# Patient Record
Sex: Male | Born: 1950
Health system: Southern US, Community
[De-identification: ages and names within clinical notes are randomized; demographics above are authoritative.]

## PROBLEM LIST (undated history)

## (undated) DIAGNOSIS — I219 Acute myocardial infarction, unspecified: Secondary | ICD-10-CM

## (undated) DIAGNOSIS — J982 Interstitial emphysema: Secondary | ICD-10-CM

## (undated) DIAGNOSIS — I5022 Chronic systolic (congestive) heart failure: Secondary | ICD-10-CM

## (undated) DIAGNOSIS — E782 Mixed hyperlipidemia: Secondary | ICD-10-CM

## (undated) DIAGNOSIS — J9610 Chronic respiratory failure, unspecified whether with hypoxia or hypercapnia: Secondary | ICD-10-CM

## (undated) DIAGNOSIS — I340 Nonrheumatic mitral (valve) insufficiency: Secondary | ICD-10-CM

## (undated) DIAGNOSIS — I252 Old myocardial infarction: Secondary | ICD-10-CM

## (undated) DIAGNOSIS — E46 Unspecified protein-calorie malnutrition: Secondary | ICD-10-CM

## (undated) DIAGNOSIS — H409 Unspecified glaucoma: Secondary | ICD-10-CM

## (undated) DIAGNOSIS — IMO0001 Reserved for inherently not codable concepts without codable children: Secondary | ICD-10-CM

## (undated) DIAGNOSIS — E871 Hypo-osmolality and hyponatremia: Secondary | ICD-10-CM

## (undated) DIAGNOSIS — C6290 Malignant neoplasm of unspecified testis, unspecified whether descended or undescended: Secondary | ICD-10-CM

## (undated) DIAGNOSIS — I878 Other specified disorders of veins: Secondary | ICD-10-CM

## (undated) DIAGNOSIS — I82409 Acute embolism and thrombosis of unspecified deep veins of unspecified lower extremity: Secondary | ICD-10-CM

## (undated) DIAGNOSIS — J8489 Other specified interstitial pulmonary diseases: Secondary | ICD-10-CM

## (undated) DIAGNOSIS — I2510 Atherosclerotic heart disease of native coronary artery without angina pectoris: Secondary | ICD-10-CM

## (undated) DIAGNOSIS — I700 Atherosclerosis of aorta: Secondary | ICD-10-CM

## (undated) DIAGNOSIS — I517 Cardiomegaly: Secondary | ICD-10-CM

## (undated) DIAGNOSIS — Z8719 Personal history of other diseases of the digestive system: Secondary | ICD-10-CM

## (undated) DIAGNOSIS — J45909 Unspecified asthma, uncomplicated: Secondary | ICD-10-CM

## (undated) DIAGNOSIS — E785 Hyperlipidemia, unspecified: Secondary | ICD-10-CM

## (undated) HISTORY — DX: Atherosclerotic heart disease of native coronary artery without angina pectoris: I25.10

## (undated) HISTORY — DX: Nonrheumatic mitral (valve) insufficiency: I34.0

## (undated) HISTORY — DX: Old myocardial infarction: I25.2

## (undated) HISTORY — PX: CORONARY ARTERY BYPASS GRAFT: SHX141

## (undated) HISTORY — DX: Reserved for inherently not codable concepts without codable children: IMO0001

## (undated) HISTORY — DX: Unspecified glaucoma: H40.9

## (undated) HISTORY — DX: Mixed hyperlipidemia: E78.2

## (undated) HISTORY — DX: Acute myocardial infarction, unspecified: I21.9

## (undated) HISTORY — DX: Atherosclerosis of aorta: I70.0

## (undated) HISTORY — DX: Personal history of other diseases of the digestive system: Z87.19

## (undated) HISTORY — DX: Other specified disorders of veins: I87.8

## (undated) HISTORY — DX: Hyperlipidemia, unspecified: E78.5

## (undated) HISTORY — PX: CHOLECYSTECTOMY: SHX55

## (undated) HISTORY — DX: Cardiomegaly: I51.7

---

## 1986-02-19 HISTORY — PX: VASECTOMY: SHX75

## 1986-09-09 HISTORY — PX: CARDIAC CATHETERIZATION: SHX172

## 2007-12-12 HISTORY — PX: US ECHOCARDIOGRAPHY: HXRAD669

## 2007-12-15 ENCOUNTER — Encounter: Admission: RE | Admit: 2007-12-15 | Discharge: 2007-12-15 | Payer: Self-pay | Admitting: Cardiology

## 2007-12-16 HISTORY — PX: CARDIOVASCULAR STRESS TEST: SHX262

## 2008-12-27 ENCOUNTER — Emergency Department (HOSPITAL_COMMUNITY): Admission: EM | Admit: 2008-12-27 | Discharge: 2008-12-27 | Payer: Self-pay | Admitting: Emergency Medicine

## 2009-07-01 ENCOUNTER — Ambulatory Visit (HOSPITAL_COMMUNITY): Admission: RE | Admit: 2009-07-01 | Discharge: 2009-07-01 | Payer: Self-pay | Admitting: General Surgery

## 2010-05-09 LAB — COMPREHENSIVE METABOLIC PANEL
ALT: 22 U/L (ref 0–53)
Albumin: 3.7 g/dL (ref 3.5–5.2)
CO2: 29 mEq/L (ref 19–32)
Calcium: 8.9 mg/dL (ref 8.4–10.5)
GFR calc Af Amer: >60 mL/min (ref >60)
Total Protein: 7.2 g/dL (ref 6.0–8.3)

## 2010-05-09 LAB — DIFFERENTIAL
Basophils Relative: 1 % (ref 0–1)
Eosinophils Absolute: 0.1 10*3/uL (ref 0.0–0.7)
Lymphs Abs: 1.8 10*3/uL (ref 0.7–4.0)

## 2010-05-09 LAB — CBC
MCV: 86.8 fL (ref 78.0–100.0)
RDW: 14.7 % (ref 11.5–15.5)
WBC: 5 10*3/uL (ref 4.0–10.5)

## 2010-05-24 LAB — COMPREHENSIVE METABOLIC PANEL
ALT: 24 U/L (ref 0–53)
CO2: 26 mEq/L (ref 19–32)
Calcium: 9.3 mg/dL (ref 8.4–10.5)
Chloride: 100 mEq/L (ref 96–112)
GFR calc Af Amer: >60 mL/min (ref >60)
GFR calc non Af Amer: >60 mL/min (ref >60)
Glucose, Bld: 135 mg/dL — ABNORMAL HIGH (ref 70–99)
Potassium: 3.7 mEq/L (ref 3.5–5.1)
Total Bilirubin: 0.7 mg/dL (ref 0.3–1.2)
Total Protein: 7.4 g/dL (ref 6.0–8.3)

## 2010-05-24 LAB — DIFFERENTIAL
Basophils Absolute: 0 10*3/uL (ref 0.0–0.1)
Basophils Relative: 0 % (ref 0–1)
Eosinophils Absolute: 0.1 10*3/uL (ref 0.0–0.7)
Eosinophils Relative: 1 % (ref 0–5)
Lymphocytes Relative: 18 % (ref 12–46)
Lymphs Abs: 1.7 10*3/uL (ref 0.7–4.0)
Monocytes Absolute: 0.6 10*3/uL (ref 0.1–1.0)
Monocytes Relative: 7 % (ref 3–12)
Neutro Abs: 6.8 10*3/uL (ref 1.7–7.7)
Neutrophils Relative %: 74 % (ref 43–77)

## 2010-05-24 LAB — POCT CARDIAC MARKERS
CKMB, poc: 7 ng/mL (ref 1.0–8.0)
Myoglobin, poc: 131 ng/mL (ref 12–200)
Troponin i, poc: <0.05 ng/mL (ref 0.00–0.09)

## 2010-05-24 LAB — CBC
HCT: 37.3 % — ABNORMAL LOW (ref 39.0–52.0)
MCV: 87.5 fL (ref 78.0–100.0)
Platelets: 215 10*3/uL (ref 150–400)
RBC: 4.26 MIL/uL (ref 4.22–5.81)
RDW: 14.6 % (ref 11.5–15.5)
WBC: 9.2 10*3/uL (ref 4.0–10.5)

## 2010-05-24 LAB — LIPASE, BLOOD: Lipase: 23 U/L (ref 11–59)

## 2011-05-10 ENCOUNTER — Encounter: Payer: Self-pay | Admitting: *Deleted

## 2012-07-07 ENCOUNTER — Encounter: Payer: Self-pay | Admitting: Cardiology

## 2012-07-08 ENCOUNTER — Encounter: Payer: Self-pay | Admitting: Cardiology

## 2013-05-15 ENCOUNTER — Institutional Professional Consult (permissible substitution): Payer: Self-pay | Admitting: Cardiovascular Disease

## 2013-05-27 DIAGNOSIS — I517 Cardiomegaly: Secondary | ICD-10-CM | POA: Insufficient documentation

## 2013-05-27 DIAGNOSIS — K8020 Calculus of gallbladder without cholecystitis without obstruction: Secondary | ICD-10-CM

## 2013-05-27 DIAGNOSIS — J45909 Unspecified asthma, uncomplicated: Secondary | ICD-10-CM | POA: Insufficient documentation

## 2013-05-27 DIAGNOSIS — I878 Other specified disorders of veins: Secondary | ICD-10-CM | POA: Insufficient documentation

## 2013-05-27 DIAGNOSIS — E785 Hyperlipidemia, unspecified: Secondary | ICD-10-CM | POA: Insufficient documentation

## 2013-05-27 DIAGNOSIS — E782 Mixed hyperlipidemia: Secondary | ICD-10-CM

## 2013-05-27 DIAGNOSIS — H409 Unspecified glaucoma: Secondary | ICD-10-CM

## 2013-05-27 DIAGNOSIS — I25810 Atherosclerosis of coronary artery bypass graft(s) without angina pectoris: Secondary | ICD-10-CM | POA: Insufficient documentation

## 2013-05-27 DIAGNOSIS — I2109 ST elevation (STEMI) myocardial infarction involving other coronary artery of anterior wall: Secondary | ICD-10-CM | POA: Insufficient documentation

## 2013-06-03 ENCOUNTER — Ambulatory Visit (INDEPENDENT_AMBULATORY_CARE_PROVIDER_SITE_OTHER): Payer: 59 | Admitting: Cardiovascular Disease

## 2013-06-03 ENCOUNTER — Encounter: Payer: Self-pay | Admitting: Cardiovascular Disease

## 2013-06-03 VITALS — BP 110/90 | HR 58 | Ht 69.0 in | Wt 170.1 lb

## 2013-06-03 DIAGNOSIS — I25810 Atherosclerosis of coronary artery bypass graft(s) without angina pectoris: Secondary | ICD-10-CM

## 2013-06-03 DIAGNOSIS — E782 Mixed hyperlipidemia: Secondary | ICD-10-CM

## 2013-06-03 NOTE — Patient Instructions (Signed)
Your physician recommends that you continue on your current medications as directed. Please refer to the Current Medication list given to you today.  Your physician wants you to follow-up in: 1 year with Dr. Nahser.  You will receive a reminder letter in the mail two months in advance. If you don't receive a letter, please call our office to schedule the follow-up appointment.  

## 2013-06-03 NOTE — Assessment & Plan Note (Addendum)
Roberto Ponce is doing OK. No angina.    He had an inferior wall myocardial infarction at age 63. He had coronary artery bypass grafting-bilateral mammary artery grafting at that time. He's done well.  He lost his insurance for a while and therefore did not get medical care for several years. He is now status with Dr. Hulan Fess. He's back on Lipitor and is back eating a good diet. Dr. Rex Kras will continue to monitor his lipids.  I'll see him again in one year.

## 2013-06-03 NOTE — Progress Notes (Signed)
     Roberto Ponce Date of Birth  06-19-1950       [facility] [address] [place], [place], [place]  [address]   [place], [place]  [address] [place]   Fax  [phone]     Fax [phone]  Problem List: 1. Coronary artery disease, CABG ( LIMA to LAD, RIMA to RCA)  - 75 ( age 63) , old Inf. MI prior to CABG.  2. Hyperlipidemia  History of Present Illness:  Roberto Ponce is a 63 yo with hx of CAD, CABG.  He was previously a patient of Dr. Susa Ponce.  He has not had any angina.  Stays on a good diet.   He is now retired .     Current Outpatient Prescriptions on File Prior to Visit  Medication Sig Dispense Refill  . dorzolamide (TRUSOPT) 2 % ophthalmic solution 1 drop 3 (three) times daily.      Marland Kitchen atorvastatin (LIPITOR) 80 MG tablet Take 80 mg by mouth daily.       No current facility-administered medications on file prior to visit.    No Known Allergies  Past Medical History  Diagnosis Date  . Hyperlipidemia   . History of gallstones   . MI, old   . LVH (left ventricular hypertrophy)   . Mitral regurgitation   . Mild aortic sclerosis   . Mixed dyslipidemia   . Coronary artery disease   . MI (myocardial infarction)   . Glaucoma   . Venous stasis     Past Surgical History  Procedure Laterality Date  . Cardiac catheterization  09/09/86    EF 56%  . Coronary artery bypass graft      LIMA TO THE LAD AND RIMA TO RIGHT CORONARY ARTERY  . US echocardiography  12/12/2007    EF 55-60%  . Cardiovascular stress test  12/16/2007    EF 54% NO ISCHEMIA    History  Smoking status  . Never Smoker   Smokeless tobacco  . Not on file    History  Alcohol Use No    Family History  Problem Relation Age of Onset  . Heart attack Father   . Stroke Father     Reviw of Systems:  Reviewed in the HPI.  All other systems are negative.  Physical Exam: Blood pressure 110/90, pulse 58,  height 5\' 9"  (1.753 m), weight 170 lb 1.9 oz (77.166 kg). Wt Readings from Last 3 Encounters:  06/03/13 170 lb 1.9 oz (77.166 kg)     General: Well developed, well nourished, in no acute distress.  Head: Normocephalic, atraumatic, sclera non-icteric, mucus membranes are moist,   Neck: Supple. Carotids are 2 + without bruits. No JVD   Lungs: Clear   Heart: Rr, normal S1, s2  Abdomen: Soft, non-tender, non-distended with normal bowel sounds.  Msk:  Strength and tone are normal   Extremities: No clubbing or cyanosis. No edema.  Distal pedal pulses are 2+ and equal    Neuro: CN II - XII intact.  Alert and oriented X 3.   Psych:  Normal   ECG: June 03, 2013:  Sinus brady at 58,  No ST or T wave changes.   Assessment / Plan:

## 2014-06-21 ENCOUNTER — Ambulatory Visit (INDEPENDENT_AMBULATORY_CARE_PROVIDER_SITE_OTHER): Payer: BLUE CROSS/BLUE SHIELD | Admitting: Cardiovascular Disease

## 2014-06-21 ENCOUNTER — Encounter: Payer: Self-pay | Admitting: Cardiovascular Disease

## 2014-06-21 VITALS — BP 110/78 | HR 70 | Ht 69.0 in | Wt 175.4 lb

## 2014-06-21 DIAGNOSIS — I25810 Atherosclerosis of coronary artery bypass graft(s) without angina pectoris: Secondary | ICD-10-CM

## 2014-06-21 DIAGNOSIS — E782 Mixed hyperlipidemia: Secondary | ICD-10-CM | POA: Diagnosis not present

## 2014-06-21 DIAGNOSIS — I878 Other specified disorders of veins: Secondary | ICD-10-CM | POA: Diagnosis not present

## 2014-06-21 MED ORDER — ASPIRIN EC 81 MG PO TBEC: 81.0000 mg | DELAYED_RELEASE_TABLET | Freq: Every day | ORAL | Status: DC

## 2014-06-21 NOTE — Progress Notes (Signed)
Cardiology Office Note   Date:  06/21/2014   ID:  Roberto Ponce, DOB [birth year], MRN [id number]  PCP:  Gennette Pac, MD  Cardiologist:   Thayer Headings, MD   Chief Complaint  Patient presents with  . Coronary Artery Disease   1. Coronary artery disease, CABG ( LIMA to LAD, RIMA to RCA) - 72 ( age 65) , old Inf. MI prior to CABG.  2. Hyperlipidemia  History of Present Illness:  Roberto Ponce is a 64 yo with hx of CAD, CABG. He was previously a patient of Dr. Susa Simmonds. He has not had any angina. Stays on a good diet. He is now retired .     Jun 21, 2014:    Roberto MCAFFEE is a 64 y.o. male who presents for  His CAD. Stays very active.  Exercises regularly .     Past Medical History  Diagnosis Date  . Hyperlipidemia   . History of gallstones   . MI, old   . LVH (left ventricular hypertrophy)   . Mitral regurgitation   . Mild aortic sclerosis   . Mixed dyslipidemia   . Coronary artery disease   . MI (myocardial infarction)   . Glaucoma   . Venous stasis     Past Surgical History  Procedure Laterality Date  . Cardiac catheterization  09/09/86    EF 56%  . Coronary artery bypass graft      LIMA TO THE LAD AND RIMA TO RIGHT CORONARY ARTERY  . US echocardiography  12/12/2007    EF 55-60%  . Cardiovascular stress test  12/16/2007    EF 54% NO ISCHEMIA     Current Outpatient Prescriptions  Medication Sig Dispense Refill  . aspirin 325 MG tablet Take 325 mg by mouth 3 (three) times a week.     Marland Kitchen atorvastatin (LIPITOR) 80 MG tablet Take 80 mg by mouth daily.    . dorzolamide (TRUSOPT) 2 % ophthalmic solution Place 1 drop into both eyes 2 (two) times daily.     Marland Kitchen latanoprost (XALATAN) 0.005 % ophthalmic solution Place 1 drop into both eyes at bedtime.      No current facility-administered medications for this visit.    Allergies:   Review of patient's allergies indicates no known allergies.    Social History:  The patient  reports that he  has never smoked. He does not have any smokeless tobacco history on file. He reports that he does not drink alcohol or use illicit drugs.   Family History:  The patient's family history includes Heart attack in his father; Stroke in his father.    ROS:  Please see the history of present illness.    Review of Systems: Constitutional:  denies fever, chills, diaphoresis, appetite change and fatigue.  HEENT: denies photophobia, eye pain, redness, hearing loss, ear pain, congestion, sore throat, rhinorrhea, sneezing, neck pain, neck stiffness and tinnitus.  Respiratory: denies SOB, DOE, cough, chest tightness, and wheezing.  Cardiovascular: denies chest pain, palpitations and leg swelling.  Gastrointestinal: denies nausea, vomiting, abdominal pain, diarrhea, constipation, blood in stool.  Genitourinary: denies dysuria, urgency, frequency, hematuria, flank pain and difficulty urinating.  Musculoskeletal: denies  myalgias, back pain, joint swelling, arthralgias and gait problem.   Skin: denies pallor, rash and wound.  Neurological: denies dizziness, seizures, syncope, weakness, light-headedness, numbness and headaches.   Hematological: denies adenopathy, easy bruising, personal or family bleeding history.  Psychiatric/ Behavioral: denies suicidal ideation, mood changes, confusion, nervousness, sleep disturbance and agitation.  All other systems are reviewed and negative.    PHYSICAL EXAM: VS:  BP 110/78 mmHg  Pulse 70  Ht 5\' 9"  (1.753 m)  Wt 175 lb 6.4 oz (79.561 kg)  BMI 25.89 kg/m2 , BMI Body mass index is 25.89 kg/(m^2). GEN: Well nourished, well developed, in no acute distress HEENT: normal Neck: no JVD, carotid bruits, or masses Cardiac: RRR; no murmurs, rubs, or gallops,no edema  Respiratory:  clear to auscultation bilaterally, normal work of breathing GI: soft, nontender, nondistended, + BS MS: no deformity or atrophy Skin: warm and dry, no rash Neuro:  Strength and  sensation are intact Psych: normal   EKG:  EKG is ordered today. The ekg ordered today demonstrates NSR at 70.  Old Inf. MI.  No ST abn   Recent Labs: No results found for requested labs within last 365 days.    Lipid Panel No results found for: CHOL, TRIG, HDL, CHOLHDL, VLDL, LDLCALC, LDLDIRECT    Wt Readings from Last 3 Encounters:  06/21/14 175 lb 6.4 oz (79.561 kg)  06/03/13 170 lb 1.9 oz (77.166 kg)      Other studies Reviewed: Additional studies/ records that were reviewed today include: . Review of the above records demonstrates:    ASSESSMENT AND PLAN:  1. Coronary artery disease, CABG ( LIMA to LAD, RIMA to RCA) - 69 ( age 14) , old Inf. MI prior to CABG. -    He is doing well.  No angina  2. Hyperlipidemia - followed by dr. Rex Kras    Current medicines are reviewed at length with the patient today.  The patient does not have concerns regarding medicines.  The following changes have been made:  no change  Labs/ tests ordered today include:  No orders of the defined types were placed in this encounter.     Disposition:   FU with me in 1 year .      Ponce, Roberto Cheng, MD  06/21/2014 11:02 AM    [facility] [place], [place], [place]  [address] Phone: [phone]; Fax: [phone]   [facility]  [address] [place], [place]  [address] [phone]    Fax [phone]

## 2014-06-21 NOTE — Patient Instructions (Signed)
Medication Instructions:  DECREASE Aspirin to 81 mg once daily  Labwork: None  Testing/Procedures: None  Follow-Up: Your physician wants you to follow-up in: 1 year with Dr. Acie Fredrickson.  You will receive a reminder letter in the mail two months in advance. If you don't receive a letter, please call our office to schedule the follow-up appointment.

## 2014-08-04 ENCOUNTER — Encounter: Payer: Self-pay | Admitting: Cardiovascular Disease

## 2015-03-10 ENCOUNTER — Ambulatory Visit
Admission: RE | Admit: 2015-03-10 | Discharge: 2015-03-10 | Disposition: A | Discharge status: Home / Self Care | Payer: BLUE CROSS/BLUE SHIELD | Source: Ambulatory Visit | Attending: Family Medicine | Admitting: Family Medicine

## 2015-03-10 ENCOUNTER — Ambulatory Visit
Admission: RE | Admit: 2015-03-10 | Discharge: 2015-03-10 | Disposition: A | Discharge status: Home / Self Care | Payer: Self-pay | Source: Ambulatory Visit | Attending: Family Medicine | Admitting: Family Medicine

## 2015-03-10 ENCOUNTER — Other Ambulatory Visit: Payer: Self-pay | Admitting: Family Medicine

## 2015-03-10 DIAGNOSIS — N50811 Right testicular pain: Secondary | ICD-10-CM

## 2015-07-05 ENCOUNTER — Ambulatory Visit (INDEPENDENT_AMBULATORY_CARE_PROVIDER_SITE_OTHER): Payer: BLUE CROSS/BLUE SHIELD | Admitting: Cardiovascular Disease

## 2015-07-05 ENCOUNTER — Encounter: Payer: Self-pay | Admitting: Cardiovascular Disease

## 2015-07-05 VITALS — BP 128/74 | HR 67 | Ht 69.0 in | Wt 177.8 lb

## 2015-07-05 DIAGNOSIS — I25810 Atherosclerosis of coronary artery bypass graft(s) without angina pectoris: Secondary | ICD-10-CM | POA: Diagnosis not present

## 2015-07-05 DIAGNOSIS — E782 Mixed hyperlipidemia: Secondary | ICD-10-CM | POA: Diagnosis not present

## 2015-07-05 NOTE — Progress Notes (Signed)
Cardiology Office Note   Date:  07/05/2015   ID:  [name], DOB 1950/11/26, MRN [id number]  PCP:  Gennette Pac, MD  Cardiologist:   Mertie Moores, MD   Chief Complaint  Patient presents with  . Follow-up  . Coronary Artery Disease   1. Coronary artery disease, CABG ( LIMA to LAD, RIMA to RCA) - 39 ( age 65) , old Inf. MI prior to CABG.  2. Hyperlipidemia  History of Present Illness:  Roberto Ponce is a 65 yo with hx of CAD, CABG. He was previously a patient of Dr. Susa Simmonds. He has not had any angina. Stays on a good diet. He is now retired .     Jun 21, 2014:    Roberto Ponce is a 65 y.o. male who presents for  His CAD. Stays very active.  Exercises regularly .  Jul 05, 2015:  Roberto Ponce is seen today for follow up of his CAD, had CABG in 1988 Garry Heater, MD)  Very active.  Works out on the treadmill regularly . Diet is generally good   No CP or dyspnea.    Past Medical History  Diagnosis Date  . Hyperlipidemia   . History of gallstones   . MI, old   . LVH (left ventricular hypertrophy)   . Mitral regurgitation   . Mild aortic sclerosis ([place])   . Mixed dyslipidemia   . Coronary artery disease   . MI (myocardial infarction) ([place])   . Glaucoma   . Venous stasis     Past Surgical History  Procedure Laterality Date  . Cardiac catheterization  09/09/86    EF 56%  . Coronary artery bypass graft      LIMA TO THE LAD AND RIMA TO RIGHT CORONARY ARTERY  . US echocardiography  12/12/2007    EF 55-60%  . Cardiovascular stress test  12/16/2007    EF 54% NO ISCHEMIA     Current Outpatient Prescriptions  Medication Sig Dispense Refill  . aspirin EC 81 MG tablet Take 1 tablet (81 mg total) by mouth daily.    Marland Kitchen atorvastatin (LIPITOR) 80 MG tablet Take 80 mg by mouth daily.    . dorzolamide (TRUSOPT) 2 % ophthalmic solution Place 1 drop into both eyes 2 (two) times daily.     Marland Kitchen latanoprost (XALATAN) 0.005 % ophthalmic solution Place 1  drop into both eyes at bedtime.     Marland Kitchen oseltamivir (TAMIFLU) 30 MG capsule Take 75 mg by mouth daily.     No current facility-administered medications for this visit.    Allergies:   Review of patient's allergies indicates no known allergies.    Social History:  The patient  reports that he has never smoked. He does not have any smokeless tobacco history on file. He reports that he does not drink alcohol or use illicit drugs.   Family History:  The patient's family history includes Heart attack in his father; Stroke in his father.    ROS:  Please see the history of present illness.    Review of Systems: Constitutional:  denies fever, chills, diaphoresis, appetite change and fatigue.  HEENT: denies photophobia, eye pain, redness, hearing loss, ear pain, congestion, sore throat, rhinorrhea, sneezing, neck pain, neck stiffness and tinnitus.  Respiratory: denies SOB, DOE, cough, chest tightness, and wheezing.  Cardiovascular: denies chest pain, palpitations and leg swelling.  Gastrointestinal: denies nausea, vomiting, abdominal pain, diarrhea, constipation, blood in stool.  Genitourinary: denies dysuria, urgency, frequency, hematuria, flank pain  and difficulty urinating.  Musculoskeletal: denies  myalgias, back pain, joint swelling, arthralgias and gait problem.   Skin: denies pallor, rash and wound.  Neurological: denies dizziness, seizures, syncope, weakness, light-headedness, numbness and headaches.   Hematological: denies adenopathy, easy bruising, personal or family bleeding history.  Psychiatric/ Behavioral: denies suicidal ideation, mood changes, confusion, nervousness, sleep disturbance and agitation.       All other systems are reviewed and negative.    PHYSICAL EXAM: VS:  BP 128/74 mmHg  Pulse 67  Ht 5\' 9"  (1.753 m)  Wt 177 lb 12.8 oz (80.65 kg)  BMI 26.24 kg/m2  SpO2 98% , BMI Body mass index is 26.24 kg/(m^2). GEN: Well nourished, well developed, in no acute  distress HEENT: normal Neck: no JVD, carotid bruits, or masses Cardiac: RRR; no murmurs, rubs, or gallops,no edema  Respiratory:  clear to auscultation bilaterally, normal work of breathing GI: soft, nontender, nondistended, + BS MS: no deformity or atrophy Skin: warm and dry, no rash Neuro:  Strength and sensation are intact Psych: normal   EKG:  EKG is ordered today. The ekg ordered today demonstrates NSR at 70.  Old Inf. MI.  No ST abn   Recent Labs: No results found for requested labs within last 365 days.    Lipid Panel No results found for: CHOL, TRIG, HDL, CHOLHDL, VLDL, LDLCALC, LDLDIRECT    Wt Readings from Last 3 Encounters:  07/05/15 177 lb 12.8 oz (80.65 kg)  06/21/14 175 lb 6.4 oz (79.561 kg)  06/03/13 170 lb 1.9 oz (77.166 kg)      Other studies Reviewed: Additional studies/ records that were reviewed today include: . Review of the above records demonstrates:    ASSESSMENT AND PLAN:  1. Coronary artery disease, CABG ( LIMA to LAD, RIMA to RCA) - 31 ( age 8) , old Inf. MI prior to CABG. -    He is doing well.  No angina  2. Hyperlipidemia - followed by dr. Rex Kras    Current medicines are reviewed at length with the patient today.  The patient does not have concerns regarding medicines.  The following changes have been made:  no change  Labs/ tests ordered today include:  No orders of the defined types were placed in this encounter.     Disposition:   FU with me in 1 year .      Mertie Moores, MD  07/05/2015 11:31 AM    [place], [place], [place]  [address] Phone: [phone]; Fax: [phone]   [facility]  [address] [place], [place]  [address] [phone]    Fax [phone]

## 2015-07-05 NOTE — Patient Instructions (Signed)
Medication Instructions:  Your physician recommends that you continue on your current medications as directed. Please refer to the Current Medication list given to you today.   Labwork: None Ordered   Testing/Procedures: None Ordered   Follow-Up: Your physician wants you to follow-up in: 1 year with Dr. Nahser.  You will receive a reminder letter in the mail two months in advance. If you don't receive a letter, please call our office to schedule the follow-up appointment.   If you need a refill on your cardiac medications before your next appointment, please call your pharmacy.   Thank you for choosing [facility]! Glendy Barsanti, RN [phone]    

## 2015-07-15 NOTE — Addendum Note (Signed)
Addended by: Freada Bergeron on: 07/15/2015 10:25 AM   Modules accepted: Orders

## 2016-04-02 DIAGNOSIS — L308 Other specified dermatitis: Secondary | ICD-10-CM | POA: Diagnosis not present

## 2016-05-21 DIAGNOSIS — H401133 Primary open-angle glaucoma, bilateral, severe stage: Secondary | ICD-10-CM | POA: Diagnosis not present

## 2016-05-21 DIAGNOSIS — H25813 Combined forms of age-related cataract, bilateral: Secondary | ICD-10-CM | POA: Diagnosis not present

## 2016-05-21 DIAGNOSIS — H4423 Degenerative myopia, bilateral: Secondary | ICD-10-CM | POA: Diagnosis not present

## 2016-05-28 DIAGNOSIS — K5730 Diverticulosis of large intestine without perforation or abscess without bleeding: Secondary | ICD-10-CM | POA: Diagnosis not present

## 2016-05-28 DIAGNOSIS — Z1211 Encounter for screening for malignant neoplasm of colon: Secondary | ICD-10-CM | POA: Diagnosis not present

## 2016-06-20 DIAGNOSIS — H2513 Age-related nuclear cataract, bilateral: Secondary | ICD-10-CM | POA: Diagnosis not present

## 2016-06-20 DIAGNOSIS — H401133 Primary open-angle glaucoma, bilateral, severe stage: Secondary | ICD-10-CM | POA: Diagnosis not present

## 2016-07-02 DIAGNOSIS — H401113 Primary open-angle glaucoma, right eye, severe stage: Secondary | ICD-10-CM | POA: Diagnosis not present

## 2016-07-09 ENCOUNTER — Ambulatory Visit (INDEPENDENT_AMBULATORY_CARE_PROVIDER_SITE_OTHER): Payer: PPO | Admitting: Cardiovascular Disease

## 2016-07-09 ENCOUNTER — Encounter: Payer: Self-pay | Admitting: Cardiovascular Disease

## 2016-07-09 ENCOUNTER — Encounter (INDEPENDENT_AMBULATORY_CARE_PROVIDER_SITE_OTHER): Payer: Self-pay

## 2016-07-09 VITALS — BP 108/80 | HR 67 | Ht 69.0 in | Wt 170.4 lb

## 2016-07-09 DIAGNOSIS — E782 Mixed hyperlipidemia: Secondary | ICD-10-CM

## 2016-07-09 DIAGNOSIS — I2510 Atherosclerotic heart disease of native coronary artery without angina pectoris: Secondary | ICD-10-CM | POA: Diagnosis not present

## 2016-07-09 MED ORDER — ROSUVASTATIN CALCIUM 20 MG PO TABS: 20.0000 mg | ORAL_TABLET | Freq: Every day | ORAL | 3 refills | Status: DC

## 2016-07-09 NOTE — Patient Instructions (Addendum)
Medication Instructions:  STOP Atorvastatin (Lipitor) START Rosuvastatin (Crestor) 20 mg once daily   Labwork: None Ordered Please have Dr. Rex Kras fax lab results to Dr. Acie Fredrickson at [phone]   Testing/Procedures: None Ordered   Follow-Up: Your physician wants you to follow-up in: 1 year with Dr. Acie Fredrickson.  You will receive a reminder letter in the mail two months in advance. If you don't receive a letter, please call our office to schedule the follow-up appointment.   If you need a refill on your cardiac medications before your next appointment, please call your pharmacy.   Thank you for choosing [facility]! Christen Bame, RN [phone]

## 2016-07-09 NOTE — Progress Notes (Signed)
Cardiology Office Note   Date:  07/09/2016   ID:  Roberto Ponce, DOB 1950/02/24, MRN [id number]  PCP:  Hulan Fess, MD  Cardiologist:   Mertie Moores, MD   Chief Complaint  Patient presents with  . Coronary Artery Disease   1. Coronary artery disease, CABG ( LIMA to LAD, RIMA to RCA) - 14 ( age 66) , old Inf. MI prior to CABG.  2. Hyperlipidemia  History of Present Illness:  Roberto Ponce is a 66 yo with hx of CAD, CABG. He was previously a patient of Dr. Susa Simmonds. He has not had any angina. Stays on a good diet. He is now retired .     Jun 21, 2014:    Roberto Ponce is a 66 y.o. male who presents for  His CAD. Stays very active.  Exercises regularly .  Jul 05, 2015:  Roberto Ponce is seen today for follow up of his CAD, had CABG in 1988 Garry Heater, MD)  Very active.  Works out on the treadmill regularly . Diet is generally good   No CP or dyspnea.   Jul 09, 2016:  Roberto Ponce is seen today for follow Up of his coronary artery disease. Coronary artery bypass grafting at age 46. Is now retired.  Keeps busy with his 6 grandchildren.   Still exercising regularly , works on the treadmill and "[employer]" Labs have been checked by Hulan Fess, MD   Past Medical History:  Diagnosis Date  . Coronary artery disease   . Glaucoma   . History of gallstones   . Hyperlipidemia   . LVH (left ventricular hypertrophy)   . MI (myocardial infarction) ([place])   . MI, old   . Mild aortic sclerosis ([place])   . Mitral regurgitation   . Mixed dyslipidemia   . Venous stasis     Past Surgical History:  Procedure Laterality Date  . CARDIAC CATHETERIZATION  09/09/86   EF 56%  . CARDIOVASCULAR STRESS TEST  12/16/2007   EF 54% NO ISCHEMIA  . CORONARY ARTERY BYPASS GRAFT     LIMA TO THE LAD AND RIMA TO RIGHT CORONARY ARTERY  . US ECHOCARDIOGRAPHY  12/12/2007   EF 55-60%     Current Outpatient Prescriptions  Medication Sig Dispense Refill  . aspirin EC 81 MG tablet Take  1 tablet (81 mg total) by mouth daily.    Marland Kitchen atorvastatin (LIPITOR) 80 MG tablet Take 80 mg by mouth daily.    . dorzolamide (TRUSOPT) 2 % ophthalmic solution Place 1 drop into both eyes 2 (two) times daily.     Marland Kitchen latanoprost (XALATAN) 0.005 % ophthalmic solution Place 1 drop into both eyes at bedtime.      No current facility-administered medications for this visit.     Allergies:   Patient has no known allergies.    Social History:  The patient  reports that he has never smoked. He has never used smokeless tobacco. He reports that he does not drink alcohol or use drugs.   Family History:  The patient's family history includes Heart attack in his father; Stroke in his father.    ROS:  Please see the history of present illness.    Review of Systems: Constitutional:  denies fever, chills, diaphoresis, appetite change and fatigue.  HEENT: denies photophobia, eye pain, redness, hearing loss, ear pain, congestion, sore throat, rhinorrhea, sneezing, neck pain, neck stiffness and tinnitus.  Respiratory: denies SOB, DOE, cough, chest tightness, and wheezing.  Cardiovascular: denies chest  pain, palpitations and leg swelling.  Gastrointestinal: denies nausea, vomiting, abdominal pain, diarrhea, constipation, blood in stool.  Genitourinary: denies dysuria, urgency, frequency, hematuria, flank pain and difficulty urinating.  Musculoskeletal: denies  myalgias, back pain, joint swelling, arthralgias and gait problem.   Skin: denies pallor, rash and wound.  Neurological: denies dizziness, seizures, syncope, weakness, light-headedness, numbness and headaches.   Hematological: denies adenopathy, easy bruising, personal or family bleeding history.  Psychiatric/ Behavioral: denies suicidal ideation, mood changes, confusion, nervousness, sleep disturbance and agitation.       All other systems are reviewed and negative.    PHYSICAL EXAM: VS:  BP 108/80 (BP Location: Left Arm, Patient Position:  Sitting, Cuff Size: Normal)   Pulse 67   Ht 5\' 9"  (1.753 m)   Wt 170 lb 6.4 oz (77.3 kg)   SpO2 97%   BMI 25.16 kg/m  , BMI Body mass index is 25.16 kg/m. GEN: Well nourished, well developed, in no acute distress  HEENT: normal  Neck: no JVD, carotid bruits, or masses Cardiac: RR; no murmurs, rubs, or gallops,no edema  Respiratory:  Clear  GI:  Soft, + BS  MS: no deformity or atrophy  Skin: no rash Neuro: nonfocal  Psych: normal   EKG:  EKG is ordered today. The ekg ordered today demonstrates NSR at 67.  Old Inf. MI.  No ST abn   Recent Labs: No results found for requested labs within last 8760 hours.    Lipid Panel No results found for: CHOL, TRIG, HDL, CHOLHDL, VLDL, LDLCALC, LDLDIRECT    Wt Readings from Last 3 Encounters:  07/09/16 170 lb 6.4 oz (77.3 kg)  07/05/15 177 lb 12.8 oz (80.6 kg)  06/21/14 175 lb 6.4 oz (79.6 kg)      Other studies Reviewed: Additional studies/ records that were reviewed today include: . Review of the above records demonstrates:    ASSESSMENT AND PLAN:  1. Coronary artery disease, CABG ( LIMA to LAD, RIMA to RCA) - 35 ( age 48) , old Inf. MI prior to CABG. -    He is doing well.  No angina  2. Hyperlipidemia - followed by dr. Rex Kras. His LDL was 105 at his last check in June. Will DC the Atorvastatin and start Rosuvastatin 20 mg a day - this should get him close to his LDL goal of 70. He'll be having labs checked by Dr. Rex Kras in 2 months.    Current medicines are reviewed at length with the patient today.  The patient does not have concerns regarding medicines.  The following changes have been made:  no change  Labs/ tests ordered today include:  No orders of the defined types were placed in this encounter.    Disposition:   FU with me in 1 year .      Mertie Moores, MD  07/09/2016 3:49 PM    [place], [place], [place]  [address] Phone: [phone]; Fax: [phone]

## 2016-08-20 DIAGNOSIS — Z79899 Other long term (current) drug therapy: Secondary | ICD-10-CM | POA: Diagnosis not present

## 2016-08-20 DIAGNOSIS — Z125 Encounter for screening for malignant neoplasm of prostate: Secondary | ICD-10-CM | POA: Diagnosis not present

## 2016-08-20 DIAGNOSIS — E782 Mixed hyperlipidemia: Secondary | ICD-10-CM | POA: Diagnosis not present

## 2016-08-24 DIAGNOSIS — Z23 Encounter for immunization: Secondary | ICD-10-CM | POA: Diagnosis not present

## 2016-08-24 DIAGNOSIS — Z6825 Body mass index (BMI) 25.0-25.9, adult: Secondary | ICD-10-CM | POA: Diagnosis not present

## 2016-08-24 DIAGNOSIS — Z125 Encounter for screening for malignant neoplasm of prostate: Secondary | ICD-10-CM | POA: Diagnosis not present

## 2016-08-24 DIAGNOSIS — N400 Enlarged prostate without lower urinary tract symptoms: Secondary | ICD-10-CM | POA: Diagnosis not present

## 2016-08-24 DIAGNOSIS — E663 Overweight: Secondary | ICD-10-CM | POA: Diagnosis not present

## 2016-08-24 DIAGNOSIS — E782 Mixed hyperlipidemia: Secondary | ICD-10-CM | POA: Diagnosis not present

## 2016-08-24 DIAGNOSIS — H4089 Other specified glaucoma: Secondary | ICD-10-CM | POA: Diagnosis not present

## 2016-08-24 DIAGNOSIS — Z79899 Other long term (current) drug therapy: Secondary | ICD-10-CM | POA: Diagnosis not present

## 2016-08-24 DIAGNOSIS — I2510 Atherosclerotic heart disease of native coronary artery without angina pectoris: Secondary | ICD-10-CM | POA: Diagnosis not present

## 2016-08-24 DIAGNOSIS — Z0000 Encounter for general adult medical examination without abnormal findings: Secondary | ICD-10-CM | POA: Diagnosis not present

## 2016-08-31 ENCOUNTER — Other Ambulatory Visit: Payer: Self-pay

## 2016-08-31 NOTE — Patient Outreach (Signed)
    Patient denies any case management needs at this time. Patient was agreeable to receiving [facility] printed  Through the mail  material describing it's programs.

## 2016-09-25 DIAGNOSIS — I2510 Atherosclerotic heart disease of native coronary artery without angina pectoris: Secondary | ICD-10-CM | POA: Diagnosis not present

## 2016-09-25 DIAGNOSIS — H401113 Primary open-angle glaucoma, right eye, severe stage: Secondary | ICD-10-CM | POA: Diagnosis not present

## 2016-09-25 DIAGNOSIS — I252 Old myocardial infarction: Secondary | ICD-10-CM | POA: Diagnosis not present

## 2016-09-25 DIAGNOSIS — H2511 Age-related nuclear cataract, right eye: Secondary | ICD-10-CM | POA: Diagnosis not present

## 2016-09-25 DIAGNOSIS — H2513 Age-related nuclear cataract, bilateral: Secondary | ICD-10-CM | POA: Diagnosis not present

## 2016-09-25 DIAGNOSIS — Z951 Presence of aortocoronary bypass graft: Secondary | ICD-10-CM | POA: Diagnosis not present

## 2016-09-26 DIAGNOSIS — H401113 Primary open-angle glaucoma, right eye, severe stage: Secondary | ICD-10-CM | POA: Diagnosis not present

## 2016-10-05 DIAGNOSIS — Z79899 Other long term (current) drug therapy: Secondary | ICD-10-CM | POA: Diagnosis not present

## 2016-10-05 DIAGNOSIS — H409 Unspecified glaucoma: Secondary | ICD-10-CM | POA: Diagnosis not present

## 2016-10-05 DIAGNOSIS — H2512 Age-related nuclear cataract, left eye: Secondary | ICD-10-CM | POA: Diagnosis not present

## 2016-10-05 DIAGNOSIS — Z7982 Long term (current) use of aspirin: Secondary | ICD-10-CM | POA: Diagnosis not present

## 2016-10-05 DIAGNOSIS — I252 Old myocardial infarction: Secondary | ICD-10-CM | POA: Diagnosis not present

## 2016-10-05 DIAGNOSIS — H401123 Primary open-angle glaucoma, left eye, severe stage: Secondary | ICD-10-CM | POA: Diagnosis not present

## 2016-10-05 DIAGNOSIS — Z951 Presence of aortocoronary bypass graft: Secondary | ICD-10-CM | POA: Diagnosis not present

## 2016-12-17 DIAGNOSIS — E782 Mixed hyperlipidemia: Secondary | ICD-10-CM | POA: Diagnosis not present

## 2016-12-17 DIAGNOSIS — Z79899 Other long term (current) drug therapy: Secondary | ICD-10-CM | POA: Diagnosis not present

## 2016-12-17 DIAGNOSIS — H4089 Other specified glaucoma: Secondary | ICD-10-CM | POA: Diagnosis not present

## 2016-12-17 DIAGNOSIS — Z125 Encounter for screening for malignant neoplasm of prostate: Secondary | ICD-10-CM | POA: Diagnosis not present

## 2016-12-17 DIAGNOSIS — N400 Enlarged prostate without lower urinary tract symptoms: Secondary | ICD-10-CM | POA: Diagnosis not present

## 2016-12-17 DIAGNOSIS — Z0000 Encounter for general adult medical examination without abnormal findings: Secondary | ICD-10-CM | POA: Diagnosis not present

## 2016-12-17 DIAGNOSIS — I2510 Atherosclerotic heart disease of native coronary artery without angina pectoris: Secondary | ICD-10-CM | POA: Diagnosis not present

## 2017-01-18 DIAGNOSIS — H401113 Primary open-angle glaucoma, right eye, severe stage: Secondary | ICD-10-CM | POA: Diagnosis not present

## 2017-04-01 DIAGNOSIS — H401113 Primary open-angle glaucoma, right eye, severe stage: Secondary | ICD-10-CM | POA: Diagnosis not present

## 2017-04-01 DIAGNOSIS — H401132 Primary open-angle glaucoma, bilateral, moderate stage: Secondary | ICD-10-CM | POA: Diagnosis not present

## 2017-05-22 DIAGNOSIS — Z961 Presence of intraocular lens: Secondary | ICD-10-CM | POA: Diagnosis not present

## 2017-05-22 DIAGNOSIS — H524 Presbyopia: Secondary | ICD-10-CM | POA: Diagnosis not present

## 2017-05-22 DIAGNOSIS — H52223 Regular astigmatism, bilateral: Secondary | ICD-10-CM | POA: Diagnosis not present

## 2017-05-22 DIAGNOSIS — H5201 Hypermetropia, right eye: Secondary | ICD-10-CM | POA: Diagnosis not present

## 2017-05-27 DIAGNOSIS — H401123 Primary open-angle glaucoma, left eye, severe stage: Secondary | ICD-10-CM | POA: Diagnosis not present

## 2017-05-27 DIAGNOSIS — H401113 Primary open-angle glaucoma, right eye, severe stage: Secondary | ICD-10-CM | POA: Diagnosis not present

## 2017-07-09 ENCOUNTER — Encounter: Payer: Self-pay | Admitting: Cardiovascular Disease

## 2017-07-09 ENCOUNTER — Ambulatory Visit: Payer: PPO | Admitting: Cardiovascular Disease

## 2017-07-09 ENCOUNTER — Encounter (INDEPENDENT_AMBULATORY_CARE_PROVIDER_SITE_OTHER): Payer: Self-pay

## 2017-07-09 VITALS — BP 122/85 | HR 67 | Ht 69.0 in | Wt 170.0 lb

## 2017-07-09 DIAGNOSIS — I2510 Atherosclerotic heart disease of native coronary artery without angina pectoris: Secondary | ICD-10-CM | POA: Diagnosis not present

## 2017-07-09 DIAGNOSIS — E782 Mixed hyperlipidemia: Secondary | ICD-10-CM | POA: Diagnosis not present

## 2017-07-09 NOTE — Progress Notes (Signed)
Cardiology Office Note   Date:  07/09/2017   ID:  Roberto Ponce, DOB 09/30/1950, MRN [id number]  PCP:  Hulan Fess, MD  Cardiologist:   Mertie Moores, MD   Chief Complaint  Patient presents with  . Coronary Artery Disease   1. Coronary artery disease, CABG ( LIMA to LAD, RIMA to RCA) - 74 ( age 67) , old Inf. MI prior to CABG.  2. Hyperlipidemia  History of Present Illness:  Roberto Ponce is a 67 yo with hx of CAD, CABG. He was previously a patient of Dr. Susa Simmonds. He has not had any angina. Stays on a good diet. He is now retired .     Jun 21, 2014:    Roberto Ponce is a 67 y.o. male who presents for  His CAD. Stays very active.  Exercises regularly .  Jul 05, 2015:  Roberto Ponce is seen today for follow up of his CAD, had CABG in 1988 Roberto Heater, MD)  Very active.  Works out on the treadmill regularly . Diet is generally good   No CP or dyspnea.   Jul 09, 2016:  Roberto Ponce is seen today for follow Up of his coronary artery disease. Coronary artery bypass grafting at age 67. Is now retired.  Keeps busy with his 6 grandchildren.   Still exercising regularly , works on the treadmill and "total [employer]" Labs have been checked by Hulan Fess, MD   Jul 09, 2017:  Roberto Ponce is seen today for follow up of his CAD . Enjoying retirement . BP is a bit elevated today .   Home readings have also been elevated. Has been eating more salt than he should  Going to the gym - treadmill 4 days a week.    Past Medical History:  Diagnosis Date  . Coronary artery disease   . Glaucoma   . History of gallstones   . Hyperlipidemia   . LVH (left ventricular hypertrophy)   . MI (myocardial infarction) ([place])   . MI, old   . Mild aortic sclerosis ([place])   . Mitral regurgitation   . Mixed dyslipidemia   . Venous stasis     Past Surgical History:  Procedure Laterality Date  . CARDIAC CATHETERIZATION  09/09/86   EF 56%  . CARDIOVASCULAR STRESS TEST  12/16/2007   EF 54% NO  ISCHEMIA  . CORONARY ARTERY BYPASS GRAFT     LIMA TO THE LAD AND RIMA TO RIGHT CORONARY ARTERY  . US ECHOCARDIOGRAPHY  12/12/2007   EF 55-60%     Current Outpatient Medications  Medication Sig Dispense Refill  . aspirin EC 81 MG tablet Take 1 tablet (81 mg total) by mouth daily.    . dorzolamide (TRUSOPT) 2 % ophthalmic solution Place 1 drop into the left eye 2 (two) times daily.     Marland Kitchen latanoprost (XALATAN) 0.005 % ophthalmic solution Place 1 drop into both eyes at bedtime.     . rosuvastatin (CRESTOR) 20 MG tablet Take 1 tablet (20 mg total) by mouth daily. 90 tablet 3   No current facility-administered medications for this visit.     Allergies:   Patient has no known allergies.    Social History:  The patient  reports that he has never smoked. He has never used smokeless tobacco. He reports that he does not drink alcohol or use drugs.   Family History:  The patient's family history includes Heart attack in his father; Stroke in his father.    ROS:  Noted in current history, otherwise review of systems is negative.  Physical Exam: Blood pressure 122/85, pulse 67, height 5\' 9"  (1.753 m), weight 170 lb (77.1 kg), SpO2 97 %.  GEN:  Well nourished, well developed in no acute distress HEENT: Normal NECK: No JVD; No carotid bruits LYMPHATICS: No lymphadenopathy CARDIAC: RRR , no murmurs, rubs, gallops RESPIRATORY:  Clear to auscultation without rales, wheezing or rhonchi  ABDOMEN: Soft, non-tender, non-distended MUSCULOSKELETAL:  No edema; No deformity  SKIN: Warm and dry NEUROLOGIC:  Alert and oriented x 3    EKG:    Jul 09, 2017: Normal sinus rhythm at 67.  Previous inferior wall myocardial infarction.  No changes from previous tracing.  Recent Labs: No results found for requested labs within last 8760 hours.    Lipid Panel No results found for: CHOL, TRIG, HDL, CHOLHDL, VLDL, LDLCALC, LDLDIRECT    Wt Readings from Last 3 Encounters:  07/09/17 170 lb (77.1 kg)    07/09/16 170 lb 6.4 oz (77.3 kg)  07/05/15 177 lb 12.8 oz (80.6 kg)      Other studies Reviewed: Additional studies/ records that were reviewed today include: . Review of the above records demonstrates:    ASSESSMENT AND PLAN:  1. Coronary artery disease, CABG ( LIMA to LAD, RIMA to RCA) - 63 ( age 47) , old Inf. MI prior to CABG. -    No angina .   Works out regularly .     2. Hyperlipidemia -  Has been managed by Dr. Rex Kras  I suggest adding Zetia 10 mg a day or perhaps starting a PCSK9 inhibitor .  His goal LDL is 70.     Current medicines are reviewed at length with the patient today.  The patient does not have concerns regarding medicines.  The following changes have been made:  no change  Labs/ tests ordered today include:   Orders Placed This Encounter  Procedures  . EKG 12-Lead     Disposition:   FU with me in 1 year .      Mertie Moores, MD  07/09/2017 8:32 AM    [address], [place], [place]  [address] Phone: [phone]; Fax: [phone]

## 2017-07-09 NOTE — Patient Instructions (Addendum)
Ask Dr. Rex Kras about adding Zetia 10 mg a day to your current Crestor 20 mg  You will need to get fasting lipid levels, liver enzymes and BMP 3 months after adding Zetia. Your goal LDL is 70 .   Medication Instructions:  Your physician did not prescribe or change any of your medications today. Please refer to the Current Medication list given to you today.   Labwork: None Ordered   Testing/Procedures: None Ordered   Follow-Up: Your physician wants you to follow-up in: 1 year with Dr. Acie Fredrickson. You will receive a reminder letter in the mail two months in advance. If you don't receive a letter, please call our office to schedule the follow-up appointment.   If you need a refill on your cardiac medications before your next appointment, please call your pharmacy.   Thank you for choosing [facility]! Christen Bame, RN [phone]

## 2017-07-10 ENCOUNTER — Telehealth: Payer: Self-pay | Admitting: Nurse Practitioner

## 2017-07-10 DIAGNOSIS — E782 Mixed hyperlipidemia: Secondary | ICD-10-CM

## 2017-07-10 MED ORDER — EZETIMIBE 10 MG PO TABS: 10.0000 mg | ORAL_TABLET | Freq: Every day | ORAL | 3 refills | Status: DC

## 2017-07-10 NOTE — Telephone Encounter (Signed)
Message received from patient requesting #90 Zetia to local pharmacy. Patient is aware to come in August 28 for fasting labs.

## 2017-07-30 ENCOUNTER — Encounter: Payer: Self-pay | Admitting: Cardiovascular Disease

## 2017-08-26 ENCOUNTER — Encounter: Payer: Self-pay | Admitting: Cardiovascular Disease

## 2017-08-26 DIAGNOSIS — Z79899 Other long term (current) drug therapy: Secondary | ICD-10-CM | POA: Diagnosis not present

## 2017-08-26 DIAGNOSIS — E782 Mixed hyperlipidemia: Secondary | ICD-10-CM | POA: Diagnosis not present

## 2017-08-26 DIAGNOSIS — Z125 Encounter for screening for malignant neoplasm of prostate: Secondary | ICD-10-CM | POA: Diagnosis not present

## 2017-08-29 DIAGNOSIS — R011 Cardiac murmur, unspecified: Secondary | ICD-10-CM | POA: Diagnosis not present

## 2017-08-29 DIAGNOSIS — N400 Enlarged prostate without lower urinary tract symptoms: Secondary | ICD-10-CM | POA: Diagnosis not present

## 2017-08-29 DIAGNOSIS — E782 Mixed hyperlipidemia: Secondary | ICD-10-CM | POA: Diagnosis not present

## 2017-08-29 DIAGNOSIS — Z1389 Encounter for screening for other disorder: Secondary | ICD-10-CM | POA: Diagnosis not present

## 2017-08-29 DIAGNOSIS — Z0000 Encounter for general adult medical examination without abnormal findings: Secondary | ICD-10-CM | POA: Diagnosis not present

## 2017-08-29 DIAGNOSIS — Z79899 Other long term (current) drug therapy: Secondary | ICD-10-CM | POA: Diagnosis not present

## 2017-08-29 DIAGNOSIS — Z125 Encounter for screening for malignant neoplasm of prostate: Secondary | ICD-10-CM | POA: Diagnosis not present

## 2017-08-29 DIAGNOSIS — R8290 Unspecified abnormal findings in urine: Secondary | ICD-10-CM | POA: Diagnosis not present

## 2017-08-29 DIAGNOSIS — H4089 Other specified glaucoma: Secondary | ICD-10-CM | POA: Diagnosis not present

## 2017-08-29 DIAGNOSIS — L309 Dermatitis, unspecified: Secondary | ICD-10-CM | POA: Diagnosis not present

## 2017-08-29 DIAGNOSIS — I2510 Atherosclerotic heart disease of native coronary artery without angina pectoris: Secondary | ICD-10-CM | POA: Diagnosis not present

## 2017-10-10 DIAGNOSIS — H401133 Primary open-angle glaucoma, bilateral, severe stage: Secondary | ICD-10-CM | POA: Diagnosis not present

## 2017-10-16 ENCOUNTER — Other Ambulatory Visit: Payer: PPO | Admitting: *Deleted

## 2017-10-16 DIAGNOSIS — E782 Mixed hyperlipidemia: Secondary | ICD-10-CM

## 2017-10-16 LAB — BASIC METABOLIC PANEL
BUN/Creatinine Ratio: 11 (ref 10–24)
BUN: 9 mg/dL (ref 8–27)
CO2: 25 mmol/L (ref 20–29)
Calcium: 9.1 mg/dL (ref 8.6–10.2)
Chloride: 96 mmol/L (ref 96–106)
Creatinine, Ser: 0.83 mg/dL (ref 0.76–1.27)
GFR calc Af Amer: 106 mL/min/{1.73_m2} (ref >59)
GFR, EST NON AFRICAN AMERICAN: 92 mL/min/{1.73_m2} (ref >59)
Glucose: 96 mg/dL (ref 65–99)
POTASSIUM: 4.2 mmol/L (ref 3.5–5.2)
SODIUM: 135 mmol/L (ref 134–144)

## 2017-10-16 LAB — HEPATIC FUNCTION PANEL
ALT: 19 IU/L (ref 0–44)
AST: 24 IU/L (ref 0–40)
Albumin: 4.3 g/dL (ref 3.6–4.8)
Alkaline Phosphatase: 62 IU/L (ref 39–117)
BILIRUBIN TOTAL: 0.4 mg/dL (ref 0.0–1.2)
Bilirubin, Direct: 0.13 mg/dL (ref 0.00–0.40)
Total Protein: 7.2 g/dL (ref 6.0–8.5)

## 2017-10-16 LAB — LIPID PANEL
CHOLESTEROL TOTAL: 129 mg/dL (ref 100–199)
Chol/HDL Ratio: 3.8 ratio (ref 0.0–5.0)
HDL: 34 mg/dL — ABNORMAL LOW (ref >39)
LDL CALC: 80 mg/dL (ref 0–99)
TRIGLYCERIDES: 75 mg/dL (ref 0–149)
VLDL CHOLESTEROL CAL: 15 mg/dL (ref 5–40)

## 2017-10-28 DIAGNOSIS — H401133 Primary open-angle glaucoma, bilateral, severe stage: Secondary | ICD-10-CM | POA: Diagnosis not present

## 2017-11-14 DIAGNOSIS — L249 Irritant contact dermatitis, unspecified cause: Secondary | ICD-10-CM | POA: Diagnosis not present

## 2017-11-14 DIAGNOSIS — B359 Dermatophytosis, unspecified: Secondary | ICD-10-CM | POA: Diagnosis not present

## 2017-11-18 DIAGNOSIS — H401133 Primary open-angle glaucoma, bilateral, severe stage: Secondary | ICD-10-CM | POA: Diagnosis not present

## 2018-02-17 DIAGNOSIS — H401133 Primary open-angle glaucoma, bilateral, severe stage: Secondary | ICD-10-CM | POA: Diagnosis not present

## 2018-02-24 DIAGNOSIS — J0100 Acute maxillary sinusitis, unspecified: Secondary | ICD-10-CM | POA: Diagnosis not present

## 2018-05-19 DIAGNOSIS — H401133 Primary open-angle glaucoma, bilateral, severe stage: Secondary | ICD-10-CM | POA: Diagnosis not present

## 2018-06-14 ENCOUNTER — Other Ambulatory Visit: Payer: Self-pay | Admitting: Cardiovascular Disease

## 2018-06-27 ENCOUNTER — Telehealth: Payer: Self-pay

## 2018-06-27 NOTE — Telephone Encounter (Signed)
Pt has given verbal consent for phone visit for his appt. Pt has been advised to have his BP, HR, and weight ready for his appt.    YOUR CARDIOLOGY TEAM HAS ARRANGED FOR AN E-VISIT FOR YOUR APPOINTMENT - PLEASE REVIEW IMPORTANT INFORMATION BELOW SEVERAL DAYS PRIOR TO YOUR APPOINTMENT  Due to the recent COVID-19 pandemic, we are transitioning in-person office visits to tele-medicine visits in an effort to decrease unnecessary exposure to our patients and staff. Medicare and most insurances are covering these visits without a copay needed. You will need a working email and a smartphone or computer with a camera and microphone. For patients that do not have these items, we can still complete the visit using a telephone but do prefer video when possible. If possible, we also ask that you have a blood pressure cuff and scale at home to measure your blood pressure, heart rate and weight prior to your scheduled appointment. Patients with clinical needs that need an in-person evaluation and testing will still be able to come to the office if absolutely necessary. If you have any questions, feel free to call our office.     DOWNLOADING THE SOFTWARE  Download the [employer] app to enable video and telephone visits with your [facility] Provider.   Instructions for downloading [employer] WebEx: - Go to https://www.webex.com/downloads.html and follow the instructions, or download the app on your smartphone [facility] [employer] Meetings). - If you have technical difficulties with downloading WebEx, please call WebEx at [phone]. - Once the app is downloaded (can be done on either mobile or desktop computer), go to Settings in the upper left hand corner.  Be sure that camera and audio are enabled.  - You will receive an email message with a link to the meeting with a time to join for your tele-health visit.  - Please download the app and have settings configured prior to the appointment time.      2-3 DAYS  BEFORE YOUR APPOINTMENT  One of our staff will call you to confirm that you have been able to set up your WebEx account. We will remind you check your blood pressure, heart rate and weight prior to your scheduled appointment. If you have an Apple Watch or Kardia, please upload any pertinent ECG strips the day before or morning of your appointment to [place]. Our staff will also make sure you have reviewed the consent and agree to move forward with your scheduled tele-health visit.    THE DAY OF YOUR APPOINTMENT  Approximately 15-20 minutes prior to your scheduled appointment, you will receive an e-mail directly from one of our staff member's [email or web address] e-mail accounts inviting you to join a WebEx meeting.  Please do not reply to that email - simply join the [employer].  Upon joining, a member of the office staff will speak with you initially through the WebEx platform to confirm medications, vital signs for the day and any symptoms you may be experiencing.  Please have this information available prior to the time of visit start.      CONSENT FOR TELE-HEALTH VISIT - PLEASE RVIEW  I hereby voluntarily request, consent and authorize [facility] and its employed or contracted physicians, physician assistants, nurse practitioners or other licensed health care professionals (the Practitioner), to provide me with telemedicine health care services (the "Services") as deemed necessary by the treating Practitioner. I acknowledge and consent to receive the Services by the Practitioner via telemedicine. I understand that the telemedicine visit will involve  communicating with the Practitioner through live audiovisual communication technology and the disclosure of certain medical information by electronic transmission. I acknowledge that I have been given the opportunity to request an in-person assessment or other available alternative prior to the telemedicine visit and am voluntarily participating in  the telemedicine visit.  I understand that I have the right to withhold or withdraw my consent to the use of telemedicine in the course of my care at any time, without affecting my right to future care or treatment, and that the Practitioner or I may terminate the telemedicine visit at any time. I understand that I have the right to inspect all information obtained and/or recorded in the course of the telemedicine visit and may receive copies of available information for a reasonable fee.  I understand that some of the potential risks of receiving the Services via telemedicine include:  Marland Kitchen Delay or interruption in medical evaluation due to technological equipment failure or disruption; . Information transmitted may not be sufficient (e.g. poor resolution of images) to allow for appropriate medical decision making by the Practitioner; and/or  . In rare instances, security protocols could fail, causing a breach of personal health information.  Furthermore, I acknowledge that it is my responsibility to provide information about my medical history, conditions and care that is complete and accurate to the best of my ability. I acknowledge that Practitioner's advice, recommendations, and/or decision may be based on factors not within their control, such as incomplete or inaccurate data provided by me or distortions of diagnostic images or specimens that may result from electronic transmissions. I understand that the practice of medicine is not an exact science and that Practitioner makes no warranties or guarantees regarding treatment outcomes. I acknowledge that I will receive a copy of this consent concurrently upon execution via email to the email address I last provided but may also request a printed copy by calling the office of [facility].    I understand that my insurance will be billed for this visit.   I have read or had this consent read to me. . I understand the contents of this consent, which  adequately explains the benefits and risks of the Services being provided via telemedicine.  . I have been provided ample opportunity to ask questions regarding this consent and the Services and have had my questions answered to my satisfaction. . I give my informed consent for the services to be provided through the use of telemedicine in my medical care  By participating in this telemedicine visit I agree to the above.

## 2018-07-09 ENCOUNTER — Other Ambulatory Visit: Payer: Self-pay

## 2018-07-09 ENCOUNTER — Encounter: Payer: Self-pay | Admitting: Cardiovascular Disease

## 2018-07-09 ENCOUNTER — Telehealth (INDEPENDENT_AMBULATORY_CARE_PROVIDER_SITE_OTHER): Payer: PPO | Admitting: Cardiovascular Disease

## 2018-07-09 VITALS — BP 124/74 | HR 65 | Ht 69.5 in | Wt 170.0 lb

## 2018-07-09 DIAGNOSIS — I25810 Atherosclerosis of coronary artery bypass graft(s) without angina pectoris: Secondary | ICD-10-CM

## 2018-07-09 DIAGNOSIS — E782 Mixed hyperlipidemia: Secondary | ICD-10-CM | POA: Diagnosis not present

## 2018-07-09 DIAGNOSIS — Z7189 Other specified counseling: Secondary | ICD-10-CM | POA: Diagnosis not present

## 2018-07-09 NOTE — Patient Instructions (Signed)
Medication Instructions:  Your physician recommends that you continue on your current medications as directed. Please refer to the Current Medication list given to you today.  If you need a refill on your cardiac medications before your next appointment, please call your pharmacy.   Lab work: Your physician recommends that you return for lab work in: May 2021.  Week or so prior to appointment with Dr. Acie Fredrickson.  This will be fasting lab work--BMP, Lipid and liver profiles  If you have labs (blood work) drawn today and your tests are completely normal, you will receive your results only by: Marland Kitchen MyChart Message (if you have MyChart) OR . A paper copy in the mail If you have any lab test that is abnormal or we need to change your treatment, we will call you to review the results.  Testing/Procedures: none  Follow-Up: At [facility], you and your health needs are our priority.  As part of our continuing mission to provide you with exceptional heart care, we have created designated Provider Care Teams.  These Care Teams include your primary Cardiologist (physician) and Advanced Practice Providers (APPs -  Physician Assistants and Nurse Practitioners) who all work together to provide you with the care you need, when you need it. You will need a follow up appointment in:  12 months.  Please call our office 2 months in advance to schedule this appointment.  You may see Mertie Moores, MD or one of the following Advanced Practice Providers on your designated Care Team: Richardson Dopp, PA-C [place], [place] . Daune Perch, NP  Any Other Special Instructions Will Be Listed Below (If Applicable).

## 2018-07-09 NOTE — Progress Notes (Signed)
Virtual Visit via Telephone Note   This visit type was conducted due to national recommendations for restrictions regarding the COVID-19 Pandemic (e.g. social distancing) in an effort to limit this patient's exposure and mitigate transmission in our community.  Due to his co-morbid illnesses, this patient is at least at moderate risk for complications without adequate follow up.  This format is felt to be most appropriate for this patient at this time.  The patient did not have access to video technology/had technical difficulties with video requiring transitioning to audio format only (telephone).  All issues noted in this document were discussed and addressed.  No physical exam could be performed with this format.  Please refer to the patient's chart for his  consent to telehealth for [facility].   Date:  07/09/2018   ID:  Roberto Ponce, DOB 10/02/1950, MRN [id number]  Patient Location: Home Provider Location: Home  PCP:  Hulan Fess, MD  Cardiologist:  Mertie Moores, MD  Electrophysiologist:  None   Evaluation Performed:  Follow-Up Visit  Problem List   1. Coronary artery disease, CABG ( LIMA to LAD, RIMA to RCA) - 31 ( age 88) , old Inf. MI prior to CABG.  2. Hyperlipidemia   Roberto Ponce is a 68 yo with hx of CAD, CABG. He was previously a patient of Dr. Susa Simmonds. He has not had any angina. Stays on a good diet. He is now retired .     Jun 21, 2014:    Roberto Ponce is a 68 y.o. male who presents for  His CAD. Stays very active.  Exercises regularly .  Jul 05, 2015:  Roberto Ponce is seen today for follow up of his CAD, had CABG in 1988 Roberto Heater, MD)  Very active.  Works out on the treadmill regularly . Diet is generally good   No CP or dyspnea.   Jul 09, 2016:  Roberto Ponce is seen today for follow Up of his coronary artery disease. Coronary artery bypass grafting at age 36. Is now retired.  Keeps busy with his 6 grandchildren.   Still  exercising regularly , works on the treadmill and "total Gym" Labs have been checked by Hulan Fess, MD   Jul 09, 2017:  Roberto Ponce is seen today for follow up of his CAD . Enjoying retirement . BP is a bit elevated today .   Home readings have also been elevated. Has been eating more salt than he should  Going to the gym - treadmill 4 days a week.     May 20 , 2020    Chief Complaint:  Follow up CAD    Roberto Ponce is a 68 y.o. male with  CAD.  Doing well.  No angina  Bought a new treadmill last week.   Still working out regularly .  No CP or dyspnea, no syncope No  N,V,D. No signs of symptoms of covid   Had URI in Nov. 2019.   Is better now  Last lab work rom Aug, 2019 shows  LDL of 80,   Total chol = 129 HDL = 34 Trigs = 75    Wears a mask when he goes out.  Social distancing  The patient does not have symptoms concerning for COVID-19 infection (fever, chills, cough, or new shortness of breath).    Past Medical History:  Diagnosis Date  . Coronary artery disease   . Glaucoma   . History of gallstones   . Hyperlipidemia   . LVH (  left ventricular hypertrophy)   . MI (myocardial infarction) ([place])   . MI, old   . Mild aortic sclerosis ([place])   . Mitral regurgitation   . Mixed dyslipidemia   . Venous stasis    Past Surgical History:  Procedure Laterality Date  . CARDIAC CATHETERIZATION  09/09/86   EF 56%  . CARDIOVASCULAR STRESS TEST  12/16/2007   EF 54% NO ISCHEMIA  . CORONARY ARTERY BYPASS GRAFT     LIMA TO THE LAD AND RIMA TO RIGHT CORONARY ARTERY  . US ECHOCARDIOGRAPHY  12/12/2007   EF 55-60%     Current Meds  Medication Sig  . aspirin EC 81 MG tablet Take 1 tablet (81 mg total) by mouth daily.  . dorzolamide (TRUSOPT) 2 % ophthalmic solution Place 1 drop into both eyes 2 (two) times daily.   Marland Kitchen ezetimibe (ZETIA) 10 MG tablet TAKE 1 TABLET(10 MG) BY MOUTH DAILY  . latanoprost (XALATAN) 0.005 % ophthalmic solution Place 1 drop into both eyes at  bedtime.   . rosuvastatin (CRESTOR) 20 MG tablet Take 1 tablet (20 mg total) by mouth daily.  Marland Kitchen triamcinolone cream (KENALOG) 0.1 % Apply 1 application topically as needed.     Allergies:   Patient has no known allergies.   Social History   Tobacco Use  . Smoking status: Never Smoker  . Smokeless tobacco: Never Used  Substance Use Topics  . Alcohol use: No  . Drug use: No     Family Hx: The patient's family history includes Heart attack in his father; Stroke in his father.  ROS:   Please see the history of present illness.     All other systems reviewed and are negative.   Prior CV studies:   The following studies were reviewed today:    Labs/Other Tests and Data Reviewed:    EKG:  No ECG reviewed.  Recent Labs: 10/16/2017: ALT 19; BUN 9; Creatinine, Ser 0.83; Potassium 4.2; Sodium 135   Recent Lipid Panel Lab Results  Component Value Date/Time   CHOL 129 10/16/2017 07:42 AM   TRIG 75 10/16/2017 07:42 AM   HDL 34 (L) 10/16/2017 07:42 AM   CHOLHDL 3.8 10/16/2017 07:42 AM   LDLCALC 80 10/16/2017 07:42 AM    Wt Readings from Last 3 Encounters:  07/09/18 170 lb (77.1 kg)  07/09/17 170 lb (77.1 kg)  07/09/16 170 lb 6.4 oz (77.3 kg)     Objective:    Vital Signs:  BP 124/74 (BP Location: Left Arm, Patient Position: Sitting, Cuff Size: Normal)   Pulse 65   Ht 5' 9.5" (1.765 m)   Wt 170 lb (77.1 kg)   BMI 24.74 kg/m    No exam except for VS   ASSESSMENT & PLAN:    1.  CAD:  Doing well,  Exercising regularly .   Tries to eat well.   No angina, no dyspnea or syncope  2.   Hyperlipidemia:    Cont crestor and fenofibrate.  Last labs look great.   COVID-19 Education: The signs and symptoms of COVID-19 were discussed with the patient and how to seek care for testing (follow up with PCP or arrange E-visit).  The importance of social distancing was discussed today.  Time:   Today, I have spent  20  minutes with the patient with telehealth technology  discussing the above problems.     Medication Adjustments/Labs and Tests Ordered: Current medicines are reviewed at length with the patient today.  Concerns regarding medicines  are outlined above.   Tests Ordered: No orders of the defined types were placed in this encounter.   Medication Changes: No orders of the defined types were placed in this encounter.   Disposition:  Follow up in 1 year(s)  Signed, Mertie Moores, MD  07/09/2018 9:47 AM    [facility]

## 2018-08-14 DIAGNOSIS — H401133 Primary open-angle glaucoma, bilateral, severe stage: Secondary | ICD-10-CM | POA: Diagnosis not present

## 2018-08-25 DIAGNOSIS — Z79899 Other long term (current) drug therapy: Secondary | ICD-10-CM | POA: Diagnosis not present

## 2018-08-25 DIAGNOSIS — Z125 Encounter for screening for malignant neoplasm of prostate: Secondary | ICD-10-CM | POA: Diagnosis not present

## 2018-08-25 DIAGNOSIS — E782 Mixed hyperlipidemia: Secondary | ICD-10-CM | POA: Diagnosis not present

## 2018-09-05 DIAGNOSIS — Z0000 Encounter for general adult medical examination without abnormal findings: Secondary | ICD-10-CM | POA: Diagnosis not present

## 2018-09-10 DIAGNOSIS — E782 Mixed hyperlipidemia: Secondary | ICD-10-CM | POA: Diagnosis not present

## 2018-09-10 DIAGNOSIS — I2510 Atherosclerotic heart disease of native coronary artery without angina pectoris: Secondary | ICD-10-CM | POA: Diagnosis not present

## 2018-09-10 DIAGNOSIS — E871 Hypo-osmolality and hyponatremia: Secondary | ICD-10-CM | POA: Diagnosis not present

## 2018-09-10 DIAGNOSIS — N400 Enlarged prostate without lower urinary tract symptoms: Secondary | ICD-10-CM | POA: Diagnosis not present

## 2018-09-10 DIAGNOSIS — H4089 Other specified glaucoma: Secondary | ICD-10-CM | POA: Diagnosis not present

## 2018-09-10 DIAGNOSIS — L309 Dermatitis, unspecified: Secondary | ICD-10-CM | POA: Diagnosis not present

## 2018-10-08 DIAGNOSIS — E782 Mixed hyperlipidemia: Secondary | ICD-10-CM | POA: Diagnosis not present

## 2018-10-08 DIAGNOSIS — H4089 Other specified glaucoma: Secondary | ICD-10-CM | POA: Diagnosis not present

## 2018-10-08 DIAGNOSIS — E871 Hypo-osmolality and hyponatremia: Secondary | ICD-10-CM | POA: Diagnosis not present

## 2018-10-08 DIAGNOSIS — L309 Dermatitis, unspecified: Secondary | ICD-10-CM | POA: Diagnosis not present

## 2018-10-08 DIAGNOSIS — I2510 Atherosclerotic heart disease of native coronary artery without angina pectoris: Secondary | ICD-10-CM | POA: Diagnosis not present

## 2018-10-08 DIAGNOSIS — N400 Enlarged prostate without lower urinary tract symptoms: Secondary | ICD-10-CM | POA: Diagnosis not present

## 2018-12-12 ENCOUNTER — Other Ambulatory Visit: Payer: Self-pay | Admitting: Cardiovascular Disease

## 2018-12-17 DIAGNOSIS — H401133 Primary open-angle glaucoma, bilateral, severe stage: Secondary | ICD-10-CM | POA: Diagnosis not present

## 2019-04-16 ENCOUNTER — Other Ambulatory Visit: Payer: Self-pay | Admitting: Pharmacist

## 2019-04-16 DIAGNOSIS — E782 Mixed hyperlipidemia: Secondary | ICD-10-CM | POA: Diagnosis not present

## 2019-04-16 DIAGNOSIS — N400 Enlarged prostate without lower urinary tract symptoms: Secondary | ICD-10-CM | POA: Diagnosis not present

## 2019-04-16 DIAGNOSIS — H4089 Other specified glaucoma: Secondary | ICD-10-CM | POA: Diagnosis not present

## 2019-04-16 DIAGNOSIS — I2510 Atherosclerotic heart disease of native coronary artery without angina pectoris: Secondary | ICD-10-CM | POA: Diagnosis not present

## 2019-04-16 MED ORDER — EZETIMIBE 10 MG PO TABS: ORAL_TABLET | ORAL | 1 refills | Status: DC

## 2019-04-24 DIAGNOSIS — H401133 Primary open-angle glaucoma, bilateral, severe stage: Secondary | ICD-10-CM | POA: Diagnosis not present

## 2019-06-10 DIAGNOSIS — B029 Zoster without complications: Secondary | ICD-10-CM | POA: Diagnosis not present

## 2019-06-11 DIAGNOSIS — H401133 Primary open-angle glaucoma, bilateral, severe stage: Secondary | ICD-10-CM | POA: Diagnosis not present

## 2019-06-17 DIAGNOSIS — N400 Enlarged prostate without lower urinary tract symptoms: Secondary | ICD-10-CM | POA: Diagnosis not present

## 2019-06-17 DIAGNOSIS — E782 Mixed hyperlipidemia: Secondary | ICD-10-CM | POA: Diagnosis not present

## 2019-06-17 DIAGNOSIS — H4089 Other specified glaucoma: Secondary | ICD-10-CM | POA: Diagnosis not present

## 2019-06-17 DIAGNOSIS — I2510 Atherosclerotic heart disease of native coronary artery without angina pectoris: Secondary | ICD-10-CM | POA: Diagnosis not present

## 2019-07-09 ENCOUNTER — Other Ambulatory Visit: Payer: Self-pay

## 2019-07-09 ENCOUNTER — Other Ambulatory Visit: Payer: PPO | Admitting: *Deleted

## 2019-07-09 DIAGNOSIS — I25810 Atherosclerosis of coronary artery bypass graft(s) without angina pectoris: Secondary | ICD-10-CM

## 2019-07-09 DIAGNOSIS — E782 Mixed hyperlipidemia: Secondary | ICD-10-CM | POA: Diagnosis not present

## 2019-07-09 LAB — HEPATIC FUNCTION PANEL
ALT: 27 IU/L (ref 0–44)
AST: 34 IU/L (ref 0–40)
Albumin: 4.5 g/dL (ref 3.8–4.8)
Alkaline Phosphatase: 68 IU/L (ref 48–121)
Bilirubin Total: 0.3 mg/dL (ref 0.0–1.2)
Bilirubin, Direct: 0.11 mg/dL (ref 0.00–0.40)
Total Protein: 7.6 g/dL (ref 6.0–8.5)

## 2019-07-09 LAB — BASIC METABOLIC PANEL
BUN/Creatinine Ratio: 12 (ref 10–24)
BUN: 10 mg/dL (ref 8–27)
CO2: 23 mmol/L (ref 20–29)
Calcium: 9.4 mg/dL (ref 8.6–10.2)
Chloride: 97 mmol/L (ref 96–106)
Creatinine, Ser: 0.85 mg/dL (ref 0.76–1.27)
GFR calc Af Amer: 103 mL/min/{1.73_m2} (ref >59)
GFR calc non Af Amer: 90 mL/min/{1.73_m2} (ref >59)
Glucose: 97 mg/dL (ref 65–99)
Potassium: 4.3 mmol/L (ref 3.5–5.2)
Sodium: 136 mmol/L (ref 134–144)

## 2019-07-09 LAB — LIPID PANEL
Chol/HDL Ratio: 3.7 ratio (ref 0.0–5.0)
Cholesterol, Total: 133 mg/dL (ref 100–199)
HDL: 36 mg/dL — ABNORMAL LOW (ref >39)
LDL Chol Calc (NIH): 83 mg/dL (ref 0–99)
Triglycerides: 67 mg/dL (ref 0–149)
VLDL Cholesterol Cal: 14 mg/dL (ref 5–40)

## 2019-07-13 ENCOUNTER — Encounter: Payer: Self-pay | Admitting: Cardiovascular Disease

## 2019-07-13 ENCOUNTER — Ambulatory Visit: Payer: PPO | Admitting: Cardiovascular Disease

## 2019-07-13 ENCOUNTER — Other Ambulatory Visit: Payer: Self-pay

## 2019-07-13 VITALS — BP 134/82 | HR 62 | Ht 69.0 in | Wt 171.4 lb

## 2019-07-13 DIAGNOSIS — I25810 Atherosclerosis of coronary artery bypass graft(s) without angina pectoris: Secondary | ICD-10-CM

## 2019-07-13 DIAGNOSIS — I2510 Atherosclerotic heart disease of native coronary artery without angina pectoris: Secondary | ICD-10-CM | POA: Diagnosis not present

## 2019-07-13 DIAGNOSIS — E782 Mixed hyperlipidemia: Secondary | ICD-10-CM | POA: Diagnosis not present

## 2019-07-13 NOTE — Progress Notes (Signed)
Cardiology Office Note   Date:  07/13/2019   ID:  Roberto Ponce, DOB Dec 30, 1950, MRN [id number]  PCP:  Hulan Fess, MD  Cardiologist:   Mertie Moores, MD   Chief Complaint  Patient presents with  . Coronary Artery Disease  . Hyperlipidemia   1. Coronary artery disease, CABG ( LIMA to LAD, RIMA to RCA) - 68 ( age 69) , old Inf. MI prior to CABG.  2. Hyperlipidemia  History of Present Illness:  Roberto Ponce is a 69 yo with hx of CAD, CABG. He was previously a patient of Dr. Susa Simmonds. He has not had any angina. Stays on a good diet. He is now retired .     Jun 21, 2014:    Roberto Ponce is a 69 y.o. male who presents for  His CAD. Stays very active.  Exercises regularly .  Jul 05, 2015:  Roberto Ponce is seen today for follow up of his CAD, had CABG in 1988 Roberto Heater, MD)  Very active.  Works out on the treadmill regularly . Diet is generally good   No CP or dyspnea.   Jul 09, 2016:  Roberto Ponce is seen today for follow Up of his coronary artery disease. Coronary artery bypass grafting at age 36. Is now retired.  Keeps busy with his 6 grandchildren.   Still exercising regularly , works on the treadmill and "total [employer]" Labs have been checked by Hulan Fess, MD   Jul 09, 2017:  Roberto Ponce is seen today for follow up of his CAD . Enjoying retirement . BP is a bit elevated today .   Home readings have also been elevated. Has been eating more salt than he should  Going to the gym - treadmill 4 days a week.    Jul 13, 2019: Roberto Ponce is seen today for follow-up of his coronary artery disease. Had shingles last month.  Feeling better now.  Is back exercising .  No CP or dyspnea Labs were reviewed..  Chol levels look ok . LDL is 83.   On rosuvastatin 40 and zetia 10 mg a day   Past Medical History:  Diagnosis Date  . Coronary artery disease   . Glaucoma   . History of gallstones   . Hyperlipidemia   . LVH (left ventricular hypertrophy)   . MI (myocardial  infarction) ([place])   . MI, old   . Mild aortic sclerosis   . Mitral regurgitation   . Mixed dyslipidemia   . Venous stasis     Past Surgical History:  Procedure Laterality Date  . CARDIAC CATHETERIZATION  09/09/86   EF 56%  . CARDIOVASCULAR STRESS TEST  12/16/2007   EF 54% NO ISCHEMIA  . CORONARY ARTERY BYPASS GRAFT     LIMA TO THE LAD AND RIMA TO RIGHT CORONARY ARTERY  . US ECHOCARDIOGRAPHY  12/12/2007   EF 55-60%     Current Outpatient Medications  Medication Sig Dispense Refill  . aspirin EC 81 MG tablet Take 1 tablet (81 mg total) by mouth daily.    . dorzolamide (TRUSOPT) 2 % ophthalmic solution Place 1 drop into both eyes 2 (two) times daily.     Marland Kitchen ezetimibe (ZETIA) 10 MG tablet TAKE 1 TABLET(10 MG) BY MOUTH DAILY 90 tablet 1  . latanoprost (XALATAN) 0.005 % ophthalmic solution Place 1 drop into both eyes at bedtime.     . rosuvastatin (CRESTOR) 40 MG tablet Take 40 mg by mouth at bedtime.    . triamcinolone cream (  KENALOG) 0.1 % Apply 1 application topically as needed.     No current facility-administered medications for this visit.    Allergies:   Patient has no known allergies.    Social History:  The patient  reports that he has never smoked. He has never used smokeless tobacco. He reports that he does not drink alcohol or use drugs.   Family History:  The patient's family history includes Heart attack in his father; Stroke in his father.    ROS:   Noted in current history, otherwise review of systems is negative.  Physical Exam: Blood pressure 134/82, pulse 62, height 5\' 9"  (1.753 m), weight 171 lb 6.4 oz (77.7 kg), SpO2 97 %.  GEN:  Well nourished, well developed in no acute distress HEENT: Normal NECK: No JVD; No carotid bruits LYMPHATICS: No lymphadenopathy CARDIAC: RR,  Brief, mid systolic murmur  RESPIRATORY:  Clear to auscultation without rales, wheezing or rhonchi  ABDOMEN: Soft, non-tender, non-distended MUSCULOSKELETAL:  No edema; No deformity   SKIN: Warm and dry NEUROLOGIC:  Alert and oriented x 3    EKG:    Normal sinus rhythm at 62.  Old inferior wall myocardial infarction.  No acute ST or T wave changes.  Recent Labs: 07/09/2019: ALT 27; BUN 10; Creatinine, Ser 0.85; Potassium 4.3; Sodium 136    Lipid Panel    Component Value Date/Time   CHOL 133 07/09/2019 0730   TRIG 67 07/09/2019 0730   HDL 36 (L) 07/09/2019 0730   CHOLHDL 3.7 07/09/2019 0730   LDLCALC 83 07/09/2019 0730      Wt Readings from Last 3 Encounters:  07/13/19 171 lb 6.4 oz (77.7 kg)  07/09/18 170 lb (77.1 kg)  07/09/17 170 lb (77.1 kg)      Other studies Reviewed: Additional studies/ records that were reviewed today include: . Review of the above records demonstrates:    ASSESSMENT AND PLAN:  1. Coronary artery disease, CABG ( LIMA to LAD, RIMA to RCA) - 68 ( age 69) ,  . No angina   on rosuvastatin and zetia . Exercises without any angina    2. Hyperlipidemia -  On rosuvastatin 40 mg a day , Zetia 10 mg a day . LDL is 83.   He will continue to exercise and watch his diet    Current medicines are reviewed at length with the patient today.  The patient does not have concerns regarding medicines.  The following changes have been made:  no change  Labs/ tests ordered today include:   Orders Placed This Encounter  Procedures  . Lipid Profile  . Hepatic function panel  . Basic metabolic panel  . EKG 12-Lead     Disposition:   FU with me in 1 year .      Mertie Moores, MD  07/13/2019 8:27 AM    [facility] [place], [place], [place]  [address] Phone: [phone]; Fax: [phone]

## 2019-07-13 NOTE — Patient Instructions (Signed)
Medication Instructions:    Your physician recommends that you continue on your current medications as directed. Please refer to the Current Medication list given to you today.  *If you need a refill on your cardiac medications before your next appointment, please call your pharmacy*   Lab Work:  IN ONE YEAR, SAME DAY AS YOUR ONE YEAR FOLLOW-UP APPOINTMENT WITH DR. Jetty Duhamel WILL CHECK LIPIDS, LFTs, AND A BMET--PLEASE COME FASTING TO THIS LAB APPOINTMENT.  If you have labs (blood work) drawn today and your tests are completely normal, you will receive your results only by: Marland Kitchen MyChart Message (if you have MyChart) OR . A paper copy in the mail If you have any lab test that is abnormal or we need to change your treatment, we will call you to review the results.   Follow-Up: At [facility], you and your health needs are our priority.  As part of our continuing mission to provide you with exceptional heart care, we have created designated Provider Care Teams.  These Care Teams include your primary Cardiologist (physician) and Advanced Practice Providers (APPs -  Physician Assistants and Nurse Practitioners) who all work together to provide you with the care you need, when you need it.  We recommend signing up for the patient portal called "MyChart".  Sign up information is provided on this After Visit Summary.  MyChart is used to connect with patients for Virtual Visits (Telemedicine).  Patients are able to view lab/test results, encounter notes, upcoming appointments, etc.  Non-urgent messages can be sent to your provider as well.   To learn more about what you can do with MyChart, go to [email or web address].    Your next appointment:   12 month(s)--WITH LABS SAME DAY  The format for your next appointment:   In Person  Provider:   Mertie Moores, MD

## 2019-07-16 DIAGNOSIS — E782 Mixed hyperlipidemia: Secondary | ICD-10-CM | POA: Diagnosis not present

## 2019-07-16 DIAGNOSIS — H4089 Other specified glaucoma: Secondary | ICD-10-CM | POA: Diagnosis not present

## 2019-07-16 DIAGNOSIS — N400 Enlarged prostate without lower urinary tract symptoms: Secondary | ICD-10-CM | POA: Diagnosis not present

## 2019-07-16 DIAGNOSIS — I2510 Atherosclerotic heart disease of native coronary artery without angina pectoris: Secondary | ICD-10-CM | POA: Diagnosis not present

## 2019-08-07 DIAGNOSIS — I2510 Atherosclerotic heart disease of native coronary artery without angina pectoris: Secondary | ICD-10-CM | POA: Diagnosis not present

## 2019-08-07 DIAGNOSIS — N400 Enlarged prostate without lower urinary tract symptoms: Secondary | ICD-10-CM | POA: Diagnosis not present

## 2019-08-07 DIAGNOSIS — H4089 Other specified glaucoma: Secondary | ICD-10-CM | POA: Diagnosis not present

## 2019-08-07 DIAGNOSIS — E782 Mixed hyperlipidemia: Secondary | ICD-10-CM | POA: Diagnosis not present

## 2019-09-08 DIAGNOSIS — E782 Mixed hyperlipidemia: Secondary | ICD-10-CM | POA: Diagnosis not present

## 2019-09-08 DIAGNOSIS — H4089 Other specified glaucoma: Secondary | ICD-10-CM | POA: Diagnosis not present

## 2019-09-08 DIAGNOSIS — N400 Enlarged prostate without lower urinary tract symptoms: Secondary | ICD-10-CM | POA: Diagnosis not present

## 2019-09-08 DIAGNOSIS — I2510 Atherosclerotic heart disease of native coronary artery without angina pectoris: Secondary | ICD-10-CM | POA: Diagnosis not present

## 2019-09-11 DIAGNOSIS — Z1159 Encounter for screening for other viral diseases: Secondary | ICD-10-CM | POA: Diagnosis not present

## 2019-09-11 DIAGNOSIS — E782 Mixed hyperlipidemia: Secondary | ICD-10-CM | POA: Diagnosis not present

## 2019-09-11 DIAGNOSIS — Z79899 Other long term (current) drug therapy: Secondary | ICD-10-CM | POA: Diagnosis not present

## 2019-09-11 DIAGNOSIS — Z125 Encounter for screening for malignant neoplasm of prostate: Secondary | ICD-10-CM | POA: Diagnosis not present

## 2019-09-16 DIAGNOSIS — H4089 Other specified glaucoma: Secondary | ICD-10-CM | POA: Diagnosis not present

## 2019-09-16 DIAGNOSIS — L309 Dermatitis, unspecified: Secondary | ICD-10-CM | POA: Diagnosis not present

## 2019-09-16 DIAGNOSIS — E782 Mixed hyperlipidemia: Secondary | ICD-10-CM | POA: Diagnosis not present

## 2019-09-16 DIAGNOSIS — Z1159 Encounter for screening for other viral diseases: Secondary | ICD-10-CM | POA: Diagnosis not present

## 2019-09-16 DIAGNOSIS — N400 Enlarged prostate without lower urinary tract symptoms: Secondary | ICD-10-CM | POA: Diagnosis not present

## 2019-09-16 DIAGNOSIS — I2510 Atherosclerotic heart disease of native coronary artery without angina pectoris: Secondary | ICD-10-CM | POA: Diagnosis not present

## 2019-09-16 DIAGNOSIS — R8290 Unspecified abnormal findings in urine: Secondary | ICD-10-CM | POA: Diagnosis not present

## 2019-09-16 DIAGNOSIS — D649 Anemia, unspecified: Secondary | ICD-10-CM | POA: Diagnosis not present

## 2019-09-16 DIAGNOSIS — E871 Hypo-osmolality and hyponatremia: Secondary | ICD-10-CM | POA: Diagnosis not present

## 2019-09-16 DIAGNOSIS — Z125 Encounter for screening for malignant neoplasm of prostate: Secondary | ICD-10-CM | POA: Diagnosis not present

## 2019-09-16 DIAGNOSIS — Z79899 Other long term (current) drug therapy: Secondary | ICD-10-CM | POA: Diagnosis not present

## 2019-09-16 DIAGNOSIS — Z0000 Encounter for general adult medical examination without abnormal findings: Secondary | ICD-10-CM | POA: Diagnosis not present

## 2019-09-28 DIAGNOSIS — D649 Anemia, unspecified: Secondary | ICD-10-CM | POA: Diagnosis not present

## 2019-10-06 DIAGNOSIS — E782 Mixed hyperlipidemia: Secondary | ICD-10-CM | POA: Diagnosis not present

## 2019-10-06 DIAGNOSIS — N400 Enlarged prostate without lower urinary tract symptoms: Secondary | ICD-10-CM | POA: Diagnosis not present

## 2019-10-06 DIAGNOSIS — I2510 Atherosclerotic heart disease of native coronary artery without angina pectoris: Secondary | ICD-10-CM | POA: Diagnosis not present

## 2019-10-06 DIAGNOSIS — H4089 Other specified glaucoma: Secondary | ICD-10-CM | POA: Diagnosis not present

## 2019-10-19 DIAGNOSIS — Z79899 Other long term (current) drug therapy: Secondary | ICD-10-CM | POA: Diagnosis not present

## 2019-10-19 DIAGNOSIS — L309 Dermatitis, unspecified: Secondary | ICD-10-CM | POA: Diagnosis not present

## 2019-10-19 DIAGNOSIS — D649 Anemia, unspecified: Secondary | ICD-10-CM | POA: Diagnosis not present

## 2019-10-19 DIAGNOSIS — E871 Hypo-osmolality and hyponatremia: Secondary | ICD-10-CM | POA: Diagnosis not present

## 2019-10-19 DIAGNOSIS — E782 Mixed hyperlipidemia: Secondary | ICD-10-CM | POA: Diagnosis not present

## 2019-10-19 DIAGNOSIS — R8290 Unspecified abnormal findings in urine: Secondary | ICD-10-CM | POA: Diagnosis not present

## 2019-10-19 DIAGNOSIS — Z125 Encounter for screening for malignant neoplasm of prostate: Secondary | ICD-10-CM | POA: Diagnosis not present

## 2019-10-19 DIAGNOSIS — Z1159 Encounter for screening for other viral diseases: Secondary | ICD-10-CM | POA: Diagnosis not present

## 2019-10-19 DIAGNOSIS — I2510 Atherosclerotic heart disease of native coronary artery without angina pectoris: Secondary | ICD-10-CM | POA: Diagnosis not present

## 2019-10-19 DIAGNOSIS — N400 Enlarged prostate without lower urinary tract symptoms: Secondary | ICD-10-CM | POA: Diagnosis not present

## 2019-10-19 DIAGNOSIS — H4089 Other specified glaucoma: Secondary | ICD-10-CM | POA: Diagnosis not present

## 2019-11-02 DIAGNOSIS — H401132 Primary open-angle glaucoma, bilateral, moderate stage: Secondary | ICD-10-CM | POA: Diagnosis not present

## 2019-11-03 DIAGNOSIS — E782 Mixed hyperlipidemia: Secondary | ICD-10-CM | POA: Diagnosis not present

## 2019-11-03 DIAGNOSIS — N400 Enlarged prostate without lower urinary tract symptoms: Secondary | ICD-10-CM | POA: Diagnosis not present

## 2019-11-03 DIAGNOSIS — H4089 Other specified glaucoma: Secondary | ICD-10-CM | POA: Diagnosis not present

## 2019-11-03 DIAGNOSIS — I2510 Atherosclerotic heart disease of native coronary artery without angina pectoris: Secondary | ICD-10-CM | POA: Diagnosis not present

## 2019-12-14 DIAGNOSIS — N400 Enlarged prostate without lower urinary tract symptoms: Secondary | ICD-10-CM | POA: Diagnosis not present

## 2019-12-14 DIAGNOSIS — I2510 Atherosclerotic heart disease of native coronary artery without angina pectoris: Secondary | ICD-10-CM | POA: Diagnosis not present

## 2019-12-14 DIAGNOSIS — H4089 Other specified glaucoma: Secondary | ICD-10-CM | POA: Diagnosis not present

## 2019-12-14 DIAGNOSIS — E782 Mixed hyperlipidemia: Secondary | ICD-10-CM | POA: Diagnosis not present

## 2019-12-18 DIAGNOSIS — D649 Anemia, unspecified: Secondary | ICD-10-CM | POA: Diagnosis not present

## 2019-12-22 DIAGNOSIS — I2510 Atherosclerotic heart disease of native coronary artery without angina pectoris: Secondary | ICD-10-CM | POA: Diagnosis not present

## 2019-12-22 DIAGNOSIS — E782 Mixed hyperlipidemia: Secondary | ICD-10-CM | POA: Diagnosis not present

## 2019-12-22 DIAGNOSIS — N400 Enlarged prostate without lower urinary tract symptoms: Secondary | ICD-10-CM | POA: Diagnosis not present

## 2019-12-22 DIAGNOSIS — H4089 Other specified glaucoma: Secondary | ICD-10-CM | POA: Diagnosis not present

## 2020-02-11 DIAGNOSIS — H401132 Primary open-angle glaucoma, bilateral, moderate stage: Secondary | ICD-10-CM | POA: Diagnosis not present

## 2020-02-17 DIAGNOSIS — D649 Anemia, unspecified: Secondary | ICD-10-CM | POA: Diagnosis not present

## 2020-03-07 DIAGNOSIS — E782 Mixed hyperlipidemia: Secondary | ICD-10-CM | POA: Diagnosis not present

## 2020-03-07 DIAGNOSIS — I2510 Atherosclerotic heart disease of native coronary artery without angina pectoris: Secondary | ICD-10-CM | POA: Diagnosis not present

## 2020-03-07 DIAGNOSIS — N400 Enlarged prostate without lower urinary tract symptoms: Secondary | ICD-10-CM | POA: Diagnosis not present

## 2020-03-07 DIAGNOSIS — H4089 Other specified glaucoma: Secondary | ICD-10-CM | POA: Diagnosis not present

## 2020-03-08 DIAGNOSIS — R059 Cough, unspecified: Secondary | ICD-10-CM | POA: Diagnosis not present

## 2020-03-08 DIAGNOSIS — B349 Viral infection, unspecified: Secondary | ICD-10-CM | POA: Diagnosis not present

## 2020-03-08 DIAGNOSIS — R0981 Nasal congestion: Secondary | ICD-10-CM | POA: Diagnosis not present

## 2020-03-08 DIAGNOSIS — Z20822 Contact with and (suspected) exposure to covid-19: Secondary | ICD-10-CM | POA: Diagnosis not present

## 2020-04-21 DIAGNOSIS — D649 Anemia, unspecified: Secondary | ICD-10-CM | POA: Diagnosis not present

## 2020-06-16 ENCOUNTER — Other Ambulatory Visit: Payer: Self-pay

## 2020-06-16 ENCOUNTER — Other Ambulatory Visit: Payer: PPO

## 2020-06-16 DIAGNOSIS — E782 Mixed hyperlipidemia: Secondary | ICD-10-CM

## 2020-06-16 DIAGNOSIS — I25810 Atherosclerosis of coronary artery bypass graft(s) without angina pectoris: Secondary | ICD-10-CM | POA: Diagnosis not present

## 2020-06-16 LAB — BASIC METABOLIC PANEL
BUN/Creatinine Ratio: 10 (ref 10–24)
BUN: 9 mg/dL (ref 8–27)
CO2: 25 mmol/L (ref 20–29)
Calcium: 9.1 mg/dL (ref 8.6–10.2)
Chloride: 95 mmol/L — ABNORMAL LOW (ref 96–106)
Creatinine, Ser: 0.87 mg/dL (ref 0.76–1.27)
Glucose: 95 mg/dL (ref 65–99)
Potassium: 4.7 mmol/L (ref 3.5–5.2)
Sodium: 133 mmol/L — ABNORMAL LOW (ref 134–144)
eGFR: 93 mL/min/{1.73_m2} (ref >59)

## 2020-06-16 LAB — LIPID PANEL
Chol/HDL Ratio: 4 ratio (ref 0.0–5.0)
Cholesterol, Total: 136 mg/dL (ref 100–199)
HDL: 34 mg/dL — ABNORMAL LOW (ref >39)
LDL Chol Calc (NIH): 86 mg/dL (ref 0–99)
Triglycerides: 82 mg/dL (ref 0–149)
VLDL Cholesterol Cal: 16 mg/dL (ref 5–40)

## 2020-06-16 LAB — HEPATIC FUNCTION PANEL
ALT: 20 IU/L (ref 0–44)
AST: 27 IU/L (ref 0–40)
Albumin: 4.4 g/dL (ref 3.8–4.8)
Alkaline Phosphatase: 72 IU/L (ref 44–121)
Bilirubin Total: 0.5 mg/dL (ref 0.0–1.2)
Bilirubin, Direct: 0.15 mg/dL (ref 0.00–0.40)
Total Protein: 7.6 g/dL (ref 6.0–8.5)

## 2020-06-20 DIAGNOSIS — N400 Enlarged prostate without lower urinary tract symptoms: Secondary | ICD-10-CM | POA: Diagnosis not present

## 2020-06-20 DIAGNOSIS — H4089 Other specified glaucoma: Secondary | ICD-10-CM | POA: Diagnosis not present

## 2020-06-20 DIAGNOSIS — I2510 Atherosclerotic heart disease of native coronary artery without angina pectoris: Secondary | ICD-10-CM | POA: Diagnosis not present

## 2020-06-20 DIAGNOSIS — E782 Mixed hyperlipidemia: Secondary | ICD-10-CM | POA: Diagnosis not present

## 2020-07-14 DIAGNOSIS — H401132 Primary open-angle glaucoma, bilateral, moderate stage: Secondary | ICD-10-CM | POA: Diagnosis not present

## 2020-07-21 ENCOUNTER — Encounter: Payer: Self-pay | Admitting: Cardiovascular Disease

## 2020-07-21 NOTE — Progress Notes (Signed)
Cardiology Office Note   Date:  07/22/2020   ID:  Roberto Ponce, DOB Jan 28, 1951, MRN [id number]  PCP:  Kristen Loader, FNP  Cardiologist:   Mertie Moores, MD   Chief Complaint  Patient presents with  . Coronary Artery Disease  . Hyperlipidemia   1. Coronary artery disease, CABG ( LIMA to LAD, RIMA to RCA) - 70 ( age 70) , old Inf. MI prior to CABG.  2. Hyperlipidemia  Previous notes:   Roberto Ponce is a 70 yo with hx of CAD, CABG. He was previously a patient of Dr. Susa Simmonds. He has not had any angina. Stays on a good diet. He is now retired .     Jun 21, 2014:    Roberto Ponce is a 70 y.o. male who presents for  His CAD. Stays very active.  Exercises regularly .  Jul 05, 2015:  Roberto Ponce is seen today for follow up of his CAD, had CABG in 1988 Roberto Heater, MD)  Very active.  Works out on the treadmill regularly . Diet is generally good   No CP or dyspnea.   Jul 09, 2016:  Roberto Ponce is seen today for follow Up of his coronary artery disease. Coronary artery bypass grafting at age 42. Is now retired.  Keeps busy with his 6 grandchildren.   Still exercising regularly , works on the treadmill and "total [employer]" Labs have been checked by Hulan Fess, MD   Jul 09, 2017:  Roberto Ponce is seen today for follow up of his CAD . Enjoying retirement . BP is a bit elevated today .   Home readings have also been elevated. Has been eating more salt than he should  Going to the gym - treadmill 4 days a week.    Jul 13, 2019: Roberto Ponce is seen today for follow-up of his coronary artery disease. Had shingles last month.  Feeling better now.  Is back exercising .  No CP or dyspnea Labs were reviewed..  Chol levels look ok . LDL is 83.   On rosuvastatin 40 and zetia 10 mg a day   July 22, 2020: Roberto Ponce is seen today for follow up of his CAD. S/p CABG  Had covid in January. Is gradually getting stronger .  No CP or angina    Past Medical History:  Diagnosis Date  .  Coronary artery disease   . Glaucoma   . History of gallstones   . Hyperlipidemia   . LVH (left ventricular hypertrophy)   . MI (myocardial infarction) ([place])   . MI, old   . Mild aortic sclerosis   . Mitral regurgitation   . Mixed dyslipidemia   . Venous stasis     Past Surgical History:  Procedure Laterality Date  . CARDIAC CATHETERIZATION  09/09/86   EF 56%  . CARDIOVASCULAR STRESS TEST  12/16/2007   EF 54% NO ISCHEMIA  . CORONARY ARTERY BYPASS GRAFT     LIMA TO THE LAD AND RIMA TO RIGHT CORONARY ARTERY  . US ECHOCARDIOGRAPHY  12/12/2007   EF 55-60%     Current Outpatient Medications  Medication Sig Dispense Refill  . aspirin EC 81 MG tablet Take 1 tablet (81 mg total) by mouth daily.    . dorzolamide (TRUSOPT) 2 % ophthalmic solution Place 1 drop into both eyes 2 (two) times daily.     Marland Kitchen ezetimibe (ZETIA) 10 MG tablet TAKE 1 TABLET(10 MG) BY MOUTH DAILY 90 tablet 1  . latanoprost (XALATAN) 0.005 %  ophthalmic solution Place 1 drop into both eyes at bedtime.     . rosuvastatin (CRESTOR) 40 MG tablet Take 40 mg by mouth at bedtime.    . triamcinolone cream (KENALOG) 0.1 % Apply 1 application topically as needed.     No current facility-administered medications for this visit.    Allergies:   Patient has no known allergies.    Social History:  The patient  reports that he has never smoked. He has never used smokeless tobacco. He reports that he does not drink alcohol and does not use drugs.   Family History:  The patient's family history includes Heart attack in his father; Stroke in his father.    ROS:   Noted in current history, otherwise review of systems is negative.   Physical Exam: Blood pressure 128/74, pulse 67, height 5\' 11"  (1.803 m), weight 171 lb (77.6 kg), SpO2 98 %.  GEN:  Well nourished, well developed in no acute distress HEENT: Normal NECK: No JVD; No carotid bruits LYMPHATICS: No lymphadenopathy CARDIAC: RRR 2/6 late systolic murmur   RESPIRATORY:  Clear to auscultation without rales, wheezing or rhonchi  ABDOMEN: Soft, non-tender, non-distended MUSCULOSKELETAL:  No edema; No deformity  SKIN: Warm and dry NEUROLOGIC:  Alert and oriented x 3     EKG:   July 22, 2020: Normal sinus rhythm at 67.  No ST or T wave changes.   Recent Labs: 06/16/2020: ALT 20; BUN 9; Creatinine, Ser 0.87; Potassium 4.7; Sodium 133    Lipid Panel    Component Value Date/Time   CHOL 136 06/16/2020 0954   TRIG 82 06/16/2020 0954   HDL 34 (L) 06/16/2020 0954   CHOLHDL 4.0 06/16/2020 0954   LDLCALC 86 06/16/2020 0954      Wt Readings from Last 3 Encounters:  07/22/20 171 lb (77.6 kg)  07/13/19 171 lb 6.4 oz (77.7 kg)  07/09/18 170 lb (77.1 kg)      Other studies Reviewed: Additional studies/ records that were reviewed today include: . Review of the above records demonstrates:    ASSESSMENT AND PLAN:  1. Coronary artery disease, CABG ( LIMA to LAD, RIMA to RCA) - 70 ( age 70) ,  .     2. Hyperlipidemia -    3.  MVP :  Has a late peaking systolic murmur Previous echo in 2009.  Will repeat echo. Has has had more dyspnea since Jan.   Thinks is covid but may be worsening  MR  Will have him follow up with Dr. Gasper Sells in a year.     Current medicines are reviewed at length with the patient today.  The patient does not have concerns regarding medicines.  The following changes have been made:  no change  Labs/ tests ordered today include:   No orders of the defined types were placed in this encounter.    Disposition:        Mertie Moores, MD  07/22/2020 8:30 AM    [facility] [address], [place], [place]  [address] Phone: [phone]; Fax: [phone]

## 2020-07-22 ENCOUNTER — Other Ambulatory Visit: Payer: Self-pay

## 2020-07-22 ENCOUNTER — Encounter: Payer: Self-pay | Admitting: Cardiovascular Disease

## 2020-07-22 ENCOUNTER — Ambulatory Visit: Payer: PPO | Admitting: Cardiovascular Disease

## 2020-07-22 VITALS — BP 128/74 | HR 67 | Ht 71.0 in | Wt 171.0 lb

## 2020-07-22 DIAGNOSIS — I341 Nonrheumatic mitral (valve) prolapse: Secondary | ICD-10-CM | POA: Diagnosis not present

## 2020-07-22 DIAGNOSIS — I25810 Atherosclerosis of coronary artery bypass graft(s) without angina pectoris: Secondary | ICD-10-CM

## 2020-07-22 DIAGNOSIS — I340 Nonrheumatic mitral (valve) insufficiency: Secondary | ICD-10-CM | POA: Diagnosis not present

## 2020-07-22 NOTE — Patient Instructions (Signed)
Medication Instructions:  Your physician recommends that you continue on your current medications as directed. Please refer to the Current Medication list given to you today.  *If you need a refill on your cardiac medications before your next appointment, please call your pharmacy*   Lab Work: None If you have labs (blood work) drawn today and your tests are completely normal, you will receive your results only by: Marland Kitchen MyChart Message (if you have MyChart) OR . A paper copy in the mail If you have any lab test that is abnormal or we need to change your treatment, we will call you to review the results.   Testing/Procedures: Your physician has requested that you have an echocardiogram. Echocardiography is a painless test that uses sound waves to create images of your heart. It provides your doctor with information about the size and shape of your heart and how well your heart's chambers and valves are working. This procedure takes approximately one hour. There are no restrictions for this procedure.    Follow-Up: At [facility], you and your health needs are our priority.  As part of our continuing mission to provide you with exceptional heart care, we have created designated Provider Care Teams.  These Care Teams include your primary Cardiologist (physician) and Advanced Practice Providers (APPs -  Physician Assistants and Nurse Practitioners) who all work together to provide you with the care you need, when you need it.  We recommend signing up for the patient portal called "MyChart".  Sign up information is provided on this After Visit Summary.  MyChart is used to connect with patients for Virtual Visits (Telemedicine).  Patients are able to view lab/test results, encounter notes, upcoming appointments, etc.  Non-urgent messages can be sent to your provider as well.   To learn more about what you can do with MyChart, go to [email or web address].    Your next appointment:   1  year(s)  The format for your next appointment:   In Person  Provider:   You may see Rudean Haskell, MD or one of the following Advanced Practice Providers on your designated Care Team:    Melina Copa, PA-C  Ermalinda Barrios, PA-C    Other Instructions

## 2020-08-17 ENCOUNTER — Other Ambulatory Visit: Payer: Self-pay

## 2020-08-17 ENCOUNTER — Ambulatory Visit (HOSPITAL_COMMUNITY): Payer: PPO | Attending: Cardiovascular Disease

## 2020-08-17 DIAGNOSIS — I341 Nonrheumatic mitral (valve) prolapse: Secondary | ICD-10-CM | POA: Diagnosis not present

## 2020-08-17 LAB — ECHOCARDIOGRAM COMPLETE
Area-P 1/2: 5.02 cm2
MV M vel: 5.11 m/s
MV Peak grad: 104.4 mmHg
Radius: 0.6 cm
S' Lateral: 3.1 cm

## 2020-08-19 ENCOUNTER — Telehealth: Payer: Self-pay

## 2020-08-19 NOTE — Telephone Encounter (Signed)
-----   Message from Thayer Headings, MD sent at 08/18/2020  1:28 PM EDT ----- Pt has a hx of MVP.  The MR appears to be severe on this echo.   Normal LV function. He is scheduled to see Dr. Gasper Sells in a year but Dr. Debara Pickett recommends a TEE for further evaluation of his worsening MR. Lavone Barrientes, would you see if we can get him scheduled for a TEE on one of Dr. Oralia Rud Reader B days .   Depending on the TEE results, he may been earlier follow up.  Thanks

## 2020-08-21 NOTE — Addendum Note (Signed)
Addended by: Rudean Haskell A on: 08/21/2020 03:01 PM   Modules accepted: Orders, SmartSet

## 2020-09-02 ENCOUNTER — Encounter (HOSPITAL_COMMUNITY)
Admission: RE | Disposition: A | Discharge status: Home / Self Care | Payer: Self-pay | Source: Home / Self Care | Attending: Internal Medicine

## 2020-09-02 ENCOUNTER — Ambulatory Visit (HOSPITAL_COMMUNITY): Payer: PPO | Admitting: Anesthesiology

## 2020-09-02 ENCOUNTER — Ambulatory Visit (HOSPITAL_COMMUNITY)
Admission: RE | Admit: 2020-09-02 | Discharge: 2020-09-02 | Disposition: A | Discharge status: Home / Self Care | Payer: PPO | Attending: Internal Medicine | Admitting: Internal Medicine

## 2020-09-02 ENCOUNTER — Ambulatory Visit (HOSPITAL_BASED_OUTPATIENT_CLINIC_OR_DEPARTMENT_OTHER)
Admission: RE | Admit: 2020-09-02 | Discharge: 2020-09-02 | Disposition: A | Discharge status: Home / Self Care | Payer: PPO | Source: Home / Self Care | Attending: Internal Medicine | Admitting: Internal Medicine

## 2020-09-02 ENCOUNTER — Other Ambulatory Visit: Payer: Self-pay

## 2020-09-02 ENCOUNTER — Encounter (HOSPITAL_COMMUNITY): Payer: Self-pay | Admitting: Internal Medicine

## 2020-09-02 DIAGNOSIS — I340 Nonrheumatic mitral (valve) insufficiency: Secondary | ICD-10-CM

## 2020-09-02 DIAGNOSIS — I252 Old myocardial infarction: Secondary | ICD-10-CM | POA: Diagnosis not present

## 2020-09-02 DIAGNOSIS — Z951 Presence of aortocoronary bypass graft: Secondary | ICD-10-CM | POA: Diagnosis not present

## 2020-09-02 DIAGNOSIS — E782 Mixed hyperlipidemia: Secondary | ICD-10-CM | POA: Diagnosis not present

## 2020-09-02 DIAGNOSIS — I2510 Atherosclerotic heart disease of native coronary artery without angina pectoris: Secondary | ICD-10-CM | POA: Diagnosis not present

## 2020-09-02 DIAGNOSIS — Z79899 Other long term (current) drug therapy: Secondary | ICD-10-CM | POA: Diagnosis not present

## 2020-09-02 DIAGNOSIS — Z7982 Long term (current) use of aspirin: Secondary | ICD-10-CM | POA: Diagnosis not present

## 2020-09-02 DIAGNOSIS — I081 Rheumatic disorders of both mitral and tricuspid valves: Secondary | ICD-10-CM | POA: Diagnosis not present

## 2020-09-02 HISTORY — PX: TEE WITHOUT CARDIOVERSION: SHX5443

## 2020-09-02 LAB — ECHO TEE
Area-P 1/2: 1.76 cm2
MV VTI: 0.91 cm2

## 2020-09-02 SURGERY — ECHOCARDIOGRAM, TRANSESOPHAGEAL
Anesthesia: Monitor Anesthesia Care

## 2020-09-02 MED ORDER — PROPOFOL 10 MG/ML IV BOLUS
INTRAVENOUS | Status: DC | PRN
  Administered 2020-09-02: 30 mg via INTRAVENOUS

## 2020-09-02 MED ORDER — LIDOCAINE 2% (20 MG/ML) 5 ML SYRINGE
INTRAMUSCULAR | Status: DC | PRN
  Administered 2020-09-02: 40 mg via INTRAVENOUS

## 2020-09-02 MED ORDER — EPHEDRINE SULFATE-NACL 50-0.9 MG/10ML-% IV SOSY
PREFILLED_SYRINGE | INTRAVENOUS | Status: DC | PRN
  Administered 2020-09-02 (×2): 10 mg via INTRAVENOUS

## 2020-09-02 MED ORDER — PROPOFOL 500 MG/50ML IV EMUL
INTRAVENOUS | Status: DC | PRN
  Administered 2020-09-02: 125 ug/kg/min via INTRAVENOUS

## 2020-09-02 MED ORDER — SODIUM CHLORIDE 0.9 % IV SOLN: INTRAVENOUS | Status: DC

## 2020-09-02 NOTE — Transfer of Care (Signed)
Immediate Anesthesia Transfer of Care Note  Patient: Roberto Ponce  Procedure(s) Performed: TRANSESOPHAGEAL ECHOCARDIOGRAM (TEE)  Patient Location: Endoscopy Unit  Anesthesia Type:MAC  Level of Consciousness: drowsy  Airway & Oxygen Therapy: Patient Spontanous Breathing  Post-op Assessment: Report given to RN and Post -op Vital signs reviewed and stable  Post vital signs: Reviewed and stable  Last Vitals:  Vitals Value Taken Time  BP 100/55 09/02/20 0922  Temp    Pulse 66 09/02/20 0923  Resp 15 09/02/20 0923  SpO2 95 % 09/02/20 0923  Vitals shown include unvalidated device data.  Last Pain:  Vitals:   09/02/20 0922  TempSrc:   PainSc: Asleep         Complications: No notable events documented.

## 2020-09-02 NOTE — H&P (Signed)
Cardiology H&P Note:    Date:  09/02/2020   ID:  Roberto Ponce, DOB 05-03-1950, MRN [id number]  PCP:  Kristen Loader, FNP   [facility] Cardiologist:  Mertie Moores, MD    CC: MR  History of Present Illness:    Roberto Ponce is a 70 y.o. male with a hx of CAD s/p CABG who has MVP and presents with worsened MR.  Patient notes that he is doing well.  Since last visit notes no changes.  No chest pain or pressure .  No SOB/DOE and no PND/Orthopnea.  No weight gain or leg swelling.  No palpitations or syncope .   Past Medical History:  Diagnosis Date   Coronary artery disease    Glaucoma    History of gallstones    Hyperlipidemia    LVH (left ventricular hypertrophy)    MI (myocardial infarction) ([facility])    MI, old    Mild aortic sclerosis    Mitral regurgitation    Mixed dyslipidemia    Venous stasis     Past Surgical History:  Procedure Laterality Date   CARDIAC CATHETERIZATION  09/09/86   EF 56%   CARDIOVASCULAR STRESS TEST  12/16/2007   EF 54% NO ISCHEMIA   CORONARY ARTERY BYPASS GRAFT     LIMA TO THE LAD AND RIMA TO RIGHT CORONARY ARTERY   US ECHOCARDIOGRAPHY  12/12/2007   EF 55-60%    Current Medications: Current Meds  Medication Sig   aspirin EC 81 MG tablet Take 1 tablet (81 mg total) by mouth daily. (Patient taking differently: Take 81 mg by mouth at bedtime.)   dorzolamide-timolol (COSOPT) 22.3-6.8 MG/ML ophthalmic solution Place 1 drop into both eyes 2 (two) times daily.   ezetimibe (ZETIA) 10 MG tablet TAKE 1 TABLET(10 MG) BY MOUTH DAILY (Patient taking differently: Take 10 mg by mouth at bedtime.)   ferrous sulfate 325 (65 FE) MG tablet Take 325 mg by mouth once a week.   latanoprost (XALATAN) 0.005 % ophthalmic solution Place 1 drop into both eyes at bedtime.    rosuvastatin (CRESTOR) 40 MG tablet Take 40 mg by mouth at bedtime.   triamcinolone cream (KENALOG) 0.1 % Apply 1 application topically 2 (two) times daily as needed  (eczema).     Allergies:   Patient has no known allergies.   Social History   Socioeconomic History   Marital status: Married    Spouse name: Not on file   Number of children: Not on file   Years of education: Not on file   Highest education level: Not on file  Occupational History   Not on file  Tobacco Use   Smoking status: Never   Smokeless tobacco: Never  Vaping Use   Vaping Use: Never used  Substance and Sexual Activity   Alcohol use: No   Drug use: No   Sexual activity: Not on file  Other Topics Concern   Not on file  Social History Narrative   Not on file   Social Determinants of Health   Financial Resource Strain: Not on file  Food Insecurity: Not on file  Transportation Needs: Not on file  Physical Activity: Not on file  Stress: Not on file  Social Connections: Not on file     Family History: The patient's family history includes Heart attack in his father; Stroke in his father.  ROS:   Please see the history of present illness.     All other systems reviewed and  are negative.  EKGs/Labs/Other Studies Reviewed:    The following studies were reviewed today:   Recent Labs: 06/16/2020: ALT 20; BUN 9; Creatinine, Ser 0.87; Potassium 4.7; Sodium 133  Recent Lipid Panel    Component Value Date/Time   CHOL 136 06/16/2020 0954   TRIG 82 06/16/2020 0954   HDL 34 (L) 06/16/2020 0954   CHOLHDL 4.0 06/16/2020 0954   LDLCALC 86 06/16/2020 0954   Physical Exam:    VS:  BP 128/71   Pulse 73   Temp 98.3 F (36.8 C) (Oral)   Resp (!) 24   Ht 5\' 11"  (1.803 m)   Wt 75.8 kg   SpO2 96%   BMI 23.29 kg/m     Wt Readings from Last 3 Encounters:  09/02/20 75.8 kg  07/22/20 77.6 kg  07/13/19 77.7 kg     GEN:  Well nourished, well developed in no acute distress HEENT: Normal NECK: No JVD; No carotid bruits LYMPHATICS: No lymphadenopathy CARDIAC: RRR, no rubs, gallops; systolic murmur RESPIRATORY:  Clear to auscultation without rales, wheezing or  rhonchi  ABDOMEN: Soft, non-tender, non-distended MUSCULOSKELETAL:  No edema; No deformity  SKIN: Warm and dry NEUROLOGIC:  Alert and oriented x 3 PSYCHIATRIC:  Normal affect   ASSESSMENT:    No diagnosis found. PLAN:    CAD s/p CABG MR - TEE for today   Shared Decision Making/Informed Consent The risks [esophageal damage, perforation (1:10,000 risk), bleeding, pharyngeal hematoma as well as other potential complications associated with conscious sedation including aspiration, arrhythmia, respiratory failure and death], benefits (treatment guidance and diagnostic support) and alternatives of a transesophageal echocardiogram were discussed in detail with Mr. [name] and he is willing to proceed.     Medication Adjustments/Labs and Tests Ordered: Current medicines are reviewed at length with the patient today.  Concerns regarding medicines are outlined above.  Orders Placed This Encounter  Procedures   Diet NPO time specified   Informed Consent Details: Physician/Practitioner Attestation; Transcribe to consent form and obtain patient signature   Cardiac monitoring   Vital signs   Measure blood pressure   Verify informed consent   Remove and safely store all jewelry.  Tape rings in place that cannot be removed.   Patient to wear a single hospital gown - Ask patient to remove dentures, if any   Positioning instruction   Echo machine on patient's right. Verify adequate EKG signal.   When patient fully awake discontinue Oxygen, change IV to saline lock and activity Ad Lib - Notify physician when completed   Notify performing physician when patient is ready for discharge   Pre-admission testing diagnosis   Provider attestation of informed consent for procedure/surgical case   Pulse oximetry, continuous   Oxygen therapy Mode or (Route): Nasal cannula; Liters Per Minute: 2   ECHO TEE   Insert peripheral IV   Meds ordered this encounter  Medications   0.9 %  sodium chloride  infusion    There are no outpatient Patient Instructions on file for this admission.   Signed, Werner Lean, MD  09/02/2020 7:48 AM    [facility]

## 2020-09-02 NOTE — Progress Notes (Signed)
  Echocardiogram Echocardiogram Transesophageal has been performed.  Roberto Ponce 09/02/2020, 9:36 AM

## 2020-09-02 NOTE — Anesthesia Preprocedure Evaluation (Addendum)
Anesthesia Evaluation  Patient identified by MRN, date of birth, ID band Patient awake    Reviewed: Allergy & Precautions, NPO status , Patient's Chart, lab work & pertinent test results  Airway Mallampati: I  TM Distance: >3 FB Neck ROM: Full    Dental  (+) Missing, Dental Advisory Given,    Pulmonary asthma ,    Pulmonary exam normal breath sounds clear to auscultation       Cardiovascular + CAD, + Past MI and + CABG (1988, LIMA to LAD, RIMA to RCA)  Normal cardiovascular exam+ Valvular Problems/Murmurs MR  Rhythm:Regular Rate:Normal  TTE 2022 1. Left ventricular ejection fraction, by estimation, is 60 to 65%. The  left ventricle has normal function. The left ventricle demonstrates  regional wall motion abnormalities (see scoring diagram/findings for  description). Left ventricular diastolic  parameters are consistent with Grade I diastolic dysfunction (impaired  relaxation). There is severe akinesis of the left ventricular, basal  inferior wall.  2. Right ventricular systolic function is normal. The right ventricular  size is normal.  3. There appears to be ischemic tethering of the posterior mitral  leaflet, with eccentric posteriorly directed regurgitation that hugs the  atrial wall. The mitral valve is abnormal. Severe mitral valve  regurgitation. Moderate mitral annular  calcification.  4. The aortic valve is tricuspid. Aortic valve regurgitation is not  visualized.    Neuro/Psych negative neurological ROS  negative psych ROS   GI/Hepatic negative GI ROS, Neg liver ROS,   Endo/Other  negative endocrine ROS  Renal/GU negative Renal ROS  negative genitourinary   Musculoskeletal negative musculoskeletal ROS (+)   Abdominal   Peds  Hematology negative hematology ROS (+)   Anesthesia Other Findings TEE for MR  Reproductive/Obstetrics                            Anesthesia  Physical Anesthesia Plan  ASA: 3  Anesthesia Plan: MAC   Post-op Pain Management:    Induction: Intravenous  PONV Risk Score and Plan: Propofol infusion and Treatment may vary due to age or medical condition  Airway Management Planned: Natural Airway  Additional Equipment:   Intra-op Plan:   Post-operative Plan:   Informed Consent: I have reviewed the patients History and Physical, chart, labs and discussed the procedure including the risks, benefits and alternatives for the proposed anesthesia with the patient or authorized representative who has indicated his/her understanding and acceptance.     Dental advisory given  Plan Discussed with: CRNA  Anesthesia Plan Comments:         Anesthesia Quick Evaluation

## 2020-09-02 NOTE — CV Procedure (Signed)
    TRANSESOPHAGEAL ECHOCARDIOGRAM   NAME:  Roberto Ponce    MRN: [id number] DOB:  07/28/1950    ADMIT DATE: 09/02/2020  INDICATIONS: Mitral Regurgitation  PROCEDURE:   Informed consent was obtained prior to the procedure. The risks, benefits and alternatives for the procedure were discussed and the patient comprehended these risks.  Risks include, but are not limited to, cough, sore throat, vomiting, nausea, somnolence, esophageal and stomach trauma or perforation, bleeding, low blood pressure, aspiration, pneumonia, infection, trauma to the teeth and death.    Procedural time out performed. The oropharynx was anesthetized with topical 1% benzocaine.    Anesthesia was administered by Dr. Lanetta Inch and team.  The patient was administered a total of Propofol 316 mg, Lidocaine achieve and maintain moderate to deep conscious sedation.  The patient's heart rate, blood pressure, and oxygen saturation are monitored continuously during the procedure. The period of conscious sedation is 18 minutes, of which I was present face-to-face 100% of this time.   The transesophageal probe was inserted in the esophagus and stomach without difficulty and multiple views were obtained.   COMPLICATIONS:    There were no immediate complications.  KEY FINDINGS:  Preserved LVEF with severe, eccentric mitral regurgitation.  Full report to follow. Further management per primary team.   Rudean Haskell, MD [place]  9:25 AM

## 2020-09-02 NOTE — Anesthesia Procedure Notes (Signed)
Procedure Name: MAC Date/Time: 09/02/2020 8:38 AM Performed by: Reece Agar, CRNA Pre-anesthesia Checklist: Patient identified, Emergency Drugs available, Suction available, Patient being monitored and Timeout performed Patient Re-evaluated:Patient Re-evaluated prior to induction Oxygen Delivery Method: Nasal cannula

## 2020-09-03 ENCOUNTER — Encounter (HOSPITAL_COMMUNITY): Payer: Self-pay | Admitting: Internal Medicine

## 2020-09-03 NOTE — Anesthesia Postprocedure Evaluation (Signed)
Anesthesia Post Note  Patient: Roberto Ponce  Procedure(s) Performed: TRANSESOPHAGEAL ECHOCARDIOGRAM (TEE)     Patient location during evaluation: Endoscopy Anesthesia Type: MAC Level of consciousness: awake and alert Pain management: pain level controlled Vital Signs Assessment: post-procedure vital signs reviewed and stable Respiratory status: spontaneous breathing, nonlabored ventilation, respiratory function stable and patient connected to nasal cannula oxygen Cardiovascular status: blood pressure returned to baseline and stable Postop Assessment: no apparent nausea or vomiting Anesthetic complications: no   No notable events documented.  Last Vitals:  Vitals:   09/02/20 0932 09/02/20 0939  BP: 105/62 110/63  Pulse: 65 74  Resp: 13 (!) 22  Temp:    SpO2: 95% 98%    Last Pain:  Vitals:   09/02/20 0939  TempSrc:   PainSc: 0-No pain                 Damichael Hofman L Katherleen Folkes

## 2020-09-07 ENCOUNTER — Telehealth: Payer: Self-pay

## 2020-09-07 DIAGNOSIS — H401132 Primary open-angle glaucoma, bilateral, moderate stage: Secondary | ICD-10-CM | POA: Diagnosis not present

## 2020-09-07 DIAGNOSIS — N400 Enlarged prostate without lower urinary tract symptoms: Secondary | ICD-10-CM | POA: Diagnosis not present

## 2020-09-07 DIAGNOSIS — I340 Nonrheumatic mitral (valve) insufficiency: Secondary | ICD-10-CM

## 2020-09-07 DIAGNOSIS — I341 Nonrheumatic mitral (valve) prolapse: Secondary | ICD-10-CM

## 2020-09-07 DIAGNOSIS — H4089 Other specified glaucoma: Secondary | ICD-10-CM | POA: Diagnosis not present

## 2020-09-07 DIAGNOSIS — E782 Mixed hyperlipidemia: Secondary | ICD-10-CM | POA: Diagnosis not present

## 2020-09-07 DIAGNOSIS — I2510 Atherosclerotic heart disease of native coronary artery without angina pectoris: Secondary | ICD-10-CM | POA: Diagnosis not present

## 2020-09-07 NOTE — Telephone Encounter (Signed)
Werner Lean, MD  Precious Gilding, RN Could we had this patient get an echo in November and then follow up with me.  I suspect we will follow him at six month intervals and then when things getworse decide on intervention.  I sent his case to [facility] to take a look Called pt reviewed MD recommendations. He is agreeable to the plan. Order placed for Echo. Scheduled for 01/09/21 at 0735.  Follow up appointment with Dr. Gasper Sells is 01/23/21 at [facility].  Patient verbalizes understanding.

## 2020-09-16 DIAGNOSIS — E782 Mixed hyperlipidemia: Secondary | ICD-10-CM | POA: Diagnosis not present

## 2020-09-16 DIAGNOSIS — Z0000 Encounter for general adult medical examination without abnormal findings: Secondary | ICD-10-CM | POA: Diagnosis not present

## 2020-09-16 DIAGNOSIS — Z125 Encounter for screening for malignant neoplasm of prostate: Secondary | ICD-10-CM | POA: Diagnosis not present

## 2020-09-16 DIAGNOSIS — I2510 Atherosclerotic heart disease of native coronary artery without angina pectoris: Secondary | ICD-10-CM | POA: Diagnosis not present

## 2020-09-16 DIAGNOSIS — E871 Hypo-osmolality and hyponatremia: Secondary | ICD-10-CM | POA: Diagnosis not present

## 2020-09-16 DIAGNOSIS — N400 Enlarged prostate without lower urinary tract symptoms: Secondary | ICD-10-CM | POA: Diagnosis not present

## 2020-09-26 DIAGNOSIS — E782 Mixed hyperlipidemia: Secondary | ICD-10-CM | POA: Diagnosis not present

## 2020-09-26 DIAGNOSIS — N400 Enlarged prostate without lower urinary tract symptoms: Secondary | ICD-10-CM | POA: Diagnosis not present

## 2020-09-26 DIAGNOSIS — I2510 Atherosclerotic heart disease of native coronary artery without angina pectoris: Secondary | ICD-10-CM | POA: Diagnosis not present

## 2020-09-26 DIAGNOSIS — E871 Hypo-osmolality and hyponatremia: Secondary | ICD-10-CM | POA: Diagnosis not present

## 2020-09-26 DIAGNOSIS — Z0000 Encounter for general adult medical examination without abnormal findings: Secondary | ICD-10-CM | POA: Diagnosis not present

## 2020-09-26 DIAGNOSIS — I341 Nonrheumatic mitral (valve) prolapse: Secondary | ICD-10-CM | POA: Diagnosis not present

## 2020-10-12 DIAGNOSIS — H401133 Primary open-angle glaucoma, bilateral, severe stage: Secondary | ICD-10-CM | POA: Diagnosis not present

## 2020-11-21 DIAGNOSIS — H401133 Primary open-angle glaucoma, bilateral, severe stage: Secondary | ICD-10-CM | POA: Diagnosis not present

## 2020-12-05 DIAGNOSIS — H401133 Primary open-angle glaucoma, bilateral, severe stage: Secondary | ICD-10-CM | POA: Diagnosis not present

## 2020-12-07 DIAGNOSIS — H4089 Other specified glaucoma: Secondary | ICD-10-CM | POA: Diagnosis not present

## 2020-12-07 DIAGNOSIS — I2510 Atherosclerotic heart disease of native coronary artery without angina pectoris: Secondary | ICD-10-CM | POA: Diagnosis not present

## 2020-12-07 DIAGNOSIS — E782 Mixed hyperlipidemia: Secondary | ICD-10-CM | POA: Diagnosis not present

## 2020-12-07 DIAGNOSIS — N400 Enlarged prostate without lower urinary tract symptoms: Secondary | ICD-10-CM | POA: Diagnosis not present

## 2020-12-30 DIAGNOSIS — N400 Enlarged prostate without lower urinary tract symptoms: Secondary | ICD-10-CM | POA: Diagnosis not present

## 2020-12-30 DIAGNOSIS — E782 Mixed hyperlipidemia: Secondary | ICD-10-CM | POA: Diagnosis not present

## 2020-12-30 DIAGNOSIS — I2510 Atherosclerotic heart disease of native coronary artery without angina pectoris: Secondary | ICD-10-CM | POA: Diagnosis not present

## 2020-12-30 DIAGNOSIS — H4089 Other specified glaucoma: Secondary | ICD-10-CM | POA: Diagnosis not present

## 2021-01-09 ENCOUNTER — Ambulatory Visit (HOSPITAL_COMMUNITY): Payer: PPO | Attending: Internal Medicine

## 2021-01-09 ENCOUNTER — Other Ambulatory Visit: Payer: Self-pay

## 2021-01-09 DIAGNOSIS — I340 Nonrheumatic mitral (valve) insufficiency: Secondary | ICD-10-CM | POA: Insufficient documentation

## 2021-01-09 DIAGNOSIS — I341 Nonrheumatic mitral (valve) prolapse: Secondary | ICD-10-CM | POA: Diagnosis not present

## 2021-01-09 DIAGNOSIS — N5089 Other specified disorders of the male genital organs: Secondary | ICD-10-CM | POA: Diagnosis not present

## 2021-01-09 LAB — ECHOCARDIOGRAM COMPLETE
Area-P 1/2: 3.27 cm2
MV M vel: 5.41 m/s
MV Peak grad: 116.9 mmHg
Radius: 0.5 cm
S' Lateral: 3.6 cm

## 2021-01-18 ENCOUNTER — Telehealth: Payer: Self-pay

## 2021-01-18 NOTE — Telephone Encounter (Addendum)
Imaging uploaded from 09/02/2020 TEE.   "This looks like a clippable valve. The fossa looks approachable for transseptal puncture in both the SAXB and Bicaval views. LA dimensions are large enough for device steering and straddle. The anterior leaflet is overriding the restricted posterior leaflet causing a posteriorly directed MR jet. Based on the TSAX, the jet is central to medial. The posterior leaflet measures about 8/9 mm in the 131 LVOT grasping view. Gradient measured 3 mmHg at 62 BPM. MVA measured 6.3 cm2. Based on this information, I'd start with an NTW and assess for gradient."

## 2021-01-19 ENCOUNTER — Other Ambulatory Visit: Payer: Self-pay | Admitting: Family Medicine

## 2021-01-19 DIAGNOSIS — N5089 Other specified disorders of the male genital organs: Secondary | ICD-10-CM

## 2021-01-23 ENCOUNTER — Ambulatory Visit: Payer: PPO | Admitting: Internal Medicine

## 2021-01-26 ENCOUNTER — Other Ambulatory Visit: Payer: Self-pay

## 2021-01-26 ENCOUNTER — Encounter: Payer: Self-pay | Admitting: Cardiovascular Disease

## 2021-01-26 ENCOUNTER — Ambulatory Visit: Payer: PPO | Admitting: Cardiovascular Disease

## 2021-01-26 VITALS — BP 122/72 | HR 79 | Ht 71.0 in | Wt 169.4 lb

## 2021-01-26 DIAGNOSIS — I340 Nonrheumatic mitral (valve) insufficiency: Secondary | ICD-10-CM | POA: Diagnosis not present

## 2021-01-26 LAB — BASIC METABOLIC PANEL
BUN/Creatinine Ratio: 13 (ref 10–24)
BUN: 10 mg/dL (ref 8–27)
CO2: 29 mmol/L (ref 20–29)
Calcium: 9.3 mg/dL (ref 8.6–10.2)
Chloride: 97 mmol/L (ref 96–106)
Creatinine, Ser: 0.76 mg/dL (ref 0.76–1.27)
Glucose: 93 mg/dL (ref 70–99)
Potassium: 4.8 mmol/L (ref 3.5–5.2)
Sodium: 134 mmol/L (ref 134–144)
eGFR: 97 mL/min/{1.73_m2} (ref >59)

## 2021-01-26 LAB — CBC
Hematocrit: 41.2 % (ref 37.5–51.0)
Hemoglobin: 14 g/dL (ref 13.0–17.7)
MCH: 30.1 pg (ref 26.6–33.0)
MCHC: 34 g/dL (ref 31.5–35.7)
MCV: 89 fL (ref 79–97)
Platelets: 186 10*3/uL (ref 150–450)
RBC: 4.65 x10E6/uL (ref 4.14–5.80)
RDW: 14.1 % (ref 11.6–15.4)
WBC: 7.6 10*3/uL (ref 3.4–10.8)

## 2021-01-26 NOTE — H&P (View-Only) (Signed)
Cardiology Office Note:    Date:  01/26/2021   ID:  SARGENT MANKEY, DOB 1950-10-07, MRN [id number]  PCP:  Kristen Loader, FNP   [facility] Providers Cardiologist:  Mertie Moores, MD     Referring MD: Kristen Loader, FNP   Chief Complaint  Patient presents with   Mitral Regurgitation     History of Present Illness:    Roberto Ponce is a 70 y.o. male presenting for evaluation of mitral regurgitation, referred by Dr Gasper Sells.  Patient has a history of ischemic heart disease with old inferior MI, ultimately treated with CABG at age 68 he was grafted with a LIMA to LAD and RIMA to RCA.  The patient has a history of mild mitral regurgitation in the past.  He was noted to have increased dyspnea when he was last seen by Dr. Acie Fredrickson in June of this year.  It was unclear if this was related to previous COVID infection or whether he might have worsening mitral regurgitation.  His echocardiogram demonstrated severe eccentric mitral regurgitation with what appeared to be tethering of the posterior leaflet of the mitral valve.  He underwent transesophageal echo in July confirming restriction of the posterior leaflet and anterior leaflet override with severe mitral regurgitation.  The patient is referred today for further discussion of treatment options.  He is here with his wife today.  He remains relatively active with mild symptoms of fatigue and exertional dyspnea, only occurring with moderate level exercise.  He does not have any symptoms with his activities of daily living.  He denies chest pain or pressure.  No orthopnea or PND.  No leg swelling or abdominal swelling.  His primary complaint is fatigue and lack of energy.  His wife has noticed a big difference in his energy level over the past year.  Past Medical History:  Diagnosis Date   Coronary artery disease    Glaucoma    History of gallstones    Hyperlipidemia    LVH (left ventricular hypertrophy)    MI (myocardial  infarction) ([place])    MI, old    Mild aortic sclerosis    Mitral regurgitation    Mixed dyslipidemia    Venous stasis     Past Surgical History:  Procedure Laterality Date   CARDIAC CATHETERIZATION  09/09/86   EF 56%   CARDIOVASCULAR STRESS TEST  12/16/2007   EF 54% NO ISCHEMIA   CORONARY ARTERY BYPASS GRAFT     LIMA TO THE LAD AND RIMA TO RIGHT CORONARY ARTERY   TEE WITHOUT CARDIOVERSION N/A 09/02/2020   Procedure: TRANSESOPHAGEAL ECHOCARDIOGRAM (TEE);  Surgeon: Werner Lean, MD;  Location: [facility] ENDOSCOPY;  Service: Cardiovascular;  Laterality: N/A;   US ECHOCARDIOGRAPHY  12/12/2007   EF 55-60%    Current Medications: Current Meds  Medication Sig   aspirin EC 81 MG tablet Take 1 tablet (81 mg total) by mouth daily.   dorzolamide-timolol (COSOPT) 22.3-6.8 MG/ML ophthalmic solution Place 1 drop into both eyes 2 (two) times daily.   ezetimibe (ZETIA) 10 MG tablet TAKE 1 TABLET(10 MG) BY MOUTH DAILY   ferrous sulfate 325 (65 FE) MG tablet Take 325 mg by mouth once a week.   latanoprost (XALATAN) 0.005 % ophthalmic solution Place 1 drop into both eyes at bedtime.    rosuvastatin (CRESTOR) 40 MG tablet Take 40 mg by mouth at bedtime.   triamcinolone cream (KENALOG) 0.1 % Apply 1 application topically 2 (two) times daily as needed (eczema).  Allergies:   Patient has no known allergies.   Social History   Socioeconomic History   Marital status: Married    Spouse name: Not on file   Number of children: Not on file   Years of education: Not on file   Highest education level: Not on file  Occupational History   Not on file  Tobacco Use   Smoking status: Never   Smokeless tobacco: Never  Vaping Use   Vaping Use: Never used  Substance and Sexual Activity   Alcohol use: No   Drug use: No   Sexual activity: Not on file  Other Topics Concern   Not on file  Social History Narrative   Not on file   Social Determinants of Health   Financial Resource Strain: Not on  file  Food Insecurity: Not on file  Transportation Needs: Not on file  Physical Activity: Not on file  Stress: Not on file  Social Connections: Not on file     Family History: The patient's family history includes Heart attack in his father; Stroke in his father.  ROS:   Please see the history of present illness.    All other systems reviewed and are negative.  EKGs/Labs/Other Studies Reviewed:    The following studies were reviewed today: TEE: IMPRESSIONS     1. The mitral valve is abnormal- there is both anterior mitral valve  prolapse and some posterior tetthering that lead to a eccentric mitral  regurgitation; the Coanda effect is exhibited. Severe mitral valve  regurgitation. No evidence of mitral stenosis.   The mean mitral valve gradient is 3.0 mmHg.   2. Left ventricular ejection fraction, by estimation, is 60 to 65%. The  left ventricle has normal function and size.   3. Right ventricular systolic function is normal. The right ventricular  size is normal.   4. No left atrial/left atrial appendage thrombus was detected.   5. The aortic valve is tricuspid. Aortic valve regurgitation is not  visualized. No aortic stenosis is present.   Echo 01/09/2021: 1. Inferior basal hypokinesis . Left ventricular ejection fraction, by  estimation, is 55%. The left ventricle has normal function. The left  ventricle demonstrates regional wall motion abnormalities (see scoring  diagram/findings for description). Left  ventricular diastolic parameters were normal.   2. Right ventricular systolic function is normal. The right ventricular  size is normal.   3. Left atrial size was moderately dilated.   4. Splay artifact severe appearing eccentric posteriorly directed MR ?  ischemic etiology with RWMA in basal inferior wall . The mitral valve is  abnormal. Severe mitral valve regurgitation. No evidence of mitral  stenosis.   5. The aortic valve is tricuspid. There is mild  calcification of the  aortic valve. Aortic valve regurgitation is not visualized. Aortic valve  sclerosis is present, with no evidence of aortic valve stenosis.   6. The inferior vena cava is normal in size with greater than 50%  respiratory variability, suggesting right atrial pressure of 3 mmHg.   EKG:  EKG is not ordered today.    Recent Labs: 06/16/2020: ALT 20; BUN 9; Creatinine, Ser 0.87; Potassium 4.7; Sodium 133  Recent Lipid Panel    Component Value Date/Time   CHOL 136 06/16/2020 0954   TRIG 82 06/16/2020 0954   HDL 34 (L) 06/16/2020 0954   CHOLHDL 4.0 06/16/2020 0954   LDLCALC 86 06/16/2020 0954     Risk Assessment/Calculations:  Physical Exam:    VS:  BP 122/72    Pulse 79    Ht 5\' 11"  (1.803 m)    Wt 169 lb 6.4 oz (76.8 kg)    SpO2 98%    BMI 23.63 kg/m     Wt Readings from Last 3 Encounters:  01/26/21 169 lb 6.4 oz (76.8 kg)  09/02/20 167 lb (75.8 kg)  07/22/20 171 lb (77.6 kg)     GEN:  Well nourished, well developed in no acute distress HEENT: Normal NECK: No JVD; No carotid bruits LYMPHATICS: No lymphadenopathy CARDIAC: RRR, 2/6 holosystolic murmur at the apex RESPIRATORY:  Clear to auscultation without rales, wheezing or rhonchi  ABDOMEN: Soft, non-tender, non-distended MUSCULOSKELETAL:  No edema; No deformity  SKIN: Warm and dry NEUROLOGIC:  Alert and oriented x 3 PSYCHIATRIC:  Normal affect   ASSESSMENT:    1. Nonrheumatic mitral valve regurgitation    PLAN:    In order of problems listed above:  The patient has severe nonrheumatic mitral regurgitation.  I have reviewed his transthoracic and transesophageal echo studies.  He appears to have a combination of posterior leaflet restriction and anterior leaflet override, resulting in severe eccentric mitral regurgitation.  He exhibits New York Heart Association functional class II symptoms of fatigue and exertional dyspnea.  The patient has a longstanding history of ischemic heart  disease and I suspect his posterior leaflet restriction is related to his remote inferior infarct.  Fortunately, he has preserved overall LV function.  We discussed the natural history of mitral regurgitation in the context of the patient's comorbid medical conditions today.  Treatment options include ongoing medical therapy, surgical mitral valve repair, or transcatheter edge-to-edge repair of the mitral valve.  Considering the patient's prior sternotomy and remote CABG, transcatheter edge-to-edge repair of the mitral valve might be a reasonable treatment option.  He appears to have suitable anatomy with adequate posterior leaflet length, no evidence of mitral stenosis, and no significant leaflet calcification.  The patient should have further evaluation with a right and left heart catheterization to make sure that he has continued graft patency and no high-grade obstructive CAD.  This will also give assessment of his cardiac hemodynamics.  Once his cardiac catheterization is completed, he will be referred for cardiac surgical consultation to discuss treatment options as part of a multidisciplinary team approach to his care. I have reviewed the risks, indications, and alternatives to cardiac catheterization, possible angioplasty, and stenting with the patient. Risks include but are not limited to bleeding, infection, vascular injury, stroke, myocardial infection, arrhythmia, kidney injury, radiation-related injury in the case of prolonged fluoroscopy use, emergency cardiac surgery, and death. The patient understands the risks of serious complication is 1-2 in 1610 with diagnostic cardiac cath and 1-2% or less with angioplasty/stenting.  Since the patient's bypass surgery involved bilateral IMA grafts, he will be done via a femoral approach.      Shared Decision Making/Informed Consent The risks [stroke (1 in 1000), death (1 in 1000), kidney failure [usually temporary] (1 in 500), bleeding (1 in 200), allergic  reaction [possibly serious] (1 in 200)], benefits (diagnostic support and management of coronary artery disease) and alternatives of a cardiac catheterization were discussed in detail with Roberto Ponce and he is willing to proceed.    Medication Adjustments/Labs and Tests Ordered: Current medicines are reviewed at length with the patient today.  Concerns regarding medicines are outlined above.  Orders Placed This Encounter  Procedures   CBC   Basic metabolic panel  No orders of the defined types were placed in this encounter.   Patient Instructions  Medication Instructions:  Your physician recommends that you continue on your current medications as directed. Please refer to the Current Medication list given to you today.  *If you need a refill on your cardiac medications before your next appointment, please call your pharmacy*   Lab Work: TODAY:  BMET & CBC  If you have labs (blood work) drawn today and your tests are completely normal, you will receive your results only by: [place] (if you have MyChart) OR A paper copy in the mail If you have any lab test that is abnormal or we need to change your treatment, we will call you to review the results.   Testing/Procedures: Your physician has requested that you have a cardiac catheterization. Cardiac catheterization is used to diagnose and/or treat various heart conditions. Doctors may recommend this procedure for a number of different reasons. The most common reason is to evaluate chest pain. Chest pain can be a symptom of coronary artery disease (CAD), and cardiac catheterization can show whether plaque is narrowing or blocking your hearts arteries. This procedure is also used to evaluate the valves, as well as measure the blood flow and oxygen levels in different parts of your heart. For further information please visit [email or web address]. Please follow instruction sheet, BELOW:   [facility] OFFICE [place], [place] [address] Dept: [phone] Loc: [place]  01/26/2021  You are scheduled for a Cardiac Catheterization on Tuesday, December 27 with Dr. Sherren Mocha.  1. Please arrive at the [facility] (Main Entrance A) at [facility]: [address] [place],  [address] at 12:00 PM (This time is two hours before your procedure to ensure your preparation). Free valet parking service is available.   Special note: Every effort is made to have your procedure done on time. Please understand that emergencies sometimes delay scheduled procedures.  2. Diet: Do not eat solid foods after midnight.  The patient may have clear liquids until 5am upon the day of the procedure.  3. Labs:  WILL BE DONE TODAY  4. Medication instructions in preparation for your procedure:   Contrast Allergy: No   On the morning of your procedure, take your Aspirin and any morning medicines NOT listed above.  You may use sips of water.  5. Plan for one night stay--bring personal belongings. 6. Bring a current list of your medications and current insurance cards. 7. You MUST have a responsible person to drive you home. 8. Someone MUST be with you the first 24 hours after you arrive home or your discharge will be delayed. 9. Please wear clothes that are easy to get on and off and wear slip-on shoes.  Thank you for allowing [place] to care for you!   -- [facility]     Follow-Up: At [facility], you and your health needs are our priority.  As part of our continuing mission to provide you with exceptional heart care, we have created designated Provider Care Teams.  These Care Teams include your primary Cardiologist (physician) and Advanced Practice Providers (APPs -  Physician Assistants and Nurse Practitioners) who all work together to provide you with the care  you need, when you need it.  We recommend signing up for the patient portal called "MyChart".  Sign up information is provided on  this After Visit Summary.  MyChart is used to connect with patients for Virtual Visits (Telemedicine).  Patients are able to view lab/test results, encounter notes, upcoming appointments, etc.  Non-urgent messages can be sent to your provider as well.   To learn more about what you can do with MyChart, go to [email or web address].    Your next appointment:   They will call you  The format for your next appointment:     Provider:       Other Instructions    Signed, Sherren Mocha, MD  01/26/2021 10:20 AM    [place]

## 2021-01-26 NOTE — Progress Notes (Signed)
Cardiology Office Note:    Date:  01/26/2021   ID:  Roberto Ponce, DOB Oct 11, 1950, MRN [id number]  PCP:  Kristen Loader, FNP   [facility] Providers Cardiologist:  Mertie Moores, MD     Referring MD: Kristen Loader, FNP   Chief Complaint  Patient presents with   Mitral Regurgitation     History of Present Illness:    Roberto Ponce is a 70 y.o. male presenting for evaluation of mitral regurgitation, referred by Dr Roberto Ponce.  Patient has a history of ischemic heart disease with old inferior MI, ultimately treated with CABG at age 22 he was grafted with a LIMA to LAD and RIMA to RCA.  The patient has a history of mild mitral regurgitation in the past.  He was noted to have increased dyspnea when he was last seen by Dr. Acie Fredrickson in June of this year.  It was unclear if this was related to previous COVID infection or whether he might have worsening mitral regurgitation.  His echocardiogram demonstrated severe eccentric mitral regurgitation with what appeared to be tethering of the posterior leaflet of the mitral valve.  He underwent transesophageal echo in July confirming restriction of the posterior leaflet and anterior leaflet override with severe mitral regurgitation.  The patient is referred today for further discussion of treatment options.  He is here with his wife today.  He remains relatively active with mild symptoms of fatigue and exertional dyspnea, only occurring with moderate level exercise.  He does not have any symptoms with his activities of daily living.  He denies chest pain or pressure.  No orthopnea or PND.  No leg swelling or abdominal swelling.  His primary complaint is fatigue and lack of energy.  His wife has noticed a big difference in his energy level over the past year.  Past Medical History:  Diagnosis Date   Coronary artery disease    Glaucoma    History of gallstones    Hyperlipidemia    LVH (left ventricular hypertrophy)    MI (myocardial  infarction) ([place])    MI, old    Mild aortic sclerosis    Mitral regurgitation    Mixed dyslipidemia    Venous stasis     Past Surgical History:  Procedure Laterality Date   CARDIAC CATHETERIZATION  09/09/86   EF 56%   CARDIOVASCULAR STRESS TEST  12/16/2007   EF 54% NO ISCHEMIA   CORONARY ARTERY BYPASS GRAFT     LIMA TO THE LAD AND RIMA TO RIGHT CORONARY ARTERY   TEE WITHOUT CARDIOVERSION N/A 09/02/2020   Procedure: TRANSESOPHAGEAL ECHOCARDIOGRAM (TEE);  Surgeon: Roberto Lean, MD;  Location: [facility] ENDOSCOPY;  Service: Cardiovascular;  Laterality: N/A;   US ECHOCARDIOGRAPHY  12/12/2007   EF 55-60%    Current Medications: Current Meds  Medication Sig   aspirin EC 81 MG tablet Take 1 tablet (81 mg total) by mouth daily.   dorzolamide-timolol (COSOPT) 22.3-6.8 MG/ML ophthalmic solution Place 1 drop into both eyes 2 (two) times daily.   ezetimibe (ZETIA) 10 MG tablet TAKE 1 TABLET(10 MG) BY MOUTH DAILY   ferrous sulfate 325 (65 FE) MG tablet Take 325 mg by mouth once a week.   latanoprost (XALATAN) 0.005 % ophthalmic solution Place 1 drop into both eyes at bedtime.    rosuvastatin (CRESTOR) 40 MG tablet Take 40 mg by mouth at bedtime.   triamcinolone cream (KENALOG) 0.1 % Apply 1 application topically 2 (two) times daily as needed (eczema).  Allergies:   Patient has no known allergies.   Social History   Socioeconomic History   Marital status: Married    Spouse name: Not on file   Number of children: Not on file   Years of education: Not on file   Highest education level: Not on file  Occupational History   Not on file  Tobacco Use   Smoking status: Never   Smokeless tobacco: Never  Vaping Use   Vaping Use: Never used  Substance and Sexual Activity   Alcohol use: No   Drug use: No   Sexual activity: Not on file  Other Topics Concern   Not on file  Social History Narrative   Not on file   Social Determinants of Health   Financial Resource Strain: Not on  file  Food Insecurity: Not on file  Transportation Needs: Not on file  Physical Activity: Not on file  Stress: Not on file  Social Connections: Not on file     Family History: The patient's family history includes Heart attack in his father; Stroke in his father.  ROS:   Please see the history of present illness.    All other systems reviewed and are negative.  EKGs/Labs/Other Studies Reviewed:    The following studies were reviewed today: TEE: IMPRESSIONS     1. The mitral valve is abnormal- there is both anterior mitral valve  prolapse and some posterior tetthering that lead to a eccentric mitral  regurgitation; the Coanda effect is exhibited. Severe mitral valve  regurgitation. No evidence of mitral stenosis.   The mean mitral valve gradient is 3.0 mmHg.   2. Left ventricular ejection fraction, by estimation, is 60 to 65%. The  left ventricle has normal function and size.   3. Right ventricular systolic function is normal. The right ventricular  size is normal.   4. No left atrial/left atrial appendage thrombus was detected.   5. The aortic valve is tricuspid. Aortic valve regurgitation is not  visualized. No aortic stenosis is present.   Echo 01/09/2021: 1. Inferior basal hypokinesis . Left ventricular ejection fraction, by  estimation, is 55%. The left ventricle has normal function. The left  ventricle demonstrates regional wall motion abnormalities (see scoring  diagram/findings for description). Left  ventricular diastolic parameters were normal.   2. Right ventricular systolic function is normal. The right ventricular  size is normal.   3. Left atrial size was moderately dilated.   4. Splay artifact severe appearing eccentric posteriorly directed MR ?  ischemic etiology with RWMA in basal inferior wall . The mitral valve is  abnormal. Severe mitral valve regurgitation. No evidence of mitral  stenosis.   5. The aortic valve is tricuspid. There is mild  calcification of the  aortic valve. Aortic valve regurgitation is not visualized. Aortic valve  sclerosis is present, with no evidence of aortic valve stenosis.   6. The inferior vena cava is normal in size with greater than 50%  respiratory variability, suggesting right atrial pressure of 3 mmHg.   EKG:  EKG is not ordered today.    Recent Labs: 06/16/2020: ALT 20; BUN 9; Creatinine, Ser 0.87; Potassium 4.7; Sodium 133  Recent Lipid Panel    Component Value Date/Time   CHOL 136 06/16/2020 0954   TRIG 82 06/16/2020 0954   HDL 34 (L) 06/16/2020 0954   CHOLHDL 4.0 06/16/2020 0954   LDLCALC 86 06/16/2020 0954     Risk Assessment/Calculations:  Physical Exam:    VS:  BP 122/72   Pulse 79   Ht 5\' 11"  (1.803 m)   Wt 169 lb 6.4 oz (76.8 kg)   SpO2 98%   BMI 23.63 kg/m     Wt Readings from Last 3 Encounters:  01/26/21 169 lb 6.4 oz (76.8 kg)  09/02/20 167 lb (75.8 kg)  07/22/20 171 lb (77.6 kg)     GEN:  Well nourished, well developed in no acute distress HEENT: Normal NECK: No JVD; No carotid bruits LYMPHATICS: No lymphadenopathy CARDIAC: RRR, 2/6 holosystolic murmur at the apex RESPIRATORY:  Clear to auscultation without rales, wheezing or rhonchi  ABDOMEN: Soft, non-tender, non-distended MUSCULOSKELETAL:  No edema; No deformity  SKIN: Warm and dry NEUROLOGIC:  Alert and oriented x 3 PSYCHIATRIC:  Normal affect   ASSESSMENT:    1. Nonrheumatic mitral valve regurgitation    PLAN:    In order of problems listed above:  The patient has severe nonrheumatic mitral regurgitation.  I have reviewed his transthoracic and transesophageal echo studies.  He appears to have a combination of posterior leaflet restriction and anterior leaflet override, resulting in severe eccentric mitral regurgitation.  He exhibits New York Heart Association functional class II symptoms of fatigue and exertional dyspnea.  The patient has a longstanding history of ischemic heart  disease and I suspect his posterior leaflet restriction is related to his remote inferior infarct.  Fortunately, he has preserved overall LV function.  We discussed the natural history of mitral regurgitation in the context of the patient's comorbid medical conditions today.  Treatment options include ongoing medical therapy, surgical mitral valve repair, or transcatheter edge-to-edge repair of the mitral valve.  Considering the patient's prior sternotomy and remote CABG, transcatheter edge-to-edge repair of the mitral valve might be a reasonable treatment option.  He appears to have suitable anatomy with adequate posterior leaflet length, no evidence of mitral stenosis, and no significant leaflet calcification.  The patient should have further evaluation with a right and left heart catheterization to make sure that he has continued graft patency and no high-grade obstructive CAD.  This will also give assessment of his cardiac hemodynamics.  Once his cardiac catheterization is completed, he will be referred for cardiac surgical consultation to discuss treatment options as part of a multidisciplinary team approach to his care. I have reviewed the risks, indications, and alternatives to cardiac catheterization, possible angioplasty, and stenting with the patient. Risks include but are not limited to bleeding, infection, vascular injury, stroke, myocardial infection, arrhythmia, kidney injury, radiation-related injury in the case of prolonged fluoroscopy use, emergency cardiac surgery, and death. The patient understands the risks of serious complication is 1-2 in 7262 with diagnostic cardiac cath and 1-2% or less with angioplasty/stenting.  Since the patient's bypass surgery involved bilateral IMA grafts, he will be done via a femoral approach.      Shared Decision Making/Informed Consent The risks [stroke (1 in 1000), death (1 in 1000), kidney failure [usually temporary] (1 in 500), bleeding (1 in 200), allergic  reaction [possibly serious] (1 in 200)], benefits (diagnostic support and management of coronary artery disease) and alternatives of a cardiac catheterization were discussed in detail with Mr. Russman and he is willing to proceed.    Medication Adjustments/Labs and Tests Ordered: Current medicines are reviewed at length with the patient today.  Concerns regarding medicines are outlined above.  Orders Placed This Encounter  Procedures   CBC   Basic metabolic panel   No orders of  the defined types were placed in this encounter.   Patient Instructions  Medication Instructions:  Your physician recommends that you continue on your current medications as directed. Please refer to the Current Medication list given to you today.  *If you need a refill on your cardiac medications before your next appointment, please call your pharmacy*   Lab Work: TODAY:  BMET & CBC  If you have labs (blood work) drawn today and your tests are completely normal, you will receive your results only by: [place] (if you have MyChart) OR A paper copy in the mail If you have any lab test that is abnormal or we need to change your treatment, we will call you to review the results.   Testing/Procedures: Your physician has requested that you have a cardiac catheterization. Cardiac catheterization is used to diagnose and/or treat various heart conditions. Doctors may recommend this procedure for a number of different reasons. The most common reason is to evaluate chest pain. Chest pain can be a symptom of coronary artery disease (CAD), and cardiac catheterization can show whether plaque is narrowing or blocking your heart's arteries. This procedure is also used to evaluate the valves, as well as measure the blood flow and oxygen levels in different parts of your heart. For further information please visit [email or web address]. Please follow instruction sheet, BELOW:   [place] OFFICE [place], [place] [address] Dept: [phone] Loc: [place]  01/26/2021  You are scheduled for a Cardiac Catheterization on Tuesday, December 27 with Dr. Sherren Mocha.  1. Please arrive at the [facility] (Main Entrance A) at [facility]: [address] [place], [place] [address] at 12:00 PM (This time is two hours before your procedure to ensure your preparation). Free valet parking service is available.   Special note: Every effort is made to have your procedure done on time. Please understand that emergencies sometimes delay scheduled procedures.  2. Diet: Do not eat solid foods after midnight.  The patient may have clear liquids until 5am upon the day of the procedure.  3. Labs:  WILL BE DONE TODAY  4. Medication instructions in preparation for your procedure:   Contrast Allergy: No   On the morning of your procedure, take your Aspirin and any morning medicines NOT listed above.  You may use sips of water.  5. Plan for one night stay--bring personal belongings. 6. Bring a current list of your medications and current insurance cards. 7. You MUST have a responsible person to drive you home. 8. Someone MUST be with you the first 24 hours after you arrive home or your discharge will be delayed. 9. Please wear clothes that are easy to get on and off and wear slip-on shoes.  Thank you for allowing [place] to care for you!   -- [place] [facility]     Follow-Up: At [facility], you and your health needs are our priority.  As part of our continuing mission to provide you with exceptional heart care, we have created designated Provider Care Teams.  These Care Teams include your primary Cardiologist (physician) and Advanced Practice Providers (APPs -  Physician Assistants and Nurse Practitioners) who all work together to provide you with the care  you need, when you need it.  We recommend signing up for the patient portal called "MyChart".  Sign up information is provided on this After Visit  Summary.  MyChart is used to connect with patients for Virtual Visits (Telemedicine).  Patients are able to view lab/test results, encounter notes, upcoming appointments, etc.  Non-urgent messages can be sent to your provider as well.   To learn more about what you can do with MyChart, go to [email or web address].    Your next appointment:   They will call you  The format for your next appointment:     Provider:       Other Instructions    Signed, Sherren Mocha, MD  01/26/2021 10:20 AM    [place]

## 2021-01-26 NOTE — Patient Instructions (Signed)
Medication Instructions:  Your physician recommends that you continue on your current medications as directed. Please refer to the Current Medication list given to you today.  *If you need a refill on your cardiac medications before your next appointment, please call your pharmacy*   Lab Work: TODAY:  BMET & CBC  If you have labs (blood work) drawn today and your tests are completely normal, you will receive your results only by: [place] (if you have MyChart) OR A paper copy in the mail If you have any lab test that is abnormal or we need to change your treatment, we will call you to review the results.   Testing/Procedures: Your physician has requested that you have a cardiac catheterization. Cardiac catheterization is used to diagnose and/or treat various heart conditions. Doctors may recommend this procedure for a number of different reasons. The most common reason is to evaluate chest pain. Chest pain can be a symptom of coronary artery disease (CAD), and cardiac catheterization can show whether plaque is narrowing or blocking your heart's arteries. This procedure is also used to evaluate the valves, as well as measure the blood flow and oxygen levels in different parts of your heart. For further information please visit [email or web address]. Please follow instruction sheet, BELOW:   [place] OFFICE [place], [place] [address] Dept: [phone] Loc: [place]  01/26/2021  You are scheduled for a Cardiac Catheterization on Tuesday, December 27 with Dr. Sherren Mocha.  1. Please arrive at the [facility] (Main Entrance A) at [facility]: [address] [place], [place] [address] at 12:00 PM (This time is two hours before your procedure to ensure your preparation). Free valet parking service is available.   Special note: Every effort is made to  have your procedure done on time. Please understand that emergencies sometimes delay scheduled procedures.  2. Diet: Do not eat solid foods after midnight.  The patient may have clear liquids until 5am upon the day of the procedure.  3. Labs:  WILL BE DONE TODAY  4. Medication instructions in preparation for your procedure:   Contrast Allergy: No   On the morning of your procedure, take your Aspirin and any morning medicines NOT listed above.  You may use sips of water.  5. Plan for one night stay--bring personal belongings. 6. Bring a current list of your medications and current insurance cards. 7. You MUST have a responsible person to drive you home. 8. Someone MUST be with you the first 24 hours after you arrive home or your discharge will be delayed. 9. Please wear clothes that are easy to get on and off and wear slip-on shoes.  Thank you for allowing [place] to care for you!   -- [place] [facility]     Follow-Up: At [facility], you and your health needs are our priority.  As part of our continuing mission to provide you with exceptional heart care, we have created designated Provider Care Teams.  These Care Teams include your primary Cardiologist (physician) and Advanced Practice Providers (APPs -  Physician Assistants and Nurse Practitioners) who all work together to provide you with the care you need, when you need it.  We recommend signing up for the patient portal called "MyChart".  Sign up information is provided on this After Visit Summary.  MyChart is used to connect with patients for Virtual Visits (Telemedicine).  Patients are able to view lab/test results, encounter notes, upcoming appointments, etc.  Non-urgent messages can be sent to your provider as well.   To learn more about what you can do with MyChart, go to [email or web address].    Your next appointment:   They will call you  The format for your next appointment:     Provider:        Other Instructions

## 2021-02-01 ENCOUNTER — Ambulatory Visit
Admission: RE | Admit: 2021-02-01 | Discharge: 2021-02-01 | Disposition: A | Discharge status: Home / Self Care | Payer: PPO | Source: Ambulatory Visit | Attending: Family Medicine | Admitting: Family Medicine

## 2021-02-01 DIAGNOSIS — N433 Hydrocele, unspecified: Secondary | ICD-10-CM | POA: Diagnosis not present

## 2021-02-01 DIAGNOSIS — N5089 Other specified disorders of the male genital organs: Secondary | ICD-10-CM

## 2021-02-01 DIAGNOSIS — N503 Cyst of epididymis: Secondary | ICD-10-CM | POA: Diagnosis not present

## 2021-02-09 ENCOUNTER — Telehealth: Payer: Self-pay | Admitting: *Deleted

## 2021-02-09 NOTE — Telephone Encounter (Addendum)
Cardiac catheterization scheduled at [facility] for: Tuesday February 14, 2021 2 PM [facility] Main Entrance A [facility]) at: 12 Noon   Diet-no solid food after midnight prior to cath, clear liquids until 5 AM day of procedure.  Medication instructions for procedure: -Usual morning medications can be taken pre-cath with sips of water including aspirin 81 mg.    Confirmed patient has responsible adult to drive home post procedure and be with patient first 24 hours after arriving home.  [facility] does allow one visitor to accompany you and wait in the hospital waiting room while you are there for your procedure. You and your visitor will be asked to wear a mask once you enter the hospital.   Patient reports does not currently have any new symptoms concerning for COVID-19 and no household members with COVID-19 like illness.       Reviewed procedure/mask/visitor instructions with patient.

## 2021-02-14 ENCOUNTER — Other Ambulatory Visit: Payer: Self-pay

## 2021-02-14 ENCOUNTER — Ambulatory Visit (HOSPITAL_COMMUNITY)
Admission: RE | Disposition: A | Discharge status: Home / Self Care | Payer: Self-pay | Source: Home / Self Care | Attending: Cardiovascular Disease

## 2021-02-14 ENCOUNTER — Ambulatory Visit (HOSPITAL_COMMUNITY)
Admission: RE | Admit: 2021-02-14 | Discharge: 2021-02-15 | Disposition: A | Discharge status: Home / Self Care | Payer: PPO | Attending: Cardiovascular Disease | Admitting: Cardiovascular Disease

## 2021-02-14 DIAGNOSIS — R0609 Other forms of dyspnea: Secondary | ICD-10-CM | POA: Diagnosis not present

## 2021-02-14 DIAGNOSIS — Z955 Presence of coronary angioplasty implant and graft: Secondary | ICD-10-CM | POA: Diagnosis not present

## 2021-02-14 DIAGNOSIS — E785 Hyperlipidemia, unspecified: Secondary | ICD-10-CM | POA: Diagnosis present

## 2021-02-14 DIAGNOSIS — I340 Nonrheumatic mitral (valve) insufficiency: Secondary | ICD-10-CM | POA: Diagnosis not present

## 2021-02-14 DIAGNOSIS — Z8616 Personal history of COVID-19: Secondary | ICD-10-CM | POA: Insufficient documentation

## 2021-02-14 DIAGNOSIS — Z79899 Other long term (current) drug therapy: Secondary | ICD-10-CM | POA: Diagnosis not present

## 2021-02-14 DIAGNOSIS — I2582 Chronic total occlusion of coronary artery: Secondary | ICD-10-CM | POA: Diagnosis not present

## 2021-02-14 DIAGNOSIS — E782 Mixed hyperlipidemia: Secondary | ICD-10-CM | POA: Diagnosis present

## 2021-02-14 DIAGNOSIS — I252 Old myocardial infarction: Secondary | ICD-10-CM | POA: Diagnosis not present

## 2021-02-14 DIAGNOSIS — I25810 Atherosclerosis of coronary artery bypass graft(s) without angina pectoris: Secondary | ICD-10-CM | POA: Diagnosis not present

## 2021-02-14 DIAGNOSIS — I2510 Atherosclerotic heart disease of native coronary artery without angina pectoris: Secondary | ICD-10-CM

## 2021-02-14 DIAGNOSIS — Z951 Presence of aortocoronary bypass graft: Secondary | ICD-10-CM

## 2021-02-14 HISTORY — PX: RIGHT/LEFT HEART CATH AND CORONARY/GRAFT ANGIOGRAPHY: CATH118267

## 2021-02-14 HISTORY — PX: CORONARY STENT INTERVENTION: CATH118234

## 2021-02-14 LAB — POCT I-STAT EG7
Acid-base deficit: 1 mmol/L (ref 0.0–2.0)
Acid-base deficit: 1 mmol/L (ref 0.0–2.0)
Bicarbonate: 26 mmol/L (ref 20.0–28.0)
Bicarbonate: 26.5 mmol/L (ref 20.0–28.0)
Calcium, Ion: 1.22 mmol/L (ref 1.15–1.40)
Calcium, Ion: 1.26 mmol/L (ref 1.15–1.40)
HCT: 35 % — ABNORMAL LOW (ref 39.0–52.0)
HCT: 35 % — ABNORMAL LOW (ref 39.0–52.0)
Hemoglobin: 11.9 g/dL — ABNORMAL LOW (ref 13.0–17.0)
Hemoglobin: 11.9 g/dL — ABNORMAL LOW (ref 13.0–17.0)
O2 Saturation: 79 %
O2 Saturation: 99 %
Potassium: 3.8 mmol/L (ref 3.5–5.1)
Potassium: 3.9 mmol/L (ref 3.5–5.1)
Sodium: 139 mmol/L (ref 135–145)
Sodium: 139 mmol/L (ref 135–145)
TCO2: 27 mmol/L (ref 22–32)
TCO2: 28 mmol/L (ref 22–32)
pCO2, Ven: 50 mmHg (ref 44.0–60.0)
pCO2, Ven: 55.5 mmHg (ref 44.0–60.0)
pH, Ven: 7.286 (ref 7.250–7.430)
pH, Ven: 7.323 (ref 7.250–7.430)
pO2, Ven: 141 mmHg — ABNORMAL HIGH (ref 32.0–45.0)
pO2, Ven: 48 mmHg — ABNORMAL HIGH (ref 32.0–45.0)

## 2021-02-14 LAB — POCT ACTIVATED CLOTTING TIME: Activated Clotting Time: 468 seconds

## 2021-02-14 SURGERY — RIGHT/LEFT HEART CATH AND CORONARY/GRAFT ANGIOGRAPHY
Anesthesia: LOCAL

## 2021-02-14 MED ORDER — CLOPIDOGREL BISULFATE 75 MG PO TABS
75.0000 mg | ORAL_TABLET | Freq: Every day | ORAL | Status: DC
  Administered 2021-02-15: 10:00:00 75 mg via ORAL
  Filled 2021-02-14: qty 1

## 2021-02-14 MED ORDER — LIDOCAINE HCL (PF) 1 % IJ SOLN
INTRAMUSCULAR | Status: AC
  Filled 2021-02-14: qty 30

## 2021-02-14 MED ORDER — DORZOLAMIDE HCL-TIMOLOL MAL 2-0.5 % OP SOLN
1.0000 [drp] | Freq: Two times a day (BID) | OPHTHALMIC | Status: DC
  Administered 2021-02-15 (×2): 1 [drp] via OPHTHALMIC
  Filled 2021-02-14 (×2): qty 10

## 2021-02-14 MED ORDER — ASPIRIN EC 81 MG PO TBEC
81.0000 mg | DELAYED_RELEASE_TABLET | Freq: Every day | ORAL | Status: DC
  Administered 2021-02-15: 10:00:00 81 mg via ORAL
  Filled 2021-02-14: qty 1

## 2021-02-14 MED ORDER — LATANOPROST 0.005 % OP SOLN
1.0000 [drp] | Freq: Every day | OPHTHALMIC | Status: DC
  Administered 2021-02-15: 01:00:00 1 [drp] via OPHTHALMIC
  Filled 2021-02-14 (×2): qty 2.5

## 2021-02-14 MED ORDER — MIDAZOLAM HCL 2 MG/2ML IJ SOLN
INTRAMUSCULAR | Status: AC
  Filled 2021-02-14: qty 2

## 2021-02-14 MED ORDER — ONDANSETRON HCL 4 MG/2ML IJ SOLN: 4.0000 mg | Freq: Four times a day (QID) | INTRAMUSCULAR | Status: DC | PRN

## 2021-02-14 MED ORDER — SODIUM CHLORIDE 0.9% FLUSH
3.0000 mL | Freq: Two times a day (BID) | INTRAVENOUS | Status: DC
  Administered 2021-02-15 (×2): 3 mL via INTRAVENOUS

## 2021-02-14 MED ORDER — FAMOTIDINE IN NACL 20-0.9 MG/50ML-% IV SOLN
INTRAVENOUS | Status: AC | PRN
  Administered 2021-02-14: 20 mg via INTRAVENOUS

## 2021-02-14 MED ORDER — BIVALIRUDIN BOLUS VIA INFUSION - CUPID
INTRAVENOUS | Status: DC | PRN
  Administered 2021-02-14: 16:00:00 55.8 mg via INTRAVENOUS

## 2021-02-14 MED ORDER — SODIUM CHLORIDE 0.9% FLUSH: 3.0000 mL | INTRAVENOUS | Status: DC | PRN

## 2021-02-14 MED ORDER — SODIUM CHLORIDE 0.9 % WEIGHT BASED INFUSION: 1.0000 mL/kg/h | INTRAVENOUS | Status: DC

## 2021-02-14 MED ORDER — LIDOCAINE HCL (PF) 1 % IJ SOLN
INTRAMUSCULAR | Status: DC | PRN
  Administered 2021-02-14: 10 mL

## 2021-02-14 MED ORDER — CLOPIDOGREL BISULFATE 300 MG PO TABS
ORAL_TABLET | ORAL | Status: DC | PRN
  Administered 2021-02-14: 600 mg via ORAL

## 2021-02-14 MED ORDER — FAMOTIDINE IN NACL 20-0.9 MG/50ML-% IV SOLN
INTRAVENOUS | Status: AC
  Filled 2021-02-14: qty 50

## 2021-02-14 MED ORDER — SODIUM CHLORIDE 0.9 % IV SOLN: INTRAVENOUS | Status: DC | PRN

## 2021-02-14 MED ORDER — CLOPIDOGREL BISULFATE 300 MG PO TABS
ORAL_TABLET | ORAL | Status: AC
  Filled 2021-02-14: qty 2

## 2021-02-14 MED ORDER — FENTANYL CITRATE (PF) 100 MCG/2ML IJ SOLN
INTRAMUSCULAR | Status: DC | PRN
  Administered 2021-02-14 (×2): 25 ug via INTRAVENOUS

## 2021-02-14 MED ORDER — HEPARIN (PORCINE) IN NACL 1000-0.9 UT/500ML-% IV SOLN
INTRAVENOUS | Status: AC
  Filled 2021-02-14: qty 1000

## 2021-02-14 MED ORDER — BRIMONIDINE TARTRATE 0.2 % OP SOLN
1.0000 [drp] | Freq: Two times a day (BID) | OPHTHALMIC | Status: DC
  Administered 2021-02-15 (×2): 1 [drp] via OPHTHALMIC
  Filled 2021-02-14 (×2): qty 5

## 2021-02-14 MED ORDER — BIVALIRUDIN TRIFLUOROACETATE 250 MG IV SOLR
INTRAVENOUS | Status: AC
  Filled 2021-02-14: qty 250

## 2021-02-14 MED ORDER — ROSUVASTATIN CALCIUM 20 MG PO TABS
40.0000 mg | ORAL_TABLET | Freq: Every day | ORAL | Status: DC
  Administered 2021-02-14: 23:00:00 40 mg via ORAL
  Filled 2021-02-14: qty 2

## 2021-02-14 MED ORDER — FENTANYL CITRATE (PF) 100 MCG/2ML IJ SOLN
INTRAMUSCULAR | Status: AC
  Filled 2021-02-14: qty 2

## 2021-02-14 MED ORDER — HEPARIN (PORCINE) IN NACL 1000-0.9 UT/500ML-% IV SOLN
INTRAVENOUS | Status: DC | PRN
  Administered 2021-02-14 (×2): 500 mL

## 2021-02-14 MED ORDER — SODIUM CHLORIDE 0.9 % IV SOLN: 250.0000 mL | INTRAVENOUS | Status: DC | PRN

## 2021-02-14 MED ORDER — SODIUM CHLORIDE 0.9% FLUSH: 3.0000 mL | Freq: Two times a day (BID) | INTRAVENOUS | Status: DC

## 2021-02-14 MED ORDER — ACETAMINOPHEN 325 MG PO TABS: 650.0000 mg | ORAL_TABLET | ORAL | Status: DC | PRN

## 2021-02-14 MED ORDER — ASPIRIN 81 MG PO CHEW: 81.0000 mg | CHEWABLE_TABLET | ORAL | Status: DC

## 2021-02-14 MED ORDER — SODIUM CHLORIDE 0.9 % WEIGHT BASED INFUSION
3.0000 mL/kg/h | INTRAVENOUS | Status: DC
  Administered 2021-02-14: 13:00:00 3 mL/kg/h via INTRAVENOUS

## 2021-02-14 MED ORDER — MIDAZOLAM HCL 2 MG/2ML IJ SOLN
INTRAMUSCULAR | Status: DC | PRN
  Administered 2021-02-14 (×2): 1 mg via INTRAVENOUS

## 2021-02-14 MED ORDER — SODIUM CHLORIDE 0.9 % WEIGHT BASED INFUSION
1.0000 mL/kg/h | INTRAVENOUS | Status: DC
  Administered 2021-02-15: 1 mL/kg/h via INTRAVENOUS

## 2021-02-14 MED ORDER — HYDRALAZINE HCL 20 MG/ML IJ SOLN: 10.0000 mg | INTRAMUSCULAR | Status: DC | PRN

## 2021-02-14 MED ORDER — IOHEXOL 350 MG/ML SOLN
INTRAVENOUS | Status: DC | PRN
  Administered 2021-02-14: 16:00:00 120 mL

## 2021-02-14 MED ORDER — LABETALOL HCL 5 MG/ML IV SOLN: 10.0000 mg | INTRAVENOUS | Status: DC | PRN

## 2021-02-14 MED ORDER — EZETIMIBE 10 MG PO TABS
10.0000 mg | ORAL_TABLET | Freq: Every day | ORAL | Status: DC
  Administered 2021-02-14 – 2021-02-15 (×2): 10 mg via ORAL
  Filled 2021-02-14 (×2): qty 1

## 2021-02-14 SURGICAL SUPPLY — 23 items
BALLN EUPHORA RX 2.5X12 (BALLOONS) ×3
BALLN SAPPHIRE ~~LOC~~ 3.25X12 (BALLOONS) ×2 IMPLANT
BALLOON EUPHORA RX 2.5X12 (BALLOONS) IMPLANT
CATH INFINITI 5 FR IM (CATHETERS) ×2 IMPLANT
CATH INFINITI 5FR MULTPACK ANG (CATHETERS) ×2 IMPLANT
CATH LAUNCHER 6FR EBU3.5 (CATHETERS) ×2 IMPLANT
CATH SWAN GANZ 7F STRAIGHT (CATHETERS) ×2 IMPLANT
CLOSURE PERCLOSE PROSTYLE (VASCULAR PRODUCTS) ×4 IMPLANT
GUIDEWIRE INQWIRE 1.5J.035X260 (WIRE) IMPLANT
INQWIRE 1.5J .035X260CM (WIRE) ×3
KIT HEART LEFT (KITS) ×3 IMPLANT
PACK CARDIAC CATHETERIZATION (CUSTOM PROCEDURE TRAY) ×3 IMPLANT
SHEATH PINNACLE 5F 10CM (SHEATH) ×2 IMPLANT
SHEATH PINNACLE 6F 10CM (SHEATH) ×2 IMPLANT
SHEATH PINNACLE 7F 10CM (SHEATH) ×2 IMPLANT
SHEATH PROBE COVER 6X72 (BAG) ×2 IMPLANT
STENT ONYX FRONTIER 3.0X15 (Permanent Stent) ×2 IMPLANT
SYR MEDRAD MARK 7 150ML (SYRINGE) ×3 IMPLANT
TRANSDUCER W/STOPCOCK (MISCELLANEOUS) ×3 IMPLANT
TUBING CIL FLEX 10 FLL-RA (TUBING) ×3 IMPLANT
WIRE COUGAR XT STRL 190CM (WIRE) ×2 IMPLANT
WIRE EMERALD 3MM-J .035X150CM (WIRE) ×2 IMPLANT
WIRE MICRO SET SILHO 5FR 7 (SHEATH) ×2 IMPLANT

## 2021-02-14 NOTE — Interval H&P Note (Signed)
History and Physical Interval Note:  02/14/2021 2:46 PM  [name]  has presented today for surgery, with the diagnosis of mitraclip consult.  The various methods of treatment have been discussed with the patient and family. After consideration of risks, benefits and other options for treatment, the patient has consented to  Procedure(s): RIGHT/LEFT HEART CATH AND CORONARY/GRAFT ANGIOGRAPHY (N/A) as a surgical intervention.  The patient's history has been reviewed, patient examined, no change in status, stable for surgery.  I have reviewed the patient's chart and labs.  Questions were answered to the patient's satisfaction.     Roberto Ponce

## 2021-02-15 ENCOUNTER — Encounter (HOSPITAL_COMMUNITY): Payer: Self-pay | Admitting: Cardiovascular Disease

## 2021-02-15 ENCOUNTER — Other Ambulatory Visit (HOSPITAL_COMMUNITY): Payer: Self-pay

## 2021-02-15 DIAGNOSIS — Z951 Presence of aortocoronary bypass graft: Secondary | ICD-10-CM

## 2021-02-15 DIAGNOSIS — Z8616 Personal history of COVID-19: Secondary | ICD-10-CM | POA: Diagnosis not present

## 2021-02-15 DIAGNOSIS — R0609 Other forms of dyspnea: Secondary | ICD-10-CM | POA: Diagnosis not present

## 2021-02-15 DIAGNOSIS — Z79899 Other long term (current) drug therapy: Secondary | ICD-10-CM | POA: Diagnosis not present

## 2021-02-15 DIAGNOSIS — E782 Mixed hyperlipidemia: Secondary | ICD-10-CM | POA: Diagnosis not present

## 2021-02-15 DIAGNOSIS — I2582 Chronic total occlusion of coronary artery: Secondary | ICD-10-CM | POA: Diagnosis not present

## 2021-02-15 DIAGNOSIS — Z955 Presence of coronary angioplasty implant and graft: Secondary | ICD-10-CM | POA: Diagnosis not present

## 2021-02-15 DIAGNOSIS — I200 Unstable angina: Secondary | ICD-10-CM

## 2021-02-15 DIAGNOSIS — I25810 Atherosclerosis of coronary artery bypass graft(s) without angina pectoris: Secondary | ICD-10-CM | POA: Diagnosis not present

## 2021-02-15 DIAGNOSIS — I252 Old myocardial infarction: Secondary | ICD-10-CM | POA: Diagnosis not present

## 2021-02-15 DIAGNOSIS — I340 Nonrheumatic mitral (valve) insufficiency: Secondary | ICD-10-CM | POA: Diagnosis not present

## 2021-02-15 LAB — BASIC METABOLIC PANEL
Anion gap: 7 (ref 5–15)
BUN: 9 mg/dL (ref 8–23)
CO2: 24 mmol/L (ref 22–32)
Calcium: 8.3 mg/dL — ABNORMAL LOW (ref 8.9–10.3)
Chloride: 101 mmol/L (ref 98–111)
Creatinine, Ser: 0.67 mg/dL (ref 0.61–1.24)
GFR, Estimated: >60 mL/min (ref >60)
Glucose, Bld: 108 mg/dL — ABNORMAL HIGH (ref 70–99)
Potassium: 3.7 mmol/L (ref 3.5–5.1)
Sodium: 132 mmol/L — ABNORMAL LOW (ref 135–145)

## 2021-02-15 LAB — CBC
HCT: 35.5 % — ABNORMAL LOW (ref 39.0–52.0)
Hemoglobin: 12.2 g/dL — ABNORMAL LOW (ref 13.0–17.0)
MCH: 30.3 pg (ref 26.0–34.0)
MCHC: 34.4 g/dL (ref 30.0–36.0)
MCV: 88.1 fL (ref 80.0–100.0)
Platelets: 187 10*3/uL (ref 150–400)
RBC: 4.03 MIL/uL — ABNORMAL LOW (ref 4.22–5.81)
RDW: 13.2 % (ref 11.5–15.5)
WBC: 7.5 10*3/uL (ref 4.0–10.5)
nRBC: 0 % (ref 0.0–0.2)

## 2021-02-15 MED ORDER — CLOPIDOGREL BISULFATE 75 MG PO TABS
75.0000 mg | ORAL_TABLET | Freq: Every day | ORAL | 3 refills | Status: DC
  Filled 2021-02-15: qty 90, 90d supply, fill #0

## 2021-02-15 MED ORDER — NITROGLYCERIN 0.4 MG SL SUBL
0.4000 mg | SUBLINGUAL_TABLET | SUBLINGUAL | 3 refills | Status: AC | PRN
  Filled 2021-02-15: qty 25, 7d supply, fill #0

## 2021-02-15 MED FILL — Bivalirudin Trifluoroacetate For IV Soln 250 MG (Base Equiv): INTRAVENOUS | Qty: 250 | Status: AC

## 2021-02-15 NOTE — Discharge Instructions (Addendum)
Medication Changes: START Plavix 75mg  daily in addition to the Aspirin 81mg  daily which you are already taking. These medications are very important in keeping the new stent in your heart open.  Post Cardiac Catheterization: NO HEAVY LIFTING OR SEXUAL ACTIVITY X 7 DAYS. NO DRIVING X 3-5 DAYS. NO SOAKING BATHS, HOT TUBS, POOLS, ETC., X 7 DAYS.  Groin Site Care: Refer to this sheet in the next few weeks. These instructions provide you with information on caring for yourself after your procedure. Your caregiver may also give you more specific instructions. Your treatment has been planned according to current medical practices, but problems sometimes occur. Call your caregiver if you have any problems or questions after your procedure. HOME CARE INSTRUCTIONS You may shower 24 hours after the procedure. Remove the bandage (dressing) and gently wash the site with plain soap and water. Gently pat the site dry.  Do not apply powder or lotion to the site.  Do not sit in a bathtub, swimming pool, or whirlpool for 5 to 7 days.  No bending, squatting, or lifting anything over 10 pounds (4.5 kg) as directed by your caregiver.  Inspect the site at least twice daily.  Do not drive home if you are discharged the same day of the procedure. Have someone else drive you.  What to expect: Any bruising will usually fade within 1 to 2 weeks.  Blood that collects in the tissue (hematoma) may be painful to the touch. It should usually decrease in size and tenderness within 1 to 2 weeks.  SEEK IMMEDIATE MEDICAL CARE IF: You have unusual pain at the groin site or down the affected leg.  You have redness, warmth, swelling, or pain at the groin site.  You have drainage (other than a small amount of blood on the dressing).  You have chills.  You have a fever or persistent symptoms for more than 72 hours.  You have a fever and your symptoms suddenly get worse.  Your leg becomes pale, cool, tingly, or numb.  You have  heavy bleeding from the site. Hold pressure on the site.

## 2021-02-15 NOTE — Progress Notes (Signed)
CARDIAC REHAB PHASE I   PRE:  Rate/Rhythm: 71 SR    BP: sitting 118/68    SaO2: 98 RA  MODE:  Ambulation: 400 ft   POST:  Rate/Rhythm: 107 ST    BP: sitting 141/78     SaO2: 97 RA  Slow pace due to bad knee. No other c/o, sts he thinks he feels a little less exerted. Discussed with pt and wife stent, Plavix importance, restrictions, diet, exercise, and CRPII. Receptive, appropriate questions. Will refer to [place], ACSM 02/15/2021 9:31 AM

## 2021-02-15 NOTE — Progress Notes (Signed)
Progress Note  Patient Name: Roberto Ponce Date of Encounter: 02/15/2021  [facility] Cardiologist: Mertie Moores, MD    Subjective    70 yo with hx of CABG ( bilat IMA)  MVP with mod - severe MR Cath yesterday revealed normal hemodynamics.  He did reveal a significant left circumflex stenosis which was stented.  He already feels better.  In walking this morning he states that he was not nearly as fatigued.  He has been seen by cardiac rehab.  He is stable for discharge.  Inpatient Medications    Scheduled Meds:  aspirin EC  81 mg Oral Daily   brimonidine  1 drop Left Eye BID   clopidogrel  75 mg Oral Q breakfast   dorzolamide-timolol  1 drop Both Eyes BID   ezetimibe  10 mg Oral Daily   latanoprost  1 drop Both Eyes QHS   rosuvastatin  40 mg Oral QHS   sodium chloride flush  3 mL Intravenous Q12H   Continuous Infusions:  sodium chloride     PRN Meds: sodium chloride, acetaminophen, ondansetron (ZOFRAN) IV, sodium chloride flush   Vital Signs    Vitals:   02/14/21 2007 02/15/21 0032 02/15/21 0430 02/15/21 0725  BP: 125/72 116/66 106/68 125/65  Pulse: 66 71 89 68  Resp: 16 18 18 19   Temp: 98.7 F (37.1 C) 98 F (36.7 C) 98.7 F (37.1 C) 98.7 F (37.1 C)  TempSrc: Oral Oral Oral Oral  SpO2: 100% 99%  98%  Weight:   76.1 kg   Height:        Intake/Output Summary (Last 24 hours) at 02/15/2021 0901 Last data filed at 02/15/2021 0020 Gross per 24 hour  Intake 240 ml  Output 900 ml  Net -660 ml   Last 3 Weights 02/15/2021 02/14/2021 01/26/2021  Weight (lbs) 167 lb 12.8 oz 164 lb 169 lb 6.4 oz  Weight (kg) 76.114 kg 74.39 kg 76.839 kg      Telemetry    NSR  - Personally Reviewed  ECG    NSR  - Personally Reviewed  Physical Exam   GEN: No acute distress.   Neck: No JVD Cardiac: RRR,  + systolic murmur  Respiratory: Clear to auscultation bilaterally. GI: Soft, nontender, non-distended  MS: No edema; No deformity. Neuro:  Nonfocal   Psych: Normal affect   Labs    High Sensitivity Troponin:  No results for input(s): TROPONINIHS in the last 720 hours.   Chemistry Recent Labs  Lab 02/14/21 1507 02/15/21 0419  NA 139   139 132*  K 3.8   3.9 3.7  CL  --  101  CO2  --  24  GLUCOSE  --  108*  BUN  --  9  CREATININE  --  0.67  CALCIUM  --  8.3*  GFRNONAA  --  >60  ANIONGAP  --  7    Lipids No results for input(s): CHOL, TRIG, HDL, LABVLDL, LDLCALC, CHOLHDL in the last 168 hours.  Hematology Recent Labs  Lab 02/14/21 1507 02/15/21 0419  WBC  --  7.5  RBC  --  4.03*  HGB 11.9*   11.9* 12.2*  HCT 35.0*   35.0* 35.5*  MCV  --  88.1  MCH  --  30.3  MCHC  --  34.4  RDW  --  13.2  PLT  --  187   Thyroid No results for input(s): TSH, FREET4 in the last 168 hours.  BNPNo results for input(s):  BNP, PROBNP in the last 168 hours.  DDimer No results for input(s): DDIMER in the last 168 hours.   Radiology    CARDIAC CATHETERIZATION  Result Date: 02/14/2021 1.  Severe 3-vessel coronary artery disease with total occlusion of the LAD and total occlusion of the RCA, and severe stenosis of the native left circumflex 2.  Status post CABG with continued patency of the LIMA to LAD and RIMA to RCA 3.  Successful PCI of severe 95% stenosis of the mid circumflex, treated with a 3.0 x 15 mm Onyx frontier DES 4.  Severe mitral regurgitation by echo assessment, hemodynamics demonstrate low wedge pressure, no V wave, and no pulmonary hypertension. Recommendations: DAPT with aspirin and clopidogrel x6 months, repeat echocardiogram in 6 months, defer transcatheter edge-to-edge mitral valve repair at present to reevaluate after PCI.    Cardiac Studies      Patient Profile     70 y.o. male with CAD , MVP and MR   Assessment & Plan    1.  Coronary artery disease: His bilateral IMA's look great.  He did have a tight stenosis in his left circumflex artery.  Dr. Burt Knack successfully stented his left circumflex.  He will take  aspirin and Plavix for 1 year.  Continue rosuvastatin 40 mg a day  2.  Mitral regurgitation: Transesophageal echo suggested that the mitral regurgitation was moderate to severe.  He has normal hemodynamics yesterday by cath.  At this point would agree with waiting to consider MitraClip.  We will continue to assess him by echo and may need another heart catheterization at some point.  3.  Hyperlipidemia: Continue rosuvastatin and Zetia. For questions or updates, please contact [facility] Please consult [email or web address] for contact info under        Signed, Mertie Moores, MD  02/15/2021, 9:01 AM

## 2021-02-15 NOTE — Care Management (Signed)
°  Transition of Care [facility]) Screening Note   Patient Details  Name: Roberto Ponce Date of Birth: 10-28-1950   Transition of Care [facility]) CM/SW Contact:    Carles Collet, RN Phone Number: 02/15/2021, 7:53 AM    Transition of Care Department [facility]) has reviewed patient and no TOC needs have been identified at this time. We will continue to monitor patient advancement through interdisciplinary progression rounds. If new patient transition needs arise, please place a TOC consult.

## 2021-02-15 NOTE — Discharge Summary (Addendum)
Discharge Summary    Patient ID: Roberto Ponce MRN: [id number]; DOB: [birth year]  Admit date: 02/14/2021 Discharge date: 02/15/2021  PCP:  Kristen Loader, FNP   [facility] Providers Cardiologist:  Mertie Moores, MD   Discharge Diagnoses    Principal Problem:   Non-rheumatic mitral regurgitation Active Problems:   CAD (coronary artery disease) of artery bypass graft   Mixed hyperlipidemia   S/P CABG x 2  Diagnostic Studies/Procedures    Right/Left Cardiac Catheterization 02/14/2021: 1.  Severe 3-vessel coronary artery disease with total occlusion of the LAD and total occlusion of the RCA, and severe stenosis of the native left circumflex 2.  Status post CABG with continued patency of the LIMA to LAD and RIMA to RCA 3.  Successful PCI of severe 95% stenosis of the mid circumflex, treated with a 3.0 x 15 mm Onyx frontier DES 4.  Severe mitral regurgitation by echo assessment, hemodynamics demonstrate low wedge pressure, no V wave, and no pulmonary hypertension.  Recommendations: DAPT with aspirin and clopidogrel x6 months, repeat echocardiogram in 6 months, defer transcatheter edge-to-edge mitral valve repair at present to reevaluate after PCI.  Diagnostic Dominance: Right Intervention    _____________   History of Present Illness     Roberto Ponce is a 70 y.o. male with a history of CAD s/p CABG x2 (LIMA to LAD and RIMA to RCA) at the age of 63, severe mitral regurgitation, and hyperlipidemia.  Patient has a history of CAD with old inferior MI. He ultimately underwent CABG x2 with LIMA to LAD and RIMA to RCA at the age of 16. He also has a history of mild MR in the past. He reported increased dyspnea when seen by Dr. Cathie Olden in June 2022. Echo was ordered and showed LVEF of 60-65% with severe akinesis of the basal inferior, grade 1 diastolic dysfunction, and severe MR that appeared to be ischemic tething of the posterior mitral leaflet with eccentric  posteriorly directed regurgitation that hugs the atrial wall. TEE was ordered and confirmed severe MR with both anterior mitral valve prolapse and some posterior tethering that lead to a eccentric mitral regurgitation. Plan at that time was to continue to monitor closely. Repeat Echo in 12/2020 continued to show severe mitral regurgitation and he was referred to Dr. Burt Knack.  He was seen by Dr. Burt Knack on 01/26/2021 at which time he was remaining relatively active with mild symptoms of fatigue and exertion dyspnea with moderate levels of exercise. Decision was made to proceed with right/left cardiac catheterization with possible transcatheter edge-to-edge repair of the mitral valve in the future.  Hospital Course     Consultants: None  Patient presented to [facility] on 02/14/2021 for planned right/left cardiac catheterization. Cath showed severe 3 vessel CAD with CTO of LAD, CTO of RCA, and severe stenosis of the native LCX. LIMA to LAD and RIMA to RCA were patent. Hemodynamics demonstrated low wedge pressure, no V wave, and no pulmonary hypertension. He underwent successful PCI with DES to LCX lesion. Patient tolerated the procedure well. Plan is for DAPT with Aspirin 81mg  daily and Plavix 75mg  daily. Transcatheter edge-to-edge mitral valve repair will be deferred for now with plans to repeat Echo in 6 months. Will continue all other home medications.  Patient seen and examined by Dr. Acie Fredrickson today and felt to be stable for discharge. Will arrange follow-up visit with Dr. Gasper Sells. Medications as below.  Did the patient have an acute coronary syndrome (MI, NSTEMI, STEMI, etc) this admission?:  No                               Did the patient have a percutaneous coronary intervention (stent / angioplasty)?:  Yes.     Cath/PCI Registry Performance & Quality Measures: Aspirin prescribed? - Yes ADP Receptor Inhibitor (Plavix/Clopidogrel, Brilinta/Ticagrelor or Effient/Prasugrel) prescribed (includes  medically managed patients)? - Yes High Intensity Statin (Lipitor 40-80mg  or Crestor 20-40mg ) prescribed? - Yes For EF <40%, was ACEI/ARB prescribed? - Not Applicable (EF >/= 55%) For EF <40%, Aldosterone Antagonist (Spironolactone or Eplerenone) prescribed? - Not Applicable (EF >/= 97%) Cardiac Rehab Phase II ordered? - Yes         _____________  Discharge Vitals Blood pressure 125/65, pulse 68, temperature 98.7 F (37.1 C), temperature source Oral, resp. rate 19, height 5\' 11"  (1.803 m), weight 76.1 kg, SpO2 98 %.  Filed Weights   02/14/21 1230 02/15/21 0430  Weight: 74.4 kg 76.1 kg    Labs & Radiologic Studies    CBC Recent Labs    02/14/21 1507 02/15/21 0419  WBC  --  7.5  HGB 11.9*   11.9* 12.2*  HCT 35.0*   35.0* 35.5*  MCV  --  88.1  PLT  --  416   Basic Metabolic Panel Recent Labs    02/14/21 1507 02/15/21 0419  NA 139   139 132*  K 3.8   3.9 3.7  CL  --  101  CO2  --  24  GLUCOSE  --  108*  BUN  --  9  CREATININE  --  0.67  CALCIUM  --  8.3*   Liver Function Tests No results for input(s): AST, ALT, ALKPHOS, BILITOT, PROT, ALBUMIN in the last 72 hours. No results for input(s): LIPASE, AMYLASE in the last 72 hours. High Sensitivity Troponin:   No results for input(s): TROPONINIHS in the last 720 hours.  BNP Invalid input(s): POCBNP D-Dimer No results for input(s): DDIMER in the last 72 hours. Hemoglobin A1C No results for input(s): HGBA1C in the last 72 hours. Fasting Lipid Panel No results for input(s): CHOL, HDL, LDLCALC, TRIG, CHOLHDL, LDLDIRECT in the last 72 hours. Thyroid Function Tests No results for input(s): TSH, T4TOTAL, T3FREE, THYROIDAB in the last 72 hours.  Invalid input(s): FREET3 _____________  CARDIAC CATHETERIZATION  Result Date: 02/14/2021 1.  Severe 3-vessel coronary artery disease with total occlusion of the LAD and total occlusion of the RCA, and severe stenosis of the native left circumflex 2.  Status post CABG with  continued patency of the LIMA to LAD and RIMA to RCA 3.  Successful PCI of severe 95% stenosis of the mid circumflex, treated with a 3.0 x 15 mm Onyx frontier DES 4.  Severe mitral regurgitation by echo assessment, hemodynamics demonstrate low wedge pressure, no V wave, and no pulmonary hypertension. Recommendations: DAPT with aspirin and clopidogrel x6 months, repeat echocardiogram in 6 months, defer transcatheter edge-to-edge mitral valve repair at present to reevaluate after PCI.   US SCROTUM W/DOPPLER  Result Date: 02/01/2021 CLINICAL DATA:  Right testicular mass. EXAM: SCROTAL ULTRASOUND DOPPLER ULTRASOUND OF THE TESTICLES TECHNIQUE: Complete ultrasound examination of the testicles, epididymis, and other scrotal structures was performed. Color and spectral Doppler ultrasound were also utilized to evaluate blood flow to the testicles. COMPARISON:  Testicular ultrasound dated March 10, 2015. FINDINGS: Right testicle Measurements: 4.3 x 2.2 x 3.0 cm. New 4.0 x 1.7 x 1.9 cm heterogeneous mass with  prominent internal vascularity within the right testicle. Left testicle Measurements: 3.4 x 1.5 x 2.1 cm. No mass or microlithiasis visualized. Right epididymis:  Tiny epididymal cysts and microliths. Left epididymis:  Tiny epididymal cysts and microliths. Hydrocele:  Small bilateral hydroceles. Varicocele:  None visualized. Pulsed Doppler interrogation of both testes demonstrates normal low resistance arterial and venous waveforms bilaterally. IMPRESSION: 1. 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 2. Small bilateral hydroceles. These results will be called to the ordering clinician or representative by the Radiologist Assistant, and communication documented in the PACS or [employer]. Electronically Signed   By: Titus Dubin M.D.   On: 02/01/2021 14:08   Disposition   Patient is being discharged home today in good  condition.  Follow-up Plans & Appointments     Follow-up Information     Werner Lean, MD Follow up.   Specialty: Cardiology Why: Follow-up with Dr. Gasper Sells scheduled for 03/13/2021 at 8:20am. Please arrive 15 minutes early for check-in. If this date/time does not work for you, please call our office to reschedule. Contact information: [address] [place] [address] [phone]                Discharge Instructions     Amb Referral to Cardiac Rehabilitation   Complete by: As directed    Diagnosis:  Coronary Stents PTCA     After initial evaluation and assessments completed: Virtual Based Care may be provided alone or in conjunction with Phase 2 Cardiac Rehab based on patient barriers.: Yes   Diet - low sodium heart healthy   Complete by: As directed    Increase activity slowly   Complete by: As directed        Discharge Medications   Allergies as of 02/15/2021   No Known Allergies      Medication List     TAKE these medications    aspirin EC 81 MG tablet Take 1 tablet (81 mg total) by mouth daily.   brimonidine 0.2 % ophthalmic solution Commonly known as: ALPHAGAN Place 1 drop into the left eye 2 (two) times daily.   clopidogrel 75 MG tablet Commonly known as: PLAVIX Take 1 tablet (75 mg total) by mouth daily with breakfast.   dorzolamide-timolol 22.3-6.8 MG/ML ophthalmic solution Commonly known as: COSOPT Place 1 drop into both eyes 2 (two) times daily.   ezetimibe 10 MG tablet Commonly known as: ZETIA TAKE 1 TABLET(10 MG) BY MOUTH DAILY   latanoprost 0.005 % ophthalmic solution Commonly known as: XALATAN Place 1 drop into both eyes at bedtime.   nitroGLYCERIN 0.4 MG SL tablet Commonly known as: Nitrostat Place 1 tablet (0.4 mg total) under the tongue every 5 (five) minutes as needed for chest pain.   rosuvastatin 40 MG tablet Commonly known as: CRESTOR Take 40 mg by mouth at bedtime.   triamcinolone  cream 0.1 % Commonly known as: KENALOG Apply 1 application topically 2 (two) times daily as needed (eczema).           Outstanding Labs/Studies   N/A  Duration of Discharge Encounter   Greater than 30 minutes including physician time.  Signed, Darreld Mclean, PA-C 02/15/2021, 9:46 AM  Attending Note:   The patient was seen and examined.  Agree with assessment and plan as noted above.  Changes made to the above note as needed.  Patient seen and independently examined with Sande Rives, PA.  We discussed all aspects of the encounter. I agree with the assessment and plan as stated above.   1.  Coronary artery disease: His bilateral IMA's look great.  He did have a tight stenosis in his left circumflex artery.  Dr. Burt Knack successfully stented his left circumflex.  He will take aspirin and Plavix for 1 year.  Continue rosuvastatin 40 mg a day   2.  Mitral regurgitation: Transesophageal echo suggested that the mitral regurgitation was moderate to severe.  He has normal hemodynamics yesterday by cath.  At this point would agree with waiting to consider MitraClip.  We will continue to assess him by echo and may need another heart catheterization at some point.   3.  Hyperlipidemia: Continue rosuvastatin and Zetia   I have spent a total of 40 minutes with patient reviewing hospital  notes , telemetry, EKGs, labs and examining patient as well as establishing an assessment and plan that was discussed with the patient.  > 50% of time was spent in direct patient care.    Thayer Headings, Brooke Bonito., MD, [facility] 02/15/2021, 7:47 PM [address],  [place] Pager [phone]

## 2021-02-17 ENCOUNTER — Telehealth (HOSPITAL_COMMUNITY): Payer: Self-pay

## 2021-02-17 NOTE — Telephone Encounter (Signed)
Pt is not interested in the cardiac rehab program. Closed referral 

## 2021-02-21 DIAGNOSIS — C6211 Malignant neoplasm of descended right testis: Secondary | ICD-10-CM | POA: Diagnosis not present

## 2021-02-23 ENCOUNTER — Telehealth (HOSPITAL_COMMUNITY): Payer: Self-pay | Admitting: Pharmacist

## 2021-02-23 ENCOUNTER — Other Ambulatory Visit (HOSPITAL_COMMUNITY): Payer: Self-pay

## 2021-02-23 ENCOUNTER — Encounter: Payer: Self-pay | Admitting: Cardiovascular Disease

## 2021-02-23 ENCOUNTER — Telehealth: Payer: Self-pay | Admitting: Cardiovascular Disease

## 2021-02-23 NOTE — Telephone Encounter (Signed)
Pt have bruising to his Cath site since 02/14/21. Pt is sending a pic via Mychart later today. He wants to know if their is anything he should do about the bruising

## 2021-02-23 NOTE — Telephone Encounter (Signed)
° °  Pt is returning call, he said there's no knot, no swelling and just a little pain, he said he will send the photos when he gets home tonight

## 2021-02-23 NOTE — Telephone Encounter (Signed)
Called pt and left message to call back.  Left detailed message asking him to either call back or include information in his MyChart message about whether or not he has a knot, swelling or pain in the cath site.

## 2021-02-23 NOTE — Telephone Encounter (Signed)
Pharmacy Transitions of Care Follow-up Telephone Call  Date of discharge: 02/15/21  Discharge Diagnosis: Non-rheumatic mitral regurg, CAD with stent   Medication changes made at discharge:  - START:  clopidogrel (PLAVIX)  nitroGLYCERIN (Nitrostat)   - STOPPED: n/a  - CHANGED: n/a  Medication changes verified by the patient? yes    Medication Accessibility:  Home Pharmacy: [facility]   Was the patient provided with refills on discharged medications? yes   Have all prescriptions been transferred from [facility] to home pharmacy?  yes  Is the patient able to afford medications? Has ins, part D plan Notable copays: generic Eligible patient assistance: none needed    Medication Review:   CLOPIDOGREL (PLAVIX) Clopidogrel 75 mg once daily.  - Educated patient on expected duration of therapy of ASA with clopidogrel for 6 months, then reassess.  - Reviewed potential DDIs with patient  - Advised patient of medications to avoid (NSAIDs, ASA)  - Educated that Tylenol (acetaminophen) will be the preferred analgesic to prevent risk of bleeding  - Emphasized importance of monitoring for signs and symptoms of bleeding (abnormal bruising, prolonged bleeding, nose bleeds, bleeding from gums, discolored urine, black tarry stools)  - Advised patient to alert all providers of anticoagulation therapy prior to starting a new medication or having a procedure    Follow-up Appointments:   [facility] f/u appt confirmed?  Scheduled to see Dr. Gasper Sells, cardiology on 03/13/21   If their condition worsens, is the pt aware to call PCP or go to the Emergency Dept.? yes  Final Patient Assessment: Pt is doing well but reports continued bruising that appears to be expanding at the site of his recent procedure.  He will notify the cardiology office today and send a picture if needed via mychart.  Otherwise, pt is doing well, is adherent to meds, and has a good  understanding of his therapy.

## 2021-02-24 ENCOUNTER — Encounter: Payer: PPO | Admitting: Thoracic Surgery (Cardiothoracic Vascular Surgery)

## 2021-02-24 NOTE — Telephone Encounter (Signed)
Message sent to pt via [place]. Please see for follow up. Judson Roch, RN

## 2021-03-01 ENCOUNTER — Telehealth: Payer: Self-pay | Admitting: Cardiovascular Disease

## 2021-03-01 NOTE — Telephone Encounter (Signed)
I s/w the DDS office. I did mention generally we like to have the requesting office fax over clearance request to be sure that we are getting the correct and complete information needed for the cardiologist.     Pre-operative Risk Assessment    Patient Name: Roberto Ponce  DOB: 12-Jun-1950 MRN: [id number]     Request for Surgical Clearance    Procedure:  Dental Extraction - Amount of Teeth to be Pulled:  1 TOOTH ; SIMPLE EXTRACTION; TOOTH IS ABSCESSED   Date of Surgery:  Clearance TBD                                 Surgeon:  DR. Eligha Bridegroom, DDS, PA Surgeon's Group or Practice Name:   Phone number:  [phone] Fax number:  [phone]   Type of Clearance Requested:   - Medical  - Pharmacy:  Hold Aspirin and Clopidogrel (Plavix)     Type of Anesthesia:   LIDOCAINE   Additional requests/questions:    Jiles Prows   03/01/2021, 3:20 PM

## 2021-03-01 NOTE — Telephone Encounter (Signed)
Callback pool, please inform the patient he has been cleared to proceed with dental procedure. He should continue on both aspirin and plavix through the surgery. DO NOT STOP either aspirin or plavix as he recently had a new stent placed, coming off of either medication is too high of risk for stent to shut down. Overall bleeding from single teeth extraction is low even on both blood thinners.

## 2021-03-01 NOTE — Telephone Encounter (Addendum)
° °  Patient Name: Roberto Ponce  DOB: 02-18-1951 MRN: [id number]  Primary Cardiologist: Mertie Moores, MD  Chart reviewed as part of pre-operative protocol coverage.   Simple dental extractions are considered low risk procedures per guidelines and generally do not require any specific cardiac clearance. It is also generally accepted that for simple extractions and dental cleanings, there is no need to interrupt blood thinner therapy.  Continue on both aspirin and plavix through the dental procedure. He received a cardiac stent in December 2022. He is high risk for stent thrombosis if aspirin or plavix is held.  SBE prophylaxis is not required for the patient from a cardiac standpoint.  I will route this recommendation to the requesting party via Epic fax function and remove from pre-op pool.  Please call with questions.  [place], [place] 03/01/2021, 6:43 PM

## 2021-03-01 NOTE — Telephone Encounter (Signed)
Need more info from the dentist. Only 1 teeth extraction? Local anesthesia?

## 2021-03-01 NOTE — Telephone Encounter (Signed)
Patient states he just left the dentist and had and xray while there. They informed him that he will likely need to have a tooth removed and due to stents placed on 12/27. They advised him to call us for cardiac clearance. I informed him, for future reference, we will need the requesting office to contact us personally (call or fax) so that we can get all the information needing regarding the dental work. He understood and provided their contact information.  Eligha Bridegroom, DDS, [place]  Phone#: [phone] Fax#: [phone]

## 2021-03-02 NOTE — Telephone Encounter (Signed)
DPR on file: I left a detailed message the pt has been cleared for dental procedure. However left very detailed message that he is to NOT STOP the ASA or Plavix and he will remain on both of these medications for dental procedure. I will fax clearance notes and recommendations to dental office. If any question can call the dentist office or may call cardiology.

## 2021-03-06 NOTE — Progress Notes (Signed)
Cardiology Office Note:    Date:  03/13/2021   ID:  Roberto Ponce, DOB [birth year], MRN [id number]  PCP:  Kristen Loader, FNP   [facility] Providers Cardiologist:  Mertie Moores, MD     Referring MD: Kristen Loader, FNP   CC:  Follow up MR and PCI  History of Present Illness:    Roberto Ponce is a 71 y.o. male with a hx of significant eccentric mitral regurgitation, CAD with prior CABG who presents in follow up.:  Had prior eval with decrease in energy and dyspnea.  Recently seen (02/14/22) by our structural team and is s/p  PCI Lcx.  No V waves consistent with severe MR at that visit.1 Seen 03/13/21.  Patient notes that he is doing better.   Since his PCI he is back on the treadmill.  Walking for 30 minutes straight for the first time in some time (he averages 2.4 mph).  Pre-COVID-19 he could do 1.5 miles in 32 minutes.  No chest pain or pressure .  No SOB/DOE and no PND/Orthopnea.  No weight gain or leg swelling.  No palpitations or syncope.Notes some knee pain. Notes his bruising in the R groin has greatly improved and pain has largely improved.  Has a testicular mass follow up and is hoping that, similar to 2017, everything is benign.   Past Medical History:  Diagnosis Date   Coronary artery disease    Glaucoma    History of gallstones    Hyperlipidemia    LVH (left ventricular hypertrophy)    MI (myocardial infarction) ([place])    MI, old    Mild aortic sclerosis    Mitral regurgitation    Mixed dyslipidemia    Venous stasis     Past Surgical History:  Procedure Laterality Date   CARDIAC CATHETERIZATION  09/09/86   EF 56%   CARDIOVASCULAR STRESS TEST  12/16/2007   EF 54% NO ISCHEMIA   CORONARY ARTERY BYPASS GRAFT     LIMA TO THE LAD AND RIMA TO RIGHT CORONARY ARTERY   CORONARY STENT INTERVENTION N/A 02/14/2021   Procedure: CORONARY STENT INTERVENTION;  Surgeon: Sherren Mocha, MD;  Location: [place] [facility];  Service: Cardiovascular;  Laterality:  N/A;   RIGHT/LEFT HEART CATH AND CORONARY/GRAFT ANGIOGRAPHY N/A 02/14/2021   Procedure: RIGHT/LEFT HEART CATH AND CORONARY/GRAFT ANGIOGRAPHY;  Surgeon: Sherren Mocha, MD;  Location: [place] [facility];  Service: Cardiovascular;  Laterality: N/A;   TEE WITHOUT CARDIOVERSION N/A 09/02/2020   Procedure: TRANSESOPHAGEAL ECHOCARDIOGRAM (TEE);  Surgeon: Werner Lean, MD;  Location[facility] ENDOSCOPY;  Service: Cardiovascular;  Laterality: N/A;   US ECHOCARDIOGRAPHY  12/12/2007   EF 55-60%    Current Medications: Current Meds  Medication Sig   aspirin EC 81 MG tablet Take 1 tablet (81 mg total) by mouth daily.   brimonidine (ALPHAGAN) 0.2 % ophthalmic solution Place 1 drop into the left eye 2 (two) times daily.   clopidogrel (PLAVIX) 75 MG tablet Take 1 tablet (75 mg total) by mouth daily with breakfast.   dorzolamide-timolol (COSOPT) 22.3-6.8 MG/ML ophthalmic solution Place 1 drop into both eyes 2 (two) times daily.   ezetimibe (ZETIA) 10 MG tablet TAKE 1 TABLET(10 MG) BY MOUTH DAILY   latanoprost (XALATAN) 0.005 % ophthalmic solution Place 1 drop into both eyes at bedtime.    nitroGLYCERIN (NITROSTAT) 0.4 MG SL tablet Place 1 tablet (0.4 mg total) under the tongue every 5 (five) minutes as needed for chest pain.   rosuvastatin (  CRESTOR) 40 MG tablet Take 40 mg by mouth at bedtime.   triamcinolone cream (KENALOG) 0.1 % Apply 1 application topically 2 (two) times daily as needed (eczema).     Allergies:   Patient has no known allergies.   Social History   Socioeconomic History   Marital status: Married    Spouse name: Not on file   Number of children: Not on file   Years of education: Not on file   Highest education level: Not on file  Occupational History   Not on file  Tobacco Use   Smoking status: Never   Smokeless tobacco: Never  Vaping Use   Vaping Use: Never used  Substance and Sexual Activity   Alcohol use: No   Drug use: No   Sexual activity: Not on file  Other  Topics Concern   Not on file  Social History Narrative   Not on file   Social Determinants of Health   Financial Resource Strain: Not on file  Food Insecurity: Not on file  Transportation Needs: Not on file  Physical Activity: Not on file  Stress: Not on file  Social Connections: Not on file     Family History: The patient's family history includes Heart attack in his father; Stroke in his father.  ROS:   Please see the history of present illness.     All other systems reviewed and are negative.  EKGs/Labs/Other Studies Reviewed:    The following studies were reviewed today:   Transthoracic Echocardiogram: Date: 01/09/21 Results:  1. Inferior basal hypokinesis . Left ventricular ejection fraction, by  estimation, is 55%. The left ventricle has normal function. The left  ventricle demonstrates regional wall motion abnormalities (see scoring  diagram/findings for description). Left  ventricular diastolic parameters were normal.   2. Right ventricular systolic function is normal. The right ventricular  size is normal.   3. Left atrial size was moderately dilated.   4. Splay artifact severe appearing eccentric posteriorly directed MR ?  ischemic etiology with RWMA in basal inferior wall . The mitral valve is  abnormal. Severe mitral valve regurgitation. No evidence of mitral  stenosis.   5. The aortic valve is tricuspid. There is mild calcification of the  aortic valve. Aortic valve regurgitation is not visualized. Aortic valve  sclerosis is present, with no evidence of aortic valve stenosis.   6. The inferior vena cava is normal in size with greater than 50%  respiratory variability, suggesting right atrial pressure of 3 mmHg.   Transesophageal Echocardiogram: Date: 09/02/20 Results:  1. The mitral valve is abnormal- there is both anterior mitral valve  prolapse and some posterior tetthering that lead to a eccentric mitral  regurgitation; the Coanda effect is  exhibited. Severe mitral valve  regurgitation. No evidence of mitral stenosis.   The mean mitral valve gradient is 3.0 mmHg.   2. Left ventricular ejection fraction, by estimation, is 60 to 65%. The  left ventricle has normal function and size.   3. Right ventricular systolic function is normal. The right ventricular  size is normal.   4. No left atrial/left atrial appendage thrombus was detected.   5. The aortic valve is tricuspid. Aortic valve regurgitation is not  visualized. No aortic stenosis is present.    Left/Right Heart Catheterizations: Date:02/14/21 Results: 1.  Severe 3-vessel coronary artery disease with total occlusion of the LAD and total occlusion of the RCA, and severe stenosis of the native left circumflex 2.  Status post CABG with continued  patency of the LIMA to LAD and RIMA to RCA 3.  Successful PCI of severe 95% stenosis of the mid circumflex, treated with a 3.0 x 15 mm Onyx frontier DES 4.  Severe mitral regurgitation by echo assessment, hemodynamics demonstrate low wedge pressure, no V wave, and no pulmonary hypertension.  Recommendations: DAPT with aspirin and clopidogrel x6 months, repeat echocardiogram in 6 months, defer transcatheter edge-to-edge mitral valve repair at present to reevaluate after PCI.  Recent Labs: 06/16/2020: ALT 20 02/15/2021: BUN 9; Creatinine, Ser 0.67; Hemoglobin 12.2; Platelets 187; Potassium 3.7; Sodium 132  Recent Lipid Panel    Component Value Date/Time   CHOL 136 06/16/2020 0954   TRIG 82 06/16/2020 0954   HDL 34 (L) 06/16/2020 0954   CHOLHDL 4.0 06/16/2020 0954   LDLCALC 86 06/16/2020 0954    Physical Exam:    VS:  BP 122/68    Pulse 69    Ht 5\' 10"  (1.778 m)    Wt 76.2 kg    SpO2 99%    BMI 24.11 kg/m     Wt Readings from Last 3 Encounters:  03/13/21 76.2 kg  02/15/21 76.1 kg  01/26/21 76.8 kg     Gen: No distress  Neck: No JVD, Cardiac: No Rubs or Gallops, late systolic Murmur, regula rhythm +2 radial pulses No  pain or hematoma on femoral exam Respiratory: Clear to auscultation bilaterally, normal effort, normal  respiratory rate GI: Soft, nontender, non-distended  MS: trace bilateral pitting (shins)  edema;  moves all extremities Integument: Skin feels warm Neuro:  At time of evaluation, alert and oriented to person/place/time/situation  Psych: Normal affect, patient feels warm   ASSESSMENT:    1. Nonrheumatic mitral valve regurgitation   2. S/P CABG x 2   3. Coronary artery disease involving coronary bypass graft of native heart without angina pectoris    PLAN:    Significant eccentric mitral regurgitation CAD with prior CABG - Lipids at next visit, if no improvement and  given further obstructive disease despite rosuvastatin 40, Zetia 10, and LDL 68 (7/22) will send to lipid clinic - continue ASA, and plavix until June 2023 - will repeat echo in 6 months and see shortly after, based on this TEER or CMR may be considered (sub-optimal surgical candidate but anterior valve thickness is not ideal for TEER) - reviewed his TEE with patient - he is euvolemic on no lasix  Testicular mass Tooth needing pulled - planned for DAPT despite needing tooth pull (results faxed to his dentist) - if need procedural intervention for his testicular mass, we hope to defer for at least three months post PCI; thought 6 month would be ideal , we could consider stopping temporarily for procedure  Six months follow up with me or Dr. Acie Fredrickson          Medication Adjustments/Labs and Tests Ordered: Current medicines are reviewed at length with the patient today.  Concerns regarding medicines are outlined above.  Orders Placed This Encounter  Procedures   ECHOCARDIOGRAM COMPLETE   No orders of the defined types were placed in this encounter.   Patient Instructions  Medication Instructions:  Your physician recommends that you continue on your current medications as directed. Please refer to the Current  Medication list given to you today.  *If you need a refill on your cardiac medications before your next appointment, please call your pharmacy*   Lab Work: NONE If you have labs (blood work) drawn today and your tests are completely  normal, you will receive your results only by: MyChart Message (if you have MyChart) OR A paper copy in the mail If you have any lab test that is abnormal or we need to change your treatment, we will call you to review the results.   Testing/Procedures: Your physician has requested that you have an echocardiogram in  5 1/2 months. Echocardiography is a painless test that uses sound waves to create images of your heart. It provides your doctor with information about the size and shape of your heart and how well your hearts chambers and valves are working. This procedure takes approximately one hour. There are no restrictions for this procedure.    Follow-Up: At [facility], you and your health needs are our priority.  As part of our continuing mission to provide you with exceptional heart care, we have created designated Provider Care Teams.  These Care Teams include your primary Cardiologist (physician) and Advanced Practice Providers (APPs -  Physician Assistants and Nurse Practitioners) who all work together to provide you with the care you need, when you need it.   Your next appointment:   6 month(s)  The format for your next appointment:   In Person  Provider:  Rudean Haskell, MD       Signed, Werner Lean, MD  03/13/2021 8:45 AM    [place]

## 2021-03-08 DIAGNOSIS — H4089 Other specified glaucoma: Secondary | ICD-10-CM | POA: Diagnosis not present

## 2021-03-08 DIAGNOSIS — E782 Mixed hyperlipidemia: Secondary | ICD-10-CM | POA: Diagnosis not present

## 2021-03-08 DIAGNOSIS — I2510 Atherosclerotic heart disease of native coronary artery without angina pectoris: Secondary | ICD-10-CM | POA: Diagnosis not present

## 2021-03-08 DIAGNOSIS — N400 Enlarged prostate without lower urinary tract symptoms: Secondary | ICD-10-CM | POA: Diagnosis not present

## 2021-03-13 ENCOUNTER — Other Ambulatory Visit: Payer: Self-pay

## 2021-03-13 ENCOUNTER — Ambulatory Visit: Payer: PPO | Admitting: Internal Medicine

## 2021-03-13 ENCOUNTER — Encounter: Payer: Self-pay | Admitting: Internal Medicine

## 2021-03-13 VITALS — BP 122/68 | HR 69 | Ht 70.0 in | Wt 168.0 lb

## 2021-03-13 DIAGNOSIS — E782 Mixed hyperlipidemia: Secondary | ICD-10-CM

## 2021-03-13 DIAGNOSIS — Z951 Presence of aortocoronary bypass graft: Secondary | ICD-10-CM

## 2021-03-13 DIAGNOSIS — I340 Nonrheumatic mitral (valve) insufficiency: Secondary | ICD-10-CM

## 2021-03-13 DIAGNOSIS — I25810 Atherosclerosis of coronary artery bypass graft(s) without angina pectoris: Secondary | ICD-10-CM | POA: Diagnosis not present

## 2021-03-13 NOTE — Patient Instructions (Addendum)
Medication Instructions:  Your physician recommends that you continue on your current medications as directed. Please refer to the Current Medication list given to you today.  *If you need a refill on your cardiac medications before your next appointment, please call your pharmacy*   Lab Work: NONE If you have labs (blood work) drawn today and your tests are completely normal, you will receive your results only by: [place] (if you have MyChart) OR A paper copy in the mail If you have any lab test that is abnormal or we need to change your treatment, we will call you to review the results.   Testing/Procedures: Your physician has requested that you have an echocardiogram in  5 1/2 months. Echocardiography is a painless test that uses sound waves to create images of your heart. It provides your doctor with information about the size and shape of your heart and how well your hearts chambers and valves are working. This procedure takes approximately one hour. There are no restrictions for this procedure.    Follow-Up: At [facility], you and your health needs are our priority.  As part of our continuing mission to provide you with exceptional heart care, we have created designated Provider Care Teams.  These Care Teams include your primary Cardiologist (physician) and Advanced Practice Providers (APPs -  Physician Assistants and Nurse Practitioners) who all work together to provide you with the care you need, when you need it.   Your next appointment:   6 month(s)  The format for your next appointment:   In Person  Provider:  Rudean Haskell, MD

## 2021-03-14 DIAGNOSIS — R93811 Abnormal radiologic findings on diagnostic imaging of right testicle: Secondary | ICD-10-CM | POA: Diagnosis not present

## 2021-03-15 ENCOUNTER — Encounter: Payer: PPO | Admitting: Surgery

## 2021-03-16 DIAGNOSIS — C6211 Malignant neoplasm of descended right testis: Secondary | ICD-10-CM | POA: Diagnosis not present

## 2021-03-16 DIAGNOSIS — K5730 Diverticulosis of large intestine without perforation or abscess without bleeding: Secondary | ICD-10-CM | POA: Diagnosis not present

## 2021-03-16 DIAGNOSIS — C6291 Malignant neoplasm of right testis, unspecified whether descended or undescended: Secondary | ICD-10-CM | POA: Diagnosis not present

## 2021-03-16 DIAGNOSIS — J9809 Other diseases of bronchus, not elsewhere classified: Secondary | ICD-10-CM | POA: Diagnosis not present

## 2021-03-16 DIAGNOSIS — I700 Atherosclerosis of aorta: Secondary | ICD-10-CM | POA: Diagnosis not present

## 2021-04-03 ENCOUNTER — Telehealth: Payer: Self-pay | Admitting: Internal Medicine

## 2021-04-03 ENCOUNTER — Encounter: Payer: Self-pay | Admitting: Internal Medicine

## 2021-04-03 NOTE — Telephone Encounter (Signed)
Called pt please see my chart encounter from today 04/03/21.

## 2021-04-03 NOTE — Telephone Encounter (Signed)
Called pt in regards to bruising to right arm.  Pt reports first noted over a week ago.  Can't remember if hit arm on something.  Area to right arm started out dark purple and has slowly gotten lighter.  Notices a knot under skin.  Denies pain/ discomfort and drainage.  Area not hot to touch.  Does not bother pt only reporting d/t taking clopidogrel.  Advised pt that bruising is possible while taking clopidogrel.  Advised to monitor area if it does not go away or look like it's getting better in about a week to reach out to PCP.  Pt verbalizes understanding.

## 2021-04-03 NOTE — Telephone Encounter (Signed)
Patient called and realized that the number that he gave in his mychart message is for the phone he left at home. He said a better number to reach him today is [phone].

## 2021-04-10 ENCOUNTER — Other Ambulatory Visit: Payer: Self-pay | Admitting: Urology

## 2021-04-11 NOTE — Patient Instructions (Signed)
DUE TO COVID-19 ONLY ONE VISITOR IS ALLOWED TO COME WITH YOU AND STAY IN THE WAITING ROOM ONLY DURING PRE OP AND PROCEDURE.   **NO VISITORS ARE ALLOWED IN THE SHORT STAY AREA OR RECOVERY ROOM!!**  IF YOU WILL BE ADMITTED INTO THE HOSPITAL YOU ARE ALLOWED ONLY TWO SUPPORT PEOPLE DURING VISITATION HOURS ONLY (7 AM -8PM)   The support person(s) must pass our screening, gel in and out, and wear a mask at all times, including in the patients room. Patients must also wear a mask when staff or their support person are in the room. Visitors GUEST BADGE MUST BE WORN VISIBLY  One adult visitor may remain with you overnight and MUST be in the room by 8 P.M.  No visitors under the age of 36. Any visitor under the age of 29 must be accompanied by an adult.        Your procedure is scheduled on: 04/19/21   Report to [facility] Main Entrance    Report to admitting at: 8:45 AM   Call this number if you have problems the morning of surgery [phone]   Do not eat food :After Midnight.   May have liquids until : 8:00 AM   day of surgery  CLEAR LIQUID DIET  Foods Allowed                                                                     Foods Excluded  Water, Black Coffee and tea, regular and decaf                             liquids that you cannot  Plain Jell-O in any flavor  (No red)                                           see through such as: Fruit ices (not with fruit pulp)                                     milk, soups, orange juice              Iced Popsicles (No red)                                    All solid food, NO COFFEE CREAMERS                                 Apple juices Sports drinks like Gatorade (No red) Lightly seasoned clear broth or consume(fat free) Sugar Sample Menu Breakfast                                Lunch  Supper Apple juice                    Beef broth                            Chicken broth Jell-O                                      Apple juice                           Apple juice Coffee or tea                        Jell-O                                      Popsicle                                                Coffee or tea                        Coffee or tea  FOLLOW BOWEL PREP AND ANY ADDITIONAL PRE OP INSTRUCTIONS YOU RECEIVED FROM YOUR SURGEON'S OFFICE!!!   Oral Hygiene is also important to reduce your risk of infection.                                    Remember - BRUSH YOUR TEETH THE MORNING OF SURGERY WITH YOUR REGULAR TOOTHPASTE   Do NOT smoke after Midnight   Take these medicines the morning of surgery with A SIP OF WATER: N/A. Use eye drops as usual.  DO NOT TAKE ANY ORAL DIABETIC MEDICATIONS DAY OF YOUR SURGERY                              You may not have any metal on your body including hair pins, jewelry, and body piercing             Do not wear lotions, powders, perfumes/cologne, or deodorant              Men may shave face and neck.   Do not bring valuables to the hospital. [place].   Contacts, dentures or bridgework may not be worn into surgery.   Bring small overnight bag day of surgery.    Patients discharged on the day of surgery will not be allowed to drive home.  Someone needs to stay with you for the first 24 hours after anesthesia.   Special Instructions: Bring a copy of your healthcare power of attorney and living will documents         the day of surgery if you haven't scanned them before.              Please read over the following fact sheets you  were given: IF YOU HAVE QUESTIONS ABOUT YOUR PRE-OP INSTRUCTIONS PLEASE CALL [phone]     [facility] - Preparing for Surgery Before surgery, you can play an important role.  Because skin is not sterile, your skin needs to be as free of germs as possible.  You can reduce the number of germs on your skin by washing with CHG (chlorahexidine gluconate) soap  before surgery.  CHG is an antiseptic cleaner which kills germs and bonds with the skin to continue killing germs even after washing. Please DO NOT use if you have an allergy to CHG or antibacterial soaps.  If your skin becomes reddened/irritated stop using the CHG and inform your nurse when you arrive at Short Stay. Do not shave (including legs and underarms) for at least 48 hours prior to the first CHG shower.  You may shave your face/neck. Please follow these instructions carefully:  1.  Shower with CHG Soap the night before surgery and the  morning of Surgery.  2.  If you choose to wash your hair, wash your hair first as usual with your  normal  shampoo.  3.  After you shampoo, rinse your hair and body thoroughly to remove the  shampoo.                           4.  Use CHG as you would any other liquid soap.  You can apply chg directly  to the skin and wash                       Gently with a scrungie or clean washcloth.  5.  Apply the CHG Soap to your body ONLY FROM THE NECK DOWN.   Do not use on face/ open                           Wound or open sores. Avoid contact with eyes, ears mouth and genitals (private parts).                       Wash face,  Genitals (private parts) with your normal soap.             6.  Wash thoroughly, paying special attention to the area where your surgery  will be performed.  7.  Thoroughly rinse your body with warm water from the neck down.  8.  DO NOT shower/wash with your normal soap after using and rinsing off  the CHG Soap.                9.  Pat yourself dry with a clean towel.            10.  Wear clean pajamas.            11.  Place clean sheets on your bed the night of your first shower and do not  sleep with pets. Day of Surgery : Do not apply any lotions/deodorants the morning of surgery.  Please wear clean clothes to the hospital/surgery center.  FAILURE TO FOLLOW THESE INSTRUCTIONS MAY RESULT IN THE CANCELLATION OF YOUR SURGERY PATIENT  SIGNATURE_________________________________  NURSE SIGNATURE__________________________________  ________________________________________________________________________

## 2021-04-12 DIAGNOSIS — H401133 Primary open-angle glaucoma, bilateral, severe stage: Secondary | ICD-10-CM | POA: Diagnosis not present

## 2021-04-12 DIAGNOSIS — H401132 Primary open-angle glaucoma, bilateral, moderate stage: Secondary | ICD-10-CM | POA: Diagnosis not present

## 2021-04-13 ENCOUNTER — Encounter (HOSPITAL_COMMUNITY)
Admission: RE | Admit: 2021-04-13 | Discharge: 2021-04-13 | Disposition: A | Discharge status: Home / Self Care | Payer: PPO | Source: Ambulatory Visit | Attending: Urology | Admitting: Urology

## 2021-04-13 ENCOUNTER — Encounter (HOSPITAL_COMMUNITY): Payer: Self-pay

## 2021-04-13 ENCOUNTER — Other Ambulatory Visit: Payer: Self-pay

## 2021-04-13 VITALS — BP 122/74 | HR 65 | Temp 98.5°F | Resp 16 | Ht 69.0 in | Wt 165.0 lb

## 2021-04-13 DIAGNOSIS — Z951 Presence of aortocoronary bypass graft: Secondary | ICD-10-CM | POA: Diagnosis not present

## 2021-04-13 DIAGNOSIS — N5089 Other specified disorders of the male genital organs: Secondary | ICD-10-CM | POA: Diagnosis not present

## 2021-04-13 DIAGNOSIS — J45909 Unspecified asthma, uncomplicated: Secondary | ICD-10-CM | POA: Insufficient documentation

## 2021-04-13 DIAGNOSIS — I25810 Atherosclerosis of coronary artery bypass graft(s) without angina pectoris: Secondary | ICD-10-CM | POA: Insufficient documentation

## 2021-04-13 DIAGNOSIS — Z01812 Encounter for preprocedural laboratory examination: Secondary | ICD-10-CM | POA: Diagnosis not present

## 2021-04-13 HISTORY — DX: Unspecified asthma, uncomplicated: J45.909

## 2021-04-13 LAB — CBC
HCT: 41.4 % (ref 39.0–52.0)
Hemoglobin: 14.2 g/dL (ref 13.0–17.0)
MCH: 30.8 pg (ref 26.0–34.0)
MCHC: 34.3 g/dL (ref 30.0–36.0)
MCV: 89.8 fL (ref 80.0–100.0)
Platelets: 178 10*3/uL (ref 150–400)
RBC: 4.61 MIL/uL (ref 4.22–5.81)
RDW: 13.4 % (ref 11.5–15.5)
WBC: 6.6 10*3/uL (ref 4.0–10.5)
nRBC: 0 % (ref 0.0–0.2)

## 2021-04-13 LAB — BASIC METABOLIC PANEL
Anion gap: 6 (ref 5–15)
BUN: 11 mg/dL (ref 8–23)
CO2: 28 mmol/L (ref 22–32)
Calcium: 9.1 mg/dL (ref 8.9–10.3)
Chloride: 100 mmol/L (ref 98–111)
Creatinine, Ser: 0.85 mg/dL (ref 0.61–1.24)
GFR, Estimated: >60 mL/min (ref >60)
Glucose, Bld: 100 mg/dL — ABNORMAL HIGH (ref 70–99)
Potassium: 4.5 mmol/L (ref 3.5–5.1)
Sodium: 134 mmol/L — ABNORMAL LOW (ref 135–145)

## 2021-04-13 NOTE — Progress Notes (Signed)
COVID Vaccine: 05/15/20 x 3 COVID SWAB appointment date: N/A Date of COVID positive in last 90 days: N/A  Bowel Prep reminder:N/A  For Anesthesia: PCP - Jillyn Ledger: FNP. Cardiologist - Dr. Minna Antis. LOV: 03/13/21  Chest x-ray -  EKG - 02/15/21 Stress Test -  ECHO - 03/13/21 Cardiac Cath - 02/14/21 Pacemaker/ICD device last checked: Pacemaker orders received: Device Rep notified:  Spinal Cord Stimulator:  Sleep Study -  CPAP -   Fasting Blood Sugar -  Checks Blood Sugar _____ times a day Date and result of last Hgb A1c-  Blood Thinner Instructions: Plavix on hold since 04/13/21. Continue aspirin,as per pharmacy. Aspirin Instructions: Last Dose:  Activity level: Can go up a flight of stairs and activities of daily living without stopping and without chest pain and/or shortness of breath   Able to exercise without chest pain and/or shortness of breath   Unable to go up a flight of stairs without chest pain and/or shortness of breath     Anesthesia review: Hx: CAD,MI,CABG  Patient denies shortness of breath, fever, cough and chest pain at PAT appointment   Patient verbalized understanding of instructions that were given to them at the PAT appointment. Patient was also instructed that they will need to review over the PAT instructions again at home before surgery.

## 2021-04-14 NOTE — Anesthesia Preprocedure Evaluation (Addendum)
Anesthesia Evaluation  Patient identified by MRN, date of birth, ID band Patient awake    Reviewed: Allergy & Precautions, NPO status , Patient's Chart, lab work & pertinent test results  Airway Mallampati: II  TM Distance: >3 FB Neck ROM: Full    Dental  (+) Partial Upper, Partial Lower   Pulmonary asthma ,    Pulmonary exam normal        Cardiovascular + CAD, + Past MI, + Cardiac Stents (12/22) and + CABG   Rhythm:Regular Rate:Normal + Diastolic murmurs    Neuro/Psych negative neurological ROS  negative psych ROS   GI/Hepatic negative GI ROS, Neg liver ROS,   Endo/Other  negative endocrine ROS  Renal/GU negative Renal ROS   Right testicular mass    Musculoskeletal negative musculoskeletal ROS (+)   Abdominal Normal abdominal exam  (+)   Peds  Hematology negative hematology ROS (+) Lab Results      Component                Value               Date                      WBC                      6.6                 04/13/2021                HGB                      14.2                04/13/2021                HCT                      41.4                04/13/2021                MCV                      89.8                04/13/2021                PLT                      178                 04/13/2021           Lab Results      Component                Value               Date                      NA                       134 (L)             04/13/2021                K  4.5                 04/13/2021                CO2                      28                  04/13/2021                GLUCOSE                  100 (H)             04/13/2021                BUN                      11                  04/13/2021                CREATININE               0.85                04/13/2021                CALCIUM                  9.1                 04/13/2021                EGFR                      97                  01/26/2021                GFRNONAA                 >60                 04/13/2021             Anesthesia Other Findings   Reproductive/Obstetrics                            Anesthesia Physical Anesthesia Plan  ASA: 3  Anesthesia Plan: General   Post-op Pain Management:    Induction: Intravenous  PONV Risk Score and Plan: 2 and Ondansetron, Dexamethasone and Treatment may vary due to age or medical condition  Airway Management Planned: Mask and LMA  Additional Equipment: None  Intra-op Plan:   Post-operative Plan: Extubation in OR  Informed Consent: I have reviewed the patients History and Physical, chart, labs and discussed the procedure including the risks, benefits and alternatives for the proposed anesthesia with the patient or authorized representative who has indicated his/her understanding and acceptance.     Dental advisory given  Plan Discussed with: CRNA  Anesthesia Plan Comments: (See PAT note 04/13/21, per note:  Pt last seen by cardiology 03/13/2021. S/p PCI 02/14/2022. Per OV note, "if need procedural intervention for his testicular mass, we hope to defer for at least three months post PCI; though 6 month would be ideal , we could consider stopping temporarily for procedure" Addendum 04/14/2021:  Per Dr. Purvis Sheffield office Dr. Gloriann Loan discussed with cardiologist, Dr. Gasper Sells who gave the ok to hold Plavix for this procedure.  ECHO 11/22: 1. Inferior basal hypokinesis . Left ventricular ejection fraction, by  estimation, is 55%. The left ventricle has normal function. The left  ventricle demonstrates regional wall motion abnormalities (see scoring  diagram/findings for description). Left  ventricular diastolic parameters were normal.  2. Right ventricular systolic function is normal. The right ventricular  size is normal.  3. Left atrial size was moderately dilated.  4. Splay artifact severe appearing eccentric  posteriorly directed MR ?  ischemic etiology with RWMA in basal inferior wall . The mitral valve is  abnormal. Severe mitral valve regurgitation. No evidence of mitral  stenosis.  5. The aortic valve is tricuspid. There is mild calcification of the  aortic valve. Aortic valve regurgitation is not visualized. Aortic valve  sclerosis is present, with no evidence of aortic valve stenosis.  6. The inferior vena cava is normal in size with greater than 50%  respiratory variability, suggesting right atrial pressure of 3 mmHg.)      Anesthesia Quick Evaluation

## 2021-04-14 NOTE — Progress Notes (Addendum)
Anesthesia Chart Review   Case: [id number] Date/Time: 04/19/21 1045   Procedure: RIGHT RADICAL INGUINAL ORCHIECTOMY (Right) - 1 HR FOR CASE   Anesthesia type: General   Pre-op diagnosis: RIGHT TESTICLE MASS   Location: [place] / [facility]   Surgeons: Lucas Mallow, MD       DISCUSSION:71 y.o. never smoker with h/o asthma, CAD (CABG, PCI 12/27/2), right testicle mass scheduled for above procedure 04/19/21 with Dr. Link Snuffer.   Pt last seen by cardiology 03/13/2021. S/p PCI 02/14/2022. Per OV note, "if need procedural intervention for his testicular mass, we hope to defer for at least three months post PCI; though 6 month would be ideal , we could consider stopping temporarily for procedure"  Discussed with Dr. Purvis Sheffield office.   Addendum 04/14/2021:  Per Dr. Purvis Sheffield office Dr. Gloriann Loan discussed with cardiologist, Dr. Gasper Sells who gave the ok to hold Plavix for this procedure.  VS: BP 122/74    Pulse 65    Temp 36.9 C (Oral)    Resp 16    Ht 5\' 9"  (1.753 m)    Wt 74.8 kg    SpO2 100%    BMI 24.37 kg/m   PROVIDERS: Kristen Loader, FNP is PCP   Cardiologist:  Mertie Moores, MD     LABS: Labs reviewed: Acceptable for surgery. (all labs ordered are listed, but only abnormal results are displayed)  Labs Reviewed  BASIC METABOLIC PANEL - Abnormal; Notable for the following components:      Result Value   Sodium 134 (*)    Glucose, Bld 100 (*)    All other components within normal limits  CBC     IMAGES:   EKG: 02/15/2021 Rate 68 bpm NSR Possible inferior infarct, age undetermined  CV: Echo 01/09/21 1. Inferior basal hypokinesis . Left ventricular ejection fraction, by  estimation, is 55%. The left ventricle has normal function. The left  ventricle demonstrates regional wall motion abnormalities (see scoring  diagram/findings for description). Left  ventricular diastolic parameters were normal.   2. Right ventricular systolic function is normal. The right  ventricular  size is normal.   3. Left atrial size was moderately dilated.   4. Splay artifact severe appearing eccentric posteriorly directed MR ?  ischemic etiology with RWMA in basal inferior wall . The mitral valve is  abnormal. Severe mitral valve regurgitation. No evidence of mitral  stenosis.   5. The aortic valve is tricuspid. There is mild calcification of the  aortic valve. Aortic valve regurgitation is not visualized. Aortic valve  sclerosis is present, with no evidence of aortic valve stenosis.   6. The inferior vena cava is normal in size with greater than 50%  respiratory variability, suggesting right atrial pressure of 3 mmHg. Past Medical History:  Diagnosis Date   Asthma    Coronary artery disease    Glaucoma    History of gallstones    Hyperlipidemia    LVH (left ventricular hypertrophy)    MI (myocardial infarction) ([place])    MI, old    Mild aortic sclerosis    Mitral regurgitation    Mixed dyslipidemia    Venous stasis     Past Surgical History:  Procedure Laterality Date   CARDIAC CATHETERIZATION  09/09/1986   EF 56%   CARDIOVASCULAR STRESS TEST  12/16/2007   EF 54% NO ISCHEMIA   CHOLECYSTECTOMY     CORONARY ARTERY BYPASS GRAFT     LIMA TO THE LAD AND RIMA  TO RIGHT CORONARY ARTERY   CORONARY STENT INTERVENTION N/A 02/14/2021   Procedure: CORONARY STENT INTERVENTION;  Surgeon: Sherren Mocha, MD;  Location: [facility] [facility];  Service: Cardiovascular;  Laterality: N/A;   RIGHT/LEFT HEART CATH AND CORONARY/GRAFT ANGIOGRAPHY N/A 02/14/2021   Procedure: RIGHT/LEFT HEART CATH AND CORONARY/GRAFT ANGIOGRAPHY;  Surgeon: Sherren Mocha, MD;  Location: [place] [facility];  Service: Cardiovascular;  Laterality: N/A;   TEE WITHOUT CARDIOVERSION N/A 09/02/2020   Procedure: TRANSESOPHAGEAL ECHOCARDIOGRAM (TEE);  Surgeon: Werner Lean, MD;  Location: [facility] END[facility];  Service: Cardiovascular;  Laterality: N/A;   US ECHOCARDIOGRAPHY  12/12/2007   EF 55-60%    VASECTOMY  1988    MEDICATIONS:  acetaminophen (TYLENOL) 500 MG tablet   aspirin EC 81 MG tablet   brimonidine (ALPHAGAN) 0.2 % ophthalmic solution   clopidogrel (PLAVIX) 75 MG tablet   dorzolamide-timolol (COSOPT) 22.3-6.8 MG/ML ophthalmic solution   ezetimibe (ZETIA) 10 MG tablet   latanoprost (XALATAN) 0.005 % ophthalmic solution   nitroGLYCERIN (NITROSTAT) 0.4 MG SL tablet   rosuvastatin (CRESTOR) 40 MG tablet   triamcinolone cream (KENALOG) 0.1 %   No current facility-administered medications for this encounter.   Roberto Felix Ward, PA-C [facility] [phone]

## 2021-04-19 ENCOUNTER — Encounter (HOSPITAL_COMMUNITY): Payer: Self-pay | Admitting: Urology

## 2021-04-19 ENCOUNTER — Encounter (HOSPITAL_COMMUNITY)
Admission: RE | Disposition: A | Discharge status: Home / Self Care | Payer: Self-pay | Source: Home / Self Care | Attending: Urology

## 2021-04-19 ENCOUNTER — Ambulatory Visit (HOSPITAL_COMMUNITY): Payer: PPO | Admitting: Physician Assistant

## 2021-04-19 ENCOUNTER — Other Ambulatory Visit: Payer: Self-pay

## 2021-04-19 ENCOUNTER — Ambulatory Visit (HOSPITAL_COMMUNITY)
Admission: RE | Admit: 2021-04-19 | Discharge: 2021-04-19 | Disposition: A | Discharge status: Home / Self Care | Payer: PPO | Attending: Urology | Admitting: Urology

## 2021-04-19 ENCOUNTER — Ambulatory Visit (HOSPITAL_BASED_OUTPATIENT_CLINIC_OR_DEPARTMENT_OTHER): Payer: PPO | Admitting: Certified Registered"

## 2021-04-19 DIAGNOSIS — I2510 Atherosclerotic heart disease of native coronary artery without angina pectoris: Secondary | ICD-10-CM | POA: Insufficient documentation

## 2021-04-19 DIAGNOSIS — C6291 Malignant neoplasm of right testis, unspecified whether descended or undescended: Secondary | ICD-10-CM | POA: Insufficient documentation

## 2021-04-19 DIAGNOSIS — Z955 Presence of coronary angioplasty implant and graft: Secondary | ICD-10-CM | POA: Insufficient documentation

## 2021-04-19 DIAGNOSIS — I340 Nonrheumatic mitral (valve) insufficiency: Secondary | ICD-10-CM | POA: Insufficient documentation

## 2021-04-19 DIAGNOSIS — Z951 Presence of aortocoronary bypass graft: Secondary | ICD-10-CM | POA: Insufficient documentation

## 2021-04-19 DIAGNOSIS — N5089 Other specified disorders of the male genital organs: Secondary | ICD-10-CM

## 2021-04-19 DIAGNOSIS — I252 Old myocardial infarction: Secondary | ICD-10-CM | POA: Diagnosis not present

## 2021-04-19 HISTORY — PX: ORCHIECTOMY: SHX2116

## 2021-04-19 SURGERY — ORCHIECTOMY
Anesthesia: General | Laterality: Right

## 2021-04-19 MED ORDER — MIDAZOLAM HCL 2 MG/2ML IJ SOLN
INTRAMUSCULAR | Status: AC
  Filled 2021-04-19: qty 2

## 2021-04-19 MED ORDER — EPHEDRINE SULFATE-NACL 50-0.9 MG/10ML-% IV SOSY
PREFILLED_SYRINGE | INTRAVENOUS | Status: DC | PRN
  Administered 2021-04-19: 15 mg via INTRAVENOUS
  Administered 2021-04-19: 5 mg via INTRAVENOUS

## 2021-04-19 MED ORDER — ORAL CARE MOUTH RINSE: 15.0000 mL | Freq: Once | OROMUCOSAL | Status: AC

## 2021-04-19 MED ORDER — DEXAMETHASONE SODIUM PHOSPHATE 10 MG/ML IJ SOLN
INTRAMUSCULAR | Status: DC | PRN
  Administered 2021-04-19: 4 mg via INTRAVENOUS

## 2021-04-19 MED ORDER — BUPIVACAINE HCL (PF) 0.25 % IJ SOLN
INTRAMUSCULAR | Status: DC | PRN
Start: 2021-04-19 — End: 2021-04-19
  Administered 2021-04-19: 20 mL

## 2021-04-19 MED ORDER — ONDANSETRON HCL 4 MG/2ML IJ SOLN
INTRAMUSCULAR | Status: AC
  Filled 2021-04-19: qty 2

## 2021-04-19 MED ORDER — ROCURONIUM BROMIDE 10 MG/ML (PF) SYRINGE
PREFILLED_SYRINGE | INTRAVENOUS | Status: AC
  Filled 2021-04-19: qty 10

## 2021-04-19 MED ORDER — SUGAMMADEX SODIUM 200 MG/2ML IV SOLN
INTRAVENOUS | Status: DC | PRN
  Administered 2021-04-19: 150 mg via INTRAVENOUS

## 2021-04-19 MED ORDER — ONDANSETRON HCL 4 MG/2ML IJ SOLN
INTRAMUSCULAR | Status: DC | PRN
  Administered 2021-04-19: 4 mg via INTRAVENOUS

## 2021-04-19 MED ORDER — LIDOCAINE 2% (20 MG/ML) 5 ML SYRINGE
INTRAMUSCULAR | Status: DC | PRN
  Administered 2021-04-19: 60 mg via INTRAVENOUS

## 2021-04-19 MED ORDER — OXYCODONE HCL 5 MG PO CAPS
5.0000 mg | ORAL_CAPSULE | ORAL | 0 refills | Status: DC | PRN
Start: 2021-04-19 — End: 2021-07-05

## 2021-04-19 MED ORDER — LIDOCAINE HCL (PF) 2 % IJ SOLN
INTRAMUSCULAR | Status: AC
  Filled 2021-04-19: qty 5

## 2021-04-19 MED ORDER — FENTANYL CITRATE (PF) 100 MCG/2ML IJ SOLN
INTRAMUSCULAR | Status: DC | PRN
  Administered 2021-04-19: 100 ug via INTRAVENOUS

## 2021-04-19 MED ORDER — MIDAZOLAM HCL 2 MG/2ML IJ SOLN
INTRAMUSCULAR | Status: DC | PRN
  Administered 2021-04-19: 2 mg via INTRAVENOUS

## 2021-04-19 MED ORDER — CEFAZOLIN SODIUM-DEXTROSE 2-4 GM/100ML-% IV SOLN
2.0000 g | INTRAVENOUS | Status: AC
  Administered 2021-04-19: 2 g via INTRAVENOUS
  Filled 2021-04-19: qty 100

## 2021-04-19 MED ORDER — PROPOFOL 10 MG/ML IV BOLUS
INTRAVENOUS | Status: AC
  Filled 2021-04-19: qty 20

## 2021-04-19 MED ORDER — LACTATED RINGERS IV SOLN: INTRAVENOUS | Status: DC

## 2021-04-19 MED ORDER — ROCURONIUM BROMIDE 10 MG/ML (PF) SYRINGE
PREFILLED_SYRINGE | INTRAVENOUS | Status: DC | PRN
Start: 2021-04-19 — End: 2021-04-19
  Administered 2021-04-19: 60 mg via INTRAVENOUS

## 2021-04-19 MED ORDER — CHLORHEXIDINE GLUCONATE 0.12 % MT SOLN
15.0000 mL | Freq: Once | OROMUCOSAL | Status: AC
Start: 2021-04-19 — End: 2021-04-19
  Administered 2021-04-19: 15 mL via OROMUCOSAL

## 2021-04-19 MED ORDER — 0.9 % SODIUM CHLORIDE (POUR BTL) OPTIME
TOPICAL | Status: DC | PRN
  Administered 2021-04-19: 1000 mL

## 2021-04-19 MED ORDER — FENTANYL CITRATE (PF) 100 MCG/2ML IJ SOLN
INTRAMUSCULAR | Status: AC
  Filled 2021-04-19: qty 2

## 2021-04-19 MED ORDER — PROPOFOL 10 MG/ML IV BOLUS
INTRAVENOUS | Status: DC | PRN
  Administered 2021-04-19: 120 mg via INTRAVENOUS

## 2021-04-19 MED ORDER — DEXAMETHASONE SODIUM PHOSPHATE 10 MG/ML IJ SOLN
INTRAMUSCULAR | Status: AC
  Filled 2021-04-19: qty 1

## 2021-04-19 MED ORDER — BUPIVACAINE HCL 0.25 % IJ SOLN
INTRAMUSCULAR | Status: AC
  Filled 2021-04-19: qty 1

## 2021-04-19 SURGICAL SUPPLY — 31 items
BAG COUNTER SPONGE SURGICOUNT (BAG) IMPLANT
BLADE CLIPPER SURG (BLADE) ×2 IMPLANT
BLADE HEX COATED 2.75 (ELECTRODE) ×1 IMPLANT
BNDG GAUZE ELAST 4 BULKY (GAUZE/BANDAGES/DRESSINGS) ×2 IMPLANT
COVER SURGICAL LIGHT HANDLE (MISCELLANEOUS) ×2 IMPLANT
DERMABOND ADVANCED (GAUZE/BANDAGES/DRESSINGS) ×1
DERMABOND ADVANCED .7 DNX12 (GAUZE/BANDAGES/DRESSINGS) ×1 IMPLANT
DRAIN PENROSE 0.25X18 (DRAIN) ×1 IMPLANT
DRAPE LAPAROTOMY T 98X78 PEDS (DRAPES) ×2 IMPLANT
ELECT REM PT RETURN 15FT ADLT (MISCELLANEOUS) ×2 IMPLANT
GLOVE SURG ENC MOIS LTX SZ7.5 (GLOVE) ×4 IMPLANT
GOWN STRL REUS W/TWL LRG LVL3 (GOWN DISPOSABLE) ×2 IMPLANT
KIT BASIN OR (CUSTOM PROCEDURE TRAY) ×2 IMPLANT
KIT TURNOVER KIT A (KITS) IMPLANT
NEEDLE HYPO 22GX1.5 SAFETY (NEEDLE) IMPLANT
NS IRRIG 1000ML POUR BTL (IV SOLUTION) ×2 IMPLANT
PACK GENERAL/GYN (CUSTOM PROCEDURE TRAY) ×2 IMPLANT
SOL PREP POV-IOD 4OZ 10% (MISCELLANEOUS) ×2 IMPLANT
SUPPORT SCROTAL LG STRP (MISCELLANEOUS) ×2 IMPLANT
SUT CHROMIC 3 0 SH 27 (SUTURE) ×4 IMPLANT
SUT MNCRL AB 4-0 PS2 18 (SUTURE) ×1 IMPLANT
SUT PDS AB 4-0 SH 27 (SUTURE) ×2 IMPLANT
SUT SILK 2 0 (SUTURE) ×2
SUT SILK 2 0 SH (SUTURE) ×1 IMPLANT
SUT SILK 2-0 18XBRD TIE 12 (SUTURE) IMPLANT
SUT VIC AB 0 UR5 27 (SUTURE) ×2 IMPLANT
SUT VIC AB 2-0 UR5 27 (SUTURE) IMPLANT
SUT VICRYL 0 TIES 12 18 (SUTURE) IMPLANT
SYR CONTROL 10ML LL (SYRINGE) IMPLANT
TOWEL OR 17X26 10 PK STRL BLUE (TOWEL DISPOSABLE) ×2 IMPLANT
WATER STERILE IRR 1000ML POUR (IV SOLUTION) ×2 IMPLANT

## 2021-04-19 NOTE — Anesthesia Postprocedure Evaluation (Signed)
Anesthesia Post Note ? ?Patient: Roberto Ponce ? ?Procedure(s) Performed: RIGHT RADICAL INGUINAL ORCHIECTOMY (Right) ? ?  ? ?Patient location during evaluation: PACU ?Anesthesia Type: General ?Level of consciousness: awake and alert ?Pain management: pain level controlled ?Vital Signs Assessment: post-procedure vital signs reviewed and stable ?Respiratory status: spontaneous breathing, nonlabored ventilation, respiratory function stable and patient connected to nasal cannula oxygen ?Cardiovascular status: blood pressure returned to baseline and stable ?Postop Assessment: no apparent nausea or vomiting ?Anesthetic complications: no ? ? ?No notable events documented. ? ?Last Vitals:  ?Vitals:  ? 04/19/21 1315 04/19/21 1330  ?BP: 118/77 108/69  ?Pulse: 67 62  ?Resp: 18 15  ?Temp:    ?SpO2: 98% 99%  ?  ?Last Pain:  ?Vitals:  ? 04/19/21 1330  ?TempSrc:   ?PainSc: 0-No pain  ? ? ?  ?  ?  ?  ?  ?  ? ?March Rummage Tim Corriher ? ? ? ? ?

## 2021-04-19 NOTE — Transfer of Care (Signed)
Immediate Anesthesia Transfer of Care Note ? ?Patient: Roberto Ponce ? ?Procedure(s) Performed: RIGHT RADICAL INGUINAL ORCHIECTOMY (Right) ? ?Patient Location: PACU ? ?Anesthesia Type:General ? ?Level of Consciousness: drowsy ? ?Airway & Oxygen Therapy: Patient Spontanous Breathing and Patient connected to face mask oxygen ? ?Post-op Assessment: Report given to RN, Post -op Vital signs reviewed and stable and Patient moving all extremities X 4 ? ?Post vital signs: Reviewed and stable ? ?Last Vitals:  ?Vitals Value Taken Time  ?BP 124/66   ?Temp    ?Pulse 67 04/19/21 1248  ?Resp 20 04/19/21 1248  ?SpO2 100 % 04/19/21 1248  ?Vitals shown include unvalidated device data. ? ?Last Pain:  ?Vitals:  ? 04/19/21 0932  ?TempSrc: Oral  ?   ? ?  ? ?Complications: No notable events documented. ?

## 2021-04-19 NOTE — H&P (Signed)
CC/HPI: CC: Right testicular mass  ?HPI:  ?71 year old male with a history of significant mitral regurgitation and coronary artery disease with prior CABG who recently underwent cardiac catheterization with stenting on 02/14/2021. He is now on aspirin and Plavix. He was noted to have a right-sided testicular mass and underwent a scrotal ultrasound on 02/01/2021 that revealed a 4 cm heterogeneous right-sided solid testicular mass concerning for testicular neoplasm. Left testicle without any mass.  ? ?  ?ALLERGIES: None  ? ?MEDICATIONS: Aspirin Ec 81 mg tablet, delayed release  ?Brimonidine Tartrate 0.2 % drops  ?Clopidogrel 75 mg tablet  ?Dorzolamide 2 % drops  ?Ezetimibe 10 mg tablet  ?Latanoprost 0.005 % drops  ?Nitroglycerin 0.4 mg tablet, sublingual  ?Rosuvastatin Calcium 40 mg tablet  ?Triamcinolone Acetonide 0.1 % cream  ?  ? ?GU PSH: None  ?   ?PSH Notes: Cyst removal on left wrist  ? ?NON-GU PSH: CABG (coronary artery bypass grafting) ?Cardiac Stent Placement ?Cataract surgery ?Cholecystectomy (laparoscopic) ?Tonsillectomy ? ?  ? ?GU PMH: None  ? ?NON-GU PMH: Asthma ?Coronary Artery Disease ?Glaucoma ?Hypercholesterolemia ?Myocardial Infarction ?  ? ?FAMILY HISTORY: Heart Disease - Father  ? ?SOCIAL HISTORY: Marital Status: Married ?Ethnicity: Not Hispanic Or Latino; Race: White ?Current Smoking Status: Patient has never smoked.  ? ?Tobacco Use Assessment Completed: Used Tobacco in last 30 days? ?Has never drank.  ?Drinks 1 caffeinated drink per day. ?  ? ?REVIEW OF SYSTEMS:    ?GU Review Male:   Patient reports frequent urination, hard to postpone urination, get up at night to urinate, and erection problems. Patient denies burning/ pain with urination, leakage of urine, stream starts and stops, trouble starting your stream, have to strain to urinate , and penile pain.  ?Gastrointestinal (Upper):   Patient denies nausea, vomiting, and indigestion/ heartburn.  ?Gastrointestinal (Lower):   Patient denies  diarrhea and constipation.  ?Constitutional:   Patient reports fatigue. Patient denies fever, night sweats, and weight loss.  ?Skin:   Patient reports skin rash/ lesion and itching.   ?Eyes:   Patient denies blurred vision and double vision.  ?Ears/ Nose/ Throat:   Patient denies sore throat and sinus problems.  ?Hematologic/Lymphatic:   Patient denies swollen glands and easy bruising.  ?Cardiovascular:   Patient denies leg swelling and chest pains.  ?Respiratory:   Patient denies cough and shortness of breath.  ?Endocrine:   Patient denies excessive thirst.  ?Musculoskeletal:   Patient reports joint pain. Patient denies back pain.  ?Neurological:   Patient denies headaches and dizziness.  ?Psychologic:   Patient denies depression and anxiety.  ? ?Notes: Mass on right testicle noticed after having covid; denies pain ?  ?BP 125/81   Pulse 63   Temp 98.5 ?F (36.9 ?C) (Oral)   Resp 18   Ht 5\' 9"  (1.753 m)   Wt 74.8 kg   SpO2 100%   BMI 24.37 kg/m?  ? ? ?GU PHYSICAL EXAMINATION:    ?Testes: Right testicle firm and consistent with a right-sided testicular mass. No left-sided mass  ?Penis: Circumcised, no warts, no cracks. No dorsal Peyronie's plaques, no left corporal Peyronie's plaques, no right corporal Peyronie's plaques, no scarring, no warts. No balanitis, no meatal stenosis.  ? ?MULTI-SYSTEM PHYSICAL EXAMINATION:    ?Constitutional: Well-nourished. No physical deformities. Normally developed. Good grooming.  ?Respiratory: No labored breathing, no use of accessory muscles.   ?Cardiovascular: Normal temperature, normal extremity pulses, no swelling, no varicosities.  ?Skin: No paleness, no jaundice, no cyanosis. No lesion, no  ulcer, no rash.  ?Neurologic / Psychiatric: Oriented to time, oriented to place, oriented to person. No depression, no anxiety, no agitation.  ?Gastrointestinal: No mass, no tenderness, no rigidity, non obese abdomen.  ?Eyes: Normal conjunctivae. Normal eyelids.  ?Musculoskeletal: Normal  gait and station of head and neck.  ? ?  ?Complexity of Data:  ?Source Of History:  Patient  ?Records Review:   Previous Doctor Records, Previous Hospital Records, Previous Patient Records  ?Urine Test Review:   Urinalysis  ?X-Ray Review: Scrotal Ultrasound: Reviewed Films. Reviewed Report. Discussed With Patient.  ?  ? ?PROCEDURES:    ? ?     Urinalysis ?Dipstick Dipstick Cont'd  ?Color: Yellow Bilirubin: Neg mg/dL  ?Appearance: Clear Ketones: Neg mg/dL  ?Specific Gravity: <=1.005 Blood: Neg ery/uL  ?pH: 7.0 Protein: Neg mg/dL  ?Glucose: Neg mg/dL Urobilinogen: 0.2 mg/dL  ?  Nitrites: Neg  ?  Leukocyte Esterase: Neg leu/uL  ? ? ?ASSESSMENT:  ?    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 ?PLAN:    ? ?      Orders ?Labs Beta HCG, Alpha Fetoprotein (AFP), LDH  ?X-Rays: C.T. Chest/ Abd/Pelvis With I.V. Contrast  ? ? ?      Schedule ?Return Visit/Planned Activity: 1 Week - C.T. Chest/ Abd/Pelvis  ? ? ?      Document ?Letter(s):  Created for Patient: Clinical Summary  ? ? ?     Notes:   Physical exam and imaging findings concerning for right-sided testicular cancer. We will obtain tumor markers today. He recently had a cardiac stent placed and ideally he would remain on dual antiplatelet therapy for 6 months. Per Dr.Chandrasekhar, he would hope to defer the procedure for at least 3 months but we could consider stopping temporarily for a procedure. I will need to discuss with him or Dr. Acie Fredrickson in regards to being able to stop his blood thinner. Aspirin 81 mg is okay. Ideal treatment would be a right inguinal orchiectomy. Would be preferred to do this as soon as possible. We can start with obtaining tumor markers. We can consider CT of the chest, abdomen, pelvis to rule out metastatic disease. If markers are negative and CT is negative, may be able to postpone  surgery to 3-6 months post PCI. If positive, then he would need sooner intervention.  ? ?CC: Dr. Gaye Pollack  ?Dr. Gasper Sells  ? ? ?  ? ? ?APPENDED NOTES:  ? ? ?Beta-hCG mildly elevated. CT of the chest, abdomen, pelvis negative for obvious metastatic disease. I presented his case in multidisciplinary conference. General consensus was to proceed with surgery in order allow for cure and minimize the risk of metastatic spread. Though he does have a slightly higher risk of perioperative MI, he would be high risk for complications from chemotherapy if he were to develop metastatic disease. I discussed this with the patient. He understands all risk and would like to proceed.  ? ? ?Case was discussed over secure messaging with his cardiologist.  He was also discussed extensively during multidisciplinary oncology conference.  Collective decision was made that the benefits outweigh the risk of proceeding with radical orchiectomy. ? ?Signed by Link Snuffer, III, M.D. on 03/31/21 at 4:58 PM (EST ?

## 2021-04-19 NOTE — Anesthesia Procedure Notes (Signed)
Procedure Name: Intubation ?Date/Time: 04/19/2021 11:41 AM ?Performed by: Niel Hummer, CRNA ?Pre-anesthesia Checklist: Patient identified, Emergency Drugs available, Suction available and Patient being monitored ?Patient Re-evaluated:Patient Re-evaluated prior to induction ?Oxygen Delivery Method: Circle system utilized ?Preoxygenation: Pre-oxygenation with 100% oxygen ?Induction Type: IV induction ?Ventilation: Mask ventilation without difficulty ?Laryngoscope Size: Mac and 4 ?Grade View: Grade I ?Tube type: Oral ?Tube size: 7.5 mm ?Number of attempts: 1 ?Airway Equipment and Method: Stylet ?Placement Confirmation: ETT inserted through vocal cords under direct vision, positive ETCO2 and breath sounds checked- equal and bilateral ?Secured at: 23 cm ?Tube secured with: Tape ?Dental Injury: Teeth and Oropharynx as per pre-operative assessment  ? ? ? ? ?

## 2021-04-19 NOTE — Discharge Instructions (Addendum)
Discharge instructions following scrotal surgery ? ?Start your Plavix back on Monday morning ? ?Call your doctor for: ?Fever is greater than 100.5 ?Severe nausea or vomiting ?Increasing pain not controlled by pain medication ?Increasing redness or drainage from incisions ? ?The number for questions or concerns is [phone] ? ?Activity level: ?No lifting greater than 20 pounds (about equal to milk) for the next 2 weeks or until cleared to do so at follow-up appointment.  Otherwise activity as tolerated by comfort level. ? ?Diet: ?May resume your regular diet as tolerated ? ?Driving: ?No driving while still taking opiate pain medications (weight at least 6-8 hours after last dose).  No driving if you still sore from surgery as it may limit her ability to react quickly if necessary. ? ? ?Shower/bath: ?May shower and get incision wet pad dry immediately following.  Do not scrub vigorously for the next 2-3 weeks.  Do not soak incision (ID soaking in bath or swimming) until told he may do so by Dr., as this may promote a wound infection. ? ?Wound care: ?He may cover wounds with sterile gauze as needed to prevent incisions rubbing on close follow-up in any seepage.  Where tight fitting underpants/scrotal support for at least 2 weeks.  He should apply cold compresses (ice or sac of frozen peas/corn) to your scrotum for at least 48 hours to reduce the swelling for 15 minutes at a time indirectly.  You should expect that his scrotum will swell up initially and then get smaller over the next 2-4 weeks. ? ?Follow-up appointments: ?Follow-up appointment will be scheduled with Dr. Gloriann Loan for a wound check. ? ?

## 2021-04-19 NOTE — Op Note (Signed)
Operative Note ? ?Preoperative diagnosis:  ?1.  Right testicular mass ? ?Postoperative diagnosis: ?1.  Right testicular mass ? ?Procedure(s): ?1.  Right inguinal radical orchiectomy ? ?Surgeon: Link Snuffer, MD ? ?Assistants: Will [place], resident, MD ? ?Anesthesia: General ? ?Complications: None immediate ? ?EBL: 30 cc ? ?Specimens: ?1.  Right testicle and high ligation of spermatic cord ? ?Drains/Catheters: ?1.  None ? ?Intraoperative findings: Right testicular mass.  Spermatic cord was excised with high ligation. ? ?Indication: 71 year old male with a right testicular tumor presents for radical orchiectomy. ? ?Description of procedure: ? ?The patient was identified and consent was obtained.  The patient was taken to the operating room and placed in the supine position.  The patient was placed under general anesthesia.  Perioperative antibiotics were administered.  Patient was prepped and draped in a standard sterile fashion and a timeout was performed. ? ?A 6 cm right inguinal incision was made sharply.  This was carried down with Bovie electrocautery.  Camper's and Scarpa's fascia was divided.  External oblique aponeurosis was encountered.  A punch incision was made with scalpel.  This was then extended along the fibers proximally and distally with Metzenbaum scissors.  Tissue was dissected away from the aponeurosis.  The cord was identified and a Penrose drain was passed underneath it.  The testicle was delivered into the operative field and gubernacular attachments were released.  Care was taken not to violate the scrotal skin.  Once the testicle was released, I inspected the scrotal skin and there was no violation.  I dissected the cord as proximally as I could.  The vas deferens was separated from the remainder of the cord.  A clamp was placed around this.  2 Kelly clamps were placed around the vascular portion of the cord.  Testicle was excised and sent off for specimen.  2-0 silk tie was used to tie off the  vas deferens.  Clamp was released.  2-0 stick tie was used to suture ligate the distal portion of the vascular portion of the spermatic cord and the clamp was released.  The more proximal portion was suture-ligated with a 2-0 silk tie.  There is no evidence of any bleeding from the stump.  I inspected the wound bed and there was no evidence of any active bleeding.  The ilioinguinal nerve was identified and was not divided.  The external oblique aponeurosis was reapproximated with a running 2-0 Vicryl.  Subcutaneous tissue was reapproximated with 2-0 Vicryl followed by closure of the skin with 4-0 Monocryl running subcuticular suture.  Quarter percent Marcaine was used for anesthetic effect.  Dermabond was applied this include the operation.  Patient tolerated the procedure well and was stable postoperative. ? ?Plan: Follow-up in 7 to 10 days for pathology review and wound check. ? ?

## 2021-04-19 NOTE — Interval H&P Note (Signed)
History and Physical Interval Note: ? ?04/19/2021 ?11:11 AM ? ?Roberto Ponce  has presented today for surgery, with the diagnosis of RIGHT TESTICLE MASS.  The various methods of treatment have been discussed with the patient and family. After consideration of risks, benefits and other options for treatment, the patient has consented to  Procedure(s) with comments: ?RIGHT RADICAL INGUINAL ORCHIECTOMY (Right) - 1 HR FOR CASE as a surgical intervention.  The patient's history has been reviewed, patient examined, no change in status, stable for surgery.  I have reviewed the patient's chart and labs.  Questions were answered to the patient's satisfaction.   ? ? ?Marton Redwood, III ? ? ?

## 2021-04-20 ENCOUNTER — Encounter (HOSPITAL_COMMUNITY): Payer: Self-pay | Admitting: Urology

## 2021-04-24 LAB — SURGICAL PATHOLOGY

## 2021-05-01 ENCOUNTER — Telehealth: Payer: Self-pay | Admitting: Oncology

## 2021-05-01 NOTE — Telephone Encounter (Signed)
Scheduled appt per 3/13 referral. Pt is aware of appt date and time. Pt is aware to arrive 15 mins prior to appt time and to bring and updated insurance card. Pt is aware of appt location.   ?

## 2021-05-12 ENCOUNTER — Ambulatory Visit: Payer: PPO | Admitting: Oncology

## 2021-05-18 ENCOUNTER — Inpatient Hospital Stay: Payer: PPO | Attending: Oncology | Admitting: Oncology

## 2021-05-18 ENCOUNTER — Other Ambulatory Visit: Payer: Self-pay

## 2021-05-18 VITALS — BP 112/64 | HR 69 | Temp 97.9°F | Resp 17 | Ht 69.0 in | Wt 164.3 lb

## 2021-05-18 DIAGNOSIS — C6291 Malignant neoplasm of right testis, unspecified whether descended or undescended: Secondary | ICD-10-CM | POA: Diagnosis not present

## 2021-05-18 DIAGNOSIS — C6290 Malignant neoplasm of unspecified testis, unspecified whether descended or undescended: Secondary | ICD-10-CM | POA: Insufficient documentation

## 2021-05-18 NOTE — Progress Notes (Signed)
?Reason for the request:    Testicular cancer ? ?HPI: I was asked by Dr. Gloriann Loan to evaluate Roberto Ponce for evaluation of seminoma.  He is a 71 year old who was found to have a right testicular mass after catheterization in December 2022.  Scrotal ultrasound at that time showed a 4 cm heterogeneous right sided solid testicular mass suspicious for neoplasm.  He was evaluated by Dr. Gloriann Loan and underwent right inguinal radical orchiectomy on April 19, 2021.  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  The final pathological staging was T2NX.  CT scan images did not show any evidence of metastatic disease.  Since his surgery, he reports feeling well without any major complaints.  He has tolerated surgery without any residual issues.  He has resumed most activities of daily living at this time. ? ?He does not report any headaches, blurry vision, syncope or seizures. Does not report any fevers, chills or sweats.  Does not report any cough, wheezing or hemoptysis.  Does not report any chest pain, palpitation, orthopnea or leg edema.  Does not report any nausea, vomiting or abdominal pain.  Does not report any constipation or diarrhea.  Does not report any skeletal complaints.    Does not report frequency, urgency or hematuria.  Does not report any skin rashes or lesions. Does not report any heat or cold intolerance.  Does not report any lymphadenopathy or petechiae.  Does not report any anxiety or depression.  Remaining review of systems is negative.  ? ? ? ?Past Medical History:  ?Diagnosis Date  ? Asthma   ? Coronary artery disease   ? Glaucoma   ? History of gallstones   ? Hyperlipidemia   ? LVH (left ventricular hypertrophy)   ? MI (myocardial infarction) ([place])   ? MI, old   ? Mild aortic sclerosis   ? Mitral regurgitation   ? Mixed dyslipidemia   ? Venous stasis   ?: ? ? ?Past Surgical History:  ?Procedure Laterality Date  ? CARDIAC  CATHETERIZATION  09/09/1986  ? EF 56%  ? CARDIOVASCULAR STRESS TEST  12/16/2007  ? EF 54% NO ISCHEMIA  ? CHOLECYSTECTOMY    ? CORONARY ARTERY BYPASS GRAFT    ? LIMA TO THE LAD AND RIMA TO RIGHT CORONARY ARTERY  ? CORONARY STENT INTERVENTION N/A 02/14/2021  ? Procedure: CORONARY STENT INTERVENTION;  Surgeon: Sherren Mocha, MD;  Location: [facility];  Service: Cardiovascular;  Laterality: N/A;  ? ORCHIECTOMY Right 04/19/2021  ? Procedure: RIGHT RADICAL INGUINAL ORCHIECTOMY;  Surgeon: Lucas Mallow, MD;  Location: [facility];  Service: Urology;  Laterality: Right;  1 HR FOR CASE  ? RIGHT/LEFT HEART CATH AND CORONARY/GRAFT ANGIOGRAPHY N/A 02/14/2021  ? Procedure: RIGHT/LEFT HEART CATH AND CORONARY/GRAFT ANGIOGRAPHY;  Surgeon: Sherren Mocha, MD;  Location: [facility];  Service: Cardiovascular;  Laterality: N/A;  ? TEE WITHOUT CARDIOVERSION N/A 09/02/2020  ? Procedure: TRANSESOPHAGEAL ECHOCARDIOGRAM (TEE);  Surgeon: Werner Lean, MD;  Location: [facility];  Service: Cardiovascular;  Laterality: N/A;  ? US ECHOCARDIOGRAPHY  12/12/2007  ? EF 55-60%  ? VASECTOMY  1988  ?: ? ? ?Current Outpatient Medications:  ?  acetaminophen (TYLENOL) 500 MG tablet, Take 1,000 mg by mouth every 6 (six) hours as needed for moderate pain., Disp: , Rfl:  ?  aspirin EC 81 MG tablet, Take 1 tablet (81 mg total) by mouth daily., Disp: , Rfl:  ?  brimonidine (  ALPHAGAN) 0.2 % ophthalmic solution, Place 1 drop into the left eye 2 (two) times daily., Disp: , Rfl:  ?  clopidogrel (PLAVIX) 75 MG tablet, Take 1 tablet (75 mg total) by mouth daily with breakfast., Disp: 90 tablet, Rfl: 3 ?  dorzolamide-timolol (COSOPT) 22.3-6.8 MG/ML ophthalmic solution, Place 1 drop into both eyes 2 (two) times daily., Disp: , Rfl:  ?  ezetimibe (ZETIA) 10 MG tablet, TAKE 1 TABLET(10 MG) BY MOUTH DAILY (Patient taking differently: Take 10 mg by mouth at bedtime.), Disp: 90 tablet, Rfl: 1 ?  latanoprost (XALATAN) 0.005 % ophthalmic  solution, Place 1 drop into both eyes at bedtime. , Disp: , Rfl:  ?  nitroGLYCERIN (NITROSTAT) 0.4 MG SL tablet, Place 1 tablet (0.4 mg total) under the tongue every 5 (five) minutes as needed for chest pain., Disp: 25 tablet, Rfl: 3 ?  oxycodone (OXY-IR) 5 MG capsule, Take 1 capsule (5 mg total) by mouth every 4 (four) hours as needed., Disp: 10 capsule, Rfl: 0 ?  rosuvastatin (CRESTOR) 40 MG tablet, Take 40 mg by mouth at bedtime., Disp: , Rfl:  ?  triamcinolone cream (KENALOG) 0.1 %, Apply 1 application topically 2 (two) times daily as needed (eczema)., Disp: , Rfl: : ? ?No Known Allergies: ? ? ?Family History  ?Problem Relation Age of Onset  ? Heart attack Father   ? Stroke Father   ?: ? ? ?Social History  ? ?Socioeconomic History  ? Marital status: Married  ?  Spouse name: Not on file  ? Number of children: Not on file  ? Years of education: Not on file  ? Highest education level: Not on file  ?Occupational History  ? Not on file  ?Tobacco Use  ? Smoking status: Never  ? Smokeless tobacco: Never  ?Vaping Use  ? Vaping Use: Never used  ?Substance and Sexual Activity  ? Alcohol use: No  ? Drug use: No  ? Sexual activity: Not on file  ?Other Topics Concern  ? Not on file  ?Social History Narrative  ? Not on file  ? ?Social Determinants of Health  ? ?Financial Resource Strain: Not on file  ?Food Insecurity: Not on file  ?Transportation Needs: Not on file  ?Physical Activity: Not on file  ?Stress: Not on file  ?Social Connections: Not on file  ?Intimate Partner Violence: Not on file  ?: ? ?Pertinent items are noted in HPI. ? ?Exam: ?Blood pressure 112/64, pulse 69, temperature 97.9 ?F (36.6 ?C), temperature source Temporal, resp. rate 17, height '5\' 9"'$  (1.753 m), weight 164 lb 4.8 oz (74.5 kg), SpO2 97 %. ? ?ECOG 1 ?General appearance: alert and cooperative appeared without distress. ?Head: atraumatic without any abnormalities. ?Eyes: conjunctivae/corneas clear. PERRL.  Sclera anicteric. ?Throat: lips, mucosa, and  tongue normal; without oral thrush or ulcers. ?Resp: clear to auscultation bilaterally without rhonchi, wheezes or dullness to percussion. ?Cardio: regular rate and rhythm, S1, S2 normal, no murmur, click, rub or gallop ?GI: soft, non-tender; bowel sounds normal; no masses,  no organomegaly ?Skin: Skin color, texture, turgor normal. No rashes or lesions ?Lymph nodes: Cervical, supraclavicular, and axillary nodes normal. ?Neurologic: Grossly normal without any motor, sensory or deep tendon reflexes. ?Musculoskeletal: No joint deformity or effusion. ? ? ? ?Assessment and Plan:  ? ? ?71 year old with: ? ?1.  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  He was found to have T2N0 clinical stage with lymphovascular invasion but no additional adverse features.  CT scan chest abdomen and pelvis on  March 16, 2021 showed no evidence of metastatic disease.  Tumor markers were all within normal range at that time. ? ?The natural course of this disease at this time and treatment choices were discussed in the setting of stage Ib pure seminoma with lymphovascular invasion.  The standard of care remains active surveillance at this time despite his adverse features.  Additional adjuvant therapy in the form of single agent carboplatin versus radiation were discussed.  Complication associated with carboplatin chemotherapy that include nausea, vomiting, myelosuppression, neutropenia, thrombocytopenia and other infusion related complications.  Delayed complications such as secondary leukemia is less of a concern given his age.  Complication associated with radiation include GI toxicity and bone marrow issues were reiterated. ? ? I feel benefits of additional therapy is rather marginal and the likelihood of his disease being cured with orchiectomy alone is very high.  ? ?Given his age, comorbidities I recommended active surveillance as well.  He is scheduled to have repeat surveillance imaging by Dr. Gloriann Loan in the next few months.   He is agreeable and comfortable with this decision. ? ?2.  I am happy to see him in the future as needed. ? ?60  minutes were dedicated to this visit. The time was spent on reviewing laboratory data, imaging studies, d

## 2021-05-27 ENCOUNTER — Encounter: Payer: Self-pay | Admitting: Cardiovascular Disease

## 2021-05-27 ENCOUNTER — Encounter: Payer: Self-pay | Admitting: Internal Medicine

## 2021-06-15 DIAGNOSIS — B349 Viral infection, unspecified: Secondary | ICD-10-CM | POA: Diagnosis not present

## 2021-06-15 DIAGNOSIS — R52 Pain, unspecified: Secondary | ICD-10-CM | POA: Diagnosis not present

## 2021-06-15 DIAGNOSIS — R509 Fever, unspecified: Secondary | ICD-10-CM | POA: Diagnosis not present

## 2021-06-15 DIAGNOSIS — Z03818 Encounter for observation for suspected exposure to other biological agents ruled out: Secondary | ICD-10-CM | POA: Diagnosis not present

## 2021-06-15 DIAGNOSIS — R051 Acute cough: Secondary | ICD-10-CM | POA: Diagnosis not present

## 2021-06-18 ENCOUNTER — Inpatient Hospital Stay (HOSPITAL_COMMUNITY)
Admission: EM | Admit: 2021-06-18 | Discharge: 2021-06-28 | DRG: 291 | Disposition: A | Discharge status: Home Health | Payer: PPO | Attending: Family Medicine | Admitting: Family Medicine

## 2021-06-18 ENCOUNTER — Other Ambulatory Visit: Payer: Self-pay

## 2021-06-18 ENCOUNTER — Emergency Department (HOSPITAL_COMMUNITY): Payer: PPO

## 2021-06-18 ENCOUNTER — Encounter (HOSPITAL_COMMUNITY): Payer: Self-pay | Admitting: Oncology

## 2021-06-18 DIAGNOSIS — I700 Atherosclerosis of aorta: Secondary | ICD-10-CM | POA: Diagnosis present

## 2021-06-18 DIAGNOSIS — I5023 Acute on chronic systolic (congestive) heart failure: Principal | ICD-10-CM | POA: Diagnosis present

## 2021-06-18 DIAGNOSIS — Z8547 Personal history of malignant neoplasm of testis: Secondary | ICD-10-CM | POA: Diagnosis not present

## 2021-06-18 DIAGNOSIS — R739 Hyperglycemia, unspecified: Secondary | ICD-10-CM | POA: Diagnosis present

## 2021-06-18 DIAGNOSIS — R7989 Other specified abnormal findings of blood chemistry: Secondary | ICD-10-CM

## 2021-06-18 DIAGNOSIS — I5021 Acute systolic (congestive) heart failure: Secondary | ICD-10-CM | POA: Diagnosis not present

## 2021-06-18 DIAGNOSIS — A419 Sepsis, unspecified organism: Secondary | ICD-10-CM

## 2021-06-18 DIAGNOSIS — R059 Cough, unspecified: Secondary | ICD-10-CM | POA: Diagnosis not present

## 2021-06-18 DIAGNOSIS — Z955 Presence of coronary angioplasty implant and graft: Secondary | ICD-10-CM | POA: Diagnosis not present

## 2021-06-18 DIAGNOSIS — E222 Syndrome of inappropriate secretion of antidiuretic hormone: Secondary | ICD-10-CM | POA: Diagnosis present

## 2021-06-18 DIAGNOSIS — E782 Mixed hyperlipidemia: Secondary | ICD-10-CM | POA: Diagnosis present

## 2021-06-18 DIAGNOSIS — R609 Edema, unspecified: Secondary | ICD-10-CM | POA: Diagnosis not present

## 2021-06-18 DIAGNOSIS — Z8249 Family history of ischemic heart disease and other diseases of the circulatory system: Secondary | ICD-10-CM

## 2021-06-18 DIAGNOSIS — J80 Acute respiratory distress syndrome: Secondary | ICD-10-CM | POA: Diagnosis present

## 2021-06-18 DIAGNOSIS — Z7982 Long term (current) use of aspirin: Secondary | ICD-10-CM

## 2021-06-18 DIAGNOSIS — Z9889 Other specified postprocedural states: Secondary | ICD-10-CM | POA: Diagnosis not present

## 2021-06-18 DIAGNOSIS — J811 Chronic pulmonary edema: Secondary | ICD-10-CM | POA: Diagnosis not present

## 2021-06-18 DIAGNOSIS — I878 Other specified disorders of veins: Secondary | ICD-10-CM | POA: Diagnosis present

## 2021-06-18 DIAGNOSIS — R531 Weakness: Secondary | ICD-10-CM | POA: Diagnosis not present

## 2021-06-18 DIAGNOSIS — T465X5A Adverse effect of other antihypertensive drugs, initial encounter: Secondary | ICD-10-CM | POA: Diagnosis present

## 2021-06-18 DIAGNOSIS — I050 Rheumatic mitral stenosis: Secondary | ICD-10-CM | POA: Diagnosis present

## 2021-06-18 DIAGNOSIS — D72829 Elevated white blood cell count, unspecified: Secondary | ICD-10-CM

## 2021-06-18 DIAGNOSIS — R945 Abnormal results of liver function studies: Secondary | ICD-10-CM | POA: Diagnosis not present

## 2021-06-18 DIAGNOSIS — I25810 Atherosclerosis of coronary artery bypass graft(s) without angina pectoris: Secondary | ICD-10-CM | POA: Diagnosis present

## 2021-06-18 DIAGNOSIS — R570 Cardiogenic shock: Secondary | ICD-10-CM | POA: Diagnosis present

## 2021-06-18 DIAGNOSIS — I952 Hypotension due to drugs: Secondary | ICD-10-CM | POA: Diagnosis present

## 2021-06-18 DIAGNOSIS — J9601 Acute respiratory failure with hypoxia: Secondary | ICD-10-CM | POA: Diagnosis not present

## 2021-06-18 DIAGNOSIS — Z9049 Acquired absence of other specified parts of digestive tract: Secondary | ICD-10-CM

## 2021-06-18 DIAGNOSIS — Z7902 Long term (current) use of antithrombotics/antiplatelets: Secondary | ICD-10-CM | POA: Diagnosis not present

## 2021-06-18 DIAGNOSIS — R0602 Shortness of breath: Secondary | ICD-10-CM | POA: Diagnosis present

## 2021-06-18 DIAGNOSIS — J189 Pneumonia, unspecified organism: Secondary | ICD-10-CM | POA: Diagnosis not present

## 2021-06-18 DIAGNOSIS — T380X5A Adverse effect of glucocorticoids and synthetic analogues, initial encounter: Secondary | ICD-10-CM | POA: Diagnosis present

## 2021-06-18 DIAGNOSIS — I252 Old myocardial infarction: Secondary | ICD-10-CM

## 2021-06-18 DIAGNOSIS — Z4682 Encounter for fitting and adjustment of non-vascular catheter: Secondary | ICD-10-CM | POA: Diagnosis not present

## 2021-06-18 DIAGNOSIS — J90 Pleural effusion, not elsewhere classified: Secondary | ICD-10-CM | POA: Diagnosis not present

## 2021-06-18 DIAGNOSIS — I2510 Atherosclerotic heart disease of native coronary artery without angina pectoris: Secondary | ICD-10-CM | POA: Diagnosis present

## 2021-06-18 DIAGNOSIS — I255 Ischemic cardiomyopathy: Secondary | ICD-10-CM | POA: Diagnosis present

## 2021-06-18 DIAGNOSIS — I340 Nonrheumatic mitral (valve) insufficiency: Secondary | ICD-10-CM | POA: Diagnosis not present

## 2021-06-18 DIAGNOSIS — E7849 Other hyperlipidemia: Secondary | ICD-10-CM | POA: Diagnosis not present

## 2021-06-18 DIAGNOSIS — I248 Other forms of acute ischemic heart disease: Secondary | ICD-10-CM | POA: Diagnosis present

## 2021-06-18 DIAGNOSIS — Z79899 Other long term (current) drug therapy: Secondary | ICD-10-CM

## 2021-06-18 DIAGNOSIS — Z452 Encounter for adjustment and management of vascular access device: Secondary | ICD-10-CM | POA: Diagnosis not present

## 2021-06-18 DIAGNOSIS — D649 Anemia, unspecified: Secondary | ICD-10-CM | POA: Diagnosis present

## 2021-06-18 DIAGNOSIS — Z9079 Acquired absence of other genital organ(s): Secondary | ICD-10-CM | POA: Diagnosis not present

## 2021-06-18 DIAGNOSIS — Z823 Family history of stroke: Secondary | ICD-10-CM | POA: Diagnosis not present

## 2021-06-18 DIAGNOSIS — R846 Abnormal cytological findings in specimens from respiratory organs and thorax: Secondary | ICD-10-CM | POA: Diagnosis not present

## 2021-06-18 DIAGNOSIS — Z20822 Contact with and (suspected) exposure to covid-19: Secondary | ICD-10-CM | POA: Diagnosis present

## 2021-06-18 DIAGNOSIS — R579 Shock, unspecified: Secondary | ICD-10-CM

## 2021-06-18 DIAGNOSIS — R509 Fever, unspecified: Secondary | ICD-10-CM | POA: Diagnosis not present

## 2021-06-18 DIAGNOSIS — R918 Other nonspecific abnormal finding of lung field: Secondary | ICD-10-CM | POA: Diagnosis not present

## 2021-06-18 DIAGNOSIS — E871 Hypo-osmolality and hyponatremia: Secondary | ICD-10-CM

## 2021-06-18 LAB — RESPIRATORY PANEL BY PCR

## 2021-06-18 LAB — COMPREHENSIVE METABOLIC PANEL
ALT: 104 U/L — ABNORMAL HIGH (ref 0–44)
AST: 96 U/L — ABNORMAL HIGH (ref 15–41)
Albumin: 3 g/dL — ABNORMAL LOW (ref 3.5–5.0)
Alkaline Phosphatase: 61 U/L (ref 38–126)
Anion gap: 8 (ref 5–15)
BUN: 8 mg/dL (ref 8–23)
CO2: 23 mmol/L (ref 22–32)
Calcium: 7.9 mg/dL — ABNORMAL LOW (ref 8.9–10.3)
Chloride: 89 mmol/L — ABNORMAL LOW (ref 98–111)
Creatinine, Ser: 0.63 mg/dL (ref 0.61–1.24)
GFR, Estimated: >60 mL/min (ref >60)
Glucose, Bld: 128 mg/dL — ABNORMAL HIGH (ref 70–99)
Potassium: 3.7 mmol/L (ref 3.5–5.1)
Sodium: 120 mmol/L — ABNORMAL LOW (ref 135–145)
Total Bilirubin: 0.5 mg/dL (ref 0.3–1.2)
Total Protein: 6.5 g/dL (ref 6.5–8.1)

## 2021-06-18 LAB — URINALYSIS, ROUTINE W REFLEX MICROSCOPIC
Bacteria, UA: NONE SEEN
Bilirubin Urine: NEGATIVE
Glucose, UA: NEGATIVE mg/dL
Hgb urine dipstick: NEGATIVE
Ketones, ur: NEGATIVE mg/dL
Leukocytes,Ua: NEGATIVE
Nitrite: NEGATIVE
Protein, ur: 30 mg/dL — AB
Specific Gravity, Urine: 1.02 (ref 1.005–1.030)
pH: 6 (ref 5.0–8.0)

## 2021-06-18 LAB — CBC WITH DIFFERENTIAL/PLATELET
Abs Immature Granulocytes: 0.05 10*3/uL (ref 0.00–0.07)
Basophils Absolute: 0 10*3/uL (ref 0.0–0.1)
Basophils Relative: 0 %
Eosinophils Absolute: 0 10*3/uL (ref 0.0–0.5)
Eosinophils Relative: 0 %
HCT: 31.4 % — ABNORMAL LOW (ref 39.0–52.0)
Hemoglobin: 11.1 g/dL — ABNORMAL LOW (ref 13.0–17.0)
Immature Granulocytes: 1 %
Lymphocytes Relative: 7 %
Lymphs Abs: 0.8 10*3/uL (ref 0.7–4.0)
MCH: 30.1 pg (ref 26.0–34.0)
MCHC: 35.4 g/dL (ref 30.0–36.0)
MCV: 85.1 fL (ref 80.0–100.0)
Monocytes Absolute: 1.4 10*3/uL — ABNORMAL HIGH (ref 0.1–1.0)
Monocytes Relative: 12 %
Neutro Abs: 8.8 10*3/uL — ABNORMAL HIGH (ref 1.7–7.7)
Neutrophils Relative %: 80 %
Platelets: 166 10*3/uL (ref 150–400)
RBC: 3.69 MIL/uL — ABNORMAL LOW (ref 4.22–5.81)
RDW: 13.4 % (ref 11.5–15.5)
WBC: 11.1 10*3/uL — ABNORMAL HIGH (ref 4.0–10.5)
nRBC: 0 % (ref 0.0–0.2)

## 2021-06-18 LAB — LACTIC ACID, PLASMA
Lactic Acid, Venous: 1.3 mmol/L (ref 0.5–1.9)
Lactic Acid, Venous: 1.2 mmol/L (ref 0.5–1.9)

## 2021-06-18 LAB — CREATININE, SERUM
Creatinine, Ser: 0.56 mg/dL — ABNORMAL LOW (ref 0.61–1.24)
GFR, Estimated: >60 mL/min (ref >60)

## 2021-06-18 LAB — CBC
HCT: 29.9 % — ABNORMAL LOW (ref 39.0–52.0)
Hemoglobin: 10.6 g/dL — ABNORMAL LOW (ref 13.0–17.0)
MCH: 30.5 pg (ref 26.0–34.0)
MCHC: 35.5 g/dL (ref 30.0–36.0)
MCV: 85.9 fL (ref 80.0–100.0)
Platelets: 159 10*3/uL (ref 150–400)
RBC: 3.48 MIL/uL — ABNORMAL LOW (ref 4.22–5.81)
RDW: 13.5 % (ref 11.5–15.5)
WBC: 11.1 10*3/uL — ABNORMAL HIGH (ref 4.0–10.5)
nRBC: 0 % (ref 0.0–0.2)

## 2021-06-18 LAB — RESP PANEL BY RT-PCR (FLU A&B, COVID) ARPGX2
Influenza A by PCR: NEGATIVE
Influenza B by PCR: NEGATIVE
SARS Coronavirus 2 by RT PCR: NEGATIVE

## 2021-06-18 LAB — MAGNESIUM: Magnesium: 1.6 mg/dL — ABNORMAL LOW (ref 1.7–2.4)

## 2021-06-18 LAB — HIV ANTIBODY (ROUTINE TESTING W REFLEX): HIV Screen 4th Generation wRfx: NONREACTIVE

## 2021-06-18 LAB — PROCALCITONIN: Procalcitonin: <0.1 ng/mL

## 2021-06-18 MED ORDER — ASPIRIN EC 81 MG PO TBEC
81.0000 mg | DELAYED_RELEASE_TABLET | Freq: Every day | ORAL | Status: DC
  Administered 2021-06-19 – 2021-06-20 (×2): 81 mg via ORAL
  Filled 2021-06-18 (×2): qty 1

## 2021-06-18 MED ORDER — SODIUM CHLORIDE 0.9 % IV SOLN: INTRAVENOUS | Status: DC | PRN

## 2021-06-18 MED ORDER — DORZOLAMIDE HCL-TIMOLOL MAL 2-0.5 % OP SOLN
1.0000 [drp] | Freq: Two times a day (BID) | OPHTHALMIC | Status: DC
  Administered 2021-06-18 – 2021-06-28 (×20): 1 [drp] via OPHTHALMIC
  Filled 2021-06-18 (×2): qty 10

## 2021-06-18 MED ORDER — LATANOPROST 0.005 % OP SOLN
1.0000 [drp] | Freq: Every day | OPHTHALMIC | Status: DC
  Administered 2021-06-18 – 2021-06-27 (×10): 1 [drp] via OPHTHALMIC
  Filled 2021-06-18: qty 2.5

## 2021-06-18 MED ORDER — LACTATED RINGERS IV SOLN: INTRAVENOUS | Status: DC

## 2021-06-18 MED ORDER — ROSUVASTATIN CALCIUM 20 MG PO TABS
40.0000 mg | ORAL_TABLET | Freq: Every day | ORAL | Status: DC
  Administered 2021-06-18 – 2021-06-21 (×3): 40 mg via ORAL
  Filled 2021-06-18 (×2): qty 4
  Filled 2021-06-18: qty 2

## 2021-06-18 MED ORDER — ONDANSETRON HCL 4 MG PO TABS
4.0000 mg | ORAL_TABLET | Freq: Four times a day (QID) | ORAL | Status: DC | PRN
Start: 2021-06-18 — End: 2021-06-28

## 2021-06-18 MED ORDER — OXYCODONE HCL 5 MG PO CAPS: 5.0000 mg | ORAL_CAPSULE | ORAL | Status: DC | PRN

## 2021-06-18 MED ORDER — SODIUM CHLORIDE 0.9 % IV SOLN
500.0000 mg | Freq: Once | INTRAVENOUS | Status: AC
  Administered 2021-06-18: 500 mg via INTRAVENOUS
  Filled 2021-06-18: qty 5

## 2021-06-18 MED ORDER — SODIUM CHLORIDE 0.9 % IV SOLN
1.0000 g | Freq: Once | INTRAVENOUS | Status: AC
  Administered 2021-06-18: 1 g via INTRAVENOUS
  Filled 2021-06-18: qty 10

## 2021-06-18 MED ORDER — IPRATROPIUM-ALBUTEROL 0.5-2.5 (3) MG/3ML IN SOLN
3.0000 mL | Freq: Two times a day (BID) | RESPIRATORY_TRACT | Status: DC
  Administered 2021-06-18 – 2021-06-20 (×4): 3 mL via RESPIRATORY_TRACT
  Filled 2021-06-18 (×4): qty 3

## 2021-06-18 MED ORDER — BRIMONIDINE TARTRATE 0.2 % OP SOLN
1.0000 [drp] | Freq: Two times a day (BID) | OPHTHALMIC | Status: DC
  Administered 2021-06-18 – 2021-06-28 (×20): 1 [drp] via OPHTHALMIC
  Filled 2021-06-18: qty 5

## 2021-06-18 MED ORDER — SODIUM CHLORIDE 0.9 % IV BOLUS
500.0000 mL | Freq: Once | INTRAVENOUS | Status: AC
  Administered 2021-06-18: 500 mL via INTRAVENOUS

## 2021-06-18 MED ORDER — ONDANSETRON HCL 4 MG/2ML IJ SOLN: 4.0000 mg | Freq: Four times a day (QID) | INTRAMUSCULAR | Status: DC | PRN

## 2021-06-18 MED ORDER — SODIUM CHLORIDE 0.9 % IV SOLN
500.0000 mg | INTRAVENOUS | Status: DC
  Administered 2021-06-18 – 2021-06-21 (×4): 500 mg via INTRAVENOUS
  Filled 2021-06-18 (×4): qty 5

## 2021-06-18 MED ORDER — ALBUTEROL SULFATE (2.5 MG/3ML) 0.083% IN NEBU
2.5000 mg | INHALATION_SOLUTION | Freq: Four times a day (QID) | RESPIRATORY_TRACT | Status: DC | PRN
  Administered 2021-06-19 – 2021-06-23 (×5): 2.5 mg via RESPIRATORY_TRACT
  Filled 2021-06-18 (×5): qty 3

## 2021-06-18 MED ORDER — ACETAMINOPHEN 500 MG PO TABS
1000.0000 mg | ORAL_TABLET | Freq: Four times a day (QID) | ORAL | Status: DC | PRN
  Filled 2021-06-18: qty 2

## 2021-06-18 MED ORDER — NITROGLYCERIN 0.4 MG SL SUBL: 0.4000 mg | SUBLINGUAL_TABLET | SUBLINGUAL | Status: DC | PRN

## 2021-06-18 MED ORDER — CLOPIDOGREL BISULFATE 75 MG PO TABS
75.0000 mg | ORAL_TABLET | Freq: Every day | ORAL | Status: DC
  Administered 2021-06-19 – 2021-06-20 (×2): 75 mg via ORAL
  Filled 2021-06-18 (×2): qty 1

## 2021-06-18 MED ORDER — ENOXAPARIN SODIUM 40 MG/0.4ML IJ SOSY
40.0000 mg | PREFILLED_SYRINGE | INTRAMUSCULAR | Status: DC
  Administered 2021-06-18 – 2021-06-27 (×10): 40 mg via SUBCUTANEOUS
  Filled 2021-06-18 (×10): qty 0.4

## 2021-06-18 MED ORDER — SODIUM CHLORIDE 0.9 % IV SOLN
2.0000 g | INTRAVENOUS | Status: DC
  Administered 2021-06-19: 2 g via INTRAVENOUS
  Filled 2021-06-18 (×2): qty 20

## 2021-06-18 MED ORDER — SODIUM CHLORIDE 0.9 % IV SOLN
1.0000 g | INTRAVENOUS | Status: AC
  Administered 2021-06-18: 1 g via INTRAVENOUS
  Filled 2021-06-18: qty 10

## 2021-06-18 MED ORDER — EZETIMIBE 10 MG PO TABS
10.0000 mg | ORAL_TABLET | Freq: Every day | ORAL | Status: DC
  Administered 2021-06-18 – 2021-06-21 (×3): 10 mg via ORAL
  Filled 2021-06-18 (×3): qty 1

## 2021-06-18 MED ORDER — SODIUM CHLORIDE 0.9% FLUSH
3.0000 mL | Freq: Two times a day (BID) | INTRAVENOUS | Status: DC
  Administered 2021-06-19 – 2021-06-28 (×14): 3 mL via INTRAVENOUS

## 2021-06-18 NOTE — ED Provider Notes (Signed)
?[facility] DEPT ?Provider Note ? ? ?CSN: [id number] ?Arrival date & time: 06/18/21  1344 ? ?  ? ?History ? ?Chief Complaint  ?Patient presents with  ? Flu Like Sx  ? ? ?Roberto Ponce is a 71 y.o. male. With PMH of CAD, MI s/p CABG, HLD, recent testicular seminoma s/p orchectomy who presents to the emergency department with flu like symptoms. ? ?States symptoms began on Monday.  He states that he was walking on the treadmill when he began having a coughing fit.  He states that beginning on Monday as well he started having low-grade fever around 99, body aches, chills, fatigue and decreased p.o. intake.  He states that he has also had 1 episode of diarrhea.  He states that he went to urgent care on Thursday and was diagnosed with viral upper respiratory infection and sent home.  He states that his symptoms or not improving he is having ongoing cough.  He does intermittently feel short of breath.  He denies any chest pain, palpitations, lower extremity swelling, orthopnea, PND, dysuria, abdominal pain. ? ? ?HPI ? ?  ? ?Home Medications ?Prior to Admission medications   ?Medication Sig Start Date End Date Taking? Authorizing Provider  ?acetaminophen (TYLENOL) 500 MG tablet Take 1,000 mg by mouth every 6 (six) hours as needed for moderate pain.    [provider]  ?aspirin EC 81 MG tablet Take 1 tablet (81 mg total) by mouth daily. 06/21/14   Nahser, Wonda Cheng, MD  ?brimonidine (ALPHAGAN) 0.2 % ophthalmic solution Place 1 drop into the left eye 2 (two) times daily. 12/19/20   [provider]  ?clopidogrel (PLAVIX) 75 MG tablet Take 1 tablet (75 mg total) by mouth daily with breakfast. 02/15/21   Sande Rives E, PA-C  ?dorzolamide-timolol (COSOPT) 22.3-6.8 MG/ML ophthalmic solution Place 1 drop into both eyes 2 (two) times daily.    [provider]  ?ezetimibe (ZETIA) 10 MG tablet TAKE 1 TABLET(10 MG) BY MOUTH DAILY ?Patient taking differently: Take 10 mg by  mouth at bedtime. 04/16/19   Nahser, Wonda Cheng, MD  ?latanoprost (XALATAN) 0.005 % ophthalmic solution Place 1 drop into both eyes at bedtime.  05/27/13   [provider]  ?nitroGLYCERIN (NITROSTAT) 0.4 MG SL tablet Place 1 tablet (0.4 mg total) under the tongue every 5 (five) minutes as needed for chest pain. 02/15/21 02/15/22  Darreld Mclean, PA-C  ?oxycodone (OXY-IR) 5 MG capsule Take 1 capsule (5 mg total) by mouth every 4 (four) hours as needed. 04/19/21   Lucas Mallow, MD  ?rosuvastatin (CRESTOR) 40 MG tablet Take 40 mg by mouth at bedtime. 06/19/19   [provider]  ?SODIUM FLUORIDE 5000 PPM 1.1 % PSTE SMARTSIG:sparingly By Mouth Twice Daily 05/17/21   [provider]  ?triamcinolone cream (KENALOG) 0.1 % Apply 1 application topically 2 (two) times daily as needed (eczema).    [provider]  ?   ? ?Allergies    ?Patient has no known allergies.   ? ?Review of Systems   ?Review of Systems  ?Constitutional:  Positive for appetite change and fever.  ?HENT:  Positive for congestion. Negative for sore throat.   ?Respiratory:  Positive for cough and shortness of breath.   ?Cardiovascular:  Negative for chest pain, palpitations and leg swelling.  ?Gastrointestinal:  Positive for diarrhea. Negative for abdominal pain, nausea and vomiting.  ?Genitourinary:  Negative for dysuria.  ?Neurological:  Negative for syncope.  ?All other systems  reviewed and are negative. ? ?Physical Exam ?Updated Vital Signs ?BP (!) 147/66 (BP Location: Left Arm)   Pulse 89   Temp 98.3 ?F (36.8 ?C) (Oral)   Resp 20   Ht '5\' 9"'$  (1.753 m)   Wt 70.8 kg   SpO2 95%   BMI 23.04 kg/m?  ?Physical Exam ?Vitals and nursing note reviewed.  ?Constitutional:   ?   General: He is not in acute distress. ?   Appearance: Normal appearance. He is ill-appearing. He is not toxic-appearing.  ?HENT:  ?   Head: Normocephalic and atraumatic.  ?   Nose: Congestion present.  ?   Mouth/Throat:  ?   Mouth: Mucous membranes  are moist.  ?   Pharynx: Oropharynx is clear.  ?Eyes:  ?   General: No scleral icterus. ?   Extraocular Movements: Extraocular movements intact.  ?   Pupils: Pupils are equal, round, and reactive to light.  ?Cardiovascular:  ?   Rate and Rhythm: Normal rate and regular rhythm.  ?   Pulses: Normal pulses.  ?   Heart sounds: No murmur heard. ?Pulmonary:  ?   Effort: Tachypnea present. No respiratory distress.  ?   Breath sounds: Examination of the right-lower field reveals rales. Examination of the left-lower field reveals rales. Rales present. No wheezing or rhonchi.  ?Chest:  ?   Chest wall: No tenderness.  ?Abdominal:  ?   General: Bowel sounds are normal. There is no distension.  ?   Palpations: Abdomen is soft.  ?   Tenderness: There is no abdominal tenderness.  ?Musculoskeletal:     ?   General: Normal range of motion.  ?   Cervical back: Neck supple.  ?Skin: ?   General: Skin is warm and dry.  ?   Capillary Refill: Capillary refill takes less than 2 seconds.  ?Neurological:  ?   General: No focal deficit present.  ?   Mental Status: He is alert and oriented to person, place, and time. Mental status is at baseline.  ?Psychiatric:     ?   Mood and Affect: Mood normal.     ?   Behavior: Behavior normal.     ?   Thought Content: Thought content normal.     ?   Judgment: Judgment normal.  ? ? ?ED Results / Procedures / Treatments   ?Labs ?(all labs ordered are listed, but only abnormal results are displayed) ?Labs Reviewed  ?COMPREHENSIVE METABOLIC PANEL - Abnormal; Notable for the following components:  ?    Result Value  ? Sodium 120 (*)   ? Chloride 89 (*)   ? Glucose, Bld 128 (*)   ? Calcium 7.9 (*)   ? Albumin 3.0 (*)   ? AST 96 (*)   ? ALT 104 (*)   ? All other components within normal limits  ?CBC WITH DIFFERENTIAL/PLATELET - Abnormal; Notable for the following components:  ? WBC 11.1 (*)   ? RBC 3.69 (*)   ? Hemoglobin 11.1 (*)   ? HCT 31.4 (*)   ? Neutro Abs 8.8 (*)   ? Monocytes Absolute 1.4 (*)   ? All  other components within normal limits  ?RESP PANEL BY RT-PCR (FLU A&B, COVID) [id number]  ?CULTURE, BLOOD (ROUTINE X 2)  ?CULTURE, BLOOD (ROUTINE X 2)  ?URINALYSIS, ROUTINE W REFLEX MICROSCOPIC  ?LACTIC ACID, PLASMA  ?LACTIC ACID, PLASMA  ? ?EKG ?None ? ?Radiology ?No results found. ? ?Procedures ?Procedures  ? ?Medications Ordered in ED ?Medications -  No data to display ? ?ED Course/ Medical Decision Making/ A&P ?  ?                        ?Medical Decision Making ?Amount and/or Complexity of Data Reviewed ?Labs: ordered. ?Radiology: ordered. ? ?71 year old male who presents to the emergency department for cough and fever. ?His HPI and physical exam are concerning for pneumonia. ?Initial labs showed leukocytosis to 11.1 ?Notably his sodium returned at 120.  Unclear etiology.  May be from decreased p.o. intake over the past week and increasing fluid intake in comparison to his sodium.  He is alert and oriented. ?Preliminary review of his chest x-ray appears to show pneumonia.  We have already ordered a COVID and flu swab.  Ordering empiric antibiotic Coverage. ?Given his electrolyte abnormalities including transaminitis, which is new, ordering blood cultures as well as lactic. ? ?Care of patient is being handed off to [facility], PA-C at this time.  Currently patient is on room air with tachypnea without respiratory distress.  He will require admission once more labs and imaging returns for pneumonia. ?I also started 500 mL of IV fluid prior to his sodium coming back.  He may require more fluid resuscitation, given his cardiac history we will go slow with this.  Also will appreciate recommendations from hospitalist on fluid resuscitation in the setting of his hyponatremia. ? ?Ultimate disposition, final diagnosis pending completed work-up. ?Final Clinical Impression(s) / ED Diagnoses ?Final diagnoses:  ?None  ? ? ?Rx / DC Orders ?ED Discharge Orders   ? ? None  ? ?  ? ? ?  ?Mickie Hillier, PA-C ?06/18/21 1549 ? ?   ?Jeanell Sparrow, DO ?06/18/21 1849 ? ?  ?Jeanell Sparrow, DO ?06/18/21 1855 ? ?

## 2021-06-18 NOTE — H&P (Signed)
?History and Physical  ? ? ?MARQUON ALCALA TOI:[id number] DOB: 1950-11-20 DOA: 06/18/2021 ? ?PCP: Kristen Loader, FNP  ?Patient coming from: Home ? ?I have personally briefly reviewed patient's old medical records in [place] ? ?Chief Complaint: Cough/shortness of breath ? ?HPI: Roberto Ponce is a 70 y.o. male with medical history significant of CAD, MI s/p CABG, HLD, recent testicular seminoma s/p orchectomy presented to ED with a complaint of cough/shortness of breath.  Currently patient, he initially started having some cough and shortness of breath on Monday.  He then started having some low-grade subjective fever with highest temperature noted 99.4.  For those symptoms, he went to urgent care on Thursday and was told that he had viral pneumonia and was sent home without antibiotics.  Patient continued to get worse with worsening of the symptoms so he decided to come to the emergency department today.  Patient also complains of associated chills, sweating, body aches but does not produce any sputum.  No sick contacts.  No recent travel. ? ?ED Course: Upon arrival to ED, patient was hemodynamically stable.  Chest x-ray shows infiltrates.  Sodium 120.  Lactic acid normal.  Mild leukocytosis.  Patient was diagnosed with pneumonia, given 1 L of IV fluid, Rocephin and Zithromax and hospitalist service were consulted to admit for admission. ? ?Review of Systems: As per HPI otherwise negative.  ? ? ?Past Medical History:  ?Diagnosis Date  ? Asthma   ? Coronary artery disease   ? Glaucoma   ? History of gallstones   ? Hyperlipidemia   ? LVH (left ventricular hypertrophy)   ? MI (myocardial infarction) ([place])   ? MI, old   ? Mild aortic sclerosis   ? Mitral regurgitation   ? Mixed dyslipidemia   ? Venous stasis   ? ? ?Past Surgical History:  ?Procedure Laterality Date  ? CARDIAC CATHETERIZATION  09/09/1986  ? EF 56%  ? CARDIOVASCULAR STRESS TEST  12/16/2007  ? EF 54% NO ISCHEMIA  ? CHOLECYSTECTOMY    ?  CORONARY ARTERY BYPASS GRAFT    ? LIMA TO THE LAD AND RIMA TO RIGHT CORONARY ARTERY  ? CORONARY STENT INTERVENTION N/A 02/14/2021  ? Procedure: CORONARY STENT INTERVENTION;  Surgeon: Sherren Mocha, MD;  Location: [facility];  Service: Cardiovascular;  Laterality: N/A;  ? ORCHIECTOMY Right 04/19/2021  ? Procedure: RIGHT RADICAL INGUINAL ORCHIECTOMY;  Surgeon: Lucas Mallow, MD;  Location: [facility];  Service: Urology;  Laterality: Right;  1 HR FOR CASE  ? RIGHT/LEFT HEART CATH AND CORONARY/GRAFT ANGIOGRAPHY N/A 02/14/2021  ? Procedure: RIGHT/LEFT HEART CATH AND CORONARY/GRAFT ANGIOGRAPHY;  Surgeon: Sherren Mocha, MD;  Location: [facility];  Service: Cardiovascular;  Laterality: N/A;  ? TEE WITHOUT CARDIOVERSION N/A 09/02/2020  ? Procedure: TRANSESOPHAGEAL ECHOCARDIOGRAM (TEE);  Surgeon: Werner Lean, MD;  Location: [facility];  Service: Cardiovascular;  Laterality: N/A;  ? US ECHOCARDIOGRAPHY  12/12/2007  ? EF 55-60%  ? VASECTOMY  1988  ? ? ? reports that he has never smoked. He has never used smokeless tobacco. He reports that he does not drink alcohol and does not use drugs. ? ?No Known Allergies ? ?Family History  ?Problem Relation Age of Onset  ? Heart attack Father   ? Stroke Father   ? ? ?Prior to Admission medications   ?Medication Sig Start Date End Date Taking? Authorizing Provider  ?acetaminophen (TYLENOL) 500 MG tablet Take 1,000 mg by mouth every 6 (six) hours  as needed for moderate pain.    [provider]  ?aspirin EC 81 MG tablet Take 1 tablet (81 mg total) by mouth daily. 06/21/14   Nahser, Wonda Cheng, MD  ?brimonidine (ALPHAGAN) 0.2 % ophthalmic solution Place 1 drop into the left eye 2 (two) times daily. 12/19/20   [provider]  ?clopidogrel (PLAVIX) 75 MG tablet Take 1 tablet (75 mg total) by mouth daily with breakfast. 02/15/21   Sande Rives E, PA-C  ?dorzolamide-timolol (COSOPT) 22.3-6.8 MG/ML ophthalmic solution Place 1 drop into both eyes 2  (two) times daily.    [provider]  ?ezetimibe (ZETIA) 10 MG tablet TAKE 1 TABLET(10 MG) BY MOUTH DAILY ?Patient taking differently: Take 10 mg by mouth at bedtime. 04/16/19   Nahser, Wonda Cheng, MD  ?latanoprost (XALATAN) 0.005 % ophthalmic solution Place 1 drop into both eyes at bedtime.  05/27/13   [provider]  ?nitroGLYCERIN (NITROSTAT) 0.4 MG SL tablet Place 1 tablet (0.4 mg total) under the tongue every 5 (five) minutes as needed for chest pain. 02/15/21 02/15/22  Darreld Mclean, PA-C  ?oxycodone (OXY-IR) 5 MG capsule Take 1 capsule (5 mg total) by mouth every 4 (four) hours as needed. 04/19/21   Lucas Mallow, MD  ?rosuvastatin (CRESTOR) 40 MG tablet Take 40 mg by mouth at bedtime. 06/19/19   [provider]  ?SODIUM FLUORIDE 5000 PPM 1.1 % PSTE SMARTSIG:sparingly By Mouth Twice Daily 05/17/21   [provider]  ?triamcinolone cream (KENALOG) 0.1 % Apply 1 application topically 2 (two) times daily as needed (eczema).    [provider]  ? ? ?Physical Exam: ?Vitals:  ? 06/18/21 1445 06/18/21 1500 06/18/21 1530 06/18/21 1630  ?BP: 117/63 119/82 111/60 95/77  ?Pulse: 86 85 84 86  ?Resp: (!) 22 18 (!) 28 18  ?Temp:      ?TempSrc:      ?SpO2: 96% 93% 96% 96%  ?Weight:      ?Height:      ? ? ?Constitutional: NAD, calm, comfortable ?Vitals:  ? 06/18/21 1445 06/18/21 1500 06/18/21 1530 06/18/21 1630  ?BP: 117/63 119/82 111/60 95/77  ?Pulse: 86 85 84 86  ?Resp: (!) 22 18 (!) 28 18  ?Temp:      ?TempSrc:      ?SpO2: 96% 93% 96% 96%  ?Weight:      ?Height:      ? ?Eyes: PERRL, lids and conjunctivae normal ?ENMT: Mucous membranes are moist. Posterior pharynx clear of any exudate or lesions.Normal dentition.  ?Neck: normal, supple, no masses, no thyromegaly ?Respiratory: Bilateral rhonchi.  No wheezes or crackles. ?Cardiovascular: Regular rate and rhythm, no murmurs / rubs / gallops. No extremity edema. 2+ pedal pulses. No carotid bruits.  ?Abdomen: no tenderness, no  masses palpated. No hepatosplenomegaly. Bowel sounds positive.  ?Musculoskeletal: no clubbing / cyanosis. No joint deformity upper and lower extremities. Good ROM, no contractures. Normal muscle tone.  ?Skin: no rashes, lesions, ulcers. No induration ?Neurologic: CN 2-12 grossly intact. Sensation intact, DTR normal. Strength 5/5 in all 4.  ?Psychiatric: Normal judgment and insight. Alert and oriented x 3. Normal mood.  ? ? ?Labs on Admission: I have personally reviewed following labs and imaging studies ? ?CBC: ?Recent Labs  ?Lab 06/18/21 ?1457  ?WBC 11.1*  ?NEUTROABS 8.8*  ?HGB 11.1*  ?HCT 31.4*  ?MCV 85.1  ?PLT 166  ? ?Basic Metabolic Panel: ?Recent Labs  ?Lab 06/18/21 ?1457  ?NA 120*  ?K 3.7  ?CL 89*  ?  CO2 23  ?GLUCOSE 128*  ?BUN 8  ?CREATININE 0.63  ?CALCIUM 7.9*  ? ?GFR: ?Estimated Creatinine Clearance: 85.9 mL/min (by C-G formula based on SCr of 0.63 mg/dL). ?Liver Function Tests: ?Recent Labs  ?Lab 06/18/21 ?1457  ?AST 96*  ?ALT 104*  ?ALKPHOS 61  ?BILITOT 0.5  ?PROT 6.5  ?ALBUMIN 3.0*  ? ?No results for input(s): LIPASE, AMYLASE in the last 168 hours. ?No results for input(s): AMMONIA in the last 168 hours. ?Coagulation Profile: ?No results for input(s): INR, PROTIME in the last 168 hours. ?Cardiac Enzymes: ?No results for input(s): CKTOTAL, CKMB, CKMBINDEX, TROPONINI in the last 168 hours. ?BNP (last 3 results) ?No results for input(s): PROBNP in the last 8760 hours. ?HbA1C: ?No results for input(s): HGBA1C in the last 72 hours. ?CBG: ?No results for input(s): GLUCAP in the last 168 hours. ?Lipid Profile: ?No results for input(s): CHOL, HDL, LDLCALC, TRIG, CHOLHDL, LDLDIRECT in the last 72 hours. ?Thyroid Function Tests: ?No results for input(s): TSH, T4TOTAL, FREET4, T3FREE, THYROIDAB in the last 72 hours. ?Anemia Panel: ?No results for input(s): VITAMINB12, FOLATE, FERRITIN, TIBC, IRON, RETICCTPCT in the last 72 hours. ?Urine analysis: ?No results found for: COLORURINE, APPEARANCEUR, [place], [place],  [place], [place], [place], KETONESUR, PROTEINUR, [place], NITRITE, LEUKOCYTESUR ? ?Radiological Exams on Admission: ?DG Chest 2 View ? ?Result Date: 06/18/2021 ?CLINICAL DATA:  Shortness of breath.

## 2021-06-18 NOTE — Plan of Care (Signed)
°  Problem: Activity: °Goal: Ability to tolerate increased activity will improve °Outcome: Progressing °  °Problem: Respiratory: °Goal: Ability to maintain adequate ventilation will improve °Outcome: Progressing °Goal: Ability to maintain a clear airway will improve °Outcome: Progressing °  °

## 2021-06-18 NOTE — ED Triage Notes (Signed)
Pt presents w/ fever, chills, shob, cough, generalized malaise along w/ body aches. Pt dx w/ viral infection by PCP however feels as if he is getting worse.  ?

## 2021-06-18 NOTE — ED Provider Notes (Signed)
Pt's care asumed at 3:30 pm.  Pt's chest xray shows pneumonia.  I ordered rocephin and zithromax.  Pt has a sodium of 120.   ?Pt reexamined.  02 is 96. ?I spoke with hospitalist who will admit  ?  ?Fransico Meadow, [place] ?06/18/21 1704 ? ?  ?Lianne Cure, DO ?54/23/70 2109 ? ?

## 2021-06-19 ENCOUNTER — Inpatient Hospital Stay (HOSPITAL_COMMUNITY): Payer: PPO

## 2021-06-19 DIAGNOSIS — Z9049 Acquired absence of other specified parts of digestive tract: Secondary | ICD-10-CM | POA: Diagnosis not present

## 2021-06-19 DIAGNOSIS — Z79899 Other long term (current) drug therapy: Secondary | ICD-10-CM | POA: Diagnosis not present

## 2021-06-19 DIAGNOSIS — E7849 Other hyperlipidemia: Secondary | ICD-10-CM | POA: Diagnosis not present

## 2021-06-19 DIAGNOSIS — I248 Other forms of acute ischemic heart disease: Secondary | ICD-10-CM | POA: Diagnosis not present

## 2021-06-19 DIAGNOSIS — J9601 Acute respiratory failure with hypoxia: Secondary | ICD-10-CM | POA: Diagnosis not present

## 2021-06-19 DIAGNOSIS — I878 Other specified disorders of veins: Secondary | ICD-10-CM | POA: Diagnosis present

## 2021-06-19 DIAGNOSIS — E871 Hypo-osmolality and hyponatremia: Secondary | ICD-10-CM | POA: Diagnosis not present

## 2021-06-19 DIAGNOSIS — Z9079 Acquired absence of other genital organ(s): Secondary | ICD-10-CM | POA: Diagnosis not present

## 2021-06-19 DIAGNOSIS — R570 Cardiogenic shock: Secondary | ICD-10-CM | POA: Diagnosis not present

## 2021-06-19 DIAGNOSIS — I5023 Acute on chronic systolic (congestive) heart failure: Secondary | ICD-10-CM | POA: Diagnosis not present

## 2021-06-19 DIAGNOSIS — E782 Mixed hyperlipidemia: Secondary | ICD-10-CM | POA: Diagnosis not present

## 2021-06-19 DIAGNOSIS — I340 Nonrheumatic mitral (valve) insufficiency: Secondary | ICD-10-CM | POA: Diagnosis not present

## 2021-06-19 DIAGNOSIS — Z8547 Personal history of malignant neoplasm of testis: Secondary | ICD-10-CM | POA: Diagnosis not present

## 2021-06-19 DIAGNOSIS — Z4682 Encounter for fitting and adjustment of non-vascular catheter: Secondary | ICD-10-CM | POA: Diagnosis not present

## 2021-06-19 DIAGNOSIS — I25810 Atherosclerosis of coronary artery bypass graft(s) without angina pectoris: Secondary | ICD-10-CM | POA: Diagnosis present

## 2021-06-19 DIAGNOSIS — R0602 Shortness of breath: Secondary | ICD-10-CM | POA: Diagnosis present

## 2021-06-19 DIAGNOSIS — R739 Hyperglycemia, unspecified: Secondary | ICD-10-CM | POA: Diagnosis present

## 2021-06-19 DIAGNOSIS — Z955 Presence of coronary angioplasty implant and graft: Secondary | ICD-10-CM | POA: Diagnosis not present

## 2021-06-19 DIAGNOSIS — Z7902 Long term (current) use of antithrombotics/antiplatelets: Secondary | ICD-10-CM | POA: Diagnosis not present

## 2021-06-19 DIAGNOSIS — R7989 Other specified abnormal findings of blood chemistry: Secondary | ICD-10-CM | POA: Diagnosis not present

## 2021-06-19 DIAGNOSIS — J189 Pneumonia, unspecified organism: Secondary | ICD-10-CM | POA: Diagnosis not present

## 2021-06-19 DIAGNOSIS — Z823 Family history of stroke: Secondary | ICD-10-CM | POA: Diagnosis not present

## 2021-06-19 DIAGNOSIS — I252 Old myocardial infarction: Secondary | ICD-10-CM | POA: Diagnosis not present

## 2021-06-19 DIAGNOSIS — J80 Acute respiratory distress syndrome: Secondary | ICD-10-CM | POA: Diagnosis present

## 2021-06-19 DIAGNOSIS — Z20822 Contact with and (suspected) exposure to covid-19: Secondary | ICD-10-CM | POA: Diagnosis not present

## 2021-06-19 DIAGNOSIS — D649 Anemia, unspecified: Secondary | ICD-10-CM | POA: Diagnosis present

## 2021-06-19 DIAGNOSIS — J90 Pleural effusion, not elsewhere classified: Secondary | ICD-10-CM | POA: Diagnosis not present

## 2021-06-19 DIAGNOSIS — R059 Cough, unspecified: Secondary | ICD-10-CM | POA: Diagnosis not present

## 2021-06-19 DIAGNOSIS — Z452 Encounter for adjustment and management of vascular access device: Secondary | ICD-10-CM | POA: Diagnosis not present

## 2021-06-19 DIAGNOSIS — J811 Chronic pulmonary edema: Secondary | ICD-10-CM | POA: Diagnosis not present

## 2021-06-19 DIAGNOSIS — E222 Syndrome of inappropriate secretion of antidiuretic hormone: Secondary | ICD-10-CM | POA: Diagnosis not present

## 2021-06-19 DIAGNOSIS — R846 Abnormal cytological findings in specimens from respiratory organs and thorax: Secondary | ICD-10-CM | POA: Diagnosis not present

## 2021-06-19 DIAGNOSIS — Z8249 Family history of ischemic heart disease and other diseases of the circulatory system: Secondary | ICD-10-CM | POA: Diagnosis not present

## 2021-06-19 DIAGNOSIS — R945 Abnormal results of liver function studies: Secondary | ICD-10-CM | POA: Diagnosis not present

## 2021-06-19 DIAGNOSIS — I050 Rheumatic mitral stenosis: Secondary | ICD-10-CM | POA: Diagnosis not present

## 2021-06-19 DIAGNOSIS — Z7982 Long term (current) use of aspirin: Secondary | ICD-10-CM | POA: Diagnosis not present

## 2021-06-19 DIAGNOSIS — I2510 Atherosclerotic heart disease of native coronary artery without angina pectoris: Secondary | ICD-10-CM | POA: Diagnosis present

## 2021-06-19 DIAGNOSIS — Z9889 Other specified postprocedural states: Secondary | ICD-10-CM | POA: Diagnosis not present

## 2021-06-19 DIAGNOSIS — R918 Other nonspecific abnormal finding of lung field: Secondary | ICD-10-CM | POA: Diagnosis not present

## 2021-06-19 DIAGNOSIS — T380X5A Adverse effect of glucocorticoids and synthetic analogues, initial encounter: Secondary | ICD-10-CM | POA: Diagnosis present

## 2021-06-19 DIAGNOSIS — D72829 Elevated white blood cell count, unspecified: Secondary | ICD-10-CM | POA: Diagnosis not present

## 2021-06-19 DIAGNOSIS — I5021 Acute systolic (congestive) heart failure: Secondary | ICD-10-CM | POA: Diagnosis not present

## 2021-06-19 DIAGNOSIS — I700 Atherosclerosis of aorta: Secondary | ICD-10-CM | POA: Diagnosis not present

## 2021-06-19 DIAGNOSIS — I952 Hypotension due to drugs: Secondary | ICD-10-CM | POA: Diagnosis not present

## 2021-06-19 DIAGNOSIS — R609 Edema, unspecified: Secondary | ICD-10-CM | POA: Diagnosis not present

## 2021-06-19 LAB — BASIC METABOLIC PANEL
Anion gap: 6 (ref 5–15)
BUN: 7 mg/dL — ABNORMAL LOW (ref 8–23)
CO2: 24 mmol/L (ref 22–32)
Calcium: 7.8 mg/dL — ABNORMAL LOW (ref 8.9–10.3)
Chloride: 91 mmol/L — ABNORMAL LOW (ref 98–111)
Creatinine, Ser: 0.63 mg/dL (ref 0.61–1.24)
GFR, Estimated: >60 mL/min (ref >60)
Glucose, Bld: 103 mg/dL — ABNORMAL HIGH (ref 70–99)
Potassium: 3.5 mmol/L (ref 3.5–5.1)
Sodium: 121 mmol/L — ABNORMAL LOW (ref 135–145)

## 2021-06-19 LAB — EXPECTORATED SPUTUM ASSESSMENT W GRAM STAIN, RFLX TO RESP C

## 2021-06-19 LAB — CBC
HCT: 28.6 % — ABNORMAL LOW (ref 39.0–52.0)
Hemoglobin: 9.8 g/dL — ABNORMAL LOW (ref 13.0–17.0)
MCH: 29.7 pg (ref 26.0–34.0)
MCHC: 34.3 g/dL (ref 30.0–36.0)
MCV: 86.7 fL (ref 80.0–100.0)
Platelets: 149 10*3/uL — ABNORMAL LOW (ref 150–400)
RBC: 3.3 MIL/uL — ABNORMAL LOW (ref 4.22–5.81)
RDW: 13.8 % (ref 11.5–15.5)
WBC: 10.6 10*3/uL — ABNORMAL HIGH (ref 4.0–10.5)
nRBC: 0 % (ref 0.0–0.2)

## 2021-06-19 LAB — C DIFFICILE QUICK SCREEN W PCR REFLEX
C Diff antigen: NEGATIVE
C Diff interpretation: NOT DETECTED
C Diff toxin: NEGATIVE

## 2021-06-19 LAB — SODIUM, URINE, RANDOM: Sodium, Ur: 48 mmol/L

## 2021-06-19 LAB — OSMOLALITY: Osmolality: 248 mOsm/kg — CL (ref 275–295)

## 2021-06-19 LAB — SODIUM
Sodium: 119 mmol/L — CL (ref 135–145)
Sodium: 119 mmol/L — CL (ref 135–145)

## 2021-06-19 LAB — OSMOLALITY, URINE: Osmolality, Ur: 605 mOsm/kg (ref 300–900)

## 2021-06-19 LAB — STREP PNEUMONIAE URINARY ANTIGEN: Strep Pneumo Urinary Antigen: NEGATIVE

## 2021-06-19 MED ORDER — SODIUM CHLORIDE 1 G PO TABS
1.0000 g | ORAL_TABLET | Freq: Two times a day (BID) | ORAL | Status: DC
  Administered 2021-06-19 – 2021-06-21 (×3): 1 g via ORAL
  Filled 2021-06-19 (×4): qty 1

## 2021-06-19 NOTE — Progress Notes (Signed)
?  Transition of Care (TOC) Screening Note ? ? ?Patient Details  ?Name: Roberto Ponce ?Date of Birth: [birth year] ? ? ?Transition of Care (TOC) CM/SW Contact:    ?Abbigayle Toole, LCSW ?Phone Number: ?06/19/2021, 11:35 AM ? ? ? ?Transition of Care Department [facility]) has reviewed patient and no TOC needs have been identified at this time. We will continue to monitor patient advancement through interdisciplinary progression rounds. If new patient transition needs arise, please place a TOC consult. ? ? ?

## 2021-06-19 NOTE — Progress Notes (Signed)
?PROGRESS NOTE ? ? ? ?ABDALLAH HERN  XVQ:[id number] DOB: [birth year] DOA: 06/18/2021 ?PCP: Kristen Loader, FNP ? ? ?Brief Narrative:  ?Roberto Ponce is a 71 y.o. male with medical history significant of CAD, MI s/p CABG, HLD, recent testicular seminoma s/p orchectomy presented to ED with a complaint of cough/shortness of breath.  Admitted under hospitalist service with community-acquired pneumonia. ? ?Assessment & Plan: ?  ?Principal Problem: ?  CAP (community acquired pneumonia) ?Active Problems: ?  CAD (coronary artery disease) of artery bypass graft ?  Acute hyponatremia ? ? ?Community-acquired pneumonia: Chest x-ray indicates pneumonia.  I will continue Rocephin and Zithromax.  Blood cultures negative so far.  Sputum culture pending.  He is afebrile.  Leukocytosis is stable.  Procalcitonin very low.  Urine antigen for Legionella and mystical cocci pending.  He is feeling better than yesterday.  Continue bronchodilators.  Respiratory viral panel negative.  We will stop IV fluids. ?  ?Acute hyponatremia: 120 upon presentation at 131 now.  Lab work is pending but I suspect SIADH in the setting of pneumonia.  We will stop IV fluids.  Monitor sodium. ?  ?History of CAD and MI s/p CABG and recent stent December 2022: No chest pain.  Continue DAPT and rest of the medications. ? ?Generalized weakness: Consult PT OT. ?  ?Hyperlipidemia: Resume home medications. ? ?DVT prophylaxis: enoxaparin (LOVENOX) injection 40 mg Start: 06/18/21 1800 ?  Code Status: Full Code  ?Family Communication: Wife present at bedside.  Plan of care discussed with patient in length and he/she verbalized understanding and agreed with it. ? ?Status is: Observation ?The patient will require care spanning > 2 midnights and should be moved to inpatient because: Hyponatremia and he is still symptomatic. ? ? ?Estimated body mass index is 26.49 kg/m? as calculated from the following: ?  Height as of this encounter: '5\' 9"'$  (1.753 m). ?  Weight  as of this encounter: 81.4 kg. ? ?  ?Nutritional Assessment: ?Body mass index is 26.49 kg/m?Marland KitchenMarland Kitchen ?Seen by dietician.  I agree with the assessment and plan as outlined below: ?Nutrition Status: ?  ?  ?  ? ?. ?Skin Assessment: ?I have examined the patient's skin and I agree with the wound assessment as performed by the wound care RN as outlined below: ?  ? ?Consultants:  ?None ? ?Procedures:  ?None ? ?Antimicrobials:  ?Anti-infectives (From admission, onward)  ? ? Start     Dose/Rate Route Frequency Ordered Stop  ? 06/19/21 1600  cefTRIAXone (ROCEPHIN) 2 g in sodium chloride 0.9 % 100 mL IVPB       ? 2 g ?200 mL/hr over 30 Minutes Intravenous Every 24 hours 06/18/21 1708 06/24/21 1559  ? 06/19/21 0000  azithromycin (ZITHROMAX) 500 mg in sodium chloride 0.9 % 250 mL IVPB       ? 500 mg ?250 mL/hr over 60 Minutes Intravenous Every 24 hours 06/18/21 1708 06/23/21 2359  ? 06/18/21 1730  cefTRIAXone (ROCEPHIN) 1 g in sodium chloride 0.9 % 100 mL IVPB       ? 1 g ?200 mL/hr over 30 Minutes Intravenous NOW 06/18/21 1718 06/18/21 2222  ? 06/18/21 1545  cefTRIAXone (ROCEPHIN) 1 g in sodium chloride 0.9 % 100 mL IVPB       ? 1 g ?200 mL/hr over 30 Minutes Intravenous  Once 06/18/21 1535 06/18/21 1634  ? 06/18/21 1545  azithromycin (ZITHROMAX) 500 mg in sodium chloride 0.9 % 250 mL IVPB       ?  500 mg ?250 mL/hr over 60 Minutes Intravenous  Once 06/18/21 1535 06/18/21 1746  ? ?  ?  ? ? ?Subjective: ?Patient seen and examined.  Wife at the bedside.  He states that his breathing is slightly better than yesterday but he feels weak.  No other complaint. ? ?Objective: ?Vitals:  ? 06/18/21 2205 06/19/21 0245 06/19/21 0500 06/19/21 6269  ?BP: (!) 113/59 (!) 106/56  106/63  ?Pulse: 81 72  71  ?Resp: '18 18  18  '$ ?Temp: 98.3 ?F (36.8 ?C) 98.8 ?F (37.1 ?C)  98.2 ?F (36.8 ?C)  ?TempSrc: Oral Oral  Oral  ?SpO2: 94% 93%  98%  ?Weight:   81.4 kg   ?Height:      ? ? ?Intake/Output Summary (Last 24 hours) at 06/19/2021 0954 ?Last data filed at  06/19/2021 4854 ?Gross per 24 hour  ?Intake 2579.57 ml  ?Output --  ?Net 2579.57 ml  ? ?Filed Weights  ? 06/18/21 1359 06/19/21 0500  ?Weight: 70.8 kg 81.4 kg  ? ? ?Examination: ? ?General exam: Appears calm and comfortable  ?Respiratory system: Diffuse bilateral rhonchi.  No wheezes. ?Cardiovascular system: S1 & S2 heard, RRR. No JVD, murmurs, rubs, gallops or clicks. No pedal edema. ?Gastrointestinal system: Abdomen is nondistended, soft and nontender. No organomegaly or masses felt. Normal bowel sounds heard. ?Central nervous system: Alert and oriented. No focal neurological deficits. ?Extremities: Symmetric 5 x 5 power. ?Skin: No rashes, lesions or ulcers ?Psychiatry: Judgement and insight appear normal. Mood & affect appropriate.  ? ? ?Data Reviewed: I have personally reviewed following labs and imaging studies ? ?CBC: ?Recent Labs  ?Lab 06/18/21 ?1457 06/18/21 ?1735 06/19/21 ?0429  ?WBC 11.1* 11.1* 10.6*  ?NEUTROABS 8.8*  --   --   ?HGB 11.1* 10.6* 9.8*  ?HCT 31.4* 29.9* 28.6*  ?MCV 85.1 85.9 86.7  ?PLT 166 159 149*  ? ?Basic Metabolic Panel: ?Recent Labs  ?Lab 06/18/21 ?1457 06/18/21 ?1735 06/19/21 ?0429  ?NA 120*  --  121*  ?K 3.7  --  3.5  ?CL 89*  --  91*  ?CO2 23  --  24  ?GLUCOSE 128*  --  103*  ?BUN 8  --  7*  ?CREATININE 0.63 0.56* 0.63  ?CALCIUM 7.9*  --  7.8*  ?MG  --  1.6*  --   ? ?GFR: ?Estimated Creatinine Clearance: 85.9 mL/min (by C-G formula based on SCr of 0.63 mg/dL). ?Liver Function Tests: ?Recent Labs  ?Lab 06/18/21 ?1457  ?AST 96*  ?ALT 104*  ?ALKPHOS 61  ?BILITOT 0.5  ?PROT 6.5  ?ALBUMIN 3.0*  ? ?No results for input(s): LIPASE, AMYLASE in the last 168 hours. ?No results for input(s): AMMONIA in the last 168 hours. ?Coagulation Profile: ?No results for input(s): INR, PROTIME in the last 168 hours. ?Cardiac Enzymes: ?No results for input(s): CKTOTAL, CKMB, CKMBINDEX, TROPONINI in the last 168 hours. ?BNP (last 3 results) ?No results for input(s): PROBNP in the last 8760 hours. ?HbA1C: ?No  results for input(s): HGBA1C in the last 72 hours. ?CBG: ?No results for input(s): GLUCAP in the last 168 hours. ?Lipid Profile: ?No results for input(s): CHOL, HDL, LDLCALC, TRIG, CHOLHDL, LDLDIRECT in the last 72 hours. ?Thyroid Function Tests: ?No results for input(s): TSH, T4TOTAL, FREET4, T3FREE, THYROIDAB in the last 72 hours. ?Anemia Panel: ?No results for input(s): VITAMINB12, FOLATE, FERRITIN, TIBC, IRON, RETICCTPCT in the last 72 hours. ?Sepsis Labs: ?Recent Labs  ?Lab 06/18/21 ?1555 06/18/21 ?1735  ?PROCALCITON  --  <0.10  ?LATICACIDVEN 1.3 1.2  ? ? ?  Recent Results (from the past 240 hour(s))  ?Expectorated Sputum Assessment w Gram Stain, Rflx to Resp Cult     Status: None  ? Collection Time: 06/18/21 12:02 AM  ? Specimen: Sputum  ?Result Value Ref Range Status  ? Specimen Description SPUTUM  Final  ? Special Requests NONE  Final  ? Sputum evaluation   Final  ?  THIS SPECIMEN IS ACCEPTABLE FOR SPUTUM CULTURE ?Performed at [facility], [place] [address], [place], [place] [address] ?  ? Report Status 06/19/2021 FINAL  Final  ?Resp Panel by RT-PCR (Flu A&B, Covid) Nasopharyngeal Swab     Status: None  ? Collection Time: 06/18/21  2:57 PM  ? Specimen: Nasopharyngeal Swab; Nasopharyngeal(NP) swabs in vial transport medium  ?Result Value Ref Range Status  ? SARS Coronavirus 2 by RT PCR NEGATIVE NEGATIVE Final  ?  Comment: (NOTE) ?SARS-CoV-2 target nucleic acids are NOT DETECTED. ? ?The SARS-CoV-2 RNA is generally detectable in upper respiratory ?specimens during the acute phase of infection. The lowest ?concentration of SARS-CoV-2 viral copies this assay can detect is ?138 copies/mL. A negative result does not preclude SARS-Cov-2 ?infection and should not be used as the sole basis for treatment or ?other patient management decisions. A negative result may occur with  ?improper specimen collection/handling, submission of specimen other ?than nasopharyngeal swab, presence of viral mutation(s)  within the ?areas targeted by this assay, and inadequate number of viral ?copies(<138 copies/mL). A negative result must be combined with ?clinical observations, patient history, and epidemiological ?information.

## 2021-06-20 ENCOUNTER — Inpatient Hospital Stay (HOSPITAL_COMMUNITY): Payer: PPO

## 2021-06-20 ENCOUNTER — Inpatient Hospital Stay: Payer: Self-pay

## 2021-06-20 ENCOUNTER — Encounter (HOSPITAL_COMMUNITY): Payer: Self-pay | Admitting: Family Medicine

## 2021-06-20 DIAGNOSIS — J189 Pneumonia, unspecified organism: Secondary | ICD-10-CM | POA: Diagnosis not present

## 2021-06-20 DIAGNOSIS — J80 Acute respiratory distress syndrome: Secondary | ICD-10-CM | POA: Diagnosis not present

## 2021-06-20 DIAGNOSIS — I25810 Atherosclerosis of coronary artery bypass graft(s) without angina pectoris: Secondary | ICD-10-CM | POA: Diagnosis not present

## 2021-06-20 DIAGNOSIS — A419 Sepsis, unspecified organism: Secondary | ICD-10-CM | POA: Insufficient documentation

## 2021-06-20 DIAGNOSIS — I340 Nonrheumatic mitral (valve) insufficiency: Secondary | ICD-10-CM

## 2021-06-20 DIAGNOSIS — R6520 Severe sepsis without septic shock: Secondary | ICD-10-CM | POA: Insufficient documentation

## 2021-06-20 DIAGNOSIS — E871 Hypo-osmolality and hyponatremia: Secondary | ICD-10-CM | POA: Diagnosis not present

## 2021-06-20 DIAGNOSIS — J9601 Acute respiratory failure with hypoxia: Secondary | ICD-10-CM | POA: Diagnosis not present

## 2021-06-20 LAB — CBC WITH DIFFERENTIAL/PLATELET
Abs Immature Granulocytes: 0.11 10*3/uL — ABNORMAL HIGH (ref 0.00–0.07)
Basophils Absolute: 0 10*3/uL (ref 0.0–0.1)
Basophils Relative: 0 %
Eosinophils Absolute: 0 10*3/uL (ref 0.0–0.5)
Eosinophils Relative: 0 %
HCT: 30.1 % — ABNORMAL LOW (ref 39.0–52.0)
Hemoglobin: 10.5 g/dL — ABNORMAL LOW (ref 13.0–17.0)
Immature Granulocytes: 1 %
Lymphocytes Relative: 7 %
Lymphs Abs: 0.9 10*3/uL (ref 0.7–4.0)
MCH: 30 pg (ref 26.0–34.0)
MCHC: 34.9 g/dL (ref 30.0–36.0)
MCV: 86 fL (ref 80.0–100.0)
Monocytes Absolute: 1 10*3/uL (ref 0.1–1.0)
Monocytes Relative: 7 %
Neutro Abs: 12.3 10*3/uL — ABNORMAL HIGH (ref 1.7–7.7)
Neutrophils Relative %: 85 %
Platelets: 191 10*3/uL (ref 150–400)
RBC: 3.5 MIL/uL — ABNORMAL LOW (ref 4.22–5.81)
RDW: 13.8 % (ref 11.5–15.5)
WBC: 14.3 10*3/uL — ABNORMAL HIGH (ref 4.0–10.5)
nRBC: 0 % (ref 0.0–0.2)

## 2021-06-20 LAB — CBC
HCT: 29.5 % — ABNORMAL LOW (ref 39.0–52.0)
Hemoglobin: 10.6 g/dL — ABNORMAL LOW (ref 13.0–17.0)
MCH: 30.4 pg (ref 26.0–34.0)
MCHC: 35.9 g/dL (ref 30.0–36.0)
MCV: 84.5 fL (ref 80.0–100.0)
Platelets: 282 10*3/uL (ref 150–400)
RBC: 3.49 MIL/uL — ABNORMAL LOW (ref 4.22–5.81)
RDW: 14.1 % (ref 11.5–15.5)
WBC: 19.2 10*3/uL — ABNORMAL HIGH (ref 4.0–10.5)
nRBC: 0 % (ref 0.0–0.2)

## 2021-06-20 LAB — BLOOD GAS, ARTERIAL
Acid-Base Excess: 0.4 mmol/L (ref 0.0–2.0)
Acid-Base Excess: 0.7 mmol/L (ref 0.0–2.0)
Acid-base deficit: 0.1 mmol/L (ref 0.0–2.0)
Bicarbonate: 23.9 mmol/L (ref 20.0–28.0)
Bicarbonate: 22.9 mmol/L (ref 20.0–28.0)
Bicarbonate: 24.3 mmol/L (ref 20.0–28.0)
Drawn by: 308601
Drawn by: 56353
FIO2: 80 %
MECHVT: 490 mL
O2 Content: 12 L/min
O2 Saturation: 97.4 %
O2 Saturation: 99.3 %
O2 Saturation: 100 %
PEEP: 10 cmH2O
Patient temperature: 38.1
Patient temperature: 37
Patient temperature: 36.4
RATE: 26 resp/min
pCO2 arterial: 38 mmHg (ref 32–48)
pCO2 arterial: 30 mmHg — ABNORMAL LOW (ref 32–48)
pCO2 arterial: 34 mmHg (ref 32–48)
pH, Arterial: 7.41 (ref 7.35–7.45)
pH, Arterial: 7.49 — ABNORMAL HIGH (ref 7.35–7.45)
pH, Arterial: 7.46 — ABNORMAL HIGH (ref 7.35–7.45)
pO2, Arterial: 76 mmHg — ABNORMAL LOW (ref 83–108)
pO2, Arterial: 195 mmHg — ABNORMAL HIGH (ref 83–108)
pO2, Arterial: 289 mmHg — ABNORMAL HIGH (ref 83–108)

## 2021-06-20 LAB — ECHOCARDIOGRAM COMPLETE
AR max vel: 2.03 cm2
AV Area VTI: 2.02 cm2
AV Area mean vel: 2.09 cm2
AV Mean grad: 3 mmHg
AV Peak grad: 6.7 mmHg
Ao pk vel: 1.29 m/s
Area-P 1/2: 3.42 cm2
Calc EF: 37.9 %
Height: 69 in
MV M vel: 5.26 m/s
MV Peak grad: 110.7 mmHg
Radius: 0.4 cm
S' Lateral: 4.2 cm
Single Plane A2C EF: 33.5 %
Single Plane A4C EF: 42.3 %
Weight: 2870.4 oz

## 2021-06-20 LAB — COMPREHENSIVE METABOLIC PANEL
ALT: 186 U/L — ABNORMAL HIGH (ref 0–44)
AST: 195 U/L — ABNORMAL HIGH (ref 15–41)
Albumin: 2.4 g/dL — ABNORMAL LOW (ref 3.5–5.0)
Alkaline Phosphatase: 82 U/L (ref 38–126)
Anion gap: 11 (ref 5–15)
BUN: 7 mg/dL — ABNORMAL LOW (ref 8–23)
CO2: 22 mmol/L (ref 22–32)
Calcium: 7.8 mg/dL — ABNORMAL LOW (ref 8.9–10.3)
Chloride: 92 mmol/L — ABNORMAL LOW (ref 98–111)
Creatinine, Ser: 0.47 mg/dL — ABNORMAL LOW (ref 0.61–1.24)
GFR, Estimated: >60 mL/min (ref >60)
Glucose, Bld: 145 mg/dL — ABNORMAL HIGH (ref 70–99)
Potassium: 3.4 mmol/L — ABNORMAL LOW (ref 3.5–5.1)
Sodium: 125 mmol/L — ABNORMAL LOW (ref 135–145)
Total Bilirubin: 1.6 mg/dL — ABNORMAL HIGH (ref 0.3–1.2)
Total Protein: 6 g/dL — ABNORMAL LOW (ref 6.5–8.1)

## 2021-06-20 LAB — BASIC METABOLIC PANEL
Anion gap: 9 (ref 5–15)
BUN: 7 mg/dL — ABNORMAL LOW (ref 8–23)
CO2: 24 mmol/L (ref 22–32)
Calcium: 7.6 mg/dL — ABNORMAL LOW (ref 8.9–10.3)
Chloride: 87 mmol/L — ABNORMAL LOW (ref 98–111)
Creatinine, Ser: 0.59 mg/dL — ABNORMAL LOW (ref 0.61–1.24)
GFR, Estimated: >60 mL/min (ref >60)
Glucose, Bld: 111 mg/dL — ABNORMAL HIGH (ref 70–99)
Potassium: 3.7 mmol/L (ref 3.5–5.1)
Sodium: 120 mmol/L — ABNORMAL LOW (ref 135–145)

## 2021-06-20 LAB — BODY FLUID CELL COUNT WITH DIFFERENTIAL
Eos, Fluid: 2 %
Lymphs, Fluid: 6 %
Monocyte-Macrophage-Serous Fluid: 60 % (ref 50–90)
Neutrophil Count, Fluid: 32 % — ABNORMAL HIGH (ref 0–25)
Total Nucleated Cell Count, Fluid: 12 cu mm (ref 0–1000)

## 2021-06-20 LAB — SODIUM: Sodium: 122 mmol/L — ABNORMAL LOW (ref 135–145)

## 2021-06-20 LAB — MAGNESIUM: Magnesium: 1.7 mg/dL (ref 1.7–2.4)

## 2021-06-20 LAB — SEDIMENTATION RATE: Sed Rate: 67 mm/hr — ABNORMAL HIGH (ref 0–16)

## 2021-06-20 LAB — MRSA NEXT GEN BY PCR, NASAL: MRSA by PCR Next Gen: NOT DETECTED

## 2021-06-20 LAB — PROCALCITONIN: Procalcitonin: 0.15 ng/mL

## 2021-06-20 LAB — LACTIC ACID, PLASMA
Lactic Acid, Venous: 1.4 mmol/L (ref 0.5–1.9)
Lactic Acid, Venous: 1.4 mmol/L (ref 0.5–1.9)

## 2021-06-20 LAB — LEGIONELLA PNEUMOPHILA SEROGP 1 UR AG: L. pneumophila Serogp 1 Ur Ag: NEGATIVE

## 2021-06-20 LAB — HEMOGLOBIN A1C
Hgb A1c MFr Bld: 5.4 % (ref 4.8–5.6)
Mean Plasma Glucose: 108.28 mg/dL

## 2021-06-20 LAB — PHOSPHORUS: Phosphorus: 3.1 mg/dL (ref 2.5–4.6)

## 2021-06-20 LAB — TROPONIN I (HIGH SENSITIVITY): Troponin I (High Sensitivity): 61 ng/L — ABNORMAL HIGH (ref <18)

## 2021-06-20 LAB — GLUCOSE, CAPILLARY: Glucose-Capillary: 188 mg/dL — ABNORMAL HIGH (ref 70–99)

## 2021-06-20 LAB — BRAIN NATRIURETIC PEPTIDE: B Natriuretic Peptide: 667.6 pg/mL — ABNORMAL HIGH (ref 0.0–100.0)

## 2021-06-20 MED ORDER — PHENYLEPHRINE 80 MCG/ML (10ML) SYRINGE FOR IV PUSH (FOR BLOOD PRESSURE SUPPORT): 80.0000 ug | PREFILLED_SYRINGE | Freq: Once | INTRAVENOUS | Status: AC | PRN

## 2021-06-20 MED ORDER — ETOMIDATE 2 MG/ML IV SOLN: 10.0000 mg | Freq: Once | INTRAVENOUS | Status: AC

## 2021-06-20 MED ORDER — ETOMIDATE 2 MG/ML IV SOLN
INTRAVENOUS | Status: AC
  Administered 2021-06-20: 10 mg via INTRAVENOUS
  Filled 2021-06-20: qty 20

## 2021-06-20 MED ORDER — SODIUM BICARBONATE 8.4 % IV SOLN
INTRAVENOUS | Status: AC
  Administered 2021-06-20: 50 meq via INTRAVENOUS
  Filled 2021-06-20: qty 50

## 2021-06-20 MED ORDER — PHENOL 1.4 % MT LIQD
1.0000 | OROMUCOSAL | Status: DC | PRN
  Administered 2021-06-20: 1 via OROMUCOSAL
  Filled 2021-06-20: qty 177

## 2021-06-20 MED ORDER — SODIUM CHLORIDE 0.9 % IV SOLN
2.0000 g | Freq: Three times a day (TID) | INTRAVENOUS | Status: DC
  Administered 2021-06-20 – 2021-06-22 (×6): 2 g via INTRAVENOUS
  Filled 2021-06-20 (×6): qty 12.5

## 2021-06-20 MED ORDER — FENTANYL CITRATE PF 50 MCG/ML IJ SOSY: 25.0000 ug | PREFILLED_SYRINGE | INTRAMUSCULAR | Status: DC | PRN

## 2021-06-20 MED ORDER — CHLORHEXIDINE GLUCONATE CLOTH 2 % EX PADS
6.0000 | MEDICATED_PAD | Freq: Every day | CUTANEOUS | Status: DC
  Administered 2021-06-20 – 2021-06-28 (×8): 6 via TOPICAL

## 2021-06-20 MED ORDER — MIDAZOLAM HCL 2 MG/2ML IJ SOLN
INTRAMUSCULAR | Status: AC
  Administered 2021-06-20: 4 mg via INTRAVENOUS
  Filled 2021-06-20: qty 4

## 2021-06-20 MED ORDER — SODIUM CHLORIDE 0.9% FLUSH: 10.0000 mL | INTRAVENOUS | Status: DC | PRN

## 2021-06-20 MED ORDER — HYDROCORTISONE SOD SUC (PF) 100 MG IJ SOLR
50.0000 mg | Freq: Four times a day (QID) | INTRAMUSCULAR | Status: AC
  Administered 2021-06-20 – 2021-06-24 (×16): 50 mg via INTRAVENOUS
  Filled 2021-06-20 (×16): qty 2

## 2021-06-20 MED ORDER — MIDAZOLAM HCL 2 MG/2ML IJ SOLN: 1.0000 mg | INTRAMUSCULAR | Status: DC | PRN

## 2021-06-20 MED ORDER — VANCOMYCIN HCL 2000 MG/400ML IV SOLN
2000.0000 mg | Freq: Once | INTRAVENOUS | Status: AC
  Administered 2021-06-20: 2000 mg via INTRAVENOUS
  Filled 2021-06-20: qty 400

## 2021-06-20 MED ORDER — FENTANYL BOLUS VIA INFUSION
25.0000 ug | INTRAVENOUS | Status: DC | PRN
  Administered 2021-06-20 (×3): 50 ug via INTRAVENOUS
  Administered 2021-06-20: 100 ug via INTRAVENOUS
  Administered 2021-06-21 (×3): 50 ug via INTRAVENOUS
  Filled 2021-06-20: qty 100

## 2021-06-20 MED ORDER — SODIUM CHLORIDE 0.9 % IV SOLN
250.0000 mL | INTRAVENOUS | Status: DC
  Administered 2021-06-20: 250 mL via INTRAVENOUS

## 2021-06-20 MED ORDER — SODIUM BICARBONATE 8.4 % IV SOLN
INTRAVENOUS | Status: AC
  Administered 2021-06-20: 50 meq
  Filled 2021-06-20: qty 100

## 2021-06-20 MED ORDER — DOCUSATE SODIUM 50 MG/5ML PO LIQD: 100.0000 mg | Freq: Two times a day (BID) | ORAL | Status: DC

## 2021-06-20 MED ORDER — FENTANYL CITRATE PF 50 MCG/ML IJ SOSY
25.0000 ug | PREFILLED_SYRINGE | Freq: Once | INTRAMUSCULAR | Status: AC
  Administered 2021-06-20: 25 ug via INTRAVENOUS

## 2021-06-20 MED ORDER — INSULIN ASPART 100 UNIT/ML IJ SOLN
0.0000 [IU] | Freq: Three times a day (TID) | INTRAMUSCULAR | Status: DC
  Administered 2021-06-21 (×2): 3 [IU] via SUBCUTANEOUS
  Administered 2021-06-21 – 2021-06-24 (×6): 2 [IU] via SUBCUTANEOUS

## 2021-06-20 MED ORDER — GUAIFENESIN 100 MG/5ML PO LIQD
5.0000 mL | ORAL | Status: DC | PRN
  Administered 2021-06-20: 5 mL via ORAL
  Filled 2021-06-20: qty 10

## 2021-06-20 MED ORDER — DEXTROSE-NACL 5-0.9 % IV SOLN
INTRAVENOUS | Status: DC
Start: 2021-06-20 — End: 2021-06-21

## 2021-06-20 MED ORDER — MAGNESIUM SULFATE 2 GM/50ML IV SOLN
2.0000 g | Freq: Once | INTRAVENOUS | Status: AC
  Administered 2021-06-20: 2 g via INTRAVENOUS
  Filled 2021-06-20: qty 50

## 2021-06-20 MED ORDER — NOREPINEPHRINE 4 MG/250ML-% IV SOLN
2.0000 ug/min | INTRAVENOUS | Status: DC
  Administered 2021-06-20: 2 ug/min via INTRAVENOUS
  Administered 2021-06-21: 6 ug/min via INTRAVENOUS
  Filled 2021-06-20: qty 250

## 2021-06-20 MED ORDER — FENTANYL CITRATE (PF) 100 MCG/2ML IJ SOLN: 100.0000 ug | Freq: Once | INTRAMUSCULAR | Status: AC

## 2021-06-20 MED ORDER — FENTANYL CITRATE (PF) 100 MCG/2ML IJ SOLN
INTRAMUSCULAR | Status: AC
  Administered 2021-06-20: 100 ug via INTRAVENOUS
  Filled 2021-06-20: qty 2

## 2021-06-20 MED ORDER — ROCURONIUM BROMIDE 10 MG/ML (PF) SYRINGE
50.0000 mg | PREFILLED_SYRINGE | Freq: Once | INTRAVENOUS | Status: AC
Start: 2021-06-20 — End: 2021-06-20

## 2021-06-20 MED ORDER — MIDAZOLAM HCL 2 MG/2ML IJ SOLN
1.0000 mg | INTRAMUSCULAR | Status: DC | PRN
  Administered 2021-06-20: 1 mg via INTRAVENOUS
  Filled 2021-06-20: qty 2

## 2021-06-20 MED ORDER — ROCURONIUM BROMIDE 10 MG/ML (PF) SYRINGE
50.0000 mg | PREFILLED_SYRINGE | Freq: Once | INTRAVENOUS | Status: AC
  Administered 2021-06-20: 50 mg via INTRAVENOUS

## 2021-06-20 MED ORDER — FENTANYL 2500MCG IN NS 250ML (10MCG/ML) PREMIX INFUSION
25.0000 ug/h | INTRAVENOUS | Status: DC
  Administered 2021-06-20: 25 ug/h via INTRAVENOUS
  Administered 2021-06-21: 100 ug/h via INTRAVENOUS
  Filled 2021-06-20 (×2): qty 250

## 2021-06-20 MED ORDER — MIDAZOLAM HCL 2 MG/2ML IJ SOLN
INTRAMUSCULAR | Status: AC
  Administered 2021-06-20: 2 mg via INTRAVENOUS
  Filled 2021-06-20: qty 2

## 2021-06-20 MED ORDER — SODIUM CHLORIDE 0.9 % IV SOLN: INTRAVENOUS | Status: DC

## 2021-06-20 MED ORDER — NOREPINEPHRINE 4 MG/250ML-% IV SOLN
INTRAVENOUS | Status: AC
  Administered 2021-06-20: 2 ug/min via INTRAVENOUS
  Filled 2021-06-20: qty 250

## 2021-06-20 MED ORDER — PROPOFOL 1000 MG/100ML IV EMUL
0.0000 ug/kg/min | INTRAVENOUS | Status: DC
  Administered 2021-06-20: 5 ug/kg/min via INTRAVENOUS
  Filled 2021-06-20 (×2): qty 100

## 2021-06-20 MED ORDER — POLYETHYLENE GLYCOL 3350 17 G PO PACK
17.0000 g | PACK | Freq: Every day | ORAL | Status: DC
  Filled 2021-06-20: qty 1

## 2021-06-20 MED ORDER — SODIUM CHLORIDE 0.9% FLUSH
10.0000 mL | Freq: Two times a day (BID) | INTRAVENOUS | Status: DC
  Administered 2021-06-21 (×2): 30 mL
  Administered 2021-06-22: 10 mL
  Administered 2021-06-22: 30 mL
  Administered 2021-06-23 (×2): 10 mL
  Administered 2021-06-24 – 2021-06-25 (×3): 30 mL
  Administered 2021-06-25 – 2021-06-28 (×5): 10 mL

## 2021-06-20 MED ORDER — SODIUM BICARBONATE 8.4 % IV SOLN: 50.0000 meq | Freq: Once | INTRAVENOUS | Status: AC

## 2021-06-20 MED ORDER — MIDAZOLAM HCL 2 MG/2ML IJ SOLN: 4.0000 mg | Freq: Once | INTRAMUSCULAR | Status: AC

## 2021-06-20 MED ORDER — VANCOMYCIN HCL 1500 MG/300ML IV SOLN
1500.0000 mg | INTRAVENOUS | Status: DC
  Filled 2021-06-20: qty 300

## 2021-06-20 MED ORDER — NOREPINEPHRINE 4 MG/250ML-% IV SOLN
INTRAVENOUS | Status: AC
  Filled 2021-06-20: qty 250

## 2021-06-20 MED ORDER — POTASSIUM CHLORIDE 20 MEQ PO PACK
40.0000 meq | PACK | Freq: Once | ORAL | Status: AC
  Administered 2021-06-20: 40 meq
  Filled 2021-06-20: qty 2

## 2021-06-20 MED ORDER — PANTOPRAZOLE SODIUM 40 MG IV SOLR
40.0000 mg | Freq: Every day | INTRAVENOUS | Status: DC
Start: 2021-06-20 — End: 2021-06-22
  Administered 2021-06-20 – 2021-06-21 (×2): 40 mg via INTRAVENOUS
  Filled 2021-06-20 (×2): qty 10

## 2021-06-20 MED ORDER — PHENYLEPHRINE 80 MCG/ML (10ML) SYRINGE FOR IV PUSH (FOR BLOOD PRESSURE SUPPORT)
PREFILLED_SYRINGE | INTRAVENOUS | Status: AC
  Administered 2021-06-20: 200 ug via INTRAVENOUS
  Filled 2021-06-20: qty 10

## 2021-06-20 MED ORDER — MIDAZOLAM HCL 2 MG/2ML IJ SOLN: 2.0000 mg | Freq: Once | INTRAMUSCULAR | Status: AC

## 2021-06-20 MED ORDER — ROCURONIUM BROMIDE 10 MG/ML (PF) SYRINGE
PREFILLED_SYRINGE | INTRAVENOUS | Status: AC
  Administered 2021-06-20: 50 mg via INTRAVENOUS
  Filled 2021-06-20: qty 10

## 2021-06-20 NOTE — Consult Note (Signed)
? ?NAME:  Roberto Ponce, MRN:  [id number], DOB:  [birth year], LOS: 1 ?ADMISSION DATE:  06/18/2021, CONSULTATION DATE:  5/2 ?REFERRING MD:  Dr Doristine Bosworth, CHIEF COMPLAINT:  Hypoxia  ? ?History of Present Illness:  ?71 year old male with PMH as below, which is significant for CAD s/p STEMI in the 1980s. Recent course complicated by mitral valve prolapse with worsening regurgitation noted on TTE, and TEE. Classified as severe. Workup has included a left/right heart cath, which identified a 95% LCx occlusion treated with PCI. CVTS workup is pending once he is no longer requiring anti-platelet agents. Recent course also complicated by diagnosis of testicular cancer in march of this year treated with right radical inguinal orchiectomy. No other therapy indicated at this time.  ? ?He presented to [facility] ED 5/1 with complaints of shortness of breath. Symptoms started about one week prior with mild cough and SOB. Denied production with cough or sick contacts. Highest temp at home was 99.4. Also described orthopnea (normally sleeps with 1-2 pillows and needed to sleep upright on couch). He presented to urgent care 4/27 and was diagnosed as viral pneumonia and was discharged. Symptoms worsened causing him to present on 5/1. Reportedly he had develop body aches and chills per ED note, but he does not describe these symptoms to me. Imaging at that time suspicious for pneumonia in the left lower lobe. He was admitted to the hospitalists on treatment for CAP including supplemental oxygen. On 5/2 imaging looked worse there were increasing oxygen requirements. He was started on BiPAP for work of breathing. PCCM was consulted.  ? ?Pertinent  Medical History  ? has a past medical history of Asthma, Coronary artery disease, Glaucoma, History of gallstones, Hyperlipidemia, LVH (left ventricular hypertrophy), MI (myocardial infarction) ([place]), MI, old, Mild aortic sclerosis, Mitral regurgitation, Mixed dyslipidemia, and Venous  stasis. ? ? ?Significant Hospital Events: ?Including procedures, antibiotic start and stop dates in addition to other pertinent events   ? ? ?Interim History / Subjective:  ? ? ?Objective   ?Blood pressure 126/69, pulse 75, temperature 98.8 ?F (37.1 ?C), temperature source Oral, resp. rate (!) 27, height '5\' 9"'$  (1.753 m), weight 81.4 kg, SpO2 100 %. ?   ?   ? ?Intake/Output Summary (Last 24 hours) at 06/20/2021 1122 ?Last data filed at 06/19/2021 2000 ?Gross per 24 hour  ?Intake 502.63 ml  ?Output 350 ml  ?Net 152.63 ml  ? ?Filed Weights  ? 06/18/21 1359 06/19/21 0500  ?Weight: 70.8 kg 81.4 kg  ? ? ?Examination: ?General: Elderly appearing male on BiPAP ?HENT: [place]/AT, PERRL, unable to appreciate JVD ?Lungs: Coarse crackles on the L, less-so on the right. Tachypnea to 35 on BiPAP 60% FiO2.  ?Cardiovascular: Tachy, regular. Systolic murmur appreciated.  ?Abdomen: Soft, non-tender, non-distended ?Extremities: No acute deformity or ROM limitation. No edema.  ?Neuro: Sleepy, but easily arouses to verbal. Answers questions appropriately.  ? ?CT reviewed by me: Extensive bilateral consolidation. Small bilateral effusions. Mediastinal adenopathy likely reactive but concerning with recent testicular cancer.  ? ? ?Resolved Hospital Problem list   ? ? ?Assessment & Plan:  ? ?Acute hypoxemic respiratory failure: Unilateral opacification on CXR of the left lung. History is significant for cough, low grade fevers, orthopnea. Known severe MR. No sick contacts. Looking more like pneumonia on imaging so far, but unilateral edema is possible with mitral valve disease, however, typically on the right. RVP negative. Cultures no growth to date. PCT low. CT consistent with multifocal pneumonia.  ?- Transfer  to ICU for close monitoring of airway and breathing.  ?- BiPAP not a great therapy for extensive pneumonia. Will try heated high flow which has good data in this setting. If cannot tolerate will need to proceed with intubation. I have  discussed this with patient and family.  ?- Escalate antibiotics. Cefepime, Vanco, azithromycin ?- Steroids started 5/2 ?- Cultures, Urine legionella pending ?- Defer diuresis ? ?Severe mitral stenosis ?CAD s/p CABG in 1980s. S/p PCI to LCx recently by Dr. Burt Knack.  ?- I have asked for cardiology consultation at family request.  ?- Telemetry monitoring ?- Continuing home  ASA, Zetia, statin, plavix ? ?Hyponatremia: remaining stable ~ 120 since admission despite salt tabs.  ?- Salt tabs ongoing ?- Will give gentle volume ?- Trend Na q 6 hours ? ? ?Best Practice (right click and "Reselect all SmartList Selections" daily)  ? ?Diet/type: Regular consistency (see orders) ?DVT prophylaxis: LMWH ?GI prophylaxis: PPI ?Lines: N/A ?Foley:  N/A ?Code Status:  full code ?Last date of multidisciplinary goals of care discussion [ Discussed code status with patient and family 5/2. Full code.] ? ?Labs   ?CBC: ?Recent Labs  ?Lab 06/18/21 ?1457 06/18/21 ?1735 06/19/21 ?0429 06/20/21 ?0409  ?WBC 11.1* 11.1* 10.6* 14.3*  ?NEUTROABS 8.8*  --   --  12.3*  ?HGB 11.1* 10.6* 9.8* 10.5*  ?HCT 31.4* 29.9* 28.6* 30.1*  ?MCV 85.1 85.9 86.7 86.0  ?PLT 166 159 149* 191  ? ? ?Basic Metabolic Panel: ?Recent Labs  ?Lab 06/18/21 ?1457 06/18/21 ?1735 06/19/21 ?0429 06/19/21 ?1137 06/19/21 ?2127 06/20/21 ?0409  ?NA 120*  --  121* 119* 119* 120*  ?K 3.7  --  3.5  --   --  3.7  ?CL 89*  --  91*  --   --  87*  ?CO2 23  --  24  --   --  24  ?GLUCOSE 128*  --  103*  --   --  111*  ?BUN 8  --  7*  --   --  7*  ?CREATININE 0.63 0.56* 0.63  --   --  0.59*  ?CALCIUM 7.9*  --  7.8*  --   --  7.6*  ?MG  --  1.6*  --   --   --   --   ? ?GFR: ?Estimated Creatinine Clearance: 85.9 mL/min (A) (by C-G formula based on SCr of 0.59 mg/dL (L)). ?Recent Labs  ?Lab 06/18/21 ?1457 06/18/21 ?1555 06/18/21 ?1735 06/19/21 ?0429 06/20/21 ?0409 06/20/21 ?7824 06/20/21 ?1034  ?PROCALCITON  --   --  <0.10  --   --  0.15  --   ?WBC 11.1*  --  11.1* 10.6* 14.3*  --   --    ?LATICACIDVEN  --  1.3 1.2  --   --  1.4 1.4  ? ? ?Liver Function Tests: ?Recent Labs  ?Lab 06/18/21 ?1457  ?AST 96*  ?ALT 104*  ?ALKPHOS 61  ?BILITOT 0.5  ?PROT 6.5  ?ALBUMIN 3.0*  ? ?No results for input(s): LIPASE, AMYLASE in the last 168 hours. ?No results for input(s): AMMONIA in the last 168 hours. ? ?ABG ?   ?Component Value Date/Time  ? PHART 7.41 06/20/2021 0200  ? PCO2ART 38 06/20/2021 0200  ? PO2ART 76 (L) 06/20/2021 0200  ? HCO3 23.9 06/20/2021 0200  ? TCO2 28 02/14/2021 1507  ? TCO2 27 02/14/2021 1507  ? ACIDBASEDEF 0.1 06/20/2021 0200  ? O2SAT 97.4 06/20/2021 0200  ?  ? ?Coagulation Profile: ?No results for input(s): INR,  PROTIME in the last 168 hours. ? ?Cardiac Enzymes: ?No results for input(s): CKTOTAL, CKMB, CKMBINDEX, TROPONINI in the last 168 hours. ? ?HbA1C: ?No results found for: HGBA1C ? ?CBG: ?No results for input(s): GLUCAP in the last 168 hours. ? ?Review of Systems:   ?Bolds are positive  ?Constitutional: weight loss, gain, night sweats, Fevers, chills, fatigue .  ?HEENT: headaches, Sore throat, sneezing, nasal congestion, post nasal drip, Difficulty swallowing, Tooth/dental problems, visual complaints visual changes, ear ache ?CV:  chest pain, radiates:,Orthopnea, PND, swelling in lower extremities, dizziness, palpitations, syncope.  ?GI  heartburn, indigestion, abdominal pain, nausea, vomiting, diarrhea, change in bowel habits, loss of appetite, bloody stools.  ?Resp: cough, productive: , hemoptysis, dyspnea, chest pain, pleuritic.  ?Skin: rash or itching or icterus ?GU: dysuria, change in color of urine, urgency or frequency. flank pain, hematuria  ?MS: joint pain or swelling. decreased range of motion  ?Psych: change in mood or affect. depression or anxiety.  ?Neuro: difficulty with speech, weakness, numbness, ataxia  ? ? ?Past Medical History:  ?He,  has a past medical history of Asthma, Coronary artery disease, Glaucoma, History of gallstones, Hyperlipidemia, LVH (left ventricular  hypertrophy), MI (myocardial infarction) ([place]), MI, old, Mild aortic sclerosis, Mitral regurgitation, Mixed dyslipidemia, and Venous stasis.  ? ?Surgical History:  ? ?Past Surgical History:  ?Procedure Laterality Date  ? CAR

## 2021-06-20 NOTE — Progress Notes (Signed)
RT NOTE: ? ?Pt taken off BiPAP at this time to trial WOB without machine. Pt placed on 8L salter with saturations of 97-98%. WOB looks good at this time and pt states his breathing feels better than before. RN is aware. Vitals are stable at this time, RT will continue to monitor.  ?

## 2021-06-20 NOTE — Progress Notes (Signed)
?   06/19/21 2213  ?Assess: MEWS Score  ?ECG Heart Rate 87  ?Resp (!) 28  ?Level of Consciousness Alert  ?SpO2 95 %  ?O2 Device Nasal Cannula  ?O2 Flow Rate (L/min) 4 L/min  ?Assess: MEWS Score  ?MEWS Temp 0  ?MEWS Systolic 0  ?MEWS Pulse 0  ?MEWS RR 2  ?MEWS LOC 0  ?MEWS Score 2  ?MEWS Score Color Yellow  ?Assess: if the MEWS score is Yellow or Red  ?Were vital signs taken at a resting state? Yes  ?Focused Assessment Change from prior assessment (see assessment flowsheet)  ?Does the patient meet 2 or more of the SIRS criteria? Yes  ?Does the patient have a confirmed or suspected source of infection? Yes  ?Provider and Rapid Response Notified? Yes  ?MEWS guidelines implemented *See Row Information* Yes  ?Treat  ?MEWS Interventions Administered scheduled meds/treatments;Administered prn meds/treatments;Escalated (See documentation below);Consulted Respiratory Therapy  ?Pain Scale 0-10  ?Pain Score 0  ?Take Vital Signs  ?Increase Vital Sign Frequency  Yellow: Q 2hr X 2 then Q 4hr X 2, if remains yellow, continue Q 4hrs  ?Escalate  ?MEWS: Escalate Yellow: discuss with charge nurse/RN and consider discussing with provider and RRT  ?Notify: Charge Nurse/RN  ?Date Charge Nurse/RN Notified 06/19/21  ?Time Charge Nurse/RN Notified 2300  ?Notify: Provider  ?Provider Name/Title Olena Heckle  ?Date Provider Notified 06/19/21  ?Time Provider Notified 2226  ?Notification Type Page  ?Notification Reason Change in status;Critical result  ?Provider response En route  ?Date of Provider Response 06/19/21  ?Time of Provider Response 2300  ?Notify: Rapid Response  ?Date Rapid Response Notified 06/19/21  ?Time Rapid Response Notified 2300  ?Document  ?Patient Outcome Not stable and remains on department;Transferred/level of care increased  ?Assess: SIRS CRITERIA  ?SIRS Temperature  0  ?SIRS Pulse 0  ?SIRS Respirations  1  ?SIRS WBC 0  ?SIRS Score Sum  1  ? ? ?

## 2021-06-20 NOTE — Progress Notes (Signed)
Peripherally Inserted Central Catheter Placement ? ?The IV Nurse has discussed with the patient and/or persons authorized to consent for the patient, the purpose of this procedure and the potential benefits and risks involved with this procedure.  The benefits include less needle sticks, lab draws from the catheter, and the patient may be discharged home with the catheter. Risks include, but not limited to, infection, bleeding, blood clot (thrombus formation), and puncture of an artery; nerve damage and irregular heartbeat and possibility to perform a PICC exchange if needed/ordered by physician.  Alternatives to this procedure were also discussed.  Bard Power PICC patient education guide, fact sheet on infection prevention and patient information card has been provided to patient /or left at bedside.   ? ?Wife at bedside agreeable to PICC placement and signed consent. ? ?PICC Placement Documentation  ?PICC Triple Lumen 83/41/96 Right Basilic 43 cm 2 cm (Active)  ?Indication for Insertion or Continuance of Line Vasoactive infusions 06/20/21 2213  ?Exposed Catheter (cm) 2 cm 06/20/21 2213  ?Site Assessment Clean, Dry, Intact 06/20/21 2213  ?Lumen #1 Status Flushed;Saline locked;Blood return noted 06/20/21 2213  ?Lumen #2 Status Flushed;Saline locked;Blood return noted 06/20/21 2213  ?Lumen #3 Status Flushed;Saline locked;Blood return noted 06/20/21 2213  ?Dressing Type Transparent;Securing device 06/20/21 2213  ?Dressing Status Antimicrobial disc in place 06/20/21 2213  ?Safety Lock Not Applicable 22/29/79 8921  ?Line Care Connections checked and tightened 06/20/21 2213  ?Line Adjustment (NICU/IV Team Only) No 06/20/21 2213  ?Dressing Intervention New dressing 06/20/21 2213  ?Dressing Change Due 06/27/21 06/20/21 2213  ? ? ? ? ? ?Roberto Ponce  Roberto Ponce ?06/20/2021, 10:19 PM ? ?

## 2021-06-20 NOTE — Progress Notes (Signed)
? ? ?  OVERNIGHT PROGRESS REPORT ? ? ?Notified by RN/rapid response RN for concern over patient respiratory rate, patient work of breathing.Marland Kitchen ?Patient was awake and responsive initially given breathing treatment and increased oxygen by RT to high flow O2 at 10 L. ?Patient had been on low rate oxygen 2 to 5 Lpm during the day. ? ?At the conclusion of breathing treatment patient rate had reduced to the 20s and SPO2 was 95%. ?He expressed some relief and improvement ?Chest x-ray is obtained and available shows the following in part: ? ?"FINDINGS: ?Patient is status post cardiac surgery. Cardiomediastinal silhouette ?is within normal limits. There is new diffuse multifocal airspace ?disease throughout the left lung and new patchy airspace disease in ?the right mid and lower lung laterally. No pleural effusion or ?pneumothorax identified. No acute fractures. ?  ?IMPRESSION: ?1. New diffuse multifocal left lung airspace disease are new right ?mid and lower lung airspace disease. Findings are worrisome for ?multifocal pneumonia. ?  ? ?Electronically Signed ?  By: Ronney Asters M.D. ?  On: 06/19/2021 23:54" ?  ?Previous xray was:  ? ?"FINDINGS: ?Post median sternotomy and CABG. The heart is normal in size. There ?is peripheral subpleural reticulation the basilar predominant ?distribution that is similar to prior CT. Basilar honeycombing. ?Patchy left lower lobe opacity. No pleural effusion or pneumothorax. ?No acute osseous findings. ?  ?IMPRESSION: ?1. Patchy left lower lobe opacity suspicious for pneumonia in the ?setting of cough and fever. ?2. Background chronic interstitial lung disease, unchanged from ?prior CT. ?  ?Electronically Signed ?  By: Keith Rake M.D. ?  On: 06/18/2021 15:41" ? ?Antibiotic coverage is ordered previously by attending. ?ABG is pending. ?Patient will be transferred to progressive floor with BiPAP. ? ? ? ?Gershon Cull MSNA MSN ACNPC-AG ?Acute Care Nurse Practitioner ?[facility] ?[place] ? ? ? ?

## 2021-06-20 NOTE — Progress Notes (Signed)
Pharmacy Antibiotic Note ? ?Roberto Ponce is a 71 y.o. male admitted on 06/18/2021 with pneumonia.  Pharmacy has been consulted for cefepime and vancomycin dosing. ? ?Plan: ?Cefepime 2 g IV every 8 hours ?Vancomycin 2000 mg IV x 1 then 1500 mg IV every 24 hours (Goal AUC 400-550. Expected AUC: 466.7. SCr used: rounded to 0.8) ?Monitor clinical progress, renal function, vancomycin levels as indicated ?F/U C&S, abx deescalation / LOT ? ? ?Height: '5\' 9"'$  (175.3 cm) ?Weight: 81.4 kg (179 lb 6.4 oz) ?IBW/kg (Calculated) : 70.7 ? ?Temp (24hrs), Avg:99.1 ?F (37.3 ?C), Min:98 ?F (36.7 ?C), Max:100.5 ?F (38.1 ?C) ? ?Recent Labs  ?Lab 06/18/21 ?1457 06/18/21 ?1555 06/18/21 ?1735 06/19/21 ?0429 06/20/21 ?0409 06/20/21 ?8841 06/20/21 ?1034  ?WBC 11.1*  --  11.1* 10.6* 14.3*  --   --   ?CREATININE 0.63  --  0.56* 0.63 0.59*  --   --   ?LATICACIDVEN  --  1.3 1.2  --   --  1.4 1.4  ?  ?Estimated Creatinine Clearance: 85.9 mL/min (A) (by C-G formula based on SCr of 0.59 mg/dL (L)).   ? ?No Known Allergies ? ?Antimicrobials this admission: ?4/30 azithromycin >> ?4/30 ceftriaxone >> 5/2 ?5/2 cefepime >> ?5/2 vancomycin >> ? ?Microbiology results: ?4/30 BCx: NGTD ?4/30 Sputum: collected ?5/2 MRSA PCR: sent ? ?Thank you for allowing pharmacy to be a part of this patient?s care. ? ?Royetta Asal, PharmD, BCPS ?Clinical Pharmacist ?[place] ?Please utilize Amion for appropriate phone number to reach the unit pharmacist ([place]) ?06/20/2021 2:30 PM ? ? ?

## 2021-06-20 NOTE — Consult Note (Signed)
?Cardiology Consultation:  ?Patient ID: Roberto Ponce ?MRN: [id number]; DOB: [birth year] ? ?Admit date: 06/18/2021 ?Date of Consult: 06/20/2021 ? ?Primary Care Provider: Kristen Loader, FNP ?Primary Cardiologist: Mertie Moores, MD  ?Primary Electrophysiologist:  None  ? ?Patient Profile:  ?Roberto Ponce is a 71 y.o. male with a hx of CAD status post CABG, hyperlipidemia, testicular cancer s/p orchiectomy, mitral valve regurgitation who is being seen today for the evaluation of acute hypoxic respiratory failure at the request of Darliss Cheney, MD. ? ?History of Present Illness:  ?Roberto Ponce presents with with cough and shortness of breath over the past week.  He also reports subjectively low-grade fever.  He apparently was sent to an urgent care and informed he had a viral pneumonia and was sent home without antibiotics.  Symptoms continued to worsen and he presented to the emergency room.  On arrival to the emergency room he was found to have chest x-ray with significant infiltrates as well as hypoxic respiratory failure secondary to community-acquired pneumonia.  He was also noted to have hyponatremia which was thought to be related to SIADH in the setting of pneumonia.  His condition continued to worsen on 06/20/2021.  He continued to develop worsening respiratory failure and was placed on BiPAP.  He has been transferred to the ICU.  Currently on 10 L of oxygen.  Cardiology was consulted due to history of severe mitral valve regurgitation in setting of recent PCI to left circumflex.  I have reviewed his echocardiograms which do demonstrate severe mitral valve regurgitation.  This was thought to be related to ischemic etiology.  However heart catheterization did not demonstrate a large V wave which was inconsistent with this diagnosis.  Medical management was recommended at that time. ? ?At the time my examination he is noticeably tachypneic.  Currently on 10 L of oxygen.  He has minimal JVD.  He has no  evidence of volume overload.  He does not have a murmur consistent with severe mitral valve regurgitation.  Laboratory data is consistent with hyponatremia with sodium of 120.  Serum creatinine 0.59.  Lactic acid normal.  Procalcitonin 0.15.  He does have an elevated white blood cell count at 14.3.  Hemoglobin 10.5.  Platelets 191.  Chest CT was obtained which shows nonspecific mediastinal adenopathy.  He has extensive bilateral airspace consolidation disease with left greater than right consolidations consistent with pneumonia.  He has small bilateral pleural effusions.  Overall picture is inconsistent with severe or acute mitral valve regurgitation. ? ?Heart Pathway Score:   ?   ? ?Past Medical History: ?Past Medical History:  ?Diagnosis Date  ? Asthma   ? Coronary artery disease   ? Glaucoma   ? History of gallstones   ? Hyperlipidemia   ? LVH (left ventricular hypertrophy)   ? MI (myocardial infarction) ([place])   ? MI, old   ? Mild aortic sclerosis   ? Mitral regurgitation   ? Mixed dyslipidemia   ? Venous stasis   ? ? ?Past Surgical History: ?Past Surgical History:  ?Procedure Laterality Date  ? CARDIAC CATHETERIZATION  09/09/1986  ? EF 56%  ? CARDIOVASCULAR STRESS TEST  12/16/2007  ? EF 54% NO ISCHEMIA  ? CHOLECYSTECTOMY    ? CORONARY ARTERY BYPASS GRAFT    ? LIMA TO THE LAD AND RIMA TO RIGHT CORONARY ARTERY  ? CORONARY STENT INTERVENTION N/A 02/14/2021  ? Procedure: CORONARY STENT INTERVENTION;  Surgeon: Sherren Mocha, MD;  Location: [place] [facility];  Service:  Cardiovascular;  Laterality: N/A;  ? ORCHIECTOMY Right 04/19/2021  ? Procedure: RIGHT RADICAL INGUINAL ORCHIECTOMY;  Surgeon: Lucas Mallow, MD;  Location: [facility];  Service: Urology;  Laterality: Right;  1 HR FOR CASE  ? RIGHT/LEFT HEART CATH AND CORONARY/GRAFT ANGIOGRAPHY N/A 02/14/2021  ? Procedure: RIGHT/LEFT HEART CATH AND CORONARY/GRAFT ANGIOGRAPHY;  Surgeon: Sherren Mocha, MD;  Location: [place] [facility];  Service: Cardiovascular;   Laterality: N/A;  ? TEE WITHOUT CARDIOVERSION N/A 09/02/2020  ? Procedure: TRANSESOPHAGEAL ECHOCARDIOGRAM (TEE);  Surgeon: Werner Lean, MD;  Location: [facility];  Service: Cardiovascular;  Laterality: N/A;  ? US ECHOCARDIOGRAPHY  12/12/2007  ? EF 55-60%  ? VASECTOMY  1988  ?  ? ?Home Medications:  ?Prior to Admission medications   ?Medication Sig Start Date End Date Taking? Authorizing Provider  ?acetaminophen (TYLENOL) 500 MG tablet Take 1,000 mg by mouth every 6 (six) hours as needed for moderate pain.   Yes [provider]  ?aspirin EC 81 MG tablet Take 1 tablet (81 mg total) by mouth daily. 06/21/14  Yes Nahser, Wonda Cheng, MD  ?brimonidine (ALPHAGAN) 0.2 % ophthalmic solution Place 1 drop into the left eye 2 (two) times daily. 12/19/20  Yes [provider]  ?clopidogrel (PLAVIX) 75 MG tablet Take 1 tablet (75 mg total) by mouth daily with breakfast. 02/15/21  Yes Sande Rives E, PA-C  ?dorzolamide-timolol (COSOPT) 22.3-6.8 MG/ML ophthalmic solution Place 1 drop into both eyes 2 (two) times daily.   Yes [provider]  ?ezetimibe (ZETIA) 10 MG tablet TAKE 1 TABLET(10 MG) BY MOUTH DAILY ?Patient taking differently: Take 10 mg by mouth at bedtime. 04/16/19  Yes Nahser, Wonda Cheng, MD  ?latanoprost (XALATAN) 0.005 % ophthalmic solution Place 1 drop into both eyes at bedtime.  05/27/13  Yes [provider]  ?nitroGLYCERIN (NITROSTAT) 0.4 MG SL tablet Place 1 tablet (0.4 mg total) under the tongue every 5 (five) minutes as needed for chest pain. 02/15/21 02/15/22 Yes Sande Rives E, PA-C  ?rosuvastatin (CRESTOR) 40 MG tablet Take 40 mg by mouth at bedtime. 06/19/19  Yes [provider]  ?SODIUM FLUORIDE 5000 PPM 1.1 % PSTE Apply 1 application. topically in the morning and at bedtime. 05/17/21  Yes [provider]  ?triamcinolone cream (KENALOG) 0.1 % Apply 1 application topically 2 (two) times daily as needed (eczema).   Yes [provider]   ?oxycodone (OXY-IR) 5 MG capsule Take 1 capsule (5 mg total) by mouth every 4 (four) hours as needed. 04/19/21   Lucas Mallow, MD  ? ? ?Inpatient Medications: ?Scheduled Meds: ? aspirin EC  81 mg Oral Daily  ? brimonidine  1 drop Left Eye BID  ? Chlorhexidine Gluconate Cloth  6 each Topical Daily  ? clopidogrel  75 mg Oral Q breakfast  ? dorzolamide-timolol  1 drop Both Eyes BID  ? enoxaparin (LOVENOX) injection  40 mg Subcutaneous Q24H  ? ezetimibe  10 mg Oral QHS  ? hydrocortisone sod succinate (SOLU-CORTEF) inj  50 mg Intravenous Q6H  ? ipratropium-albuterol  3 mL Nebulization BID  ? latanoprost  1 drop Both Eyes QHS  ? rosuvastatin  40 mg Oral QHS  ? sodium chloride flush  3 mL Intravenous Q12H  ? sodium chloride  1 g Oral BID WC  ? ?Continuous Infusions: ? sodium chloride 10 mL/hr at 06/19/21 2000  ? sodium chloride    ? azithromycin 500 mg (06/20/21 0003)  ? ceFEPime (MAXIPIME) IV 2 g (06/20/21 1445)  ? [  START ON 06/21/2021] vancomycin    ? vancomycin    ? ?PRN Meds: ?sodium chloride, acetaminophen, albuterol, guaiFENesin, nitroGLYCERIN, ondansetron **OR** ondansetron (ZOFRAN) IV, phenol ? ?Allergies:    ?No Known Allergies ? ?Social History:   ?Social History  ? ?Socioeconomic History  ? Marital status: Married  ?  Spouse name: Not on file  ? Number of children: Not on file  ? Years of education: Not on file  ? Highest education level: Not on file  ?Occupational History  ? Not on file  ?Tobacco Use  ? Smoking status: Never  ? Smokeless tobacco: Never  ?Vaping Use  ? Vaping Use: Never used  ?Substance and Sexual Activity  ? Alcohol use: No  ? Drug use: No  ? Sexual activity: Not on file  ?Other Topics Concern  ? Not on file  ?Social History Narrative  ? Not on file  ? ?Social Determinants of Health  ? ?Financial Resource Strain: Not on file  ?Food Insecurity: Not on file  ?Transportation Needs: Not on file  ?Physical Activity: Not on file  ?Stress: Not on file  ?Social Connections: Not on file  ?Intimate  Partner Violence: Not on file  ?  ? ?Family History:   ? ?Family History  ?Problem Relation Age of Onset  ? Heart attack Father   ? Stroke Father   ?  ? ?ROS:  ?All other ROS reviewed and negative. Pertinent po

## 2021-06-20 NOTE — Progress Notes (Signed)
eLink Physician-Brief Progress Note ?Patient Name: Roberto Ponce ?DOB: 08/15/1950 ?MRN: [id number] ? ? ?Date of Service ? 06/20/2021  ?HPI/Events of Note ?   ?eICU Interventions ? ABG and CXR reviewed.  ? ? ? ?  ? ?Kerry Kass Aadhya Bustamante ?06/20/2021, 10:09 PM ?

## 2021-06-20 NOTE — Procedures (Signed)
Intubation Procedure Note ? ?Roberto Ponce  ?[id number]  ?05-19-1950 ? ?Date:06/20/21  ?Time:5:59 PM  ? ?Provider Performing:Ethell Blatchford Wells Guiles  ? ? ?Procedure: Intubation (49201) ? ?Indication(s) ?Respiratory Failure ? ?Consent ?Risks of the procedure as well as the alternatives and risks of each were explained to the patient and/or caregiver.  Consent for the procedure was obtained and is signed in the bedside chart ? ? ?Anesthesia ?Etomidate, Versed, Fentanyl, and Rocuronium ? ? ?Time Out ?Verified patient identification, verified procedure, site/side was marked, verified correct patient position, special equipment/implants available, medications/allergies/relevant history reviewed, required imaging and test results available. ? ? ?Sterile Technique ?Usual hand hygeine, masks, and gloves were used ? ? ?Procedure Description ?Patient positioned in bed supine.  Sedation given as noted above.  Patient was intubated with endotracheal tube using Glidescope.  View was Grade 1 full glottis .  Number of attempts was 1.  Colorimetric CO2 detector was consistent with tracheal placement. ? ? ?Complications/Tolerance ?None; patient tolerated the procedure well. ?Chest X-ray is ordered to verify placement. ? ? ?EBL ?Minimal ? ? ?Specimen(s) ?None ? ? ?Georgann Housekeeper, AGACNP-BC ?[facility] ? ?See Amion for personal pager ?PCCM on call pager [phone] until 7pm. ?Please call Elink 7p-7a. [phone] ? ?06/20/2021 6:00 PM ? ? ? ? ? ?

## 2021-06-20 NOTE — Plan of Care (Signed)
  Problem: Activity: Goal: Ability to tolerate increased activity will improve Outcome: Progressing   Problem: Clinical Measurements: Goal: Ability to maintain a body temperature in the normal range will improve Outcome: Progressing   Problem: Respiratory: Goal: Ability to maintain adequate ventilation will improve Outcome: Progressing   

## 2021-06-20 NOTE — Progress Notes (Signed)
Pt transported to CT and back, O2 increased to 10 liters for the transport O2 sat 95% at rest and during move 88%, pt up to BSC hs large BM O2 at 10 liters during activity, RN remained with pt during entire events. Pt remain awake and alert, talking and following commands. Reported called to [facility]. SRP, RN ?

## 2021-06-20 NOTE — Progress Notes (Signed)
At bedside. MD and team at bedside due to hypotension. Unable to place a USGPIV at this time . PICC ordered and a central line may be placed if PICC not placed urgently. ?

## 2021-06-20 NOTE — Op Note (Signed)
Name:  [name] ?MRN:  [id number] ?DOB:  1950-03-11 ? ?PROCEDURE NOTE ? ?Procedure(s): ?Flexible bronchoscopy (35248) ?Bronchial alveolar lavage (18590) of the Right Middle Lobe ? ?Indications:  ALI idiopathic ? ?Consent:  Procedure, benefits, risks and alternatives discussed.  Questions answered.  Consent obtained. ? ?Anesthesia:  Intubated on Vent 60% fio2 ? ?Location: [facility] 1231 ? ?Procedure summary:  Appropriate equipment was assembled.  The patient was b identified as Roberto Ponce with 1950-06-02 ? Safety timeout was performed.  ?After the appropriate level of sedatioj was assured, flexible video bronchoscope was lubricated and inserted through the E ttube . Roc givenTotal of 0 mL of 1% Lidocaine were administered through the bronchoscope to augment moderate sedation ? ?Airway examination was briefly performed right side .  Minimal clear secretions were noted, mucosa appeared normal and no endobronchial lesions were identified. ? ?Bronchial alveolar lavage of the RML was performed with 90 mL of normal saline  x 1 with red returns and this then resulted in 20cc x 1 with pink return and 20 x 1 with white pink return. Tottal return of 60 mL of fluid, after which the bronchoscope was withdrawn. ?After hemostasis was assure, the bronchoscope was withdrawn. ? ? ? ?Post-procedure chest x-ray was yes ordered. ? ?Specimens sent: ?Bronchial alveolar lavage specimen of the RML for cell count microbiology and cytology.and viroolgy ? ?Complications:  No immediate complications were noted.  BP low and and then responded to pressor ? ?Estimated blood loss:  none ? ?IMPRESSION ?1. RML BAL ?2. No alvveolar hemorrhage ? ? ?Followup ?ICU rounding ? ?Dr. Brand Males, M.D., F.C.C.P ?Pulmonary and Critical Care Medicine ?Staff Physician ?[facility] ?[facility] ?Pager: [phone], If no answer or between  15:00h - 7:00h: call [phone] ? ?06/20/2021 ?7:06 PM ? ? ? ? ?

## 2021-06-20 NOTE — Progress Notes (Addendum)
?PROGRESS NOTE ? ? ? ?ELRIDGE STEMM  XAJ:[id number] DOB: 1950/08/10 DOA: 06/18/2021 ?PCP: Kristen Loader, FNP ? ? ?Brief Narrative:  ?Roberto Ponce is a 71 y.o. male with medical history significant of CAD, MI s/p CABG, HLD, recent testicular seminoma s/p orchectomy presented to ED with a complaint of cough/shortness of breath.  Admitted under hospitalist service with community-acquired pneumonia. ? ?Assessment & Plan: ?  ?Principal Problem: ?  CAP (community acquired pneumonia) ?Active Problems: ?  CAD (coronary artery disease) of artery bypass graft ?  Acute hyponatremia ?  Severe sepsis ([place]) ?  Acute respiratory failure with hypoxia ([place]) ? ? ?Acute hypoxic respiratory failure (not POA) secondary to community-acquired pneumonia, POA/severe sepsis (not POA): Chest x-ray indicates pneumonia.  Blood cultures negative so far.  Sputum culture pending.  Procalcitonin very low.  Patient had improved somewhat yesterday while on Rocephin and Zithromax and was not on oxygen at all however overnight, he had worsening respiratory distress and was placed on BiPAP and transferred to stepdown unit.  P chest x-ray shows significantly worsening and new infiltrates on the left side.  He is currently on BiPAP as well.  Now patient meets criteria for severe sepsis based on tachypnea, leukocytosis and acute hypoxic respiratory failure.  Urine streptococci are negative.  Legionella still pending but I doubt it would be positive.  Patient is retired and denies any exposure to birds, animals or birds however he lives in a house which is built in [place] so there is possible concern of mold and fungus.  Due to rapid worsening, I have consulted PCCM who is going to see him.  For new recommendations to [facility] by PCCM and based on the recent [place] general medicine recommendation, we are going to start him on hydrocortisone 50 mg every 6 hours for 4 to 8 days.  Defer further management to pulmonology. ?  ?Acute  hyponatremia: Lab work indicates SIADH.  Patient's sodium has remained stable around 120 despite of starting him on sodium chloride tablets. ? ?History of CAD and MI s/p CABG and recent stent December 2022: No chest pain.  Continue DAPT and rest of the medications.  PCCM reached out to me that patient has requested cardiology consult and PCCM is going to reach out to cardiology. ? ?Generalized weakness: PT OT on board. ?  ?Hyperlipidemia: Continue home medications. ? ?Diarrhea: Improved.  C. difficile negative. ? ?DVT prophylaxis: enoxaparin (LOVENOX) injection 40 mg Start: 06/18/21 1800 ?  Code Status: Full Code  ?Family Communication: Wife present at bedside.  Plan of care discussed with patient in length and he/she verbalized understanding and agreed with it. ? ?Status is: Observation ?The patient will require care spanning > 2 midnights and should be moved to inpatient because: Hyponatremia and he is still symptomatic. ? ? ?Estimated body mass index is 26.49 kg/m? as calculated from the following: ?  Height as of this encounter: '5\' 9"'$  (1.753 m). ?  Weight as of this encounter: 81.4 kg. ? ?  ?Nutritional Assessment: ?Body mass index is 26.49 kg/m?Marland KitchenMarland Kitchen ?Seen by dietician.  I agree with the assessment and plan as outlined below: ?Nutrition Status: ?  ?  ?  ? ?. ?Skin Assessment: ?I have examined the patient's skin and I agree with the wound assessment as performed by the wound care RN as outlined below: ?  ? ?Consultants:  ?PCCM ? ?Procedures:  ?None ? ?Antimicrobials:  ?Anti-infectives (From admission, onward)  ? ? Start     Dose/Rate Route  Frequency Ordered Stop  ? 06/19/21 1600  cefTRIAXone (ROCEPHIN) 2 g in sodium chloride 0.9 % 100 mL IVPB       ? 2 g ?200 mL/hr over 30 Minutes Intravenous Every 24 hours 06/18/21 1708 06/24/21 1559  ? 06/19/21 0000  azithromycin (ZITHROMAX) 500 mg in sodium chloride 0.9 % 250 mL IVPB       ? 500 mg ?250 mL/hr over 60 Minutes Intravenous Every 24 hours 06/18/21 1708 06/23/21 2359   ? 06/18/21 1730  cefTRIAXone (ROCEPHIN) 1 g in sodium chloride 0.9 % 100 mL IVPB       ? 1 g ?200 mL/hr over 30 Minutes Intravenous NOW 06/18/21 1718 06/18/21 2222  ? 06/18/21 1545  cefTRIAXone (ROCEPHIN) 1 g in sodium chloride 0.9 % 100 mL IVPB       ? 1 g ?200 mL/hr over 30 Minutes Intravenous  Once 06/18/21 1535 06/18/21 1634  ? 06/18/21 1545  azithromycin (ZITHROMAX) 500 mg in sodium chloride 0.9 % 250 mL IVPB       ? 500 mg ?250 mL/hr over 60 Minutes Intravenous  Once 06/18/21 1535 06/18/21 1746  ? ?  ?  ? ? ?Subjective: ? ?Patient seen and examined.  Wife at the bedside.  Patient on BiPAP.  Complains of shortness of breath however he is slightly improved compared to last night.  No new complaint. ? ?Objective: ?Vitals:  ? 06/20/21 0801 06/20/21 0837 06/20/21 1030 06/20/21 1146  ?BP:  121/67 126/69   ?Pulse: 84 70 75   ?Resp:  (!) 26 (!) 27   ?Temp:  98 ?F (36.7 ?C) 98.8 ?F (37.1 ?C)   ?TempSrc:  Axillary Oral   ?SpO2: 95% 100% 100% 97%  ?Weight:      ?Height:      ? ? ?Intake/Output Summary (Last 24 hours) at 06/20/2021 1209 ?Last data filed at 06/19/2021 2000 ?Gross per 24 hour  ?Intake 502.63 ml  ?Output 350 ml  ?Net 152.63 ml  ? ?Filed Weights  ? 06/18/21 1359 06/19/21 0500  ?Weight: 70.8 kg 81.4 kg  ? ? ?Examination: ? ?General exam: Appears calm and comfortable but on BiPAP ?Respiratory system: Bilateral diffuse rhonchi, left greater than the right, poor inspiratory effort.  Slightly tachypneic. ?Cardiovascular system: S1 & S2 heard, RRR. No JVD, murmurs, rubs, gallops or clicks. No pedal edema. ?Gastrointestinal system: Abdomen is nondistended, soft and nontender. No organomegaly or masses felt. Normal bowel sounds heard. ?Central nervous system: Alert and oriented. No focal neurological deficits. ?Extremities: Symmetric 5 x 5 power. ?Skin: No rashes, lesions or ulcers.  ?Psychiatry: Judgement and insight appear normal. Mood & affect appropriate.  ? ? ?Data Reviewed: I have personally reviewed following  labs and imaging studies ? ?CBC: ?Recent Labs  ?Lab 06/18/21 ?1457 06/18/21 ?1735 06/19/21 ?0429 06/20/21 ?0409  ?WBC 11.1* 11.1* 10.6* 14.3*  ?NEUTROABS 8.8*  --   --  12.3*  ?HGB 11.1* 10.6* 9.8* 10.5*  ?HCT 31.4* 29.9* 28.6* 30.1*  ?MCV 85.1 85.9 86.7 86.0  ?PLT 166 159 149* 191  ? ?Basic Metabolic Panel: ?Recent Labs  ?Lab 06/18/21 ?1457 06/18/21 ?1735 06/19/21 ?0429 06/19/21 ?1137 06/19/21 ?2127 06/20/21 ?0409  ?NA 120*  --  121* 119* 119* 120*  ?K 3.7  --  3.5  --   --  3.7  ?CL 89*  --  91*  --   --  87*  ?CO2 23  --  24  --   --  24  ?GLUCOSE 128*  --  103*  --   --  111*  ?BUN 8  --  7*  --   --  7*  ?CREATININE 0.63 0.56* 0.63  --   --  0.59*  ?CALCIUM 7.9*  --  7.8*  --   --  7.6*  ?MG  --  1.6*  --   --   --   --   ? ?GFR: ?Estimated Creatinine Clearance: 85.9 mL/min (A) (by C-G formula based on SCr of 0.59 mg/dL (L)). ?Liver Function Tests: ?Recent Labs  ?Lab 06/18/21 ?1457  ?AST 96*  ?ALT 104*  ?ALKPHOS 61  ?BILITOT 0.5  ?PROT 6.5  ?ALBUMIN 3.0*  ? ?No results for input(s): LIPASE, AMYLASE in the last 168 hours. ?No results for input(s): AMMONIA in the last 168 hours. ?Coagulation Profile: ?No results for input(s): INR, PROTIME in the last 168 hours. ?Cardiac Enzymes: ?No results for input(s): CKTOTAL, CKMB, CKMBINDEX, TROPONINI in the last 168 hours. ?BNP (last 3 results) ?No results for input(s): PROBNP in the last 8760 hours. ?HbA1C: ?No results for input(s): HGBA1C in the last 72 hours. ?CBG: ?No results for input(s): GLUCAP in the last 168 hours. ?Lipid Profile: ?No results for input(s): CHOL, HDL, LDLCALC, TRIG, CHOLHDL, LDLDIRECT in the last 72 hours. ?Thyroid Function Tests: ?No results for input(s): TSH, T4TOTAL, FREET4, T3FREE, THYROIDAB in the last 72 hours. ?Anemia Panel: ?No results for input(s): VITAMINB12, FOLATE, FERRITIN, TIBC, IRON, RETICCTPCT in the last 72 hours. ?Sepsis Labs: ?Recent Labs  ?Lab 06/18/21 ?1555 06/18/21 ?1735 06/20/21 ?0806 06/20/21 ?1034  ?PROCALCITON  --  <0.10  0.15  --   ?LATICACIDVEN 1.3 1.2 1.4 1.4  ? ? ?Recent Results (from the past 240 hour(s))  ?Expectorated Sputum Assessment w Gram Stain, Rflx to Resp Cult     Status: None  ? Collection Time: 06/18/21 12:02 AM  ? Spec

## 2021-06-20 NOTE — Progress Notes (Signed)
[facility] ADULT ICU REPLACEMENT PROTOCOL ? ? ?The patient does apply for the [facility] Adult ICU Electrolyte Replacment Protocol based on the criteria listed below:  ? ?1.Exclusion criteria: TCTS patients, ECMO patients, and Dialysis patients ?2. Is GFR >/= 30 ml/min? Yes.    ?Patient's GFR today is >60 ?3. Is SCr </= 2? Yes.   ?Patient's SCr is 0.47 mg/dL ?4. Did SCr increase >/= 0.5 in 24 hours? No. ?5.Pt's weight >40kg  Yes.   ?6. Abnormal electrolyte(s): K 3.4, mag 1.7  ?7. Electrolytes replaced per protocol ?8.  Call MD STAT for K+ </= 2.5, Phos </= 1, or Mag </= 1 ?Physician:   ? ?Orvil Feil, Oni Dietzman A 06/20/2021 9:43 PM ? ?

## 2021-06-20 NOTE — Procedures (Signed)
Arterial Catheter Insertion Procedure Note ? ?Roberto Ponce  ?[id number]  ?[birth year] ? ?Date:06/20/21  ?Time:6:39 PM  ? ? ?Provider Performing: Otelia Sergeant  ? ? ?Procedure: Insertion of Arterial Line [phone]) without US guidance ? ?Indication(s) ?Blood pressure monitoring and/or need for frequent ABGs ? ?Consent ?Risks of the procedure as well as the alternatives and risks of each were explained to the patient and/or caregiver.  Consent for the procedure was obtained and is signed in the bedside chart ? ?Anesthesia ?None ? ? ?Time Out ?Verified patient identification, verified procedure, site/side was marked, verified correct patient position, special equipment/implants available, medications/allergies/relevant history reviewed, required imaging and test results available. ? ? ?Sterile Technique ?Maximal sterile technique including full sterile barrier drape, hand hygiene, sterile gown, sterile gloves, mask, hair covering, sterile ultrasound probe cover (if used). ? ? ?Procedure Description ?Area of catheter insertion was cleaned with chlorhexidine and draped in sterile fashion. Without real-time ultrasound guidance an arterial catheter was placed into the right radial artery.  Appropriate arterial tracings confirmed on monitor.   ? ? ?Complications/Tolerance ?None; patient tolerated the procedure well. ? ? ?EBL ?Minimal ? ? ?Specimen(s) ?None ? ?

## 2021-06-20 NOTE — Plan of Care (Signed)
?  Problem: Clinical Measurements: ?Goal: Ability to maintain a body temperature in the normal range will improve ?Outcome: Progressing ?  ?Problem: Respiratory: ?Goal: Ability to maintain a clear airway will improve ?Outcome: Progressing ?  ?Problem: Education: ?Goal: Knowledge of General Education information will improve ?Description: Including pain rating scale, medication(s)/side effects and non-pharmacologic comfort measures ?Outcome: Progressing ?  ?Problem: Health Behavior/Discharge Planning: ?Goal: Ability to manage health-related needs will improve ?Outcome: Progressing ?  ?Problem: Clinical Measurements: ?Goal: Will remain free from infection ?Outcome: Progressing ?Goal: Cardiovascular complication will be avoided ?Outcome: Progressing ?  ?Problem: Nutrition: ?Goal: Adequate nutrition will be maintained ?Outcome: Progressing ?  ?Problem: Elimination: ?Goal: Will not experience complications related to bowel motility ?Outcome: Progressing ?Goal: Will not experience complications related to urinary retention ?Outcome: Progressing ?  ?Problem: Pain Managment: ?Goal: General experience of comfort will improve ?Outcome: Progressing ?  ?Problem: Safety: ?Goal: Ability to remain free from injury will improve ?Outcome: Progressing ?  ?Problem: Skin Integrity: ?Goal: Risk for impaired skin integrity will decrease ?Outcome: Progressing ?  ?Problem: Activity: ?Goal: Ability to tolerate increased activity will improve ?Outcome: Not Progressing ?  ?Problem: Respiratory: ?Goal: Ability to maintain adequate ventilation will improve ?Outcome: Not Progressing ?  ?Problem: Clinical Measurements: ?Goal: Ability to maintain clinical measurements within normal limits will improve ?Outcome: Not Progressing ?Goal: Diagnostic test results will improve ?Outcome: Not Progressing ?Goal: Respiratory complications will improve ?Outcome: Not Progressing ?  ?Problem: Activity: ?Goal: Risk for activity intolerance will decrease ?Outcome:  Not Progressing ?  ?Problem: Coping: ?Goal: Level of anxiety will decrease ?Outcome: Not Progressing ?  ?

## 2021-06-21 ENCOUNTER — Inpatient Hospital Stay (HOSPITAL_COMMUNITY): Payer: PPO

## 2021-06-21 DIAGNOSIS — I25810 Atherosclerosis of coronary artery bypass graft(s) without angina pectoris: Secondary | ICD-10-CM | POA: Diagnosis not present

## 2021-06-21 DIAGNOSIS — J9601 Acute respiratory failure with hypoxia: Secondary | ICD-10-CM | POA: Diagnosis not present

## 2021-06-21 DIAGNOSIS — I5021 Acute systolic (congestive) heart failure: Secondary | ICD-10-CM | POA: Diagnosis not present

## 2021-06-21 DIAGNOSIS — I340 Nonrheumatic mitral (valve) insufficiency: Secondary | ICD-10-CM | POA: Diagnosis not present

## 2021-06-21 DIAGNOSIS — J189 Pneumonia, unspecified organism: Secondary | ICD-10-CM | POA: Diagnosis not present

## 2021-06-21 DIAGNOSIS — R609 Edema, unspecified: Secondary | ICD-10-CM

## 2021-06-21 DIAGNOSIS — J80 Acute respiratory distress syndrome: Secondary | ICD-10-CM | POA: Diagnosis not present

## 2021-06-21 LAB — RESPIRATORY PANEL BY PCR

## 2021-06-21 LAB — GLUCOSE, CAPILLARY
Glucose-Capillary: 125 mg/dL — ABNORMAL HIGH (ref 70–99)
Glucose-Capillary: 140 mg/dL — ABNORMAL HIGH (ref 70–99)
Glucose-Capillary: 144 mg/dL — ABNORMAL HIGH (ref 70–99)
Glucose-Capillary: 163 mg/dL — ABNORMAL HIGH (ref 70–99)
Glucose-Capillary: 170 mg/dL — ABNORMAL HIGH (ref 70–99)
Glucose-Capillary: 179 mg/dL — ABNORMAL HIGH (ref 70–99)
Glucose-Capillary: 182 mg/dL — ABNORMAL HIGH (ref 70–99)

## 2021-06-21 LAB — MAGNESIUM: Magnesium: 2.3 mg/dL (ref 1.7–2.4)

## 2021-06-21 LAB — CBC
HCT: 29.4 % — ABNORMAL LOW (ref 39.0–52.0)
Hemoglobin: 10.2 g/dL — ABNORMAL LOW (ref 13.0–17.0)
MCH: 29.7 pg (ref 26.0–34.0)
MCHC: 34.7 g/dL (ref 30.0–36.0)
MCV: 85.5 fL (ref 80.0–100.0)
Platelets: 271 10*3/uL (ref 150–400)
RBC: 3.44 MIL/uL — ABNORMAL LOW (ref 4.22–5.81)
RDW: 13.9 % (ref 11.5–15.5)
WBC: 16.4 10*3/uL — ABNORMAL HIGH (ref 4.0–10.5)
nRBC: 0 % (ref 0.0–0.2)

## 2021-06-21 LAB — SODIUM
Sodium: 129 mmol/L — ABNORMAL LOW (ref 135–145)
Sodium: 128 mmol/L — ABNORMAL LOW (ref 135–145)

## 2021-06-21 LAB — COMPREHENSIVE METABOLIC PANEL
ALT: 171 U/L — ABNORMAL HIGH (ref 0–44)
AST: 152 U/L — ABNORMAL HIGH (ref 15–41)
Albumin: 2.3 g/dL — ABNORMAL LOW (ref 3.5–5.0)
Alkaline Phosphatase: 84 U/L (ref 38–126)
Anion gap: 6 (ref 5–15)
BUN: 10 mg/dL (ref 8–23)
CO2: 24 mmol/L (ref 22–32)
Calcium: 7.9 mg/dL — ABNORMAL LOW (ref 8.9–10.3)
Chloride: 97 mmol/L — ABNORMAL LOW (ref 98–111)
Creatinine, Ser: 0.53 mg/dL — ABNORMAL LOW (ref 0.61–1.24)
GFR, Estimated: >60 mL/min (ref >60)
Glucose, Bld: 193 mg/dL — ABNORMAL HIGH (ref 70–99)
Potassium: 4.1 mmol/L (ref 3.5–5.1)
Sodium: 127 mmol/L — ABNORMAL LOW (ref 135–145)
Total Bilirubin: 0.8 mg/dL (ref 0.3–1.2)
Total Protein: 5.8 g/dL — ABNORMAL LOW (ref 6.5–8.1)

## 2021-06-21 LAB — SJOGRENS SYNDROME-B EXTRACTABLE NUCLEAR ANTIBODY: SSB (La) (ENA) Antibody, IgG: <0.2 AI (ref 0.0–0.9)

## 2021-06-21 LAB — BLOOD GAS, ARTERIAL
Acid-Base Excess: 0.3 mmol/L (ref 0.0–2.0)
Bicarbonate: 24.6 mmol/L (ref 20.0–28.0)
Drawn by: 56353
FIO2: 50 %
MECHVT: 420 mL
O2 Saturation: 99.5 %
Patient temperature: 37
RATE: 24 resp/min
pCO2 arterial: 38 mmHg (ref 32–48)
pH, Arterial: 7.42 (ref 7.35–7.45)
pO2, Arterial: 185 mmHg — ABNORMAL HIGH (ref 83–108)

## 2021-06-21 LAB — LACTIC ACID, PLASMA: Lactic Acid, Venous: 1.4 mmol/L (ref 0.5–1.9)

## 2021-06-21 LAB — PNEUMOCYSTIS JIROVECI SMEAR BY DFA: Pneumocystis jiroveci Ag: NEGATIVE

## 2021-06-21 LAB — TRIGLYCERIDES: Triglycerides: 86 mg/dL (ref <150)

## 2021-06-21 LAB — CULTURE, RESPIRATORY W GRAM STAIN

## 2021-06-21 LAB — GLOMERULAR BASEMENT MEMBRANE ANTIBODIES: GBM Ab: <0.2 units (ref 0.0–0.9)

## 2021-06-21 LAB — ANCA TITERS
Atypical P-ANCA titer: <1:20 {titer}
C-ANCA: <1:20 {titer}
P-ANCA: <1:20 {titer}

## 2021-06-21 LAB — ANA: Anti Nuclear Antibody (ANA): NEGATIVE

## 2021-06-21 LAB — TROPONIN I (HIGH SENSITIVITY): Troponin I (High Sensitivity): 16 ng/L (ref <18)

## 2021-06-21 LAB — ANTI-SCLERODERMA ANTIBODY: Scleroderma (Scl-70) (ENA) Antibody, IgG: <0.2 AI (ref 0.0–0.9)

## 2021-06-21 LAB — BRAIN NATRIURETIC PEPTIDE: B Natriuretic Peptide: 514.9 pg/mL — ABNORMAL HIGH (ref 0.0–100.0)

## 2021-06-21 LAB — ANTI-DNA ANTIBODY, DOUBLE-STRANDED: ds DNA Ab: <1 IU/mL (ref 0–9)

## 2021-06-21 LAB — SJOGRENS SYNDROME-A EXTRACTABLE NUCLEAR ANTIBODY: SSA (Ro) (ENA) Antibody, IgG: <0.2 AI (ref 0.0–0.9)

## 2021-06-21 MED ORDER — SODIUM CHLORIDE 1 G PO TABS
1.0000 g | ORAL_TABLET | Freq: Two times a day (BID) | ORAL | Status: DC
  Administered 2021-06-21: 1 g
  Filled 2021-06-21 (×2): qty 1

## 2021-06-21 MED ORDER — GUAIFENESIN 100 MG/5ML PO LIQD: 5.0000 mL | ORAL | Status: DC | PRN

## 2021-06-21 MED ORDER — EZETIMIBE 10 MG PO TABS: 10.0000 mg | ORAL_TABLET | Freq: Every day | ORAL | Status: DC

## 2021-06-21 MED ORDER — ORAL CARE MOUTH RINSE
15.0000 mL | Freq: Two times a day (BID) | OROMUCOSAL | Status: DC
  Administered 2021-06-21 – 2021-06-24 (×7): 15 mL via OROMUCOSAL

## 2021-06-21 MED ORDER — POLYETHYLENE GLYCOL 3350 17 G PO PACK
17.0000 g | PACK | Freq: Every day | ORAL | Status: DC
  Filled 2021-06-21 (×4): qty 1

## 2021-06-21 MED ORDER — CLOPIDOGREL BISULFATE 75 MG PO TABS
75.0000 mg | ORAL_TABLET | Freq: Every day | ORAL | Status: DC
  Administered 2021-06-22 – 2021-06-28 (×7): 75 mg via ORAL
  Filled 2021-06-21 (×7): qty 1

## 2021-06-21 MED ORDER — ASPIRIN EC 81 MG PO TBEC
81.0000 mg | DELAYED_RELEASE_TABLET | Freq: Every day | ORAL | Status: DC
  Administered 2021-06-22 – 2021-06-28 (×7): 81 mg via ORAL
  Filled 2021-06-21 (×7): qty 1

## 2021-06-21 MED ORDER — FUROSEMIDE 10 MG/ML IJ SOLN
40.0000 mg | Freq: Once | INTRAMUSCULAR | Status: AC
  Administered 2021-06-21: 40 mg via INTRAVENOUS
  Filled 2021-06-21: qty 4

## 2021-06-21 MED ORDER — ASPIRIN 81 MG PO CHEW
81.0000 mg | CHEWABLE_TABLET | Freq: Every day | ORAL | Status: DC
Start: 2021-06-22 — End: 2021-06-21

## 2021-06-21 MED ORDER — ASPIRIN 81 MG PO CHEW
81.0000 mg | CHEWABLE_TABLET | Freq: Every day | ORAL | Status: DC
Start: 2021-06-21 — End: 2021-06-21
  Administered 2021-06-21: 81 mg
  Filled 2021-06-21: qty 1

## 2021-06-21 MED ORDER — ORAL CARE MOUTH RINSE
15.0000 mL | OROMUCOSAL | Status: DC
  Administered 2021-06-21 (×5): 15 mL via OROMUCOSAL

## 2021-06-21 MED ORDER — ROSUVASTATIN CALCIUM 20 MG PO TABS: 40.0000 mg | ORAL_TABLET | Freq: Every day | ORAL | Status: DC

## 2021-06-21 MED ORDER — CLOPIDOGREL BISULFATE 75 MG PO TABS
75.0000 mg | ORAL_TABLET | Freq: Every day | ORAL | Status: DC
  Administered 2021-06-21: 75 mg
  Filled 2021-06-21: qty 1

## 2021-06-21 MED ORDER — MAGNESIUM SULFATE 2 GM/50ML IV SOLN: 2.0000 g | Freq: Once | INTRAVENOUS | Status: DC

## 2021-06-21 MED ORDER — DOCUSATE SODIUM 100 MG PO CAPS
100.0000 mg | ORAL_CAPSULE | Freq: Two times a day (BID) | ORAL | Status: DC
  Filled 2021-06-21 (×8): qty 1

## 2021-06-21 MED ORDER — ROSUVASTATIN CALCIUM 20 MG PO TABS
40.0000 mg | ORAL_TABLET | Freq: Every day | ORAL | Status: DC
  Administered 2021-06-21: 40 mg via ORAL
  Filled 2021-06-21: qty 2

## 2021-06-21 MED ORDER — ACETAMINOPHEN 500 MG PO TABS: 1000.0000 mg | ORAL_TABLET | Freq: Four times a day (QID) | ORAL | Status: DC | PRN

## 2021-06-21 MED ORDER — SODIUM CHLORIDE 1 G PO TABS
1.0000 g | ORAL_TABLET | Freq: Two times a day (BID) | ORAL | Status: DC
  Administered 2021-06-22 – 2021-06-28 (×13): 1 g via ORAL
  Filled 2021-06-21 (×14): qty 1

## 2021-06-21 MED ORDER — EZETIMIBE 10 MG PO TABS
10.0000 mg | ORAL_TABLET | Freq: Every day | ORAL | Status: DC
  Administered 2021-06-21 – 2021-06-27 (×7): 10 mg via ORAL
  Filled 2021-06-21 (×7): qty 1

## 2021-06-21 MED ORDER — CHLORHEXIDINE GLUCONATE 0.12% ORAL RINSE (MEDLINE KIT)
15.0000 mL | Freq: Two times a day (BID) | OROMUCOSAL | Status: DC
  Administered 2021-06-21: 15 mL via OROMUCOSAL

## 2021-06-21 NOTE — Progress Notes (Signed)
BLE venous duplex has been completed.   ? ? ?Results can be found under chart review under CV PROC. ?06/21/2021 11:21 AM ?Lea Baine RVT, RDMS ? ?

## 2021-06-21 NOTE — Progress Notes (Addendum)
?Cardiology Progress Note  ?Patient ID: Roberto Ponce ?MRN: [id number] ?DOB: February 22, 1950 ?Date of Encounter: 06/21/2021 ? ?Primary Cardiologist: Mertie Moores, MD ? ?Subjective  ? ?Chief Complaint: Intubated. Following commands.  ? ?HPI: Intubated yesterday for respiratory failure.  Working diagnosis pneumonia.  Exam inconsistent with decompensated heart failure.  Ejection fraction is newly reduced.  Appears to be alert and awake on the ventilator.  Following commands. ? ?ROS:  ?All other ROS reviewed and negative. Pertinent positives noted in the HPI.    ? ?Inpatient Medications  ?Scheduled Meds: ? aspirin  81 mg Per Tube Daily  ? brimonidine  1 drop Left Eye BID  ? chlorhexidine gluconate (MEDLINE KIT)  15 mL Mouth Rinse BID  ? Chlorhexidine Gluconate Cloth  6 each Topical Daily  ? clopidogrel  75 mg Per Tube Q breakfast  ? docusate  100 mg Per Tube BID  ? dorzolamide-timolol  1 drop Both Eyes BID  ? enoxaparin (LOVENOX) injection  40 mg Subcutaneous Q24H  ? ezetimibe  10 mg Per Tube QHS  ? hydrocortisone sod succinate (SOLU-CORTEF) inj  50 mg Intravenous Q6H  ? insulin aspart  0-15 Units Subcutaneous TID WC  ? latanoprost  1 drop Both Eyes QHS  ? mouth rinse  15 mL Mouth Rinse 10 times per day  ? pantoprazole (PROTONIX) IV  40 mg Intravenous Daily  ? polyethylene glycol  17 g Per Tube Daily  ? rosuvastatin  40 mg Per Tube QHS  ? sodium chloride flush  10-40 mL Intracatheter Q12H  ? sodium chloride flush  3 mL Intravenous Q12H  ? sodium chloride  1 g Per Tube BID WC  ? ?Continuous Infusions: ? sodium chloride Stopped (06/20/21 0222)  ? sodium chloride Stopped (06/20/21 2019)  ? azithromycin Stopped (06/21/21 0340)  ? ceFEPime (MAXIPIME) IV Stopped (06/21/21 3005)  ? dextrose 5 % and 0.9% NaCl 10 mL/hr at 06/21/21 1115  ? norepinephrine (LEVOPHED) Adult infusion 5 mcg/min (06/21/21 0510)  ? ?PRN Meds: ?sodium chloride, acetaminophen, albuterol, nitroGLYCERIN, ondansetron **OR** ondansetron (ZOFRAN) IV, sodium  chloride flush  ? ?Vital Signs  ? ?Vitals:  ? 06/21/21 0600 06/21/21 0809 06/21/21 1100 06/21/21 1157  ?BP:      ?Pulse: 64     ?Resp: 14     ?Temp:  97.7 ?F (36.5 ?C)    ?TempSrc:  Oral    ?SpO2: 98%  97% 92%  ?Weight:      ?Height:      ? ? ?Intake/Output Summary (Last 24 hours) at 06/21/2021 1207 ?Last data filed at 06/21/2021 0907 ?Gross per 24 hour  ?Intake 2206.18 ml  ?Output 2530 ml  ?Net -323.82 ml  ? ? ?  06/21/2021  ?  5:00 AM 06/19/2021  ?  5:00 AM 06/18/2021  ?  1:59 PM  ?Last 3 Weights  ?Weight (lbs) 177 lb 14.6 oz 179 lb 6.4 oz 156 lb  ?Weight (kg) 80.7 kg 81.375 kg 70.761 kg  ?   ? ?Telemetry  ?Overnight telemetry shows SB 50s, which I personally reviewed.  ? ? ?Physical Exam  ? ?Vitals:  ? 06/21/21 0600 06/21/21 0809 06/21/21 1100 06/21/21 1157  ?BP:      ?Pulse: 64     ?Resp: 14     ?Temp:  97.7 ?F (36.5 ?C)    ?TempSrc:  Oral    ?SpO2: 98%  97% 92%  ?Weight:      ?Height:      ?  ?Intake/Output Summary (Last 24 hours) at  06/21/2021 1207 ?Last data filed at 06/21/2021 0907 ?Gross per 24 hour  ?Intake 2206.18 ml  ?Output 2530 ml  ?Net -323.82 ml  ?  ? ?  06/21/2021  ?  5:00 AM 06/19/2021  ?  5:00 AM 06/18/2021  ?  1:59 PM  ?Last 3 Weights  ?Weight (lbs) 177 lb 14.6 oz 179 lb 6.4 oz 156 lb  ?Weight (kg) 80.7 kg 81.375 kg 70.761 kg  ?  Body mass index is 26.27 kg/m?.  ?General: Intubated on the vent ?Head: Atraumatic, normal size  ?Eyes: PEERLA, EOMI  ?Neck: Supple, no JVD ?Endocrine: No thryomegaly ?Cardiac: Normal S1, S2; RRR; no murmurs, rubs, or gallops ?Lungs: Diminished breath sounds bilaterally ?Abd: Soft, nontender, no hepatomegaly  ?Ext: No edema, pulses 2+ ?Musculoskeletal: No deformities, BUE and BLE strength normal and equal ?Skin: Warm and dry, no rashes   ?Neuro: Awake, alert, following commands ? ?Labs  ?High Sensitivity Troponin:   ?Recent Labs  ?Lab 06/20/21 ?1723  ?TROPONINIHS 61*  ?   ?Cardiac EnzymesNo results for input(s): TROPONINI in the last 168 hours. No results for input(s): TROPIPOC in the  last 168 hours.  ?Chemistry ?Recent Labs  ?Lab 06/18/21 ?1457 06/18/21 ?1735 06/20/21 ?0409 06/20/21 ?1539 06/20/21 ?2002 06/21/21 ?0358  ?NA 120*   < > 120* 122* 125* 127*  ?K 3.7   < > 3.7  --  3.4* 4.1  ?CL 89*   < > 87*  --  92* 97*  ?CO2 23   < > 24  --  22 24  ?GLUCOSE 128*   < > 111*  --  145* 193*  ?BUN 8   < > 7*  --  7* 10  ?CREATININE 0.63   < > 0.59*  --  0.47* 0.53*  ?CALCIUM 7.9*   < > 7.6*  --  7.8* 7.9*  ?PROT 6.5  --   --   --  6.0* 5.8*  ?ALBUMIN 3.0*  --   --   --  2.4* 2.3*  ?AST 96*  --   --   --  195* 152*  ?ALT 104*  --   --   --  186* 171*  ?ALKPHOS 61  --   --   --  82 84  ?BILITOT 0.5  --   --   --  1.6* 0.8  ?GFRNONAA >60   < > >60  --  >60 >60  ?ANIONGAP 8   < > 9  --  11 6  ? < > = values in this interval not displayed.  ?  ?Hematology ?Recent Labs  ?Lab 06/20/21 ?0409 06/20/21 ?2002 06/21/21 ?0358  ?WBC 14.3* 19.2* 16.4*  ?RBC 3.50* 3.49* 3.44*  ?HGB 10.5* 10.6* 10.2*  ?HCT 30.1* 29.5* 29.4*  ?MCV 86.0 84.5 85.5  ?MCH 30.0 30.4 29.7  ?MCHC 34.9 35.9 34.7  ?RDW 13.8 14.1 13.9  ?PLT 191 282 271  ? ?BNP ?Recent Labs  ?Lab 06/20/21 ?1723 06/21/21 ?0358  ?BNP 667.6* 514.9*  ?  ?DDimer No results for input(s): DDIMER in the last 168 hours.  ? ?Radiology  ?CT CHEST WO CONTRAST ? ?Result Date: 06/20/2021 ?CLINICAL DATA:  Pneumonia, complication suspected. History coronary artery disease post MI and CABG. Recent diagnosis of testicular seminoma post orchiectomy. Presents with cough and shortness of breath, admitted for community-acquired pneumonia EXAM: CT CHEST WITHOUT CONTRAST TECHNIQUE: Multidetector CT imaging of the chest was performed following the standard protocol without IV contrast. RADIATION DOSE REDUCTION: This exam was performed according to  the departmental dose-optimization program which includes automated exposure control, adjustment of the mA and/or kV according to patient size and/or use of iterative reconstruction technique. COMPARISON:  03/16/2021 FINDINGS: Cardiovascular:  Atherosclerotic calcifications aorta, coronary arteries, proximal great vessels. Postsurgical changes of CABG. Enlargement of cardiac chambers. No pericardial effusion. Aorta normal caliber. Mediastinum/Nodes: Esophagus unremarkable. Base of cervical region normal appearance. Enlarged mediastinal lymph nodes new since previous exam including 14 mm RIGHT paratracheal node image 42, 13 mm subcarinal node image 62, 12 mm node adjacent to RIGHT mainstem bronchus image 55. Lungs/Pleura: Small BILATERAL pleural effusions. Calcified granuloma RIGHT lower lobe. Extensive airspace consolidation bilaterally greater on LEFT. Central peribronchial thickening. No mass or pneumothorax. Upper Abdomen: Visualized upper abdomen unremarkable Musculoskeletal: No osseous abnormalities. IMPRESSION: Extensive BILATERAL airspace consolidation greater on LEFT, consistent with pneumonia. Small BILATERAL pleural effusions. Nonspecific mediastinal adenopathy, likely reactive in the setting of extensive infiltrates but follow-up until resolution recommended in light of history of testicular cancer. Extensive atherosclerotic disease changes including coronary arteries with postsurgical changes of CABG. Aortic Atherosclerosis (ICD10-I70.0). Electronically Signed   By: Lavonia Dana M.D.   On: 06/20/2021 13:39  ? ?DG CHEST PORT 1 VIEW ? ?Result Date: 06/21/2021 ?CLINICAL DATA:  Central line placement EXAM: PORTABLE CHEST 1 VIEW COMPARISON:  Film from the previous day. FINDINGS: Endotracheal tube is noted just above the carina. Gastric catheter extends into the stomach. Right-sided PICC is seen at the cavoatrial junction. Cardiac shadow is stable. Postsurgical changes are again noted. Bilateral airspace opacities are noted left greater than right stable from the prior exam. IMPRESSION: New right-sided PICC in satisfactory position. The remainder of the study is stable. Electronically Signed   By: Inez Catalina M.D.   On: 06/21/2021 00:23  ? ?DG CHEST  PORT 1 VIEW ? ?Result Date: 06/20/2021 ?CLINICAL DATA:  ARDS. EXAM: PORTABLE CHEST 1 VIEW COMPARISON:  Chest x-ray 06/18/2021. FINDINGS: Endotracheal tube tip is 2 cm above the carina. Enteric tube extends below t

## 2021-06-21 NOTE — Plan of Care (Signed)
  Problem: Coping: Goal: Level of anxiety will decrease Outcome: Progressing   Problem: Elimination: Goal: Will not experience complications related to bowel motility Outcome: Progressing Goal: Will not experience complications related to urinary retention Outcome: Progressing   Problem: Pain Managment: Goal: General experience of comfort will improve Outcome: Progressing   Problem: Skin Integrity: Goal: Risk for impaired skin integrity will decrease Outcome: Progressing   

## 2021-06-21 NOTE — Progress Notes (Signed)
Received call from [facility] microbiology lab stating that they cannot do a respiratory panel from a B.A.L. swab. Technician states it has to be a nasopharyngeal swab. Therefore, they will credit the requested test. If respiratory panel desired, it will need to be reordered and recollected as nasopharyngeal swab. ?

## 2021-06-21 NOTE — TOC Initial Note (Addendum)
Transition of Care (TOC) - Initial/Assessment Note  ? ? ?Patient Details  ?Name: Roberto Ponce ?MRN: [id number] ?Date of Birth: 1950-12-19 ? ?Transition of Care [facility]) CM/SW Contact:    ?Dessa Phi, RN ?Phone Number: ?06/21/2021, 1:18 PM ?Noted extubated, 02,NPO;will await PT recc.               ? ? ?Expected Discharge Plan:  (TBD) ?Barriers to Discharge: Continued Medical Work up ? ? ?Patient Goals and CMS Choice ?Patient states their goals for this hospitalization and ongoing recovery are:: Home ?CMS Medicare.gov Compare Post Acute Care list provided to:: Patient Represenative (must comment) ([address] spouse) ?  ? ?Expected Discharge Plan and Services ?Expected Discharge Plan:  (TBD) ?  ?Discharge Planning Services: CM Consult ?  ?Living arrangements for the past 2 months: [place] ?                ?  ?  ?  ?  ?  ?  ?  ?  ?  ?  ? ?Prior Living Arrangements/Services ?Living arrangements for the past 2 months: [place] ?Lives with:: Spouse ?Patient language and need for interpreter reviewed:: Yes ?Do you feel safe going back to the place where you live?: Yes      ?Need for Family Participation in Patient Care: Yes (Comment) ?Care giver support system in place?: Yes (comment) ?  ?Criminal Activity/Legal Involvement Pertinent to Current Situation/Hospitalization: No - Comment as needed ? ?Activities of Daily Living ?Home Assistive Devices/Equipment: Eyeglasses ?ADL Screening (condition at time of admission) ?Patient's cognitive ability adequate to safely complete daily activities?: Yes ?Is the patient deaf or have difficulty hearing?: No ?Does the patient have difficulty seeing, even when wearing glasses/contacts?: No ?Does the patient have difficulty concentrating, remembering, or making decisions?: No ?Patient able to express need for assistance with ADLs?: Yes ?Does the patient have difficulty dressing or bathing?: No ?Independently performs ADLs?: Yes (appropriate for developmental age) ?Does  the patient have difficulty walking or climbing stairs?: No ?Weakness of Legs: None ?Weakness of Arms/Hands: None ? ?Permission Sought/Granted ?Permission sought to share information with : Case Manager ?Permission granted to share information with : Yes, Verbal Permission Granted ? Share Information with NAME: Case Manager ?   ?   ?   ? ?Emotional Assessment ?Appearance:: Appears stated age ?Attitude/Demeanor/Rapport: Gracious ?Affect (typically observed): Accepting ?Orientation: : Oriented to Self, Oriented to  Time, Oriented to Place, Oriented to Situation ?Alcohol / Substance Use: Not Applicable ?Psych Involvement: No (comment) ? ?Admission diagnosis:  Hyponatremia [E87.1] ?CAP (community acquired pneumonia) [J18.9] ?Community acquired pneumonia of left lung, unspecified part of lung [J18.9] ?Patient Active Problem List  ? Diagnosis Date Noted  ? Acute systolic (congestive) heart failure ([place]) 06/21/2021  ? Severe sepsis ([place]) 06/20/2021  ? Acute respiratory failure with hypoxia ([place]) 06/20/2021  ? ARDS (adult respiratory distress syndrome) ([place])   ? CAP (community acquired pneumonia) 06/18/2021  ? Acute hyponatremia 06/18/2021  ? Testis cancer ([place]) 05/18/2021  ? S/P CABG x 2 02/15/2021  ? Non-rheumatic mitral regurgitation 02/14/2021  ? Mitral regurgitation 07/22/2020  ? Coronary artery disease involving native coronary artery of native heart without angina pectoris 07/09/2016  ? CAD (coronary artery disease) of artery bypass graft 05/27/2013  ? Mixed hyperlipidemia 05/27/2013  ? Glaucoma 05/27/2013  ? Acute MI anterior wall first episode care [facility]) 05/27/2013  ? LVH (left ventricular hypertrophy) 05/27/2013  ? Venous stasis 05/27/2013  ? Asthmatic bronchitis 05/27/2013  ? Gallstones 05/27/2013  ? ?  PCP:  Kristen Loader, FNP ?Pharmacy:   ?[facility] [place] ?[place] ?[address] ?[place] [address] ?Phone: [phone] Fax:  [phone] ? ?[facility] - [place], [place] - [address] ?[address] ?[place] [address] ?Phone: [phone] Fax: [phone] ? ?[facility] ?[address] ?[place] [address] ?Phone: [phone] Fax: [phone] ? ? ? ? ?Social Determinants of Health (SDOH) Interventions ?  ? ?Readmission Risk Interventions ?   ? View : No data to display.  ?  ?  ?  ? ? ? ?

## 2021-06-21 NOTE — Progress Notes (Signed)
Nutrition Brief Note ? ?Patient identified for new vent and consult for tube feeding initiation and management. ? ?Patient discussed in rounds this AM. He was subsequently extubated at 1156. OGT removed at that time. Patient remains NPO s/p extubation. ? ?Wt Readings from Last 15 Encounters:  ?06/21/21 80.7 kg  ?05/18/21 74.5 kg  ?04/19/21 74.8 kg  ?04/13/21 74.8 kg  ?03/13/21 76.2 kg  ?02/15/21 76.1 kg  ?01/26/21 76.8 kg  ?09/02/20 75.8 kg  ?07/22/20 77.6 kg  ?07/13/19 77.7 kg  ?07/09/18 77.1 kg  ?07/09/17 77.1 kg  ?07/09/16 77.3 kg  ?07/05/15 80.6 kg  ?06/21/14 79.6 kg  ? ? ?Body mass index is 26.27 kg/m?Marland Kitchen Patient meets criteria for overweight status based on current BMI. Skin WDL.  ? ?Patient does not screen for any other nutrition-related needs at this time.  ? ?Labs and medications reviewed.  ? ?No nutrition interventions warranted at this time. If nutrition issues arise, please re-consult RD.  ? ? ? ? ?Jarome Matin, MS, RD, LDN ?Registered Dietitian II ?Inpatient Clinical Nutrition ?RD pager # and on-call/weekend pager # available in [place] ' ? ? ?

## 2021-06-21 NOTE — Progress Notes (Addendum)
? ?NAME:  Roberto Ponce, MRN:  [id number], DOB:  29-Apr-1950, LOS: 2 ?ADMISSION DATE:  06/18/2021, CONSULTATION DATE:  5/2 ?REFERRING MD:  Dr Doristine Bosworth, CHIEF COMPLAINT:  Hypoxia  ? ?History of Present Illness:  ?71 year old male with PMH as below, which is significant for CAD s/p STEMI in the 1980s. Recent course complicated by mitral valve prolapse with worsening regurgitation noted on TTE, and TEE. Classified as severe. Workup has included a left/right heart cath, which identified a 95% LCx occlusion treated with PCI. CVTS workup is pending once he is no longer requiring anti-platelet agents. Recent course also complicated by diagnosis of testicular cancer in march of this year treated with right radical inguinal orchiectomy. No other therapy indicated at this time.  ? ?He presented to [facility] ED 5/1 with complaints of shortness of breath. Symptoms started about one week prior with mild cough and SOB. Denied production with cough or sick contacts. Highest temp at home was 99.4. Also described orthopnea (normally sleeps with 1-2 pillows and needed to sleep upright on couch). He presented to urgent care 4/27 and was diagnosed as viral pneumonia and was discharged. Symptoms worsened causing him to present on 5/1. He was admitted to the hospitalists on treatment for CAP including supplemental oxygen. On 5/2 imaging looked worse there were increasing oxygen requirements. He was started on BiPAP for work of breathing. PCCM was consulted.  ? ?Pertinent  Medical History  ? has a past medical history of Asthma, Coronary artery disease, Glaucoma, History of gallstones, Hyperlipidemia, LVH (left ventricular hypertrophy), MI (myocardial infarction) ([place]), MI, old, Mild aortic sclerosis, Mitral regurgitation, Mixed dyslipidemia, and Venous stasis. ? ? ?Significant Hospital Events: ?Including procedures, antibiotic start and stop dates in addition to other pertinent events   ?5/1 admitted ?5/2 PCCM consulted for worsening  hypoxia. Intubated. Bronch'd. ILD markers sent. Systemic steroids added. Abx widened to cefepime, azith and vanc RUE PICC placed  ?5/3 sed rate 67, ANA and Anti-DNA ab negative, Sjogren syndrome A and B negative,  ?Antiscleroderma neg. RVP negative. Urine strep and legionella antigens negative weaning on vent getting lasix ?Interim History / Subjective:  ?Awake. Weaning looks better  ?Objective   ?Blood pressure (Abnormal) 135/59, pulse 64, temperature 97.6 ?F (36.4 ?C), temperature source Axillary, resp. rate 14, height '5\' 9"'$  (1.753 m), weight 80.7 kg, SpO2 98 %. ?   ?Vent Mode: CPAP;PSV ?FiO2 (%):  [40 %-100 %] 40 % ?Set Rate:  [24 bmp-28 bmp] 24 bmp ?Vt Set:  [420 mL-560 mL] 420 mL ?PEEP:  [8 cmH20-10 cmH20] 8 cmH20 ?Pressure Support:  [5 cmH20] 5 cmH20 ?Plateau Pressure:  [22 IEP32-95 cmH20] 22 cmH20  ? ?Intake/Output Summary (Last 24 hours) at 06/21/2021 1119 ?Last data filed at 06/21/2021 0907 ?Gross per 24 hour  ?Intake 2206.18 ml  ?Output 2530 ml  ?Net -323.82 ml  ? ?Filed Weights  ? 06/18/21 1359 06/19/21 0500 06/21/21 0500  ?Weight: 70.8 kg 81.4 kg 80.7 kg  ? ? ?Examination: ?General 71 year old wm resting on vent. Awake and in no distress ?HENT NCAT no JVD orally intubated.  ?Pulm cl w/ occ rhonchi. No accessory use. Excellent F/Vt on PSV 5 VTs 500s no accessory use ?Post intubation CXR w/ L>R airspace disease. Perhaps some improvement in aeration  ?Card rrr w/ systolic HM  ?Abd soft ?Ext warm  ?Neuro awake and approp. No focal motor def  ?Gu cl yellow  ? ? ?Resolved Hospital Problem list   ? ? ?Assessment & Plan:  ?  Principal Problem: ?  CAP (community acquired pneumonia) ?Active Problems: ?  CAD (coronary artery disease) of artery bypass graft ?  Acute hyponatremia ?  Severe sepsis ([place]) ?  Acute respiratory failure with hypoxia ([place]) ?  ARDS (adult respiratory distress syndrome) ([place]) ?  Acute systolic (congestive) heart failure ([facility]) ? ? ?Acute hypoxemic respiratory failure: 2/2 PNA (NOS) w/ ARDS +/-  pulmonary edema  ?Plan ?Day 2 cefepime and vanc, day 4 azith of 5, can dc vanc MRSA PCR neg. This is not MRSA PNA  ?Day 2 of 5 systemic steroids then plan to taper over 10-14 d ?F/u cultures ?F/u remaining pending ILD markers ?Daily assessment for lasix (one time today) ?Daily SBT (assessing for extubation) ?Sent RVP via resp culture on extubation  ?VAP bundle  ? ?Severe mitral stenosis ?CAD s/p CABG in 1980s. S/p PCI to LCx recently by Dr. Burt Knack.  ?Plan ?Cont DAPT ?Cont tele  ?Cont zetia and statin  ? ?Acute systolic HF. EF 40-45% ? Septic CM. Has Mild LVH RV fxn mildly reduced.  ?Grade II diastolic dysfn ?Plan ?Lasix today  ?Tele ?Holding GDMT given hypotension ? ?Shock. Favor drug related hypotension over sepsis. He does not appear toxic ?Plan ?Wean fent to off ?Wean norepi ?Hold antihypertensives ? ?Hyponatremia w/ diagnostic eval c/w SIADH remaining stable ~ 120 since admission despite salt tabs.  ?- bnp elevated. Na improving.  ?Plan ?Cont salt tabs  ?Cont to trend Na q 6 ?Avoid volume excess  ?KVO IVF ? ?Hyperglycemia ?Plan ?Ssi  ?Best Practice (right click and "Reselect all SmartList Selections" daily)  ? ?Diet/type: Regular consistency (see orders) ?DVT prophylaxis: LMWH ?GI prophylaxis: PPI ?Lines: N/A ?Foley:  N/A ?Code Status:  full code ?Last date of multidisciplinary goals of care discussion [ Discussed code status with patient and family 5/2. Full code.] ? ? ?Critical care time: 32 min  ?  ?Erick Colace ACNP-BC ?[place] ?Pager # [phone] OR # [phone] if no answer ? ? ? ? ? ? ?

## 2021-06-21 NOTE — Procedures (Signed)
Extubation Procedure Note ? ?Patient Details:   ?Name: Roberto Ponce ?DOB: February 16, 1951 ?MRN: [id number] ?  ?Airway Documentation:  ?  ?Vent end date: 06/21/21 Vent end time: 1156  ? ?Evaluation ? O2 sats: stable throughout ?Complications: No apparent complications ?Patient did tolerate procedure well. ?Bilateral Breath Sounds: Diminished ?  ?Yes ? ?Johnette Abraham ?06/21/2021, 11:56 AM ? ?

## 2021-06-22 ENCOUNTER — Inpatient Hospital Stay (HOSPITAL_COMMUNITY): Payer: PPO

## 2021-06-22 ENCOUNTER — Telehealth: Payer: Self-pay | Admitting: Internal Medicine

## 2021-06-22 ENCOUNTER — Encounter (HOSPITAL_COMMUNITY): Payer: Self-pay | Admitting: Family Medicine

## 2021-06-22 DIAGNOSIS — J189 Pneumonia, unspecified organism: Secondary | ICD-10-CM | POA: Diagnosis not present

## 2021-06-22 DIAGNOSIS — R579 Shock, unspecified: Secondary | ICD-10-CM

## 2021-06-22 DIAGNOSIS — J9601 Acute respiratory failure with hypoxia: Secondary | ICD-10-CM | POA: Diagnosis not present

## 2021-06-22 DIAGNOSIS — J80 Acute respiratory distress syndrome: Secondary | ICD-10-CM | POA: Diagnosis not present

## 2021-06-22 DIAGNOSIS — I952 Hypotension due to drugs: Secondary | ICD-10-CM

## 2021-06-22 DIAGNOSIS — I5021 Acute systolic (congestive) heart failure: Secondary | ICD-10-CM | POA: Diagnosis not present

## 2021-06-22 DIAGNOSIS — I25810 Atherosclerosis of coronary artery bypass graft(s) without angina pectoris: Secondary | ICD-10-CM | POA: Diagnosis not present

## 2021-06-22 HISTORY — DX: Shock, unspecified: R57.9

## 2021-06-22 HISTORY — DX: Hypotension due to drugs: I95.2

## 2021-06-22 LAB — COMPREHENSIVE METABOLIC PANEL
ALT: 297 U/L — ABNORMAL HIGH (ref 0–44)
AST: 321 U/L — ABNORMAL HIGH (ref 15–41)
Albumin: 2 g/dL — ABNORMAL LOW (ref 3.5–5.0)
Alkaline Phosphatase: 83 U/L (ref 38–126)
Anion gap: 6 (ref 5–15)
BUN: 13 mg/dL (ref 8–23)
CO2: 27 mmol/L (ref 22–32)
Calcium: 7.6 mg/dL — ABNORMAL LOW (ref 8.9–10.3)
Chloride: 99 mmol/L (ref 98–111)
Creatinine, Ser: 0.54 mg/dL — ABNORMAL LOW (ref 0.61–1.24)
GFR, Estimated: >60 mL/min (ref >60)
Glucose, Bld: 143 mg/dL — ABNORMAL HIGH (ref 70–99)
Potassium: 3.5 mmol/L (ref 3.5–5.1)
Sodium: 132 mmol/L — ABNORMAL LOW (ref 135–145)
Total Bilirubin: 0.7 mg/dL (ref 0.3–1.2)
Total Protein: 5.3 g/dL — ABNORMAL LOW (ref 6.5–8.1)

## 2021-06-22 LAB — GLUCOSE, CAPILLARY
Glucose-Capillary: 128 mg/dL — ABNORMAL HIGH (ref 70–99)
Glucose-Capillary: 116 mg/dL — ABNORMAL HIGH (ref 70–99)
Glucose-Capillary: 142 mg/dL — ABNORMAL HIGH (ref 70–99)
Glucose-Capillary: 163 mg/dL — ABNORMAL HIGH (ref 70–99)
Glucose-Capillary: 122 mg/dL — ABNORMAL HIGH (ref 70–99)
Glucose-Capillary: 144 mg/dL — ABNORMAL HIGH (ref 70–99)
Glucose-Capillary: 148 mg/dL — ABNORMAL HIGH (ref 70–99)

## 2021-06-22 LAB — CBC WITH DIFFERENTIAL/PLATELET
Abs Immature Granulocytes: 0.13 10*3/uL — ABNORMAL HIGH (ref 0.00–0.07)
Basophils Absolute: 0 10*3/uL (ref 0.0–0.1)
Basophils Relative: 0 %
Eosinophils Absolute: 0 10*3/uL (ref 0.0–0.5)
Eosinophils Relative: 0 %
HCT: 27.8 % — ABNORMAL LOW (ref 39.0–52.0)
Hemoglobin: 9.5 g/dL — ABNORMAL LOW (ref 13.0–17.0)
Immature Granulocytes: 1 %
Lymphocytes Relative: 6 %
Lymphs Abs: 1 10*3/uL (ref 0.7–4.0)
MCH: 30.4 pg (ref 26.0–34.0)
MCHC: 34.2 g/dL (ref 30.0–36.0)
MCV: 88.8 fL (ref 80.0–100.0)
Monocytes Absolute: 0.9 10*3/uL (ref 0.1–1.0)
Monocytes Relative: 5 %
Neutro Abs: 15.6 10*3/uL — ABNORMAL HIGH (ref 1.7–7.7)
Neutrophils Relative %: 88 %
Platelets: 259 10*3/uL (ref 150–400)
RBC: 3.13 MIL/uL — ABNORMAL LOW (ref 4.22–5.81)
RDW: 14.4 % (ref 11.5–15.5)
WBC: 17.7 10*3/uL — ABNORMAL HIGH (ref 4.0–10.5)
nRBC: 0 % (ref 0.0–0.2)

## 2021-06-22 LAB — MAGNESIUM: Magnesium: 2.3 mg/dL (ref 1.7–2.4)

## 2021-06-22 LAB — CYTOLOGY - NON PAP

## 2021-06-22 MED ORDER — POTASSIUM CHLORIDE CRYS ER 20 MEQ PO TBCR
40.0000 meq | EXTENDED_RELEASE_TABLET | ORAL | Status: AC
  Administered 2021-06-22 (×2): 40 meq via ORAL
  Filled 2021-06-22 (×2): qty 2

## 2021-06-22 MED ORDER — LOSARTAN POTASSIUM 25 MG PO TABS
12.5000 mg | ORAL_TABLET | Freq: Every day | ORAL | Status: DC
  Administered 2021-06-22 – 2021-06-26 (×5): 12.5 mg via ORAL
  Filled 2021-06-22 (×6): qty 0.5

## 2021-06-22 MED ORDER — CARVEDILOL 3.125 MG PO TABS
3.1250 mg | ORAL_TABLET | Freq: Two times a day (BID) | ORAL | Status: DC
Start: 2021-06-22 — End: 2021-06-28
  Administered 2021-06-22 – 2021-06-28 (×12): 3.125 mg via ORAL
  Filled 2021-06-22 (×12): qty 1

## 2021-06-22 MED ORDER — FUROSEMIDE 10 MG/ML IJ SOLN
40.0000 mg | Freq: Once | INTRAMUSCULAR | Status: AC
  Administered 2021-06-22: 40 mg via INTRAVENOUS
  Filled 2021-06-22: qty 4

## 2021-06-22 MED ORDER — MELATONIN 3 MG PO TABS
3.0000 mg | ORAL_TABLET | Freq: Every day | ORAL | Status: DC
  Administered 2021-06-22 – 2021-06-27 (×6): 3 mg via ORAL
  Filled 2021-06-22 (×6): qty 1

## 2021-06-22 MED ORDER — FUROSEMIDE 10 MG/ML IJ SOLN
80.0000 mg | Freq: Once | INTRAMUSCULAR | Status: AC
  Administered 2021-06-22: 80 mg via INTRAVENOUS
  Filled 2021-06-22: qty 8

## 2021-06-22 MED ORDER — PANTOPRAZOLE SODIUM 40 MG PO TBEC
40.0000 mg | DELAYED_RELEASE_TABLET | Freq: Every day | ORAL | Status: DC
  Administered 2021-06-22 – 2021-06-28 (×7): 40 mg via ORAL
  Filled 2021-06-22 (×7): qty 1

## 2021-06-22 MED ORDER — POTASSIUM CHLORIDE CRYS ER 20 MEQ PO TBCR
40.0000 meq | EXTENDED_RELEASE_TABLET | Freq: Once | ORAL | Status: AC
  Administered 2021-06-22: 40 meq via ORAL
  Filled 2021-06-22: qty 2

## 2021-06-22 NOTE — Progress Notes (Addendum)
? ?NAME:  Roberto Ponce, MRN:  [id number], DOB:  Mar 18, 1950, LOS: 3 ?ADMISSION DATE:  06/18/2021, CONSULTATION DATE:  5/2 ?REFERRING MD:  Dr Doristine Bosworth, CHIEF COMPLAINT:  Hypoxia  ? ?History of Present Illness:  ?71 year old male with PMH as below, which is significant for CAD s/p STEMI in the 1980s. Recent course complicated by mitral valve prolapse with worsening regurgitation noted on TTE, and TEE. Classified as severe. Workup has included a left/right heart cath, which identified a 95% LCx occlusion treated with PCI. CVTS workup is pending once he is no longer requiring anti-platelet agents. Recent course also complicated by diagnosis of testicular cancer in march of this year treated with right radical inguinal orchiectomy. No other therapy indicated at this time.  ? ?He presented to [facility] ED 5/1 with complaints of shortness of breath. Symptoms started about one week prior with mild cough and SOB. Denied production with cough or sick contacts. Highest temp at home was 99.4. Also described orthopnea (normally sleeps with 1-2 pillows and needed to sleep upright on couch). He presented to urgent care 4/27 and was diagnosed as viral pneumonia and was discharged. Symptoms worsened causing him to present on 5/1. He was admitted to the hospitalists on treatment for CAP including supplemental oxygen. On 5/2 imaging looked worse there were increasing oxygen requirements. He was started on BiPAP for work of breathing. PCCM was consulted.  ? ?Pertinent  Medical History  ? has a past medical history of Asthma, Coronary artery disease, Glaucoma, History of gallstones, Hyperlipidemia, LVH (left ventricular hypertrophy), MI (myocardial infarction) ([place]), MI, old, Mild aortic sclerosis, Mitral regurgitation, Mixed dyslipidemia, and Venous stasis. ? ? ?Significant Hospital Events: ?Including procedures, antibiotic start and stop dates in addition to other pertinent events   ?5/1 admitted ?5/2 PCCM consulted for worsening  hypoxia. Intubated. Bronch'd. ILD markers sent. Systemic steroids added. Abx widened to cefepime, azith and vanc RUE PICC placed  ?5/3 sed rate 67, ANA and Anti-DNA ab negative, Sjogren syndrome A and B negative,  ?Antiscleroderma neg. RVP negative. Urine strep and legionella antigens negative weaning on vent getting lasix.  Successfully extubated ?5/4 weaning oxygen but still reporting shortness of breath getting additional Lasix, follow-up respiratory viral panel again negative.  Pneumocystis smear was negative BAL still pending GBM AB- ? ?Interim History / Subjective:  ?Reports short of breath with exertion ?Objective   ?Blood pressure (Abnormal) 144/82, pulse 80, temperature 97.6 ?F (36.4 ?C), temperature source Oral, resp. rate (Abnormal) 28, height _0  (1.753 m), weight 76.9 kg, SpO2 94 %. ?   ?Vent Mode: PSV ?FiO2 (%):  [40 %] 40 % ?PEEP:  [5 cmH20] 5 cmH20 ?Pressure Support:  [5 cmH20] 5 cmH20  ? ?Intake/Output Summary (Last 24 hours) at 06/22/2021 0938 ?Last data filed at 06/22/2021 [phone] ?Gross per 24 hour  ?Intake 655.55 ml  ?Output 3250 ml  ?Net -2594.45 ml  ? ?Filed Weights  ? 06/19/21 0500 06/21/21 0500 06/22/21 0500  ?Weight: 81.4 kg 80.7 kg 76.9 kg  ? ? ?Examination: ? ?General pleasant 71 year old male patient resting in bed he is in no acute distress today ?HEENT normocephalic atraumatic no jugular venous distention ?Pulmonary: Diffuse bilateral rales, currently 4.5 L nasal cannula, no accessory use but does report dyspneic with exertion ?Cardiac: Regular rate and rhythm ?Abdomen: Soft nontender ?Extremity: Warm dry ?Neuro: Awake oriented no focal deficits ?GU: Clear yellow. ? ?Resolved Hospital Problem list   ?Shock ?Drug related hypotension  ? ?Assessment & Plan:  ?Principal Problem: ?  CAP (community acquired pneumonia) ?Active Problems: ?  CAD (coronary artery disease) of artery bypass graft ?  Acute hyponatremia ?  Severe sepsis ([place]) ?  Acute respiratory failure with hypoxia ([place]) ?  ARDS (adult  respiratory distress syndrome) ([place]) ?  Acute systolic (congestive) heart failure (H[facility]) ? ? ?Acute hypoxemic respiratory failure: 2/2 PNA (NOS) w/ ARDS +/- pulmonary edema  ?Successfully extubated 5/3 ?Plan ?Day #3 cefepime, day #5 azithromycin he will complete azithromycin today  ?Day #3 of 5 systemic steroids with plan to initiate taper at day 5 over 10 to 14 days  ?Continuing IV Lasix  ?Get him out of bed  ?Continue spirometry  ?day 2 cefepime and vanc, day 4 azith of 5, can dc vanc MRSA PCR neg. This is not MRSA PNA  ?Day 2 of 5 systemic steroids then plan to taper over 10-14 d ?F/u cultures ?F/u remaining pending ILD markers ?Daily assessment for lasix (one time today) ?Daily SBT (assessing for extubation) ?Sent RVP via resp culture on extubation  ?VAP bundle  ? ?Severe mitral stenosis ?CAD s/p CABG in 1980s. S/p PCI to LCx recently by Dr. Burt Knack.  ?Plan ?Dual antiplatelet therapy  ?Continue Zetia and statin  ? ?Acute systolic HF. EF 40-45% ? Septic CM. Has Mild LVH RV fxn mildly reduced.  ?Grade II diastolic dysfn ?Plan ?Continue Lasix again today, he may require second dose  ?Continue telemetry monitoring  ?Defer goal-directed medical therapy to cardiology ? ? ?Hyponatremia w/ diagnostic eval c/w SIADH remaining stable ~ 120 since admission despite salt tabs.  ?- bnp elevated. Na improving.  ?Plan ?Cont salt tabs ?Think we can change chems daily  ?IV lasix ?Avoid volume excess ? ? ?Hyperglycemia ?Plan ?Ssi  ? ?Best Practice (right click and "Reselect all SmartList Selections" daily)  ? ?Diet/type: Regular consistency (see orders) ?DVT prophylaxis: LMWH ?GI prophylaxis: PPI ?Lines: N/A ?Foley:  N/A ?Code Status:  full code ?Last date of multidisciplinary goals of care discussion [ Discussed code status with patient and family 5/2. Full code.] ? ? ?Critical care time: 32 min ?  ?Erick Colace ACNP-BC ?[place] ?Pager # [phone] OR # [phone] if no answer ? ? ? ? ? ? ?

## 2021-06-22 NOTE — Progress Notes (Signed)
BIPAP placed on pt for his 2 hours.  ?

## 2021-06-22 NOTE — Progress Notes (Signed)
[facility] ADULT ICU REPLACEMENT PROTOCOL ? ? ?The patient does apply for the [facility] Adult ICU Electrolyte Replacment Protocol based on the criteria listed below:  ? ?1.Exclusion criteria: TCTS patients, ECMO patients, and Dialysis patients ?2. Is GFR >/= 30 ml/min? Yes.   ?Patient's GFR today is >60 ?3. Is SCr </= 2? Yes.   ?Patient's SCr is 0.54 mg/dL ?4. Did SCr increase >/= 0.5 in 24 hours? No. ?5.Pt's weight >40kg  Yes.   ?6. Abnormal electrolyte(s): K+ 3.5  ?7. Electrolytes replaced per protocol ?8.  Call MD STAT for K+ </= 2.5, Phos </= 1, or Mag </= 1 ?Physician:  n/a ? ?Roberto Ponce 06/22/2021 4:56 AM ? ? ?

## 2021-06-22 NOTE — Plan of Care (Signed)
?  Problem: Clinical Measurements: ?Goal: Ability to maintain a body temperature in the normal range will improve ?Outcome: Progressing ?  ?Problem: Respiratory: ?Goal: Ability to maintain adequate ventilation will improve ?Outcome: Progressing ?Goal: Ability to maintain a clear airway will improve ?Outcome: Progressing ?  ?Problem: Clinical Measurements: ?Goal: Respiratory complications will improve ?Outcome: Progressing ?  ?Problem: Clinical Measurements: ?Goal: Ability to maintain a body temperature in the normal range will improve ?Outcome: Progressing ?  ?Problem: Respiratory: ?Goal: Ability to maintain adequate ventilation will improve ?Outcome: Progressing ?  ?

## 2021-06-22 NOTE — Progress Notes (Signed)
?Cardiology Progress Note  ?Patient ID: Roberto Ponce ?MRN: [id number] ?DOB: 1950/04/10 ?Date of Encounter: 06/22/2021 ? ?Primary Cardiologist: Mertie Moores, MD ? ?Subjective  ? ?Chief Complaint: SOB ? ?HPI: Still short of breath.  Extubated.  All of his culture data is negative.  Element of congestive heart failure thought to be a big issue here per critical care medicine. ? ?ROS:  ?All other ROS reviewed and negative. Pertinent positives noted in the HPI.    ? ?Inpatient Medications  ?Scheduled Meds: ? aspirin EC  81 mg Oral Daily  ? brimonidine  1 drop Left Eye BID  ? carvedilol  3.125 mg Oral BID WC  ? Chlorhexidine Gluconate Cloth  6 each Topical Daily  ? clopidogrel  75 mg Oral Q breakfast  ? docusate sodium  100 mg Oral BID  ? dorzolamide-timolol  1 drop Both Eyes BID  ? enoxaparin (LOVENOX) injection  40 mg Subcutaneous Q24H  ? ezetimibe  10 mg Oral QHS  ? furosemide  80 mg Intravenous Once  ? hydrocortisone sod succinate (SOLU-CORTEF) inj  50 mg Intravenous Q6H  ? insulin aspart  0-15 Units Subcutaneous TID WC  ? latanoprost  1 drop Both Eyes QHS  ? mouth rinse  15 mL Mouth Rinse BID  ? pantoprazole  40 mg Oral Daily  ? polyethylene glycol  17 g Oral Daily  ? rosuvastatin  40 mg Oral QHS  ? sodium chloride flush  10-40 mL Intracatheter Q12H  ? sodium chloride flush  3 mL Intravenous Q12H  ? sodium chloride  1 g Oral BID WC  ? ?Continuous Infusions: ? sodium chloride Stopped (06/20/21 0222)  ? sodium chloride Stopped (06/20/21 2019)  ? azithromycin Stopped (06/22/21 0059)  ? ?PRN Meds: ?sodium chloride, acetaminophen, albuterol, guaiFENesin, nitroGLYCERIN, ondansetron **OR** ondansetron (ZOFRAN) IV, sodium chloride flush  ? ?Vital Signs  ? ?Vitals:  ? 06/22/21 0500 06/22/21 0700 06/22/21 0800 06/22/21 0900  ?BP:  (!) 148/78 (!) 144/82 (!) 162/80  ?Pulse:  82 80 91  ?Resp:  (!) 40 (!) 28 (!) 31  ?Temp:   97.7 ?F (36.5 ?C)   ?TempSrc:   Oral   ?SpO2:  92% 94% 93%  ?Weight: 76.9 kg     ?Height:       ? ? ?Intake/Output Summary (Last 24 hours) at 06/22/2021 1142 ?Last data filed at 06/22/2021 [phone] ?Gross per 24 hour  ?Intake 519.91 ml  ?Output 3250 ml  ?Net -2730.09 ml  ? ? ?  06/22/2021  ?  5:00 AM 06/21/2021  ?  5:00 AM 06/19/2021  ?  5:00 AM  ?Last 3 Weights  ?Weight (lbs) 169 lb 8.5 oz 177 lb 14.6 oz 179 lb 6.4 oz  ?Weight (kg) 76.9 kg 80.7 kg 81.375 kg  ?   ? ?Telemetry  ?Overnight telemetry shows SR 90s, which I personally reviewed.  ? ? ?Physical Exam  ? ?Vitals:  ? 06/22/21 0500 06/22/21 0700 06/22/21 0800 06/22/21 0900  ?BP:  (!) 148/78 (!) 144/82 (!) 162/80  ?Pulse:  82 80 91  ?Resp:  (!) 40 (!) 28 (!) 31  ?Temp:   97.7 ?F (36.5 ?C)   ?TempSrc:   Oral   ?SpO2:  92% 94% 93%  ?Weight: 76.9 kg     ?Height:      ?  ?Intake/Output Summary (Last 24 hours) at 06/22/2021 1142 ?Last data filed at 06/22/2021 [phone] ?Gross per 24 hour  ?Intake 519.91 ml  ?Output 3250 ml  ?Net -2730.09 ml  ?  ? ?  06/22/2021  ?  5:00 AM 06/21/2021  ?  5:00 AM 06/19/2021  ?  5:00 AM  ?Last 3 Weights  ?Weight (lbs) 169 lb 8.5 oz 177 lb 14.6 oz 179 lb 6.4 oz  ?Weight (kg) 76.9 kg 80.7 kg 81.375 kg  ?  Body mass index is 25.04 kg/m?.  ?General: Well nourished, well developed, in no acute distress ?Head: Atraumatic, normal size  ?Eyes: PEERLA, EOMI  ?Neck: Supple, no JVD ?Endocrine: No thryomegaly ?Cardiac: Normal S1, S2; RRR; faint systolic murmur ?Lungs: Diffuse rales and rhonchi ?Abd: Soft, nontender, no hepatomegaly  ?Ext: No edema, pulses 2+ ?Musculoskeletal: No deformities, BUE and BLE strength normal and equal ?Skin: Warm and dry, no rashes   ?Neuro: Alert and oriented to person, place, time, and situation, CNII-XII grossly intact, no focal deficits  ?Psych: Normal mood and affect  ? ?Labs  ?High Sensitivity Troponin:   ?Recent Labs  ?Lab 06/20/21 ?1723 06/21/21 ?0500  ?TROPONINIHS 61* 16  ?   ?Cardiac EnzymesNo results for input(s): TROPONINI in the last 168 hours. No results for input(s): TROPIPOC in the last 168 hours.  ?Chemistry ?Recent Labs   ?Lab 06/20/21 ?2002 06/21/21 ?0358 06/21/21 ?1350 06/21/21 ?1750 06/22/21 ?3557  ?NA 125* 127* 129* 128* 132*  ?K 3.4* 4.1  --   --  3.5  ?CL 92* 97*  --   --  99  ?CO2 22 24  --   --  27  ?GLUCOSE 145* 193*  --   --  143*  ?BUN 7* 10  --   --  13  ?CREATININE 0.47* 0.53*  --   --  0.54*  ?CALCIUM 7.8* 7.9*  --   --  7.6*  ?PROT 6.0* 5.8*  --   --  5.3*  ?ALBUMIN 2.4* 2.3*  --   --  2.0*  ?AST 195* 152*  --   --  321*  ?ALT 186* 171*  --   --  297*  ?ALKPHOS 82 84  --   --  83  ?BILITOT 1.6* 0.8  --   --  0.7  ?GFRNONAA >60 >60  --   --  >60  ?ANIONGAP 11 6  --   --  6  ?  ?Hematology ?Recent Labs  ?Lab 06/20/21 ?2002 06/21/21 ?0358 06/22/21 ?3220  ?WBC 19.2* 16.4* 17.7*  ?RBC 3.49* 3.44* 3.13*  ?HGB 10.6* 10.2* 9.5*  ?HCT 29.5* 29.4* 27.8*  ?MCV 84.5 85.5 88.8  ?MCH 30.4 29.7 30.4  ?MCHC 35.9 34.7 34.2  ?RDW 14.1 13.9 14.4  ?PLT 282 271 259  ? ?BNP ?Recent Labs  ?Lab 06/20/21 ?1723 06/21/21 ?0358  ?BNP 667.6* 514.9*  ?  ?DDimer No results for input(s): DDIMER in the last 168 hours.  ? ?Radiology  ?CT CHEST WO CONTRAST ? ?Result Date: 06/20/2021 ?CLINICAL DATA:  Pneumonia, complication suspected. History coronary artery disease post MI and CABG. Recent diagnosis of testicular seminoma post orchiectomy. Presents with cough and shortness of breath, admitted for community-acquired pneumonia EXAM: CT CHEST WITHOUT CONTRAST TECHNIQUE: Multidetector CT imaging of the chest was performed following the standard protocol without IV contrast. RADIATION DOSE REDUCTION: This exam was performed according to the departmental dose-optimization program which includes automated exposure control, adjustment of the mA and/or kV according to patient size and/or use of iterative reconstruction technique. COMPARISON:  03/16/2021 FINDINGS: Cardiovascular: Atherosclerotic calcifications aorta, coronary arteries, proximal great vessels. Postsurgical changes of CABG. Enlargement of cardiac chambers. No pericardial effusion. Aorta normal  caliber. Mediastinum/Nodes: Esophagus unremarkable. Base of  cervical region normal appearance. Enlarged mediastinal lymph nodes new since previous exam including 14 mm RIGHT paratracheal node image 42, 13 mm subcarinal node image 62, 12 mm node adjacent to RIGHT mainstem bronchus image 55. Lungs/Pleura: Small BILATERAL pleural effusions. Calcified granuloma RIGHT lower lobe. Extensive airspace consolidation bilaterally greater on LEFT. Central peribronchial thickening. No mass or pneumothorax. Upper Abdomen: Visualized upper abdomen unremarkable Musculoskeletal: No osseous abnormalities. IMPRESSION: Extensive BILATERAL airspace consolidation greater on LEFT, consistent with pneumonia. Small BILATERAL pleural effusions. Nonspecific mediastinal adenopathy, likely reactive in the setting of extensive infiltrates but follow-up until resolution recommended in light of history of testicular cancer. Extensive atherosclerotic disease changes including coronary arteries with postsurgical changes of CABG. Aortic Atherosclerosis (ICD10-I70.0). Electronically Signed   By: Lavonia Dana M.D.   On: 06/20/2021 13:39  ? ?DG CHEST PORT 1 VIEW ? ?Result Date: 06/21/2021 ?CLINICAL DATA:  Central line placement EXAM: PORTABLE CHEST 1 VIEW COMPARISON:  Film from the previous day. FINDINGS: Endotracheal tube is noted just above the carina. Gastric catheter extends into the stomach. Right-sided PICC is seen at the cavoatrial junction. Cardiac shadow is stable. Postsurgical changes are again noted. Bilateral airspace opacities are noted left greater than right stable from the prior exam. IMPRESSION: New right-sided PICC in satisfactory position. The remainder of the study is stable. Electronically Signed   By: Inez Catalina M.D.   On: 06/21/2021 00:23  ? ?DG CHEST PORT 1 VIEW ? ?Result Date: 06/20/2021 ?CLINICAL DATA:  ARDS. EXAM: PORTABLE CHEST 1 VIEW COMPARISON:  Chest x-ray 06/18/2021. FINDINGS: Endotracheal tube tip is 2 cm above the carina.  Enteric tube extends below the diaphragm. Patient is status post cardiac surgery. Diffuse multifocal airspace disease, left greater than right, has not significantly changed compared to prior CT. Small bilateral pleu

## 2021-06-22 NOTE — Progress Notes (Signed)
? ? ?  Recent Labs  ?Lab 06/18/21 ?1457 06/20/21 ?2002 06/21/21 ?9629 06/22/21 ?5284  ?AST 96* 195* 152* 321*  ?ALT 104* 186* 171* 297*  ?ALKPHOS 61 82 84 83  ?BILITOT 0.5 1.6* 0.8 0.7  ?PROT 6.5 6.0* 5.8* 5.3*  ?ALBUMIN 3.0* 2.4* 2.3* 2.0*  ? ? ?Rising LFT ? ?Plan ? Stop crestor ?Stop tylenol prn ?Stip robitussin prn ?Stop azithromycin (off all abx) ?Check ltf 06/23/21 ? ? ? ? ?SIGNATURE  ? ? ?Dr. Brand Males, M.D., F.C.C.P,  ?Pulmonary and Critical Care Medicine ?Staff Physician, [place] ?Center Director - Interstitial Lung Disease  Program  ?Medical Director - [place] ICU ?[facility] ?[place], [place], [address] ? ?NPI Number:  NPI #[id number] ?DEA Number: [id number] ? ?Pager: [phone], If no answer  -> Check AMION or Try [phone] ?Telephone (clinical office): [phone] ?Telephone (research): [phone] ? ?3:12 PM ?06/22/2021 ? ?

## 2021-06-22 NOTE — Telephone Encounter (Signed)
I did not need this encounter. °

## 2021-06-23 ENCOUNTER — Inpatient Hospital Stay (HOSPITAL_COMMUNITY): Payer: PPO

## 2021-06-23 DIAGNOSIS — J9601 Acute respiratory failure with hypoxia: Secondary | ICD-10-CM | POA: Diagnosis not present

## 2021-06-23 DIAGNOSIS — D72829 Elevated white blood cell count, unspecified: Secondary | ICD-10-CM

## 2021-06-23 DIAGNOSIS — D649 Anemia, unspecified: Secondary | ICD-10-CM

## 2021-06-23 DIAGNOSIS — R7989 Other specified abnormal findings of blood chemistry: Secondary | ICD-10-CM

## 2021-06-23 DIAGNOSIS — I340 Nonrheumatic mitral (valve) insufficiency: Secondary | ICD-10-CM | POA: Insufficient documentation

## 2021-06-23 LAB — GLUCOSE, CAPILLARY
Glucose-Capillary: 118 mg/dL — ABNORMAL HIGH (ref 70–99)
Glucose-Capillary: 113 mg/dL — ABNORMAL HIGH (ref 70–99)
Glucose-Capillary: 132 mg/dL — ABNORMAL HIGH (ref 70–99)
Glucose-Capillary: 129 mg/dL — ABNORMAL HIGH (ref 70–99)
Glucose-Capillary: 179 mg/dL — ABNORMAL HIGH (ref 70–99)
Glucose-Capillary: 130 mg/dL — ABNORMAL HIGH (ref 70–99)

## 2021-06-23 LAB — COMPREHENSIVE METABOLIC PANEL
ALT: 414 U/L — ABNORMAL HIGH (ref 0–44)
AST: 321 U/L — ABNORMAL HIGH (ref 15–41)
Albumin: 2.3 g/dL — ABNORMAL LOW (ref 3.5–5.0)
Alkaline Phosphatase: 107 U/L (ref 38–126)
Anion gap: 10 (ref 5–15)
BUN: 21 mg/dL (ref 8–23)
CO2: 29 mmol/L (ref 22–32)
Calcium: 8.1 mg/dL — ABNORMAL LOW (ref 8.9–10.3)
Chloride: 95 mmol/L — ABNORMAL LOW (ref 98–111)
Creatinine, Ser: 0.55 mg/dL — ABNORMAL LOW (ref 0.61–1.24)
GFR, Estimated: >60 mL/min (ref >60)
Glucose, Bld: 154 mg/dL — ABNORMAL HIGH (ref 70–99)
Potassium: 3.6 mmol/L (ref 3.5–5.1)
Sodium: 134 mmol/L — ABNORMAL LOW (ref 135–145)
Total Bilirubin: 1.4 mg/dL — ABNORMAL HIGH (ref 0.3–1.2)
Total Protein: 5.9 g/dL — ABNORMAL LOW (ref 6.5–8.1)

## 2021-06-23 LAB — CULTURE, BLOOD (ROUTINE X 2)
Culture: NO GROWTH
Culture: NO GROWTH
Special Requests: ADEQUATE
Special Requests: ADEQUATE

## 2021-06-23 LAB — CBC
HCT: 31.6 % — ABNORMAL LOW (ref 39.0–52.0)
Hemoglobin: 10.6 g/dL — ABNORMAL LOW (ref 13.0–17.0)
MCH: 29.9 pg (ref 26.0–34.0)
MCHC: 33.5 g/dL (ref 30.0–36.0)
MCV: 89 fL (ref 80.0–100.0)
Platelets: 303 10*3/uL (ref 150–400)
RBC: 3.55 MIL/uL — ABNORMAL LOW (ref 4.22–5.81)
RDW: 14.4 % (ref 11.5–15.5)
WBC: 16.3 10*3/uL — ABNORMAL HIGH (ref 4.0–10.5)
nRBC: 0 % (ref 0.0–0.2)

## 2021-06-23 LAB — BRAIN NATRIURETIC PEPTIDE: B Natriuretic Peptide: 229.2 pg/mL — ABNORMAL HIGH (ref 0.0–100.0)

## 2021-06-23 LAB — PROTIME-INR
INR: 1.2 (ref 0.8–1.2)
Prothrombin Time: 15.1 seconds (ref 11.4–15.2)

## 2021-06-23 MED ORDER — POTASSIUM CHLORIDE CRYS ER 20 MEQ PO TBCR
40.0000 meq | EXTENDED_RELEASE_TABLET | Freq: Once | ORAL | Status: AC
  Administered 2021-06-23: 40 meq via ORAL
  Filled 2021-06-23: qty 2

## 2021-06-23 MED ORDER — DAPAGLIFLOZIN PROPANEDIOL 10 MG PO TABS
10.0000 mg | ORAL_TABLET | Freq: Every day | ORAL | Status: DC
  Administered 2021-06-23 – 2021-06-28 (×6): 10 mg via ORAL
  Filled 2021-06-23 (×6): qty 1

## 2021-06-23 MED ORDER — SPIRONOLACTONE 12.5 MG HALF TABLET
12.5000 mg | ORAL_TABLET | Freq: Every day | ORAL | Status: DC
  Administered 2021-06-23 – 2021-06-28 (×6): 12.5 mg via ORAL
  Filled 2021-06-23 (×6): qty 1

## 2021-06-23 MED ORDER — FUROSEMIDE 10 MG/ML IJ SOLN
40.0000 mg | Freq: Two times a day (BID) | INTRAMUSCULAR | Status: AC
  Administered 2021-06-23 (×2): 40 mg via INTRAVENOUS
  Filled 2021-06-23 (×2): qty 4

## 2021-06-23 NOTE — Progress Notes (Signed)
?PROGRESS NOTE ?Roberto Ponce  XBM:[id number] DOB: July 21, 1950 DOA: 06/18/2021 ?PCP: Kristen Loader, FNP  ? ?Brief Narrative/Hospital Course: ?47 yom w/ CAD s/p STEMI in the 1980s ,recent course complicated by mitral valve prolapse with worsening regurgitation noted on TTE, and TEE,classified as severe. Workup has included a left/right heart cath, which identified a 95% LCx occlusion treated with PCI. CVTS workup is pending once he is no longer requiring anti-platelet agents. Recent course also complicated by diagnosis of testicular cancer in march of this year treated with right radical inguinal orchiectomy. No other therapy indicated at this time, presented to ED 5/1 with shortness of breath x1 week with mild cough , orthopnea. See at urgent care 4/27 and was diagnosed as viral pneumonia and was discharged, but presented to ED 5/1 due to worsening symptoms.  Initially admitted as CAP -Improved but got significantly worse 5/2-placed on BiPAP, PCCM consulted-moved to ICU-diagnosed as systolc CHF, intubated and finally extubated.  Seen by cardiology.Patient transferred to [facility] service 5/5.  ? ?Subjective: ?Seen and examined. ?Wife at the bedside. ?Denies any shortness of breath, resting comfortably, on 3 L nasal cannula ?  ?Assessment and Plan: ?Principal Problem: ?  Acute respiratory failure with hypoxia (HCC) ?Active Problems: ?  CAD (coronary artery disease) of artery bypass graft ?  Hyponatremia ?  Acute systolic CHF (congestive heart failure) ([place]) ?  MR (mitral regurgitation) ?  Abnormal LFTs ?  Normocytic anemia ?  Leucocytosis ? ?Acute respiratory failure with hypoxia ?Acute systolic congestive heart failure ?Moderate to severe mitral valve regurgitation: ?Admitted with worsening shortness of breath with concern for pneumonia briefly intubated, work-up so far negative and respiratory culture, viral panel negative bronchoscopy with BAL negative for infectious work-up autoimmune work-up including ANCA, ANA,  antiscleroderma, sjogren's negative HIV negative strep pneumonia negative Legionella negative.  At this time pneumonia ruled out suspecting acute systolic CHF due to his valvular disease -Echo with EF 40-45%, moderate MR (mitral valve y unchanged).on Solu-Cortef day #4/5 then start taper.Continue with diuresis, GDMT as per cardiology- on Coreg losartan.Monitor intake output Daily weight.  Weight is improving nicely, has total net negative,Net IO Since Admission: -2,584.37 mL [06/23/21 1027]  ?Filed Weights  ? 06/21/21 0500 06/22/21 0500 06/23/21 0441  ?Weight: 80.7 kg 76.9 kg 74.7 kg  ?  ?CAD s/p CABG, patent grafts recent PCI to the mid left circumflex, on DAPT ?Minimally elevated troponin trending down likely demand ischemia due to hypoxia-Per cardiology ? ?Hyponatremia suspecting SIADH- 120 on admits-remains stable  134 now. ? ?Leukocytosis:?  Etiology but patient has been getting hydrocortisone likely the cause, off antibiotics. trend. ? ?Abnormal LFTs: Unclear etiology likely multifactorial -mostly hepatic congestion from CHF.  Trending up, off statin azithromycin Tylenol 5/4.PCCM ordered ultrasound, follow up.   ? ?Normocytic anemia: HB stable 10 g range.  Monitor ? ?DVT prophylaxis: enoxaparin (LOVENOX) injection 40 mg Start: 06/18/21 1800 ?Code Status:   Code Status: Full Code ?Family Communication: plan of care discussed with patient and his wife at bedside. ?Patient status is: Inpatient because of ongoing management of hypoxia and heart failure, abnormal LFTs ?Level of care: ICU  ? ?Dispo: The patient is from: home ?           Anticipated disposition: home ? ?Mobility Assessment (last 72 hours)   ? ? Mobility Assessment   ?No documentation. ? ?  ?  ? ?  ?  ? ?Objective: ?Vitals last 24 hrs: ?Vitals:  ? 06/22/21 2307 06/23/21 0318 06/23/21 0400 06/23/21 0441  ?  BP:  (!) 116/54    ?Pulse: 69 61    ?Resp: (!) 24 (!) 24    ?Temp:   (!) 97.5 ?F (36.4 ?C)   ?TempSrc:   Oral   ?SpO2: 94% 96%    ?Weight:    74.7 kg   ?Height:      ? ?Weight change: -2.2 kg ? ?Physical Examination: ?General exam: alert awake o x 3,older than stated age, weak appearing. ?HEENT:Oral mucosa moist, Ear/Nose WNL grossly, dentition normal. ?Respiratory system: bilaterally coarse basal crackles present,no use of accessory muscle. ?Cardiovascular system: S1 & S2 +, No JVD. ?Gastrointestinal system: Abdomen soft,NT,ND, BS+. ?Nervous System:Alert, awake, moving extremities and grossly nonfocal. ?Extremities: LE edema trace,distal peripheral pulses palpable.  ?Skin: No rashes,no icterus. ?MSK: Normal muscle bulk,tone, power. ? ?Medications reviewed:  ?Scheduled Meds: ? aspirin EC  81 mg Oral Daily  ? brimonidine  1 drop Left Eye BID  ? carvedilol  3.125 mg Oral BID WC  ? Chlorhexidine Gluconate Cloth  6 each Topical Daily  ? clopidogrel  75 mg Oral Q breakfast  ? dapagliflozin propanediol  10 mg Oral Daily  ? docusate sodium  100 mg Oral BID  ? dorzolamide-timolol  1 drop Both Eyes BID  ? enoxaparin (LOVENOX) injection  40 mg Subcutaneous Q24H  ? ezetimibe  10 mg Oral QHS  ? furosemide  40 mg Intravenous BID  ? hydrocortisone sod succinate (SOLU-CORTEF) inj  50 mg Intravenous Q6H  ? insulin aspart  0-15 Units Subcutaneous TID WC  ? latanoprost  1 drop Both Eyes QHS  ? losartan  12.5 mg Oral Daily  ? mouth rinse  15 mL Mouth Rinse BID  ? melatonin  3 mg Oral QHS  ? pantoprazole  40 mg Oral Daily  ? polyethylene glycol  17 g Oral Daily  ? sodium chloride flush  10-40 mL Intracatheter Q12H  ? sodium chloride flush  3 mL Intravenous Q12H  ? sodium chloride  1 g Oral BID WC  ? spironolactone  12.5 mg Oral Daily  ? ?Continuous Infusions: ? sodium chloride Stopped (06/20/21 0222)  ? sodium chloride Stopped (06/20/21 2019)  ? ? ?  ?Diet Order   ? ?       ?  Diet Heart Room service appropriate? Yes; Fluid consistency: Thin  Diet effective now       ?  ? ?  ?  ? ?  ?  ? ?  ?  ?  ? ? ?Intake/Output Summary (Last 24 hours) at 06/23/2021 1025 ?Last data filed at 06/23/2021  0400 ?Gross per 24 hour  ?Intake 30 ml  ?Output 2575 ml  ?Net -2545 ml  ? ?Net IO Since Admission: -2,584.37 mL [06/23/21 1025]  ?Wt Readings from Last 3 Encounters:  ?06/23/21 74.7 kg  ?05/18/21 74.5 kg  ?04/19/21 74.8 kg  ?  ? ?Unresulted Labs (From admission, onward)  ? ?  Start     Ordered  ? 06/25/21 0500  Creatinine, serum  (enoxaparin (LOVENOX)    CrCl >/= 30 ml/min)  Weekly,   R     ?Comments: while on enoxaparin therapy ?  ? 06/18/21 1708  ? 06/20/21 1905  Acid Fast Smear (AFB)  (AFB smear + Culture w reflexed sensitivities panel)  Once,   R       ?Question:  Patient immune status  Answer:  Normal  ?See Hyperspace for full Linked Orders Report.  ? 06/20/21 1904  ? 06/20/21 1905  Acid Fast Culture with  reflexed sensitivities  (AFB smear + Culture w reflexed sensitivities panel)  Once,   R       ?Question:  Patient immune status  Answer:  Normal  ?See Hyperspace for full Linked Orders Report.  ? 06/20/21 1904  ? 06/20/21 1905  Fungus Culture With Stain  ONCE - URGENT,   URGENT       ?Comments: RML ?  ?Question:  Patient immune status  Answer:  Normal  ? 06/20/21 1904  ? 06/20/21 1743  Urine drugs of abuse scrn w alc, routine (Ref Lab)  Once,   R       ? 06/20/21 1743  ? ?  ?  ? ?  ?Data Reviewed: I have personally reviewed following labs and imaging studies ?CBC: ?Recent Labs  ?Lab 06/29/2021 ?1457 2021-06-29 ?1735 06/20/21 ?0409 06/20/21 ?2002 06/21/21 ?8675 06/22/21 ?4492 06/23/21 ?0100  ?WBC 11.1*   < > 14.3* 19.2* 16.4* 17.7* 16.3*  ?NEUTROABS 8.8*  --  12.3*  --   --  15.6*  --   ?HGB 11.1*   < > 10.5* 10.6* 10.2* 9.5* 10.6*  ?HCT 31.4*   < > 30.1* 29.5* 29.4* 27.8* 31.6*  ?MCV 85.1   < > 86.0 84.5 85.5 88.8 89.0  ?PLT 166   < > 191 282 271 259 303  ? < > = values in this interval not displayed.  ? ?Basic Metabolic Panel: ?Recent Labs  ?Lab 06-29-21 ?1735 06/19/21 ?0429 06/20/21 ?0409 06/20/21 ?1539 06/20/21 ?2002 06/20/21 ?2003 06/21/21 ?0358 06/21/21 ?1350 06/21/21 ?1750 06/22/21 ?7121 06/23/21 ?9758  ?NA   --    < > 120*   < > 125*  --  127* 129* 128* 132* 134*  ?K  --    < > 3.7  --  3.4*  --  4.1  --   --  3.5 3.6  ?CL  --    < > 87*  --  92*  --  97*  --   --  99 95*  ?CO2  --    < > 24  --  22  -

## 2021-06-23 NOTE — Progress Notes (Addendum)
?Cardiology Progress Note  ?Patient ID: Roberto Ponce ?MRN: [id number] ?DOB: 1950/09/30 ?Date of Encounter: 06/23/2021 ? ?Primary Cardiologist: Mertie Moores, MD ? ?Subjective  ? ?Chief Complaint: SOB ? ?HPI: On 5 L nasal cannula.  Still short of breath.  Still tachypneic.  Plan for further diuresis. ? ?ROS:  ?All other ROS reviewed and negative. Pertinent positives noted in the HPI.    ? ?Inpatient Medications  ?Scheduled Meds: ? aspirin EC  81 mg Oral Daily  ? brimonidine  1 drop Left Eye BID  ? carvedilol  3.125 mg Oral BID WC  ? Chlorhexidine Gluconate Cloth  6 each Topical Daily  ? clopidogrel  75 mg Oral Q breakfast  ? dapagliflozin propanediol  10 mg Oral Daily  ? docusate sodium  100 mg Oral BID  ? dorzolamide-timolol  1 drop Both Eyes BID  ? enoxaparin (LOVENOX) injection  40 mg Subcutaneous Q24H  ? ezetimibe  10 mg Oral QHS  ? furosemide  40 mg Intravenous BID  ? hydrocortisone sod succinate (SOLU-CORTEF) inj  50 mg Intravenous Q6H  ? insulin aspart  0-15 Units Subcutaneous TID WC  ? latanoprost  1 drop Both Eyes QHS  ? losartan  12.5 mg Oral Daily  ? mouth rinse  15 mL Mouth Rinse BID  ? melatonin  3 mg Oral QHS  ? pantoprazole  40 mg Oral Daily  ? polyethylene glycol  17 g Oral Daily  ? sodium chloride flush  10-40 mL Intracatheter Q12H  ? sodium chloride flush  3 mL Intravenous Q12H  ? sodium chloride  1 g Oral BID WC  ? spironolactone  12.5 mg Oral Daily  ? ?Continuous Infusions: ? sodium chloride Stopped (06/20/21 0222)  ? sodium chloride Stopped (06/20/21 2019)  ? ?PRN Meds: ?sodium chloride, albuterol, nitroGLYCERIN, ondansetron **OR** ondansetron (ZOFRAN) IV, sodium chloride flush  ? ?Vital Signs  ? ?Vitals:  ? 06/22/21 2307 06/23/21 0318 06/23/21 0400 06/23/21 0441  ?BP:  (!) 116/54    ?Pulse: 69 61    ?Resp: (!) 24 (!) 24    ?Temp:   (!) 97.5 ?F (36.4 ?C)   ?TempSrc:   Oral   ?SpO2: 94% 96%    ?Weight:    74.7 kg  ?Height:      ? ? ?Intake/Output Summary (Last 24 hours) at 06/23/2021 0809 ?Last  data filed at 06/23/2021 0400 ?Gross per 24 hour  ?Intake 30 ml  ?Output 2575 ml  ?Net -2545 ml  ? ? ?  06/23/2021  ?  4:41 AM 06/22/2021  ?  5:00 AM 06/21/2021  ?  5:00 AM  ?Last 3 Weights  ?Weight (lbs) 164 lb 10.9 oz 169 lb 8.5 oz 177 lb 14.6 oz  ?Weight (kg) 74.7 kg 76.9 kg 80.7 kg  ?   ? ?Telemetry  ?Overnight telemetry shows sinus rhythm in the 70s, which I personally reviewed.  ? ?Physical Exam  ? ?Vitals:  ? 06/22/21 2307 06/23/21 0318 06/23/21 0400 06/23/21 0441  ?BP:  (!) 116/54    ?Pulse: 69 61    ?Resp: (!) 24 (!) 24    ?Temp:   (!) 97.5 ?F (36.4 ?C)   ?TempSrc:   Oral   ?SpO2: 94% 96%    ?Weight:    74.7 kg  ?Height:      ?  ?Intake/Output Summary (Last 24 hours) at 06/23/2021 0809 ?Last data filed at 06/23/2021 0400 ?Gross per 24 hour  ?Intake 30 ml  ?Output 2575 ml  ?Net -2545 ml  ?  ? ?  06/23/2021  ?  4:41 AM 06/22/2021  ?  5:00 AM 06/21/2021  ?  5:00 AM  ?Last 3 Weights  ?Weight (lbs) 164 lb 10.9 oz 169 lb 8.5 oz 177 lb 14.6 oz  ?Weight (kg) 74.7 kg 76.9 kg 80.7 kg  ?  Body mass index is 24.32 kg/m?.  ?General: Well nourished, well developed, in no acute distress ?Head: Atraumatic, normal size  ?Eyes: PEERLA, EOMI  ?Neck: Supple, JVD 5 to 7 cm of water ?Endocrine: No thryomegaly ?Cardiac: Normal S1, S2; RRR; 2 out of 6 holosystolic murmur, not prominent ?Lungs: Rales bilaterally ?Abd: Soft, nontender, no hepatomegaly  ?Ext: No edema, pulses 2+ ?Musculoskeletal: No deformities, BUE and BLE strength normal and equal ?Skin: Warm and dry, no rashes   ?Neuro: Alert and oriented to person, place, time, and situation, CNII-XII grossly intact, no focal deficits  ?Psych: Normal mood and affect  ? ?Labs  ?High Sensitivity Troponin:   ?Recent Labs  ?Lab 06/20/21 ?1723 06/21/21 ?0500  ?TROPONINIHS 61* 16  ?   ?Cardiac EnzymesNo results for input(s): TROPONINI in the last 168 hours. No results for input(s): TROPIPOC in the last 168 hours.  ?Chemistry ?Recent Labs  ?Lab 06/21/21 ?0358 06/21/21 ?1350 06/21/21 ?1750 06/22/21 ?0412  06/23/21 ?2778  ?NA 127*   < > 128* 132* 134*  ?K 4.1  --   --  3.5 3.6  ?CL 97*  --   --  99 95*  ?CO2 24  --   --  27 29  ?GLUCOSE 193*  --   --  143* 154*  ?BUN 10  --   --  13 21  ?CREATININE 0.53*  --   --  0.54* 0.55*  ?CALCIUM 7.9*  --   --  7.6* 8.1*  ?PROT 5.8*  --   --  5.3* 5.9*  ?ALBUMIN 2.3*  --   --  2.0* 2.3*  ?AST 152*  --   --  321* 321*  ?ALT 171*  --   --  297* 414*  ?ALKPHOS 84  --   --  83 107  ?BILITOT 0.8  --   --  0.7 1.4*  ?GFRNONAA >60  --   --  >60 >60  ?ANIONGAP 6  --   --  6 10  ? < > = values in this interval not displayed.  ?  ?Hematology ?Recent Labs  ?Lab 06/21/21 ?0358 06/22/21 ?0412 06/23/21 ?2423  ?WBC 16.4* 17.7* 16.3*  ?RBC 3.44* 3.13* 3.55*  ?HGB 10.2* 9.5* 10.6*  ?HCT 29.4* 27.8* 31.6*  ?MCV 85.5 88.8 89.0  ?MCH 29.7 30.4 29.9  ?MCHC 34.7 34.2 33.5  ?RDW 13.9 14.4 14.4  ?PLT 271 259 303  ? ?BNP ?Recent Labs  ?Lab 06/20/21 ?1723 06/21/21 ?0358 06/23/21 ?5361  ?BNP 667.6* 514.9* 229.2*  ?  ?DDimer No results for input(s): DDIMER in the last 168 hours.  ? ?Radiology  ?VAS [place] LOWER EXTREMITY VENOUS (DVT) ? ?Result Date: 06/21/2021 ? Lower Venous DVT Study Patient Name:  ROMARIO TITH  Date of Exam:   06/21/2021 Medical Rec #: [id number]            Accession #:    [id number] Date of Birth: 11-25-1950            Patient Gender: M Patient Age:   71 years Exam Location:  [facility] Procedure:      VAS [place] LOWER EXTREMITY VENOUS (DVT) Referring Phys: MURALI RAMASWAMY --------------------------------------------------------------------------------  Indications: Edema.  Comparison Study: No  previous exams Performing Technologist: Jody Hill RVT, RDMS  Examination Guidelines: A complete evaluation includes B-mode imaging, spectral Doppler, color Doppler, and power Doppler as needed of all accessible portions of each vessel. Bilateral testing is considered an integral part of a complete examination. Limited examinations for reoccurring indications may be performed as noted. The  reflux portion of the exam is performed with the patient in reverse Trendelenburg.  +---------+---------------+---------+-----------+----------+--------------+ RIGHT    CompressibilityPhasicitySpontaneityPropertiesThrombus Aging +---------+---------------+---------+-----------+----------+--------------+ CFV      Full           Yes      Yes                                 +---------+---------------+---------+-----------+----------+--------------+ SFJ      Full                                                        +---------+---------------+---------+-----------+----------+--------------+ FV Prox  Full           Yes      Yes                                 +---------+---------------+---------+-----------+----------+--------------+ FV Mid   Full           Yes      Yes                                 +---------+---------------+---------+-----------+----------+--------------+ FV DistalFull           Yes      Yes                                 +---------+---------------+---------+-----------+----------+--------------+ PFV      Full                                                        +---------+---------------+---------+-----------+----------+--------------+ POP      Full           Yes      Yes                                 +---------+---------------+---------+-----------+----------+--------------+ PTV      Full                                                        +---------+---------------+---------+-----------+----------+--------------+ PERO     Full                                                        +---------+---------------+---------+-----------+----------+--------------+   +---------+---------------+---------+-----------+----------+--------------+  LEFT     CompressibilityPhasicitySpontaneityPropertiesThrombus Aging +---------+---------------+---------+-----------+----------+--------------+ CFV      Full           Yes       Yes                                 +---------+---------------+---------+-----------+----------+--------------+ SFJ      Full                                                        +---------+---------------+---------+-----------+----------+--------------+ FV Prox  Full           Yes

## 2021-06-23 NOTE — Progress Notes (Signed)
[facility] ADULT ICU REPLACEMENT PROTOCOL ? ? ?The patient does apply for the [facility] Adult ICU Electrolyte Replacment Protocol based on the criteria listed below:  ? ?1.Exclusion criteria: TCTS patients, ECMO patients, and Dialysis patients ?2. Is GFR >/= 30 ml/min? Yes.    ?Patient's GFR today is >60 ?3. Is SCr </= 2? Yes.   ?Patient's SCr is 0.55 mg/dL ?4. Did SCr increase >/= 0.5 in 24 hours? No. ?5.Pt's weight >40kg  Yes.   ?6. Abnormal electrolyte(s): K+ 3.6  ?7. Electrolytes replaced per protocol ?8.  Call MD STAT for K+ </= 2.5, Phos </= 1, or Mag </= 1 ?Physician:  n/a ? ?Darlys Gales 06/23/2021 5:40 AM ? ?

## 2021-06-23 NOTE — Hospital Course (Addendum)
54 yom w/ CAD s/p STEMI in the 1980s ,recent course complicated by mitral valve prolapse with worsening regurgitation noted on TTE, and TEE,classified as severe. Workup has included a left/right heart cath, which identified a 95% LCx occlusion treated with PCI. CVTS workup is pending once he is no longer requiring anti-platelet agents. Recent course also complicated by diagnosis of testicular cancer in march of this year treated with right radical inguinal orchiectomy. No other therapy indicated at this time, presented to ED 5/1 with shortness of breath x1 week with mild cough , orthopnea. See at urgent care 4/27 and was diagnosed as viral pneumonia and was discharged, but presented to ED 5/1 due to worsening symptoms.  Initially admitted as CAP -Improved but got significantly worse 5/2-placed on BiPAP, PCCM consulted-moved to ICU-diagnosed as systolc CHF, intubated and finally extubated.  Seen by cardiology.Patient transferred to [facility] service 5/5.  ? ? ?

## 2021-06-23 NOTE — Progress Notes (Addendum)
? ?NAME:  Roberto Ponce, MRN:  [id number], DOB:  Jul 19, 1950, LOS: 4 ?ADMISSION DATE:  06/18/2021, CONSULTATION DATE:  5/2 ?REFERRING MD:  Dr Doristine Bosworth, CHIEF COMPLAINT:  Hypoxia  ? ?History of Present Illness:  ?71 year old male with PMH as below, which is significant for CAD s/p STEMI in the 1980s. Recent course complicated by mitral valve prolapse with worsening regurgitation noted on TTE, and TEE. Classified as severe. Workup has included a left/right heart cath, which identified a 95% LCx occlusion treated with PCI. CVTS workup is pending once he is no longer requiring anti-platelet agents. Recent course also complicated by diagnosis of testicular cancer in march of this year treated with right radical inguinal orchiectomy. No other therapy indicated at this time.  ? ?He presented to [facility] ED 5/1 with complaints of shortness of breath. Symptoms started about one week prior with mild cough and SOB. Denied production with cough or sick contacts. Highest temp at home was 99.4. Also described orthopnea (normally sleeps with 1-2 pillows and needed to sleep upright on couch). He presented to urgent care 4/27 and was diagnosed as viral pneumonia and was discharged. Symptoms worsened causing him to present on 5/1. He was admitted to the hospitalists on treatment for CAP including supplemental oxygen. On 5/2 imaging looked worse there were increasing oxygen requirements. He was started on BiPAP for work of breathing. PCCM was consulted.  ? ?Pertinent  Medical History  ? has a past medical history of Asthma, Coronary artery disease, Glaucoma, History of gallstones, Hyperlipidemia, Hypotension due to medication (06/22/2021), LVH (left ventricular hypertrophy), MI (myocardial infarction) ([place]), MI, old, Mild aortic sclerosis, Mitral regurgitation, Mixed dyslipidemia, Shock ([place]) (06/22/2021), and Venous stasis. ? ? ?Significant Hospital Events: ?Including procedures, antibiotic start and stop dates in addition to other  pertinent events   ?5/1 admitted ?5/2 PCCM consulted for worsening hypoxia. Intubated. Bronch'd. ILD markers sent. Systemic steroids added. Abx widened to cefepime, azith and vanc RUE PICC placed  ?5/3 sed rate 67, ANA and Anti-DNA ab negative, Sjogren syndrome A and B negative,  ?Antiscleroderma neg. RVP negative. Urine strep and legionella antigens negative weaning on vent getting lasix.  Successfully extubated ?5/4 weaning oxygen but still reporting shortness of breath getting additional Lasix, follow-up respiratory viral panel again negative.  Pneumocystis smear was negative BAL still pending GBM AB- ? ?Interim History / Subjective:  ?Reports short of breath with exertion ?Objective   ?Blood pressure (Abnormal) 116/54, pulse 61, temperature (Abnormal) 97.5 ?F (36.4 ?C), temperature source Oral, resp. rate (Abnormal) 24, height _0  (1.753 m), weight 74.7 kg, SpO2 96 %. ?   ?   ? ?Intake/Output Summary (Last 24 hours) at 06/23/2021 0842 ?Last data filed at 06/23/2021 0400 ?Gross per 24 hour  ?Intake 30 ml  ?Output 2575 ml  ?Net -2545 ml  ? ?Filed Weights  ? 06/21/21 0500 06/22/21 0500 06/23/21 0441  ?Weight: 80.7 kg 76.9 kg 74.7 kg  ? ? ?Examination: ? ?General: 71 year old white male resting in bed currently no acute distress ?HEENT: Normocephalic atraumatic no jugular venous distention ?Pulmonary: Bibasilar rales, improved compared with exam on yesterday.  Currently 3 and half liters nasal cannula no accessory use ?Portable chest x-ray personally reviewed shows left greater than right airspace disease but overall improvement in aeration there is no focal point of narrowing noted radiographically on the trachea->clinically there is no audible wheeze in the upper airway  ?Cardiac regular rate and rhythm ?Abdomen soft not tender ?Extremities warm dry with brisk  capillary refill ?GU voids spontaneously ?Neuro intact ? ?Resolved Hospital Problem list   ?Shock ?Drug related hypotension  ? ?Assessment & Plan:  ?Principal  Problem: ?  Acute respiratory failure with hypoxia (HCC) ?Active Problems: ?  CAD (coronary artery disease) of artery bypass graft ?  Hyponatremia ?  Acute systolic CHF (congestive heart failure) ([place]) ?  MR (mitral regurgitation) ?  Abnormal LFTs ?  Normocytic anemia ?  Leucocytosis ? ? ?Acute hypoxemic respiratory failure: 2/2 PNA (NOS) complicated by decompensated heart failure and pulmonary edema  ?Successfully extubated 5/3 ?Completed antibiotics on 5/4, more inclined to think pulmonary edema was the driving factor for intubation ?plan ?Day #4 of 5 Solu-Cortef, then will initiate wean/taper over about 10 days ?Continue to wean supplemental oxygen ?Continue Lasix and spironolactone, will need to watch renal function closely still trying to determine optimal diuretic regimen ?Discontinue BiPAP ?Mobilization ?I think he can safely be moved to the progressive care unit ?\ ? ?Severe mitral stenosis ?CAD s/p CABG in 1980s. S/p PCI to LCx recently by Dr. Burt Knack.  ?Plan ?Continue dual antiplatelet therapy  ?Continue Zetia ? ? ?Acute systolic HF. EF 40-45% ? Septic CM. Has Mild LVH RV fxn mildly reduced.  ?Grade II diastolic dysfn ?Plan ?Appreciate cardiology, currently on Coreg, spironolactone, and Lasix as well as losartan  ?Additional titration to be directed by cardiology  ?Tentative plan for right heart cath on Monday  ? ?Hyponatremia w/ diagnostic eval c/w SIADH remaining stable ~ 120 since admission despite salt tabs.  ?- bnp improving Na improving.  ?Plan ?Continue salt tabs ?Daily assessment of chemistries during diuresis ?Continue to aim for negative volume status ? ?Hyperglycemia ?Plan ?Continue sliding scale insulin, we may be able to stop this once he is off steroids ? ?Abnormal LFTs ?Plan ?Ultrasound ordered ? by hospitalist team ?Currently holding statins ?Best Practice (right click and "Reselect all SmartList Selections" daily)  ? ?Diet/type: Regular consistency (see orders) ?DVT prophylaxis: LMWH ?GI  prophylaxis: PPI ?Lines: N/A ?Foley:  N/A ?Code Status:  full code ?Last date of multidisciplinary goals of care discussion [ Discussed code status with patient and family 5/2. Full code.] ? ? ?Critical care time: NA ?Critical care will s/o  ?  ?Erick Colace ACNP-BC ?[place] ?Pager # [phone] OR # [phone] if no answer ? ? ? ? ? ? ?

## 2021-06-23 NOTE — Progress Notes (Incomplete)
Admit date:06/18/2021 ?Chief compliant: Shortness of breath ?71 year old male with history of CAD s/p CABG and s/p recent PTCA, mitral valve prolapse, recently diagnosed testicular cancer, s/p right radical inguinal orchiectomy initially presented to [facility] ED on 4/27 with complaints of cough, SOB, orthopnea,diagnosed with viral pneumonia and discharged.  Patient presented back to the ED with complaints of worsening dyspnea ?Hospital course: Patient admitted to [facility] for above complaints and treated initially as community-acquired pneumonia and improved, subsequently got worse requiring BiPAP.  Seen by PCCM and needed ICU transfer/mechanical ventilation.  Aggressively diuresed for acute exacerbation of systolic CHF in the setting of moderate-severe mitral valve regurgitation.  Viral panel and bacterial infection work-up negative.  Autoimmune work-up Negative.  Echo showed EF 40 to 45%.  Eventually extubated and transferred out of ICU on 5/5 ? ?Assessment/Plan: ? ?1.  Acute hypoxic respiratory failure: Required mechanical ventilation in ICU.  Now on nasal cannula and still requiring 3 L [place]-Taper as tolerated.  Tapering steroids and continuing diuretics. ? ?2.  Acute systolic CHF exacerbation EF 40-45%: Continue IV diuretics, I's and O's, daily weights.  Follow-up cardiology for MVP/moderate to severe MR and CVTS work-up as OP (planned when no longer requiring antiplatelet agents) ? ?3.  CAD, s/p CABG, troponinemia: Patient had recent PCI to the mid left circumflex artery and is on DAPT.  Patient had elevated troponins in this hospitalization in the setting of problem #1 and #2.  Felt to be demand ischemia. ? ?4.  Hyponatremia: Secondary to SIADH versus hypervolemic hyponatremia.  Improved from Na level 120->134 now.  Continue diuretics and fluid restriction. ? ?5.  Leukocytosis: Likely steroid-induced.  Continue to monitor with taper ? ?6.  Abnormal LFTs: Could be related to CHF and hepatic congestion.Avoid  hepatotoxins-azithromycin and acetaminophen held on 5/4 in view of uptrending LFTs.  USG unremarkable. ? ? ?DVT prophylaxis:  ?Code Status:  ?Family / Patient Communication:  ?Disposition Plan:   ?Status is: Inpatient ?{Inpatient:23812} ?  ?

## 2021-06-24 DIAGNOSIS — I340 Nonrheumatic mitral (valve) insufficiency: Secondary | ICD-10-CM

## 2021-06-24 DIAGNOSIS — I25810 Atherosclerosis of coronary artery bypass graft(s) without angina pectoris: Secondary | ICD-10-CM | POA: Diagnosis not present

## 2021-06-24 DIAGNOSIS — R7989 Other specified abnormal findings of blood chemistry: Secondary | ICD-10-CM

## 2021-06-24 DIAGNOSIS — D649 Anemia, unspecified: Secondary | ICD-10-CM

## 2021-06-24 DIAGNOSIS — I952 Hypotension due to drugs: Secondary | ICD-10-CM

## 2021-06-24 DIAGNOSIS — I5021 Acute systolic (congestive) heart failure: Secondary | ICD-10-CM | POA: Diagnosis not present

## 2021-06-24 DIAGNOSIS — J9601 Acute respiratory failure with hypoxia: Secondary | ICD-10-CM | POA: Diagnosis not present

## 2021-06-24 LAB — CBC
HCT: 30.3 % — ABNORMAL LOW (ref 39.0–52.0)
Hemoglobin: 10.3 g/dL — ABNORMAL LOW (ref 13.0–17.0)
MCH: 30.3 pg (ref 26.0–34.0)
MCHC: 34 g/dL (ref 30.0–36.0)
MCV: 89.1 fL (ref 80.0–100.0)
Platelets: 321 10*3/uL (ref 150–400)
RBC: 3.4 MIL/uL — ABNORMAL LOW (ref 4.22–5.81)
RDW: 14.9 % (ref 11.5–15.5)
WBC: 14.6 10*3/uL — ABNORMAL HIGH (ref 4.0–10.5)
nRBC: 0 % (ref 0.0–0.2)

## 2021-06-24 LAB — GLUCOSE, CAPILLARY
Glucose-Capillary: 127 mg/dL — ABNORMAL HIGH (ref 70–99)
Glucose-Capillary: 121 mg/dL — ABNORMAL HIGH (ref 70–99)
Glucose-Capillary: 106 mg/dL — ABNORMAL HIGH (ref 70–99)
Glucose-Capillary: 137 mg/dL — ABNORMAL HIGH (ref 70–99)
Glucose-Capillary: 145 mg/dL — ABNORMAL HIGH (ref 70–99)
Glucose-Capillary: 93 mg/dL (ref 70–99)

## 2021-06-24 LAB — COMPREHENSIVE METABOLIC PANEL
ALT: 317 U/L — ABNORMAL HIGH (ref 0–44)
AST: 150 U/L — ABNORMAL HIGH (ref 15–41)
Albumin: 2.2 g/dL — ABNORMAL LOW (ref 3.5–5.0)
Alkaline Phosphatase: 96 U/L (ref 38–126)
Anion gap: 10 (ref 5–15)
BUN: 29 mg/dL — ABNORMAL HIGH (ref 8–23)
CO2: 33 mmol/L — ABNORMAL HIGH (ref 22–32)
Calcium: 8.1 mg/dL — ABNORMAL LOW (ref 8.9–10.3)
Chloride: 95 mmol/L — ABNORMAL LOW (ref 98–111)
Creatinine, Ser: 0.6 mg/dL — ABNORMAL LOW (ref 0.61–1.24)
GFR, Estimated: >60 mL/min (ref >60)
Glucose, Bld: 122 mg/dL — ABNORMAL HIGH (ref 70–99)
Potassium: 3.3 mmol/L — ABNORMAL LOW (ref 3.5–5.1)
Sodium: 138 mmol/L (ref 135–145)
Total Bilirubin: 1.1 mg/dL (ref 0.3–1.2)
Total Protein: 6.1 g/dL — ABNORMAL LOW (ref 6.5–8.1)

## 2021-06-24 LAB — HEPATITIS PANEL, ACUTE
HCV Ab: NONREACTIVE
Hep A IgM: NONREACTIVE
Hep B C IgM: NONREACTIVE
Hepatitis B Surface Ag: NONREACTIVE

## 2021-06-24 LAB — CULTURE, RESPIRATORY W GRAM STAIN
Culture: NO GROWTH
Special Requests: NORMAL

## 2021-06-24 MED ORDER — POTASSIUM CHLORIDE CRYS ER 20 MEQ PO TBCR
20.0000 meq | EXTENDED_RELEASE_TABLET | ORAL | Status: AC
  Administered 2021-06-24 (×2): 20 meq via ORAL
  Filled 2021-06-24 (×2): qty 1

## 2021-06-24 MED ORDER — POTASSIUM CHLORIDE 10 MEQ/50ML IV SOLN
10.0000 meq | INTRAVENOUS | Status: AC
  Administered 2021-06-24 (×4): 10 meq via INTRAVENOUS
  Filled 2021-06-24 (×4): qty 50

## 2021-06-24 MED ORDER — FUROSEMIDE 10 MG/ML IJ SOLN
40.0000 mg | Freq: Every day | INTRAMUSCULAR | Status: AC
  Administered 2021-06-24 – 2021-06-25 (×2): 40 mg via INTRAVENOUS
  Filled 2021-06-24 (×2): qty 4

## 2021-06-24 MED ORDER — POTASSIUM CHLORIDE 10 MEQ/50ML IV SOLN
INTRAVENOUS | Status: AC
  Filled 2021-06-24: qty 50

## 2021-06-24 NOTE — Plan of Care (Signed)

## 2021-06-24 NOTE — Progress Notes (Signed)
?Cardiology Progress Note  ?Patient ID: Roberto Ponce ?MRN: [id number] ?DOB: Sep 13, 1950 ?Date of Encounter: 06/24/2021 ? ?Primary Cardiologist: Mertie Moores, MD ? ?Subjective  ? ?On 4 L nasal cannula.  Dyspnea improving, would like to get up around the room today. Vigorous diuresis yesterday. Sitting in bedside chair, Roberto Ponce present and provides additional history. ? ?Inpatient Medications  ?Scheduled Meds: ? aspirin EC  81 mg Oral Daily  ? brimonidine  1 drop Left Eye BID  ? carvedilol  3.125 mg Oral BID WC  ? Chlorhexidine Gluconate Cloth  6 each Topical Daily  ? clopidogrel  75 mg Oral Q breakfast  ? dapagliflozin propanediol  10 mg Oral Daily  ? docusate sodium  100 mg Oral BID  ? dorzolamide-timolol  1 drop Both Eyes BID  ? enoxaparin (LOVENOX) injection  40 mg Subcutaneous Q24H  ? ezetimibe  10 mg Oral QHS  ? insulin aspart  0-15 Units Subcutaneous TID WC  ? latanoprost  1 drop Both Eyes QHS  ? losartan  12.5 mg Oral Daily  ? mouth rinse  15 mL Mouth Rinse BID  ? melatonin  3 mg Oral QHS  ? pantoprazole  40 mg Oral Daily  ? polyethylene glycol  17 g Oral Daily  ? potassium chloride  20 mEq Oral Q4H  ? sodium chloride flush  10-40 mL Intracatheter Q12H  ? sodium chloride flush  3 mL Intravenous Q12H  ? sodium chloride  1 g Oral BID WC  ? spironolactone  12.5 mg Oral Daily  ? ?Continuous Infusions: ? sodium chloride Stopped (06/20/21 0222)  ? sodium chloride Stopped (06/20/21 2019)  ? potassium chloride    ? ?PRN Meds: ?sodium chloride, albuterol, nitroGLYCERIN, ondansetron **OR** ondansetron (ZOFRAN) IV, sodium chloride flush  ? ?Vital Signs  ? ?Vitals:  ? 06/24/21 0500 06/24/21 0550 06/24/21 0600 06/24/21 0700  ?BP: 113/62  (!) 127/54 137/63  ?Pulse: (!) 57 72 70 76  ?Resp: 14 (!) 21 (!) 25 (!) 25  ?Temp:      ?TempSrc:      ?SpO2: 98% 96% 96% 97%  ?Weight: 73.8 kg     ?Height:      ? ? ?Intake/Output Summary (Last 24 hours) at 06/24/2021 3235 ?Last data filed at 06/24/2021 0600 ?Gross per 24 hour  ?Intake  620 ml  ?Output 4700 ml  ?Net -4080 ml  ? ? ?  06/24/2021  ?  5:00 AM 06/23/2021  ?  4:41 AM 06/22/2021  ?  5:00 AM  ?Last 3 Weights  ?Weight (lbs) 162 lb 11.2 oz 164 lb 10.9 oz 169 lb 8.5 oz  ?Weight (kg) 73.8 kg 74.7 kg 76.9 kg  ?   ? ?Telemetry  ?Overnight telemetry SR in 80s with occasional PVCs, which I personally reviewed.  ? ?Physical Exam  ? ?Vitals:  ? 06/24/21 0500 06/24/21 0550 06/24/21 0600 06/24/21 0700  ?BP: 113/62  (!) 127/54 137/63  ?Pulse: (!) 57 72 70 76  ?Resp: 14 (!) 21 (!) 25 (!) 25  ?Temp:      ?TempSrc:      ?SpO2: 98% 96% 96% 97%  ?Weight: 73.8 kg     ?Height:      ?  ?Intake/Output Summary (Last 24 hours) at 06/24/2021 5732 ?Last data filed at 06/24/2021 0600 ?Gross per 24 hour  ?Intake 620 ml  ?Output 4700 ml  ?Net -4080 ml  ?  ? ?  06/24/2021  ?  5:00 AM 06/23/2021  ?  4:41 AM 06/22/2021  ?  5:00 AM  ?Last 3 Weights  ?Weight (lbs) 162 lb 11.2 oz 164 lb 10.9 oz 169 lb 8.5 oz  ?Weight (kg) 73.8 kg 74.7 kg 76.9 kg  ?  Body mass index is 24.03 kg/m?.  ?General: Well nourished, well developed, in no acute distress ?Head: Atraumatic, normal size  ?Eyes: PEERLA, EOMI  ?Neck: Supple, JVP normal. ?Endocrine: No thryomegaly ?Cardiac: Normal S1, S2; RRR; 2 out of 6 holosystolic murmur heard best at left upper sternal border ?Lungs: clear bilaterally ?Abd: Soft, nontender ?Ext: No edema ?Musculoskeletal: No deformities, BUE and BLE strength normal and equal ?Skin: Warm and dry, no rashes   ?Neuro: Alert and oriented to person, place, time, and situation, no focal deficits  ?Psych: Normal mood and affect  ? ?Labs  ?High Sensitivity Troponin:   ?Recent Labs  ?Lab 06/20/21 ?1723 06/21/21 ?0500  ?TROPONINIHS 61* 16  ?   ?Cardiac EnzymesNo results for input(s): TROPONINI in the last 168 hours. No results for input(s): TROPIPOC in the last 168 hours.  ?Chemistry ?Recent Labs  ?Lab 06/22/21 ?0412 06/23/21 ?9449 06/24/21 ?6759  ?NA 132* 134* 138  ?K 3.5 3.6 3.3*  ?CL 99 95* 95*  ?CO2 27 29 33*  ?GLUCOSE 143* 154* 122*  ?BUN  13 21 29*  ?CREATININE 0.54* 0.55* 0.60*  ?CALCIUM 7.6* 8.1* 8.1*  ?PROT 5.3* 5.9* 6.1*  ?ALBUMIN 2.0* 2.3* 2.2*  ?AST 321* 321* 150*  ?ALT 297* 414* 317*  ?ALKPHOS 83 107 96  ?BILITOT 0.7 1.4* 1.1  ?GFRNONAA >60 >60 >60  ?ANIONGAP _0 ?  ?Hematology ?Recent Labs  ?Lab 06/22/21 ?0412 06/23/21 ?1638 06/24/21 ?4665  ?WBC 17.7* 16.3* 14.6*  ?RBC 3.13* 3.55* 3.40*  ?HGB 9.5* 10.6* 10.3*  ?HCT 27.8* 31.6* 30.3*  ?MCV 88.8 89.0 89.1  ?MCH 30.4 29.9 30.3  ?MCHC 34.2 33.5 34.0  ?RDW 14.4 14.4 14.9  ?PLT 259 303 321  ? ?BNP ?Recent Labs  ?Lab 06/20/21 ?1723 06/21/21 ?0358 06/23/21 ?9935  ?BNP 667.6* 514.9* 229.2*  ?  ?DDimer No results for input(s): DDIMER in the last 168 hours.  ? ?Radiology  ?DG Chest Port 1 View ? ?Result Date: 06/23/2021 ?CLINICAL DATA:  Pulmonary edema EXAM: PORTABLE CHEST 1 VIEW COMPARISON:  Radiograph 06/21/2021 FINDINGS: Removal of endotracheal and orogastric tubes. There is focal narrowing of the upper trachea. Unchanged cardiomediastinal silhouette with prior median sternotomy and postsurgical changes of CABG. There is persistent diffuse airspace disease, slightly improved in the lung bases. Small bilateral pleural effusions. No pneumothorax. No acute osseous abnormality. IMPRESSION: Persistent diffuse airspace disease, slightly improved in the lung bases. Stable small effusions. Removal of endotracheal tube with notable focal narrowing of the upper trachea. Electronically Signed   By: Maurine Simmering M.D.   On: 06/23/2021 08:43  ? ?US Abdomen Limited RUQ (LIVER/GB) ? ?Result Date: 06/23/2021 ?CLINICAL DATA:  Elevated liver function tests EXAM: ULTRASOUND ABDOMEN LIMITED RIGHT UPPER QUADRANT COMPARISON:  CT 03/16/2021 FINDINGS: Gallbladder: Surgically absent. Common bile duct: Diameter: 3 mm. Liver: No focal lesion identified. Within normal limits in parenchymal echogenicity. Portal vein is patent on color Doppler imaging with normal direction of blood flow towards the liver. Other: None. IMPRESSION:  Unremarkable right upper quadrant ultrasound status post cholecystectomy. Electronically Signed   By: Davina Poke D.O.   On: 06/23/2021 10:09   ? ?Cardiac Studies  ?TTE 06/20/2021 ? 1. Left ventricular ejection fraction, by estimation, is 40 to 45%. The  ?left ventricle has mildly decreased function. The left ventricle  ?  demonstrates regional wall motion abnormalities (see scoring  ?diagram/findings for description). There is mild left  ?ventricular hypertrophy. Left ventricular diastolic parameters are  ?consistent with Grade II diastolic dysfunction (pseudonormalization).  ?Elevated left atrial pressure.  ? 2. Right ventricular systolic function is mildly reduced. The right  ?ventricular size is mildly enlarged. There is moderately elevated  ?pulmonary artery systolic pressure. The estimated right ventricular  ?systolic pressure is 74.7 mmHg.  ? 3. Tricuspid valve regurgitation is mild to moderate.  ? 4. The aortic valve is tricuspid. Aortic valve regurgitation is not  ?visualized. Aortic valve sclerosis is present, with no evidence of aortic  ?valve stenosis.  ? 5. The inferior vena cava is dilated in size with <50% respiratory  ?variability, suggesting right atrial pressure of 15 mmHg.  ? 6. The mitral valve is abnormal. Moderate mitral valve regurgitation.  ?Eccentric posterior directed MR jet. Difficult to quantify severity given  ?eccentric jet, but normal LA/LV size argues against severe MR. Consider  ?TEE or cardiac MRI for further  ?evaluation  ? ?Patient Profile  ?Roberto Ponce is a 71 y.o. male with CAD status post CABG, recent PCI, severe mitral valve regurgitation, testicular cancer status post orchiectomy who was admitted on 06/18/2021 for acute hypoxic respiratory failure secondary to pneumonia.  Cardiology consulted due to history of severe mitral valve regurgitation. ? ?Assessment & Plan  ? ?#Acute hypoxic respiratory failure ?#Pneumonia ?#Acute systolic heart failure, 40 to 45% ?#Moderate  to severe mitral valve regurgitation ?-Admitted with hypoxic respiratory failure with concerns for pneumonia.  Briefly intubated and has been extubated. Underwent BAL.  This is negative for infection

## 2021-06-24 NOTE — Evaluation (Signed)
Physical Therapy Evaluation ?Patient Details ?Name: Roberto Ponce ?MRN: [id number] ?DOB: [birth year] ?Today's Date: 06/24/2021 ? ?History of Present Illness ? XAIVIER Ponce is a 71 y.o. male with medical history significant of CAD, MI s/p CABG, HLD, recent testicular seminoma s/p orchectomy presented to ED with a complaint of cough/shortness of breath.  Admitted under hospitalist service with community-acquired pneumonia Intubated 5/2-5/05-2021.  ?Clinical Impression ? The patient is eager to ambulate. Patient ambulated x 200' using RW, on 3 L [place]. SPO2 96% at rest, Dyspnea 2-3/4 when ambulating and talking. (Drop to 87% at  end of distance with return to > 90% with PLB. HR range 90's ? Patient should progress to return home with family support. ? Pt admitted with above diagnosis.  Pt currently with functional limitations due to the deficits listed below (see PT Problem List). Pt will benefit from skilled PT to increase their independence and safety with mobility to allow discharge to the venue listed below.   ?.  ?   ? ?Recommendations for follow up therapy are one component of a multi-disciplinary discharge planning process, led by the attending physician.  Recommendations may be updated based on patient status, additional functional criteria and insurance authorization. ? ?Follow Up Recommendations Home health PT ? ?  ?Assistance Recommended at Discharge Set up Supervision/Assistance  ?Patient can return home with the following ? A little help with walking and/or transfers;A little help with bathing/dressing/bathroom;Help with stairs or ramp for entrance;Assistance with cooking/housework;Assist for transportation ? ?  ?Equipment Recommendations  (TBD)  ?Recommendations for Other Services ?    ?  ?Functional Status Assessment Patient has had a recent decline in their functional status and demonstrates the ability to make significant improvements in function in a reasonable and predictable amount of time.  ? ?   ?Precautions / Restrictions Precautions ?Precaution Comments: monito VS ?Restrictions ?Weight Bearing Restrictions: No  ? ?  ? ?Mobility ? Bed Mobility ?  ?  ?  ?  ?  ?  ?  ?General bed mobility comments: in recliner ?  ? ?Transfers ?Overall transfer level: Needs assistance ?Equipment used: Rolling walker (2 wheels) ?Transfers: Sit to/from Stand ?Sit to Stand: Supervision ?  ?  ?  ?  ?  ?  ?  ? ?Ambulation/Gait ?Ambulation/Gait assistance: Min guard ?Gait Distance (Feet): 200 Feet ?Assistive device: Rolling walker (2 wheels) ?Gait Pattern/deviations: Step-through pattern ?Gait velocity: decr ?  ?  ?General Gait Details: gait steady,  stop for 2 rest breaks standing, PLB ? ?Stairs ?  ?  ?  ?  ?  ? ?Wheelchair Mobility ?  ? ?Modified Rankin (Stroke Patients Only) ?  ? ?  ? ?Balance Overall balance assessment: Mild deficits observed, not formally tested ?  ?  ?  ?  ?  ?  ?  ?  ?  ?  ?  ?  ?  ?  ?  ?  ?  ?  ?   ? ? ? ?Pertinent Vitals/Pain Pain Assessment ?Pain Assessment: No/denies pain  ? ? ?Home Living Family/patient expects to be discharged to:: Private residence ?Living Arrangements: Spouse/significant other ?Available Help at Discharge: Family ?Type of Home: House ?Home Access: Stairs to enter ?  ?Entrance Stairs-Number of Steps: 1 ?  ?Home Layout: One level ?Home Equipment: None ?   ?  ?Prior Function Prior Level of Function : Independent/Modified Independent ?  ?  ?  ?  ?  ?  ?  ?  ?  ? ? ?  Hand Dominance  ? Dominant Hand: Right ? ?  ?Extremity/Trunk Assessment  ? Upper Extremity Assessment ?Upper Extremity Assessment: Overall WFL for tasks assessed ?  ? ?Lower Extremity Assessment ?Lower Extremity Assessment: Overall WFL for tasks assessed ?  ? ?Cervical / Trunk Assessment ?Cervical / Trunk Assessment: Normal  ?Communication  ? Communication: No difficulties  ?Cognition Arousal/Alertness: Awake/alert ?Behavior During Therapy: [facility] for tasks assessed/performed ?Overall Cognitive Status: Within Functional Limits  for tasks assessed ?  ?  ?  ?  ?  ?  ?  ?  ?  ?  ?  ?  ?  ?  ?  ?  ?  ?  ?  ? ?  ?General Comments   ? ?  ?Exercises    ? ?Assessment/Plan  ?  ?PT Assessment Patient needs continued PT services  ?PT Problem List Decreased strength;Decreased mobility;Decreased activity tolerance;Decreased knowledge of use of DME;Cardiopulmonary status limiting activity ? ?   ?  ?PT Treatment Interventions DME instruction;Therapeutic activities;Gait training;Therapeutic exercise;Patient/family education;Functional mobility training   ? ?PT Goals (Current goals can be found in the Care Plan section)  ?Acute Rehab PT Goals ?Patient Stated Goal: to  get back to exercising ?PT Goal Formulation: With patient/family ?Time For Goal Achievement: 07/08/21 ?Potential to Achieve Goals: Good ? ?  ?Frequency Min 3X/week ?  ? ? ?Co-evaluation   ?  ?  ?  ?  ? ? ?  ?AM-PAC PT "6 Clicks" Mobility  ?Outcome Measure Help needed turning from your back to your side while in a flat bed without using bedrails?: A Little ?Help needed moving from lying on your back to sitting on the side of a flat bed without using bedrails?: A Little ?Help needed moving to and from a bed to a chair (including a wheelchair)?: A Little ?Help needed standing up from a chair using your arms (e.g., wheelchair or bedside chair)?: A Little ?Help needed to walk in hospital room?: A Little ?Help needed climbing 3-5 steps with a railing? : A Lot ?6 Click Score: 17 ? ?  ?End of Session Equipment Utilized During Treatment: Gait belt;Oxygen ?Activity Tolerance: Patient tolerated treatment well ?Patient left: in chair;with call bell/phone within reach;with family/visitor present ?Nurse Communication: Mobility status ?PT Visit Diagnosis: Difficulty in walking, not elsewhere classified (R26.2) ?  ? ?Time: 2440-1027 ?PT Time Calculation (min) (ACUTE ONLY): 30 min ? ? ?Charges:   PT Evaluation ?$PT Eval Low Complexity: 1 Low ?PT Treatments ?$Gait Training: 8-22 mins ?  ?   ? ? ?Tresa Endo  PT ?Acute Rehabilitation Services ?Pager [phone] ?Office [phone] ? ? ?Claretha Cooper ?06/24/2021, 4:27 PM ? ?

## 2021-06-24 NOTE — Progress Notes (Signed)
[facility] ADULT ICU REPLACEMENT PROTOCOL ? ? ?The patient does apply for the [facility] Adult ICU Electrolyte Replacment Protocol based on the criteria listed below:  ? ?1.Exclusion criteria: TCTS patients, ECMO patients, and Dialysis patients ?2. Is GFR >/= 30 ml/min? Yes.    ?Patient's GFR today is >60 ?3. Is SCr </= 2? Yes.   ?Patient's SCr is 0.60 mg/dL ?4. Did SCr increase >/= 0.5 in 24 hours? No. ?5.Pt's weight >40kg  Yes.   ?6. Abnormal electrolyte(s): K+ 3.3  ?7. Electrolytes replaced per protocol ?8.  Call MD STAT for K+ </= 2.5, Phos </= 1, or Mag </= 1 ?Physician:  Dr. Oletta Darter ? ?Roberto Ponce 06/24/2021 6:25 AM  ?

## 2021-06-24 NOTE — Plan of Care (Signed)

## 2021-06-24 NOTE — Progress Notes (Addendum)
?PROGRESS NOTE ? ? ? ?[name]  ?IOE:[id number]  ?DOB: 30-Nov-1950  ?PCP: Kristen Loader, FNP ?Admit date:06/18/2021 ?Chief compliant: Shortness of breath ?71 year old male with history of CAD s/p CABG and s/p recent PTCA, mitral valve prolapse, recently diagnosed testicular cancer, s/p right radical inguinal orchiectomy initially presented to [facility] ED on 4/27 with complaints of cough, SOB, orthopnea,diagnosed with viral pneumonia and discharged.  Patient presented back to the ED with complaints of worsening dyspnea ?Hospital course: Patient admitted to [facility] for above complaints and treated initially as community-acquired pneumonia and improved, subsequently got worse requiring BiPAP.  Seen by PCCM and needed ICU transfer/mechanical ventilation.  Aggressively diuresed for acute exacerbation of systolic CHF in the setting of moderate-severe mitral valve regurgitation.  Viral panel and bacterial infection work-up negative.  Autoimmune work-up Negative.  Echo showed EF 40 to 45%.  Eventually extubated and transferred out of ICU to [facility] service on 5/5. Cardiology following.  ? ? ?Subjective: Patient sitting in the chair comfortably.  Down to  3L nasal cannula when seen this afternoon.  Physical therapy had just ambulated him down the hallway and did well with 3 L nasal cannula with only 1 time dropped to 87% but no dyspnea.  Family at bedside. ? ? ?Objective: ?Vitals:  ? 06/24/21 1632 06/24/21 1700 06/24/21 1800 06/24/21 1900  ?BP: 123/63 118/69 139/71 (!) 114/52  ?Pulse: 66 69 73 79  ?Resp: (!) 24 (!) 21 (!) 24 (!) 25  ?Temp:      ?TempSrc:      ?SpO2: 96% 92% (!) 87% 96%  ?Weight:      ?Height:      ? ? ?Intake/Output Summary (Last 24 hours) at 06/24/2021 1919 ?Last data filed at 06/24/2021 1800 ?Gross per 24 hour  ?Intake 910.92 ml  ?Output 1300 ml  ?Net -389.08 ml  ? ?Filed Weights  ? 06/22/21 0500 06/23/21 0441 06/24/21 0500  ?Weight: 76.9 kg 74.7 kg 73.8 kg  ? ? ?Physical Examination: ? ?General: Moderately  built, no acute distress noted ?Head ENT: Atraumatic normocephalic, PERRLA, neck supple ?Heart: S1-S2 heard, regular rate and rhythm, no murmurs.  No leg edema noted ?Lungs: Equal air entry bilaterally, no rhonchi, mild left basal rales on exam, no accessory muscle use ?Abdomen: Bowel sounds heard, soft, nontender, nondistended. No organomegaly.  No CVA tenderness ?Extremities: Trace pedal edema.  No cyanosis or clubbing. ?Neurological: Awake alert oriented x3, no focal weakness or numbness, strength and sensations to crude touch intact ?Skin: No wounds or rashes.  ? ?Data Reviewed: I have personally reviewed following labs and imaging studies ? ?CBC: ?Recent Labs  ?Lab 06/18/21 ?1457 06/18/21 ?1735 06/20/21 ?0409 06/20/21 ?2002 06/21/21 ?1829 06/22/21 ?9371 06/23/21 ?6967 06/24/21 ?8938  ?WBC 11.1*   < > 14.3* 19.2* 16.4* 17.7* 16.3* 14.6*  ?NEUTROABS 8.8*  --  12.3*  --   --  15.6*  --   --   ?HGB 11.1*   < > 10.5* 10.6* 10.2* 9.5* 10.6* 10.3*  ?HCT 31.4*   < > 30.1* 29.5* 29.4* 27.8* 31.6* 30.3*  ?MCV 85.1   < > 86.0 84.5 85.5 88.8 89.0 89.1  ?PLT 166   < > 191 282 271 259 303 321  ? < > = values in this interval not displayed.  ? ?Basic Metabolic Panel: ?Recent Labs  ?Lab 06/18/21 ?1735 06/19/21 ?0429 06/20/21 ?2002 06/20/21 ?2003 06/21/21 ?0358 06/21/21 ?1350 06/21/21 ?1750 06/22/21 ?1017 06/23/21 ?5102 06/24/21 ?5852  ?NA  --    < >  125*  --  127* 129* 128* 132* 134* 138  ?K  --    < > 3.4*  --  4.1  --   --  3.5 3.6 3.3*  ?CL  --    < > 92*  --  97*  --   --  99 95* 95*  ?CO2  --    < > 22  --  24  --   --  27 29 33*  ?GLUCOSE  --    < > 145*  --  193*  --   --  143* 154* 122*  ?BUN  --    < > 7*  --  10  --   --  13 21 29*  ?CREATININE 0.56*   < > 0.47*  --  0.53*  --   --  0.54* 0.55* 0.60*  ?CALCIUM  --    < > 7.8*  --  7.9*  --   --  7.6* 8.1* 8.1*  ?MG 1.6*  --   --  1.7 2.3  --   --  2.3  --   --   ?PHOS  --   --   --  3.1  --   --   --   --   --   --   ? < > = values in this interval not displayed.   ? ?GFR: ?Estimated Creatinine Clearance: 85.9 mL/min (A) (by C-G formula based on SCr of 0.6 mg/dL (L)). ?Liver Function Tests: ?Recent Labs  ?Lab 06/20/21 ?2002 06/21/21 ?0358 06/22/21 ?5284 06/23/21 ?1324 06/24/21 ?4010  ?AST 195* 152* 321* 321* 150*  ?ALT 186* 171* 297* 414* 317*  ?ALKPHOS 82 84 83 107 96  ?BILITOT 1.6* 0.8 0.7 1.4* 1.1  ?PROT 6.0* 5.8* 5.3* 5.9* 6.1*  ?ALBUMIN 2.4* 2.3* 2.0* 2.3* 2.2*  ? ?No results for input(s): LIPASE, AMYLASE in the last 168 hours. ?No results for input(s): AMMONIA in the last 168 hours. ?Coagulation Profile: ?Recent Labs  ?Lab 06/23/21 ?2725  ?INR 1.2  ? ?Cardiac Enzymes: ?No results for input(s): CKTOTAL, CKMB, CKMBINDEX, TROPONINI in the last 168 hours. ?BNP (last 3 results) ?No results for input(s): PROBNP in the last 8760 hours. ?HbA1C: ?No results for input(s): HGBA1C in the last 72 hours. ?CBG: ?Recent Labs  ?Lab 06/23/21 ?2310 06/24/21 ?0400 06/24/21 ?0730 06/24/21 ?1211 06/24/21 ?1528  ?GLUCAP 130* 127* 121* 106* 137*  ? ?Lipid Profile: ?No results for input(s): CHOL, HDL, LDLCALC, TRIG, CHOLHDL, LDLDIRECT in the last 72 hours. ?Thyroid Function Tests: ?No results for input(s): TSH, T4TOTAL, FREET4, T3FREE, THYROIDAB in the last 72 hours. ?Anemia Panel: ?No results for input(s): VITAMINB12, FOLATE, FERRITIN, TIBC, IRON, RETICCTPCT in the last 72 hours. ?Sepsis Labs: ?Recent Labs  ?Lab 06/18/21 ?1735 06/20/21 ?0806 06/20/21 ?1034 06/21/21 ?0358  ?PROCALCITON <0.10 0.15  --   --   ?LATICACIDVEN 1.2 1.4 1.4 1.4  ? ? ?Recent Results (from the past 240 hour(s))  ?Expectorated Sputum Assessment w Gram Stain, Rflx to Resp Cult     Status: None  ? Collection Time: 06/18/21 12:02 AM  ? Specimen: Sputum  ?Result Value Ref Range Status  ? Specimen Description SPUTUM  Final  ? Special Requests NONE  Final  ? Sputum evaluation   Final  ?  THIS SPECIMEN IS ACCEPTABLE FOR SPUTUM CULTURE ?Performed at [name], [place] [address], [place], [place] [address] ?  ?  Report Status 06/19/2021 FINAL  Final  ?Culture, Respiratory w Gram Stain  Status: None  ? Collection Time: 06/18/21 12:02 AM  ? Specimen: SPU  ?Result Value Ref Range Status  ? Specimen Description   Final  ?  SPUTUM ?Performed at [facility], [place] [address] [place], [place] [address] ?  ? Special Requests   Final  ?  NONE Reflexed from [id number] ?Performed at [facility], [place] [address] [place], [place] [address] ?  ? Gram Stain   Final  ?  ABUNDANT WBC PRESENT, PREDOMINANTLY PMN ?NO ORGANISMS SEEN ?Performed at [facility], [place] [address], [place], [place] [address] ?  ? Culture RARE CANDIDA ALBICANS  Final  ? Report Status 06/21/2021 FINAL  Final  ?Resp Panel by RT-PCR (Flu A&B, Covid) Nasopharyngeal Swab     Status: None  ? Collection Time: 06/18/21  2:57 PM  ? Specimen: Nasopharyngeal Swab; Nasopharyngeal(NP) swabs in vial transport medium  ?Result Value Ref Range Status  ? SARS Coronavirus 2 by RT PCR NEGATIVE NEGATIVE Final  ?  Comment: (NOTE) ?SARS-CoV-2 target nucleic acids are NOT DETECTED. ? ?The SARS-CoV-2 RNA is generally detectable in upper respiratory ?specimens during the acute phase of infection. The lowest ?concentration of SARS-CoV-2 viral copies this assay can detect is ?138 copies/mL. A negative result does not preclude SARS-Cov-2 ?infection and should not be used as the sole basis for treatment or ?other patient management decisions. A negative result may occur with  ?improper specimen collection/handling, submission of specimen other ?than nasopharyngeal swab, presence of viral mutation(s) within the ?areas targeted by this assay, and inadequate number of viral ?copies(<138 copies/mL). A negative result must be combined with ?clinical observations, patient history, and epidemiological ?information. The expected result is Negative. ? ?Fact Sheet for Patients:  ?EntrepreneurPulse.com.au ? ?Fact Sheet for Healthcare Providers:   ?IncredibleEmployment.be ? ?This test is no t yet approved or cleared by the [place] FDA and  ?has been authorized for detection and/or diagnosis of SARS-CoV-2 by ?FDA under an [employer]

## 2021-06-25 DIAGNOSIS — I25810 Atherosclerosis of coronary artery bypass graft(s) without angina pectoris: Secondary | ICD-10-CM | POA: Diagnosis not present

## 2021-06-25 DIAGNOSIS — E7849 Other hyperlipidemia: Secondary | ICD-10-CM

## 2021-06-25 DIAGNOSIS — I5021 Acute systolic (congestive) heart failure: Secondary | ICD-10-CM | POA: Diagnosis not present

## 2021-06-25 DIAGNOSIS — R7989 Other specified abnormal findings of blood chemistry: Secondary | ICD-10-CM | POA: Diagnosis not present

## 2021-06-25 DIAGNOSIS — J9601 Acute respiratory failure with hypoxia: Secondary | ICD-10-CM | POA: Diagnosis not present

## 2021-06-25 DIAGNOSIS — D72829 Elevated white blood cell count, unspecified: Secondary | ICD-10-CM

## 2021-06-25 LAB — COMPREHENSIVE METABOLIC PANEL
ALT: 233 U/L — ABNORMAL HIGH (ref 0–44)
AST: 77 U/L — ABNORMAL HIGH (ref 15–41)
Albumin: 2.4 g/dL — ABNORMAL LOW (ref 3.5–5.0)
Alkaline Phosphatase: 87 U/L (ref 38–126)
Anion gap: 6 (ref 5–15)
BUN: 25 mg/dL — ABNORMAL HIGH (ref 8–23)
CO2: 33 mmol/L — ABNORMAL HIGH (ref 22–32)
Calcium: 8 mg/dL — ABNORMAL LOW (ref 8.9–10.3)
Chloride: 96 mmol/L — ABNORMAL LOW (ref 98–111)
Creatinine, Ser: 0.66 mg/dL (ref 0.61–1.24)
GFR, Estimated: >60 mL/min (ref >60)
Glucose, Bld: 86 mg/dL (ref 70–99)
Potassium: 3.2 mmol/L — ABNORMAL LOW (ref 3.5–5.1)
Sodium: 135 mmol/L (ref 135–145)
Total Bilirubin: 0.9 mg/dL (ref 0.3–1.2)
Total Protein: 6 g/dL — ABNORMAL LOW (ref 6.5–8.1)

## 2021-06-25 LAB — CBC
HCT: 31.6 % — ABNORMAL LOW (ref 39.0–52.0)
Hemoglobin: 10.6 g/dL — ABNORMAL LOW (ref 13.0–17.0)
MCH: 30 pg (ref 26.0–34.0)
MCHC: 33.5 g/dL (ref 30.0–36.0)
MCV: 89.5 fL (ref 80.0–100.0)
Platelets: 336 10*3/uL (ref 150–400)
RBC: 3.53 MIL/uL — ABNORMAL LOW (ref 4.22–5.81)
RDW: 15.1 % (ref 11.5–15.5)
WBC: 15.6 10*3/uL — ABNORMAL HIGH (ref 4.0–10.5)
nRBC: 0 % (ref 0.0–0.2)

## 2021-06-25 LAB — GLUCOSE, CAPILLARY
Glucose-Capillary: 89 mg/dL (ref 70–99)
Glucose-Capillary: 74 mg/dL (ref 70–99)
Glucose-Capillary: 69 mg/dL — ABNORMAL LOW (ref 70–99)
Glucose-Capillary: 122 mg/dL — ABNORMAL HIGH (ref 70–99)
Glucose-Capillary: 83 mg/dL (ref 70–99)
Glucose-Capillary: 150 mg/dL — ABNORMAL HIGH (ref 70–99)

## 2021-06-25 MED ORDER — POTASSIUM CHLORIDE CRYS ER 20 MEQ PO TBCR
40.0000 meq | EXTENDED_RELEASE_TABLET | Freq: Two times a day (BID) | ORAL | Status: AC
  Administered 2021-06-25 – 2021-06-26 (×4): 40 meq via ORAL
  Filled 2021-06-25 (×4): qty 2

## 2021-06-25 MED ORDER — ORAL CARE MOUTH RINSE
15.0000 mL | Freq: Two times a day (BID) | OROMUCOSAL | Status: DC
  Administered 2021-06-25 – 2021-06-27 (×5): 15 mL via OROMUCOSAL

## 2021-06-25 MED ORDER — ORAL CARE MOUTH RINSE: 15.0000 mL | Freq: Two times a day (BID) | OROMUCOSAL | Status: DC

## 2021-06-25 MED ORDER — FUROSEMIDE 40 MG PO TABS: 40.0000 mg | ORAL_TABLET | Freq: Every day | ORAL | Status: DC

## 2021-06-25 MED ORDER — FUROSEMIDE 40 MG PO TABS
40.0000 mg | ORAL_TABLET | Freq: Two times a day (BID) | ORAL | Status: DC
Start: 2021-06-25 — End: 2021-06-26
  Administered 2021-06-25 – 2021-06-26 (×2): 40 mg via ORAL
  Filled 2021-06-25 (×2): qty 1

## 2021-06-25 MED ORDER — POTASSIUM CHLORIDE CRYS ER 20 MEQ PO TBCR: 40.0000 meq | EXTENDED_RELEASE_TABLET | Freq: Two times a day (BID) | ORAL | Status: DC

## 2021-06-25 MED ORDER — CHLORHEXIDINE GLUCONATE 0.12 % MT SOLN: 15.0000 mL | Freq: Two times a day (BID) | OROMUCOSAL | Status: DC

## 2021-06-25 MED ORDER — POTASSIUM CHLORIDE 20 MEQ PO PACK
40.0000 meq | PACK | Freq: Once | ORAL | Status: AC
  Administered 2021-06-25: 40 meq via ORAL
  Filled 2021-06-25: qty 2

## 2021-06-25 MED ORDER — CHLORHEXIDINE GLUCONATE 0.12 % MT SOLN
15.0000 mL | Freq: Two times a day (BID) | OROMUCOSAL | Status: DC
  Administered 2021-06-25: 15 mL via OROMUCOSAL
  Filled 2021-06-25: qty 15

## 2021-06-25 MED ORDER — TRAMADOL HCL 50 MG PO TABS
25.0000 mg | ORAL_TABLET | Freq: Once | ORAL | Status: AC
  Administered 2021-06-25: 25 mg via ORAL
  Filled 2021-06-25: qty 1

## 2021-06-25 NOTE — Progress Notes (Signed)
?PROGRESS NOTE ? ? ? ?Roberto Ponce  ?YNW:[id number]  ?DOB: May 18, 1950  ?PCP: Kristen Loader, FNP ?Admit date:06/18/2021 ?Chief compliant: Shortness of breath ?71 year old male with history of CAD s/p CABG and s/p recent PTCA, mitral valve prolapse, recently diagnosed testicular cancer, s/p right radical inguinal orchiectomy initially presented to [facility] ED on 4/27 with complaints of cough, SOB, orthopnea,diagnosed with viral pneumonia and discharged.  Patient presented back to the ED with complaints of worsening dyspnea ?Hospital course: Patient admitted to [facility] for above complaints and treated initially as community-acquired pneumonia and improved, subsequently got worse requiring BiPAP.  Seen by PCCM and needed ICU transfer/mechanical ventilation.  Aggressively diuresed for acute exacerbation of systolic CHF in the setting of moderate-severe mitral valve regurgitation.  Viral panel and bacterial infection work-up negative.  Autoimmune work-up Negative.  Echo showed EF 40 to 45%.  Eventually extubated and transferred out of ICU to [facility] service on 5/5. Cardiology following.  ? ? ?Subjective: Patient now on telemetry floor.  O2 down to 2 L and saturating well.  Seen by cardiology ? ? ?Objective: ?Vitals:  ? 06/25/21 0235 06/25/21 0319 06/25/21 0500 06/25/21 1222  ?BP: 136/74   (!) 107/57  ?Pulse: 64 65  78  ?Resp: '18 18  14  '$ ?Temp: 97.6 ?F (36.4 ?C)   98.4 ?F (36.9 ?C)  ?TempSrc: Oral   Oral  ?SpO2: 96% 95%  99%  ?Weight:   79.1 kg   ?Height:      ? ? ?Intake/Output Summary (Last 24 hours) at 06/25/2021 1742 ?Last data filed at 06/25/2021 1700 ?Gross per 24 hour  ?Intake 850 ml  ?Output 1810 ml  ?Net -960 ml  ? ? ?Filed Weights  ? 06/23/21 0441 06/24/21 0500 06/25/21 0500  ?Weight: 74.7 kg 73.8 kg 79.1 kg  ? ? ?Physical Examination: ? ?General: Moderately built, no acute distress noted ?Head ENT: Atraumatic normocephalic, PERRLA, neck supple ?Heart: S1-S2 heard, regular rate and rhythm, no murmurs.  Improving trace leg  edema  ?Lungs: Equal air entry bilaterally, no rhonchi, mild left basal rales on exam, no accessory muscle use ?Abdomen: Bowel sounds heard, soft, nontender, nondistended. No organomegaly.  No CVA tenderness ?Extremities: Trace pedal edema.  No cyanosis or clubbing. ?Neurological: Awake alert oriented x3, no focal weakness or numbness, strength and sensations to crude touch intact ?Skin: No wounds or rashes.  ? ?Data Reviewed: I have personally reviewed following labs and imaging studies ? ?CBC: ?Recent Labs  ?Lab 06/20/21 ?0409 06/20/21 ?2002 06/21/21 ?6578 06/22/21 ?4696 06/23/21 ?2952 06/24/21 ?8413 06/25/21 ?2440  ?WBC 14.3*   < > 16.4* 17.7* 16.3* 14.6* 15.6*  ?NEUTROABS 12.3*  --   --  15.6*  --   --   --   ?HGB 10.5*   < > 10.2* 9.5* 10.6* 10.3* 10.6*  ?HCT 30.1*   < > 29.4* 27.8* 31.6* 30.3* 31.6*  ?MCV 86.0   < > 85.5 88.8 89.0 89.1 89.5  ?PLT 191   < > 271 259 303 321 336  ? < > = values in this interval not displayed.  ? ? ?Basic Metabolic Panel: ?Recent Labs  ?Lab 06/20/21 ?2003 06/21/21 ?0358 06/21/21 ?1350 06/21/21 ?1750 06/22/21 ?1027 06/23/21 ?2536 06/24/21 ?6440 06/25/21 ?3474  ?NA  --  127*   < > 128* 132* 134* 138 135  ?K  --  4.1  --   --  3.5 3.6 3.3* 3.2*  ?CL  --  97*  --   --  99 95* 95*  96*  ?CO2  --  24  --   --  27 29 33* 33*  ?GLUCOSE  --  193*  --   --  143* 154* 122* 86  ?BUN  --  10  --   --  13 21 29* 25*  ?CREATININE  --  0.53*  --   --  0.54* 0.55* 0.60* 0.66  ?CALCIUM  --  7.9*  --   --  7.6* 8.1* 8.1* 8.0*  ?MG 1.7 2.3  --   --  2.3  --   --   --   ?PHOS 3.1  --   --   --   --   --   --   --   ? < > = values in this interval not displayed.  ? ? ?GFR: ?Estimated Creatinine Clearance: 85.9 mL/min (by C-G formula based on SCr of 0.66 mg/dL). ?Liver Function Tests: ?Recent Labs  ?Lab 06/21/21 ?0358 06/22/21 ?0412 06/23/21 ?0093 06/24/21 ?8182 06/25/21 ?9937  ?AST 152* 321* 321* 150* 77*  ?ALT 171* 297* 414* 317* 233*  ?ALKPHOS 84 83 107 96 87  ?BILITOT 0.8 0.7 1.4* 1.1 0.9  ?PROT 5.8*  5.3* 5.9* 6.1* 6.0*  ?ALBUMIN 2.3* 2.0* 2.3* 2.2* 2.4*  ? ? ?No results for input(s): LIPASE, AMYLASE in the last 168 hours. ?No results for input(s): AMMONIA in the last 168 hours. ?Coagulation Profile: ?Recent Labs  ?Lab 06/23/21 ?1696  ?INR 1.2  ? ? ?Cardiac Enzymes: ?No results for input(s): CKTOTAL, CKMB, CKMBINDEX, TROPONINI in the last 168 hours. ?BNP (last 3 results) ?No results for input(s): PROBNP in the last 8760 hours. ?HbA1C: ?No results for input(s): HGBA1C in the last 72 hours. ?CBG: ?Recent Labs  ?Lab 06/25/21 ?0421 06/25/21 ?0803 06/25/21 ?1216 06/25/21 ?1348 06/25/21 ?1715  ?GLUCAP 89 74 69* 122* 83  ? ? ?Lipid Profile: ?No results for input(s): CHOL, HDL, LDLCALC, TRIG, CHOLHDL, LDLDIRECT in the last 72 hours. ?Thyroid Function Tests: ?No results for input(s): TSH, T4TOTAL, FREET4, T3FREE, THYROIDAB in the last 72 hours. ?Anemia Panel: ?No results for input(s): VITAMINB12, FOLATE, FERRITIN, TIBC, IRON, RETICCTPCT in the last 72 hours. ?Sepsis Labs: ?Recent Labs  ?Lab 06/20/21 ?0806 06/20/21 ?1034 06/21/21 ?0358  ?PROCALCITON 0.15  --   --   ?LATICACIDVEN 1.4 1.4 1.4  ? ? ? ?Recent Results (from the past 240 hour(s))  ?Expectorated Sputum Assessment w Gram Stain, Rflx to Resp Cult     Status: None  ? Collection Time: 06/18/21 12:02 AM  ? Specimen: Sputum  ?Result Value Ref Range Status  ? Specimen Description SPUTUM  Final  ? Special Requests NONE  Final  ? Sputum evaluation   Final  ?  THIS SPECIMEN IS ACCEPTABLE FOR SPUTUM CULTURE ?Performed at [facility], [place] [address], [place], [place] [address] ?  ? Report Status 06/19/2021 FINAL  Final  ?Culture, Respiratory w Gram Stain     Status: None  ? Collection Time: 06/18/21 12:02 AM  ? Specimen: SPU  ?Result Value Ref Range Status  ? Specimen Description   Final  ?  SPUTUM ?Performed at [facility], [place] [address], [place], [place] [address] ?  ? Special Requests   Final  ?  NONE Reflexed from  [id number] ?Performed at [facility], [place] [address] [place],  [address] ?  ? Gram Stain   Final  ?  ABUNDANT WBC PRESENT, PREDOMINANTLY PMN ?NO ORGANISMS SEEN ?Performed at [facility], [address] [place], [place] [address] ?  ? Culture RARE CANDIDA ALBICANS  Final  ? Report Status 06/21/2021 FINAL  Final  ?Resp Panel by RT-PCR (Flu A&B, Covid) Nasopharyngeal Swab     Status: None  ? Collection Time: 06/18/21  2:57 PM  ? Specimen: Nasopharyngeal Swab; Nasopharyngeal(NP) swabs in vial transport medium  ?Result Value Ref Range Status  ? SARS Coronavirus 2 by RT PCR NEGATIVE NEGATIVE Final  ?  Comment: (NOTE) ?SARS-CoV-2 target nucleic acids are NOT DETECTED. ? ?The SARS-CoV-2 RNA is generally detectable in upper respiratory ?specimens during the acute phase of infection. The lowest ?concentration of SARS-CoV-2 viral copies this assay can detect is ?138 copies/mL. A negative result does not preclude SARS-Cov-2 ?infection and should not be used as the sole basis for treatment or ?other patient management decisions. A negative result may occur with  ?improper specimen collection/handling, submission of specimen other ?than nasopharyngeal swab, presence of viral mutation(s) within the ?areas targeted by this assay, and inadequate number of viral ?copies(<138 copies/mL). A negative result must be combined with ?clinical observations, patient history, and epidemiological ?information. The expected result is Negative. ? ?Fact Sheet for Patients:  ?EntrepreneurPulse.com.au ? ?Fact Sheet for Healthcare Providers:  ?IncredibleEmployment.be ? ?This test is no t yet approved or cleared by the [place] FDA and  ?has been authorized for detection and/or diagnosis of SARS-CoV-2 by ?FDA under an Emergency Use Authorization (EUA). This EUA will remain  ?in effect (meaning this test can be used) for the duration of the ?COVID-19 declaration under Section  564(b)(1) of the Act, 21 ?U.S.C.section 360bbb-3(b)(1), unless the authorization is terminated  ?or revoked sooner.  ? ? ?  ? Influenza A by PCR NEGATIVE NEGATIVE Final  ? Influenza B by PCR NEGATIVE NEGATIVE Final  ?

## 2021-06-25 NOTE — Progress Notes (Signed)
Received call from central telemetry - patient with 7-beat-run of VT.  Checked on patient, patient sitting on chair, straightening out his nasal cannula.  Checked on leads. Patient without complaints.  Family at bedside.  ?

## 2021-06-25 NOTE — Progress Notes (Signed)
?Cardiology Progress Note  ?Patient ID: Roberto Ponce ?MRN: [id number] ?DOB: [birth year] ?Date of Encounter: 06/25/2021 ? ?Primary Cardiologist: Mertie Moores, MD ? ?Subjective  ? ?On 1 L nasal cannula.  Dyspnea improving daily. Sitting in bedside chair, wife [name] present and provides additional history. ? ?Inpatient Medications  ?Scheduled Meds: ? aspirin EC  81 mg Oral Daily  ? brimonidine  1 drop Left Eye BID  ? carvedilol  3.125 mg Oral BID WC  ? Chlorhexidine Gluconate Cloth  6 each Topical Daily  ? clopidogrel  75 mg Oral Q breakfast  ? dapagliflozin propanediol  10 mg Oral Daily  ? docusate sodium  100 mg Oral BID  ? dorzolamide-timolol  1 drop Both Eyes BID  ? enoxaparin (LOVENOX) injection  40 mg Subcutaneous Q24H  ? ezetimibe  10 mg Oral QHS  ? insulin aspart  0-15 Units Subcutaneous TID WC  ? latanoprost  1 drop Both Eyes QHS  ? losartan  12.5 mg Oral Daily  ? mouth rinse  15 mL Mouth Rinse BID  ? melatonin  3 mg Oral QHS  ? pantoprazole  40 mg Oral Daily  ? polyethylene glycol  17 g Oral Daily  ? sodium chloride flush  10-40 mL Intracatheter Q12H  ? sodium chloride flush  3 mL Intravenous Q12H  ? sodium chloride  1 g Oral BID WC  ? spironolactone  12.5 mg Oral Daily  ? ?Continuous Infusions: ? sodium chloride Stopped (06/20/21 0222)  ? sodium chloride Stopped (06/24/21 1314)  ? ?PRN Meds: ?sodium chloride, albuterol, nitroGLYCERIN, ondansetron **OR** ondansetron (ZOFRAN) IV, sodium chloride flush  ? ?Vital Signs  ? ?Vitals:  ? 06/25/21 0000 06/25/21 0235 06/25/21 0319 06/25/21 0500  ?BP: (!) 123/58 136/74    ?Pulse: (!) 58 64 65   ?Resp: _0 ?Temp: 97.8 ?F (36.6 ?C) 97.6 ?F (36.4 ?C)    ?TempSrc: Oral Oral    ?SpO2: 100% 96% 95%   ?Weight:    79.1 kg  ?Height:      ? ? ?Intake/Output Summary (Last 24 hours) at 06/25/2021 1022 ?Last data filed at 06/25/2021 0930 ?Gross per 24 hour  ?Intake 789.88 ml  ?Output 350 ml  ?Net 439.88 ml  ? ? ?  06/25/2021  ?  5:00 AM 06/24/2021  ?  5:00 AM 06/23/2021  ?  4:41  AM  ?Last 3 Weights  ?Weight (lbs) 174 lb 4.8 oz 162 lb 11.2 oz 164 lb 10.9 oz  ?Weight (kg) 79.062 kg 73.8 kg 74.7 kg  ?   ? ?Telemetry  ?Overnight telemetry SR  occasional PVCs, which I personally reviewed.  ? ?Physical Exam  ? ?Vitals:  ? 06/25/21 0000 06/25/21 0235 06/25/21 0319 06/25/21 0500  ?BP: (!) 123/58 136/74    ?Pulse: (!) 58 64 65   ?Resp: _1 ?Temp: 97.8 ?F (36.6 ?C) 97.6 ?F (36.4 ?C)    ?TempSrc: Oral Oral    ?SpO2: 100% 96% 95%   ?Weight:    79.1 kg  ?Height:      ?  ?Intake/Output Summary (Last 24 hours) at 06/25/2021 1022 ?Last data filed at 06/25/2021 0930 ?Gross per 24 hour  ?Intake 789.88 ml  ?Output 350 ml  ?Net 439.88 ml  ?  ? ?  06/25/2021  ?  5:00 AM 06/24/2021  ?  5:00 AM 06/23/2021  ?  4:41 AM  ?Last 3 Weights  ?Weight (lbs) 174 lb 4.8 oz 162 lb 11.2 oz 164 lb  10.9 oz  ?Weight (kg) 79.062 kg 73.8 kg 74.7 kg  ?  Body mass index is 25.74 kg/m?.  ?General: Well nourished, well developed, in no acute distress ?Head: Atraumatic, normal size  ?Eyes: PEERLA, EOMI  ?Neck: Supple, JVP normal. ?Endocrine: No thryomegaly ?Cardiac: Normal S1, S2; RRR; 2 out of 6 holosystolic murmur heard best at left upper sternal border ?Lungs: clear bilaterally ?Abd: Soft, nontender ?Ext: No edema ?Musculoskeletal: No deformities, BUE and BLE strength normal and equal ?Skin: Warm and dry, no rashes   ?Neuro: Alert and oriented to person, place, time, and situation, no focal deficits  ?Psych: Normal mood and affect  ? ?Labs  ?High Sensitivity Troponin:   ?Recent Labs  ?Lab 06/20/21 ?1723 06/21/21 ?0500  ?TROPONINIHS 61* 16  ?   ?Cardiac EnzymesNo results for input(s): TROPONINI in the last 168 hours. No results for input(s): TROPIPOC in the last 168 hours.  ?Chemistry ?Recent Labs  ?Lab 06/23/21 ?4665 06/24/21 ?9935 06/25/21 ?7017  ?NA 134* 138 135  ?K 3.6 3.3* 3.2*  ?CL 95* 95* 96*  ?CO2 29 33* 33*  ?GLUCOSE 154* 122* 86  ?BUN 21 29* 25*  ?CREATININE 0.55* 0.60* 0.66  ?CALCIUM 8.1* 8.1* 8.0*  ?PROT 5.9* 6.1* 6.0*   ?ALBUMIN 2.3* 2.2* 2.4*  ?AST 321* 150* 77*  ?ALT 414* 317* 233*  ?ALKPHOS 107 96 87  ?BILITOT 1.4* 1.1 0.9  ?GFRNONAA >60 >60 >60  ?ANIONGAP _0 ?  ?Hematology ?Recent Labs  ?Lab 06/23/21 ?7939 06/24/21 ?0300 06/25/21 ?9233  ?WBC 16.3* 14.6* 15.6*  ?RBC 3.55* 3.40* 3.53*  ?HGB 10.6* 10.3* 10.6*  ?HCT 31.6* 30.3* 31.6*  ?MCV 89.0 89.1 89.5  ?MCH 29.9 30.3 30.0  ?MCHC 33.5 34.0 33.5  ?RDW 14.4 14.9 15.1  ?PLT 303 321 336  ? ?BNP ?Recent Labs  ?Lab 06/20/21 ?1723 06/21/21 ?0358 06/23/21 ?0076  ?BNP 667.6* 514.9* 229.2*  ?  ?DDimer No results for input(s): DDIMER in the last 168 hours.  ? ?Radiology  ?No results found. ? ?Cardiac Studies  ?TTE 06/20/2021 ? 1. Left ventricular ejection fraction, by estimation, is 40 to 45%. The  ?left ventricle has mildly decreased function. The left ventricle  ?demonstrates regional wall motion abnormalities (see scoring  ?diagram/findings for description). There is mild left  ?ventricular hypertrophy. Left ventricular diastolic parameters are  ?consistent with Grade II diastolic dysfunction (pseudonormalization).  ?Elevated left atrial pressure.  ? 2. Right ventricular systolic function is mildly reduced. The right  ?ventricular size is mildly enlarged. There is moderately elevated  ?pulmonary artery systolic pressure. The estimated right ventricular  ?systolic pressure is 22.6 mmHg.  ? 3. Tricuspid valve regurgitation is mild to moderate.  ? 4. The aortic valve is tricuspid. Aortic valve regurgitation is not  ?visualized. Aortic valve sclerosis is present, with no evidence of aortic  ?valve stenosis.  ? 5. The inferior vena cava is dilated in size with <50% respiratory  ?variability, suggesting right atrial pressure of 15 mmHg.  ? 6. The mitral valve is abnormal. Moderate mitral valve regurgitation.  ?Eccentric posterior directed MR jet. Difficult to quantify severity given  ?eccentric jet, but normal LA/LV size argues against severe MR. Consider  ?TEE or cardiac MRI for further   ?evaluation  ? ?Patient Profile  ?Roberto Ponce is a 71 y.o. male with CAD status post CABG, recent PCI, severe mitral valve regurgitation, testicular cancer status post orchiectomy who was admitted on 06/18/2021 for acute hypoxic respiratory failure secondary to pneumonia.  Cardiology  consulted due to history of severe mitral valve regurgitation. ? ?Assessment & Plan  ? ?#Acute hypoxic respiratory failure ?#Pneumonia ?#Acute systolic heart failure, 40 to 45% ?#Moderate to severe mitral valve regurgitation ?-Admitted with hypoxic respiratory failure with concerns for pneumonia.  Briefly intubated and has been extubated. Underwent BAL.  This is negative for infection.  Viral panel is negative.  All of his autoimmune work-up is negative.  His HIV work-up is negative.  Strep pneumoniae and Legionella are negative.  ESR is slightly elevated. He was not that volume up but did have elevated BNP on admission.  Never had lower extremity edema.  Echo shows moderate mitral valve regurgitation does not appear to have progressed. Prior team reviewed his CT scan with structural heart team.  Picture does not fit with acute mitral valve regurgitation.Overall working diagnosis is likely possible pneumonia which is culture-negative and superimposed congestive heart failure. ?Cr: 0.66 ?UOP yesterday: 39 oz per wife (1.15 L) and 550 mL charted per nurse ?Weight: 81.4 kg > 80.7 > 76.9 kg>74.7 kg>73.8 kg>? ?Net negative for admission: -6 L ?Diuretic plan: lasix 40 mg IV today and transition to lasix 40 mg po BID ? ?-On Coreg 3.125 mg twice daily, losartan 12.5 mg daily, Aldactone 12.5 mg daily, Farxiga 10 mg daily.  Consider transition to [facility].   ?- Unlikely to need RHC with improving hypoxia and oxygen req. D/w pt and wife and they agree. ?-Cardiac enzyme was minimally elevated at 61 and has trended down.  No symptoms of angina. ? ?#Moderate to severe mitral valve regurgitation ?-Prior team reviewed chest CT with structural  heart team.  Feel this is more consistent with pneumonia than related to mitral valve disease.  Critical care medicine believes this could just be heart failure.  Really unclear.  Diurese as above. Can likel

## 2021-06-25 NOTE — Plan of Care (Signed)

## 2021-06-25 NOTE — Plan of Care (Signed)
?  Problem: Activity: ?Goal: Ability to tolerate increased activity will improve ?Outcome: Progressing ?  ?Problem: Respiratory: ?Goal: Ability to maintain adequate ventilation will improve ?Outcome: Progressing ?  ?Problem: Clinical Measurements: ?Goal: Ability to maintain clinical measurements within normal limits will improve ?Outcome: Progressing ?Goal: Respiratory complications will improve ?Outcome: Progressing ?Goal: Cardiovascular complication will be avoided ?Outcome: Progressing ?  ?Problem: Activity: ?Goal: Risk for activity intolerance will decrease ?Outcome: Progressing ?  ?

## 2021-06-25 NOTE — Progress Notes (Signed)
Connected pt to bedside continuous pulse ox machine ?

## 2021-06-26 ENCOUNTER — Inpatient Hospital Stay (HOSPITAL_COMMUNITY): Payer: PPO

## 2021-06-26 DIAGNOSIS — J9601 Acute respiratory failure with hypoxia: Secondary | ICD-10-CM | POA: Diagnosis not present

## 2021-06-26 LAB — COMPREHENSIVE METABOLIC PANEL
ALT: 186 U/L — ABNORMAL HIGH (ref 0–44)
AST: 51 U/L — ABNORMAL HIGH (ref 15–41)
Albumin: 2.4 g/dL — ABNORMAL LOW (ref 3.5–5.0)
Alkaline Phosphatase: 90 U/L (ref 38–126)
Anion gap: 7 (ref 5–15)
BUN: 24 mg/dL — ABNORMAL HIGH (ref 8–23)
CO2: 29 mmol/L (ref 22–32)
Calcium: 8.2 mg/dL — ABNORMAL LOW (ref 8.9–10.3)
Chloride: 97 mmol/L — ABNORMAL LOW (ref 98–111)
Creatinine, Ser: 0.61 mg/dL (ref 0.61–1.24)
GFR, Estimated: >60 mL/min (ref >60)
Glucose, Bld: 92 mg/dL (ref 70–99)
Potassium: 4.3 mmol/L (ref 3.5–5.1)
Sodium: 133 mmol/L — ABNORMAL LOW (ref 135–145)
Total Bilirubin: 1.1 mg/dL (ref 0.3–1.2)
Total Protein: 6.2 g/dL — ABNORMAL LOW (ref 6.5–8.1)

## 2021-06-26 LAB — GLUCOSE, CAPILLARY
Glucose-Capillary: 79 mg/dL (ref 70–99)
Glucose-Capillary: 84 mg/dL (ref 70–99)
Glucose-Capillary: 84 mg/dL (ref 70–99)
Glucose-Capillary: 193 mg/dL — ABNORMAL HIGH (ref 70–99)

## 2021-06-26 LAB — CBC
HCT: 32.5 % — ABNORMAL LOW (ref 39.0–52.0)
Hemoglobin: 11 g/dL — ABNORMAL LOW (ref 13.0–17.0)
MCH: 30.4 pg (ref 26.0–34.0)
MCHC: 33.8 g/dL (ref 30.0–36.0)
MCV: 89.8 fL (ref 80.0–100.0)
Platelets: 332 10*3/uL (ref 150–400)
RBC: 3.62 MIL/uL — ABNORMAL LOW (ref 4.22–5.81)
RDW: 14.9 % (ref 11.5–15.5)
WBC: 15.2 10*3/uL — ABNORMAL HIGH (ref 4.0–10.5)
nRBC: 0 % (ref 0.0–0.2)

## 2021-06-26 MED ORDER — FUROSEMIDE 10 MG/ML IJ SOLN
80.0000 mg | Freq: Once | INTRAMUSCULAR | Status: AC
  Administered 2021-06-26: 80 mg via INTRAVENOUS
  Filled 2021-06-26: qty 8

## 2021-06-26 NOTE — Progress Notes (Addendum)
? ?Progress Note ? ?Patient Name: Roberto Ponce ?Date of Encounter: 06/26/2021 ? ?[facility] Cardiologist: Werner Lean, MD  ? ?Subjective  ? ?Pt sitting up in chair. Remains on [place] O2, does not use O2 at home. Feels breathing is improving, but now with lower extremity edema that he didn't have when he was admitted. ? ?Inpatient Medications  ?  ?Scheduled Meds: ? aspirin EC  81 mg Oral Daily  ? brimonidine  1 drop Left Eye BID  ? carvedilol  3.125 mg Oral BID WC  ? Chlorhexidine Gluconate Cloth  6 each Topical Daily  ? clopidogrel  75 mg Oral Q breakfast  ? dapagliflozin propanediol  10 mg Oral Daily  ? docusate sodium  100 mg Oral BID  ? dorzolamide-timolol  1 drop Both Eyes BID  ? enoxaparin (LOVENOX) injection  40 mg Subcutaneous Q24H  ? ezetimibe  10 mg Oral QHS  ? furosemide  40 mg Oral BID  ? insulin aspart  0-15 Units Subcutaneous TID WC  ? latanoprost  1 drop Both Eyes QHS  ? losartan  12.5 mg Oral Daily  ? mouth rinse  15 mL Mouth Rinse BID  ? melatonin  3 mg Oral QHS  ? pantoprazole  40 mg Oral Daily  ? polyethylene glycol  17 g Oral Daily  ? potassium chloride  40 mEq Oral BID  ? sodium chloride flush  10-40 mL Intracatheter Q12H  ? sodium chloride flush  3 mL Intravenous Q12H  ? sodium chloride  1 g Oral BID WC  ? spironolactone  12.5 mg Oral Daily  ? ?Continuous Infusions: ? sodium chloride Stopped (06/20/21 0222)  ? sodium chloride Stopped (06/24/21 1314)  ? ?PRN Meds: ?sodium chloride, albuterol, nitroGLYCERIN, ondansetron **OR** ondansetron (ZOFRAN) IV, sodium chloride flush  ? ?Vital Signs  ?  ?Vitals:  ? 06/25/21 2002 06/25/21 2244 06/26/21 0500 06/26/21 0501  ?BP: (!) 93/42   107/62  ?Pulse: 75 67  74  ?Resp: _0 ?Temp: 97.7 ?F (36.5 ?C)   98.4 ?F (36.9 ?C)  ?TempSrc: Oral   Oral  ?SpO2: 96% 96%  97%  ?Weight:   74.8 kg   ?Height:      ? ? ?Intake/Output Summary (Last 24 hours) at 06/26/2021 0744 ?Last data filed at 06/26/2021 7681 ?Gross per 24 hour  ?Intake 720 ml  ?Output  2510 ml  ?Net -1790 ml  ? ? ?  06/26/2021  ?  5:00 AM 06/25/2021  ?  5:00 AM 06/24/2021  ?  5:00 AM  ?Last 3 Weights  ?Weight (lbs) 165 lb 174 lb 4.8 oz 162 lb 11.2 oz  ?Weight (kg) 74.844 kg 79.062 kg 73.8 kg  ?   ? ?Telemetry  ?  ?Sinus rhythm HR 70s, PVCs - Personally Reviewed ? ?ECG  ?  ?No new tracings - Personally Reviewed ? ?Physical Exam  ? ?GEN: No acute distress.   ?Neck: No JVD ?Cardiac: RRR, soft murmur  ?Respiratory: crackles in left base ?GI: Soft, nontender, non-distended  ?MS: + B LE 1+ pitting edema ?Neuro:  Nonfocal  ?Psych: Normal affect  ? ?Labs  ?  ?High Sensitivity Troponin:   ?Recent Labs  ?Lab 06/20/21 ?1723 06/21/21 ?0500  ?TROPONINIHS 61* 16  ?   ?Chemistry ?Recent Labs  ?Lab 06/20/21 ?2003 06/21/21 ?0358 06/21/21 ?1350 06/22/21 ?1572 06/23/21 ?6203 06/24/21 ?5597 06/25/21 ?4163 06/26/21 ?8453  ?NA  --  127*   < > 132*   < > 138 135  133*  ?K  --  4.1  --  3.5   < > 3.3* 3.2* 4.3  ?CL  --  97*  --  99   < > 95* 96* 97*  ?CO2  --  24  --  27   < > 33* 33* 29  ?GLUCOSE  --  193*  --  143*   < > 122* 86 92  ?BUN  --  10  --  13   < > 29* 25* 24*  ?CREATININE  --  0.53*  --  0.54*   < > 0.60* 0.66 0.61  ?CALCIUM  --  7.9*  --  7.6*   < > 8.1* 8.0* 8.2*  ?MG 1.7 2.3  --  2.3  --   --   --   --   ?PROT  --  5.8*  --  5.3*   < > 6.1* 6.0* 6.2*  ?ALBUMIN  --  2.3*  --  2.0*   < > 2.2* 2.4* 2.4*  ?AST  --  152*  --  321*   < > 150* 77* 51*  ?ALT  --  171*  --  297*   < > 317* 233* 186*  ?ALKPHOS  --  84  --  83   < > 96 87 90  ?BILITOT  --  0.8  --  0.7   < > 1.1 0.9 1.1  ?GFRNONAA  --  >60  --  >60   < > >60 >60 >60  ?ANIONGAP  --  6  --  6   < > _0 ? < > = values in this interval not displayed.  ?  ?Lipids  ?Recent Labs  ?Lab 06/21/21 ?3295  ?TRIG 86  ?  ?Hematology ?Recent Labs  ?Lab 06/24/21 ?1884 06/25/21 ?1660 06/26/21 ?6301  ?WBC 14.6* 15.6* 15.2*  ?RBC 3.40* 3.53* 3.62*  ?HGB 10.3* 10.6* 11.0*  ?HCT 30.3* 31.6* 32.5*  ?MCV 89.1 89.5 89.8  ?MCH 30.3 30.0 30.4  ?MCHC 34.0 33.5 33.8  ?RDW 14.9  15.1 14.9  ?PLT 321 336 332  ? ?Thyroid No results for input(s): TSH, FREET4 in the last 168 hours.  ?BNP ?Recent Labs  ?Lab 06/20/21 ?1723 06/21/21 ?0358 06/23/21 ?6010  ?BNP 667.6* 514.9* 229.2*  ?  ?DDimer No results for input(s): DDIMER in the last 168 hours.  ? ?Radiology  ?  ?No results found. ? ?Cardiac Studies  ? ?TTE 06/20/2021 ? 1. Left ventricular ejection fraction, by estimation, is 40 to 45%. The  ?left ventricle has mildly decreased function. The left ventricle  ?demonstrates regional wall motion abnormalities (see scoring  ?diagram/findings for description). There is mild left  ?ventricular hypertrophy. Left ventricular diastolic parameters are  ?consistent with Grade II diastolic dysfunction (pseudonormalization).  ?Elevated left atrial pressure.  ? 2. Right ventricular systolic function is mildly reduced. The right  ?ventricular size is mildly enlarged. There is moderately elevated  ?pulmonary artery systolic pressure. The estimated right ventricular  ?systolic pressure is 93.2 mmHg.  ? 3. Tricuspid valve regurgitation is mild to moderate.  ? 4. The aortic valve is tricuspid. Aortic valve regurgitation is not  ?visualized. Aortic valve sclerosis is present, with no evidence of aortic  ?valve stenosis.  ? 5. The inferior vena cava is dilated in size with <50% respiratory  ?variability, suggesting right atrial pressure of 15 mmHg.  ? 6. The mitral valve is abnormal. Moderate mitral valve regurgitation.  ?Eccentric posterior directed MR jet. Difficult to  quantify severity given  ?eccentric jet, but normal LA/LV size argues against severe MR. Consider  ?TEE or cardiac MRI for further  ?evaluation  ? ?Patient Profile  ?   ?71 y.o. male  with CAD status post CABG, recent PCI, severe mitral valve regurgitation, testicular cancer status post orchiectomy who was admitted on 06/18/2021 for acute hypoxic respiratory failure secondary to pneumonia.  Cardiology consulted due to history of severe mitral valve  regurgitation. ? ?Assessment & Plan  ?  ?Acute hypoxic respiratory failure ?PNA ?Acute systolic heart failure - new ?Moderate to severe MR ?Required brief intubation on arrival, has been extubated. Workup has been unrevealing: BAL negative, viral panel, strep pneumoniae, legionella, and HIV workup negative. Autoimmune workup negative. ESR was slightly elevated at 67, BNP 230. MR is stable and clinically not consistent with acute MR.  ?- suspicion for culture-negative PNA and superimposed congestive heart failure ?- persistent leukocytosis with WBC 15.2 today, peaked at 19.2 ?- LVEF 40-45% with grade II DD ?- planned to transition to PO lasix today, but crackles in left base,  pitting edema, and [place] O2 - question utility of OT 80 mg IV lasix, then resume 40 mg PO BID ?- GDMT includes spironolactone, farxiga, coreg, losartan ?- 2.5 L urine output yesterday, overall negative 8.7 L ?- BP will not support low dose entresto at this time, continue 12.5 mg losartan ?- continue to wean O2 ? ? ?CAD s/p CABG ?- LHC 01/2021 with PCI to mid CX ?- continue DAPT with ASA and plavix ?- HS troponin mildly elevated and flat, inconsistent with ACS ? ? ?Moderate to severe mitral regurgitation ?- has been following for possible mitraclip ?- may need to re-engage this discussion given new reduced EF ? ? ?Elevated LFTs ?- per primary ?- holding statin for  now ? ?   ? ?For questions or updates, please contact [facility] ?Please consult [email or web address] for contact info under  ? ?  ?   ?Signed, ?Ledora Bottcher, PA  ?06/26/2021, 7:44 AM   ? ? ?Personally seen and examined. Agree with APP above with the following comments: ?Patient with mod/severe MR with new SOB thought to be PNA and evidence of new HFrEF ?Patient notes that he is able to walk around the unit without issue but they have not weaned off his O2 yet.  LE edema is no, no SOB. ?Exam notable for holosystolic murmur, +1 bilateral LE edema ?Labs notable for tranaminitis of unclear  etiology ?Personally reviewed relevant tests; mod-severe MR eccentric with bileaflet prolapse ?Would recommend  ?- continue GDMT as above ?- increase lasix X1 today (IV) ?- BNP for tomorrow ?- needs ambulator

## 2021-06-26 NOTE — Progress Notes (Signed)
?PROGRESS NOTE ?Roberto Ponce  ZSW:[id number] DOB: 06/19/1950 DOA: 06/18/2021 ?PCP: Kristen Loader, FNP  ? ?Brief Narrative/Hospital Course: ?82 yom w/ CAD s/p STEMI in the 1980s ,recent course complicated by mitral valve prolapse with worsening regurgitation noted on TTE, and TEE,classified as severe. Workup has included a left/right heart cath, which identified a 95% LCx occlusion treated with PCI. CVTS workup is pending once he is no longer requiring anti-platelet agents. Recent course also complicated by diagnosis of testicular cancer in march of this year treated with right radical inguinal orchiectomy. No other therapy indicated at this time, presented to ED 5/1 with shortness of breath x1 week with mild cough , orthopnea. See at urgent care 4/27 and was diagnosed as viral pneumonia and was discharged, but presented to ED 5/1 due to worsening symptoms.  Initially admitted as CAP -Improved but got significantly worse 5/2-placed on BiPAP, PCCM consulted-moved to ICU-diagnosed as systolc CHF, intubated and finally extubated.  Seen by cardiology.Patient transferred to [facility] service 5/5.  ? ?  ?  ?Subjective: ?Seen and examined, on oxygen noticing some lower limb edema.  No nausea vomiting chest pain.  No fever. WBC count has been holding 14.6-15.6> 15.2 but no fever. ? ?Assessment and Plan: ?Principal Problem: ?  Acute respiratory failure with hypoxia ([place]) ?Active Problems: ?  CAD (coronary artery disease) of artery bypass graft ?  Hyponatremia ?  Acute systolic CHF (congestive heart failure) ([place]) ?  MR (mitral regurgitation) ?  Abnormal LFTs ?  Normocytic anemia ?  Leucocytosis ? ?Acute respiratory failure with hypoxia: ?Required mechanical ventilation in ICU.CT chest on 5/2 showed extensive consolidation bilaterally with pleural effusions likely due to pneumonia.  However subsequently felt to be related to CHF rather than pneumonia per PCCM and off antibiotics.Repeat chest x-ray on 5/5 shows  improvement.CXR:06/26/21 shows persistent but improved bilateral asymmetric airspace disease left greater than right.  Last procalcitonin was stable  0.15 on 5/2.  PCCM has signed off.  Patient did get antibiotics:ancomycin 5/1-5/3 ?Ceftriaxone 4/30-5/2 > Cefepime 5/2- 5/4 ( 5 days), azithro 4/30- 5/4 and subsequently stopped due to low suspicion for pneumonia. She has persistent leukocytosis but patient was on IV Solu-Cortef until 06/24/21 that could contribute. Cbc and procal in am. ?Recent Labs  ?Lab 06/20/21 ?0806 06/20/21 ?1034 06/20/21 ?2002 06/21/21 ?0358 06/22/21 ?3220 06/23/21 ?2542 06/24/21 ?7062 06/25/21 ?3762 06/26/21 ?8315  ?WBC  --   --    < > 16.4* 17.7* 16.3* 14.6* 15.6* 15.2*  ?LATICACIDVEN 1.4 1.4  --  1.4  --   --   --   --   --   ?PROCALCITON 0.15  --   --   --   --   --   --   --   --   ? < > = values in this interval not displayed.  ?  ?Acute systolic CHF, lvef 17-61%: Echo EF 40-45%, GIIDD: Continue GDMT with Aldactone Farxiga, losartan, Lasix x1 today, BNP in the morning.  Check ambulatory pulse ox.  Monitor intake output Daily weight, appreciate cardiology input.Net IO Since Admission: -8,763.45 mL [06/26/21 1323]  ?Filed Weights  ? 06/24/21 0500 06/25/21 0500 06/26/21 0500  ?Weight: 73.8 kg 79.1 kg 74.8 kg  ?  ?Moderately to severe MR: Has been following for possible MitraClip-cardiology following.  ? ?CAD:LHC 01/2021 with PCI to mid circumflex continue aspirin Plavix,holding statin. ? ?Hyponatremia:na at 133, monitor ? ?Abnormal LFTs: Likely from CHF. AST/ALT downtrending.  Hold off on statin ? ?Normocytic anemia:Hb stable  10-11 gm ?Recent Labs  ?Lab 06/22/21 ?0412 06/23/21 ?7106 06/24/21 ?2694 06/25/21 ?8546 06/26/21 ?2703  ?HGB 9.5* 10.6* 10.3* 10.6* 11.0*  ?HCT 27.8* 31.6* 30.3* 31.6* 32.5*  ?  ?DVT prophylaxis: enoxaparin (LOVENOX) injection 40 mg Start: 06/18/21 1800 ?Code Status:   Code Status: Full Code ?Family Communication: plan of care discussed with patient/wife at bedside. ?Patient  status is: inpatient because of ongoing management of respiratory failure ?Level of care: Telemetry  ? ?Dispo: The patient is from: home ?           Anticipated disposition: TBD ?Mobility Assessment (last 72 hours)   ? ? Mobility Assessment   ? ? [place] Name 06/26/21 1200 06/26/21 0701 06/25/21 2056 06/25/21 0757 06/25/21 0243  ? Does patient have an order for bedrest or is patient medically unstable -- No - Continue assessment No - Continue assessment No - Continue assessment No - Continue assessment  ? What is the highest level of mobility based on the progressive mobility assessment? Level 5 (Walks with assist in room/hall) - Balance while stepping forward/back and can walk in room with assist - Complete Level 5 (Walks with assist in room/hall) - Balance while stepping forward/back and can walk in room with assist - Complete Level 5 (Walks with assist in room/hall) - Balance while stepping forward/back and can walk in room with assist - Complete Level 5 (Walks with assist in room/hall) - Balance while stepping forward/back and can walk in room with assist - Complete Level 5 (Walks with assist in room/hall) - Balance while stepping forward/back and can walk in room with assist - Complete  ? ? [place] Name 06/24/21 1625 06/23/21 2000  ?  ?  ?  ? Does patient have an order for bedrest or is patient medically unstable -- No - Continue assessment     ? What is the highest level of mobility based on the progressive mobility assessment? Level 5 (Walks with assist in room/hall) - Balance while stepping forward/back and can walk in room with assist - Complete Level 5 (Walks with assist in room/hall) - Balance while stepping forward/back and can walk in room with assist - Complete     ? ?  ?  ? ?  ?Objective: ?Vitals last 24 hrs: ?Vitals:  ? 06/25/21 2244 06/26/21 0500 06/26/21 0501 06/26/21 0701  ?BP:   107/62   ?Pulse: 67  74   ?Resp: 18  18   ?Temp:   98.4 ?F (36.9 ?C)   ?TempSrc:   Oral   ?SpO2: 96%  97% 94%  ?Weight:  74.8  kg    ?Height:      ?Weight change: -4.218 kg ? ?Physical Examination: ?General exam: alert awake,older than stated age, weak appearing. ?HEENT:Oral mucosa moist, Ear/Nose WNL grossly, dentition normal. ?Respiratory system: Crackles present in the right lung base, no use of accessory muscle ?Cardiovascular system: S1 & S2 +, No JVD. ?Gastrointestinal system: Abdomen soft,NT,ND, BS+ ?Nervous System:Alert, awake, moving extremities and grossly nonfocal ?Extremities: LE edema ++,distal peripheral pulses palpable.  ?Skin: No rashes,no icterus. ?MSK: Normal muscle bulk,tone, power ? ?Medications reviewed:  ?Scheduled Meds: ? aspirin EC  81 mg Oral Daily  ? brimonidine  1 drop Left Eye BID  ? carvedilol  3.125 mg Oral BID WC  ? Chlorhexidine Gluconate Cloth  6 each Topical Daily  ? clopidogrel  75 mg Oral Q breakfast  ? dapagliflozin propanediol  10 mg Oral Daily  ? docusate sodium  100 mg Oral BID  ?  dorzolamide-timolol  1 drop Both Eyes BID  ? enoxaparin (LOVENOX) injection  40 mg Subcutaneous Q24H  ? ezetimibe  10 mg Oral QHS  ? furosemide  80 mg Intravenous Once  ? insulin aspart  0-15 Units Subcutaneous TID WC  ? latanoprost  1 drop Both Eyes QHS  ? losartan  12.5 mg Oral Daily  ? mouth rinse  15 mL Mouth Rinse BID  ? melatonin  3 mg Oral QHS  ? pantoprazole  40 mg Oral Daily  ? polyethylene glycol  17 g Oral Daily  ? potassium chloride  40 mEq Oral BID  ? sodium chloride flush  10-40 mL Intracatheter Q12H  ? sodium chloride flush  3 mL Intravenous Q12H  ? sodium chloride  1 g Oral BID WC  ? spironolactone  12.5 mg Oral Daily  ? ?Continuous Infusions: ? sodium chloride Stopped (06/20/21 0222)  ? sodium chloride Stopped (06/24/21 1314)  ? ? ?  ?Diet Order   ? ?       ?  Diet Heart Room service appropriate? Yes; Fluid consistency: Thin  Diet effective now       ?  ? ?  ?  ? ?  ? ?Intake/Output Summary (Last 24 hours) at 06/26/2021 1312 ?Last data filed at 06/26/2021 8841 ?Gross per 24 hour  ?Intake 240 ml  ?Output 1100 ml   ?Net -860 ml  ? ?Net IO Since Admission: -8,763.45 mL [06/26/21 1312]  ?Wt Readings from Last 3 Encounters:  ?06/26/21 74.8 kg  ?05/18/21 74.5 kg  ?04/19/21 74.8 kg  ?  ? ?Unresulted Labs (From admission, onward)

## 2021-06-26 NOTE — Progress Notes (Signed)
Physical Therapy Treatment ?Patient Details ?Name: Roberto Ponce ?MRN: [id number] ?DOB: [birth year] ?Today's Date: 06/26/2021 ? ? ?History of Present Illness Roberto Ponce is a 71 y.o. male with medical history significant of CAD, MI s/p CABG, HLD, recent testicular seminoma s/p orchectomy presented to ED with a complaint of cough/shortness of breath.  Admitted under hospitalist service with community-acquired pneumonia Intubated 5/2-5/05-2021. ? ?  ?PT Comments  ? ? Pt received in recliner on 1L O2 with resting SpO2 at 94%. Pt completed 2 separate trials of 111f of gait training with RW x 1 minute each (rest in between) with 1L O2 and on RA. Each time pt was at 94% at prior to walk, dropping to 83% after 786f no c/o SOB. Pt returned to room unable to recover on 1L, requiring 2L O2 and vc's for PLB technique to attain 94%. Will continue to monitor. Pt will also benefit from a RW upon d/c home once medically stable. ?  ?Recommendations for follow up therapy are one component of a multi-disciplinary discharge planning process, led by the attending physician.  Recommendations may be updated based on patient status, additional functional criteria and insurance authorization. ? ?Follow Up Recommendations ? Home health PT ?  ?  ?Assistance Recommended at Discharge Set up Supervision/Assistance  ?Patient can return home with the following A little help with walking and/or transfers;A little help with bathing/dressing/bathroom;Help with stairs or ramp for entrance;Assistance with cooking/housework;Assist for transportation ?  ?Equipment Recommendations ?    ?  ?Recommendations for Other Services   ? ? ?  ?Precautions / Restrictions Precautions ?Precautions: None ?Restrictions ?Weight Bearing Restrictions: No  ?  ? ?Mobility ? Bed Mobility ?  ?  ?  ?  ?  ?  ?  ?General bed mobility comments: in recliner ?  ? ?Transfers ?Overall transfer level: Needs assistance ?Equipment used: Rolling walker (2 wheels) ?Transfers:  Sit to/from Stand ?Sit to Stand: Supervision ?  ?  ?  ?  ?  ?  ?  ? ?Ambulation/Gait ?Ambulation/Gait assistance: Min guard ?Gait Distance (Feet): 140 Feet ?Assistive device: Rolling walker (2 wheels) ?Gait Pattern/deviations: Step-through pattern ?  ?  ?  ?General Gait Details: No rest breaks, steady pace ? ? ?Stairs ?  ?  ?  ?  ?  ? ? ?Wheelchair Mobility ?  ? ?Modified Rankin (Stroke Patients Only) ?  ? ? ?  ?Balance   ?  ?  ?  ?  ?  ?  ?  ?  ?  ?  ?  ?  ?  ?  ?  ?  ?  ?  ?  ? ?  ?Cognition Arousal/Alertness: Awake/alert ?Behavior During Therapy: [facility] LLCor tasks assessed/performed ?Overall Cognitive Status: Within Functional Limits for tasks assessed ?  ?  ?  ?  ?  ?  ?  ?  ?  ?  ?  ?  ?  ?  ?  ?  ?  ?  ?  ? ?  ?Exercises   ? ?  ?General Comments General comments (skin integrity, edema, etc.): Pt and family educated on PLB technique and energy conservation ?  ?  ? ?Pertinent Vitals/Pain Pain Assessment ?Pain Assessment: No/denies pain  ? ? ?Home Living   ?  ?  ?  ?  ?  ?  ?  ?  ?  ?   ?  ?Prior Function    ?  ?  ?   ? ?PT Goals (current goals  can now be found in the care plan section) Acute Rehab PT Goals ?Patient Stated Goal: to  get back to exercising ? ?  ?Frequency ? ? ? Min 3X/week ? ? ? ?  ?PT Plan Current plan remains appropriate  ? ? ?Co-evaluation   ?  ?  ?  ?  ? ?  ?AM-PAC PT "6 Clicks" Mobility   ?Outcome Measure ? Help needed turning from your back to your side while in a flat bed without using bedrails?: A Little ?Help needed moving from lying on your back to sitting on the side of a flat bed without using bedrails?: A Little ?Help needed moving to and from a bed to a chair (including a wheelchair)?: A Little ?Help needed standing up from a chair using your arms (e.g., wheelchair or bedside chair)?: A Little ?Help needed to walk in hospital room?: A Little ?Help needed climbing 3-5 steps with a railing? : A Lot ?6 Click Score: 17 ? ?  ?End of Session Equipment Utilized During Treatment: Gait  belt;Oxygen ?Activity Tolerance: Patient tolerated treatment well ?Patient left: in chair;with call bell/phone within reach;with family/visitor present ?Nurse Communication: Mobility status ?PT Visit Diagnosis: Difficulty in walking, not elsewhere classified (R26.2) ?  ? ? ?Time: 1200-1230 ?PT Time Calculation (min) (ACUTE ONLY): 30 min ? ?Charges:  $Gait Training: 8-22 mins ?$Therapeutic Activity: 8-22 mins          ?Mikel Cella, PTA ? ? ? ?Josie Dixon ?06/26/2021, 12:46 PM ? ?

## 2021-06-27 DIAGNOSIS — J9601 Acute respiratory failure with hypoxia: Secondary | ICD-10-CM | POA: Diagnosis not present

## 2021-06-27 LAB — COMPREHENSIVE METABOLIC PANEL
ALT: 128 U/L — ABNORMAL HIGH (ref 0–44)
AST: 37 U/L (ref 15–41)
Albumin: 2.5 g/dL — ABNORMAL LOW (ref 3.5–5.0)
Alkaline Phosphatase: 83 U/L (ref 38–126)
Anion gap: 7 (ref 5–15)
BUN: 23 mg/dL (ref 8–23)
CO2: 28 mmol/L (ref 22–32)
Calcium: 8.2 mg/dL — ABNORMAL LOW (ref 8.9–10.3)
Chloride: 95 mmol/L — ABNORMAL LOW (ref 98–111)
Creatinine, Ser: 0.7 mg/dL (ref 0.61–1.24)
GFR, Estimated: >60 mL/min (ref >60)
Glucose, Bld: 122 mg/dL — ABNORMAL HIGH (ref 70–99)
Potassium: 4.3 mmol/L (ref 3.5–5.1)
Sodium: 130 mmol/L — ABNORMAL LOW (ref 135–145)
Total Bilirubin: 1.1 mg/dL (ref 0.3–1.2)
Total Protein: 6.8 g/dL (ref 6.5–8.1)

## 2021-06-27 LAB — URINE DRUGS OF ABUSE SCREEN W ALC, ROUTINE (REF LAB)
Amphetamines, Urine: NEGATIVE ng/mL
Barbiturate, Ur: NEGATIVE ng/mL
Cannabinoid Quant, Ur: NEGATIVE ng/mL
Cocaine (Metab.): NEGATIVE ng/mL
Ethanol U, Quan: NEGATIVE %
Methadone Screen, Urine: NEGATIVE ng/mL
Opiate Quant, Ur: NEGATIVE ng/mL
Phencyclidine, Ur: NEGATIVE ng/mL
Propoxyphene, Urine: NEGATIVE ng/mL

## 2021-06-27 LAB — DRUG PROFILE 799031: BENZODIAZEPINES: NEGATIVE

## 2021-06-27 LAB — CBC
HCT: 32.1 % — ABNORMAL LOW (ref 39.0–52.0)
Hemoglobin: 11 g/dL — ABNORMAL LOW (ref 13.0–17.0)
MCH: 31.1 pg (ref 26.0–34.0)
MCHC: 34.3 g/dL (ref 30.0–36.0)
MCV: 90.7 fL (ref 80.0–100.0)
Platelets: 316 10*3/uL (ref 150–400)
RBC: 3.54 MIL/uL — ABNORMAL LOW (ref 4.22–5.81)
RDW: 14.7 % (ref 11.5–15.5)
WBC: 15.3 10*3/uL — ABNORMAL HIGH (ref 4.0–10.5)
nRBC: 0 % (ref 0.0–0.2)

## 2021-06-27 LAB — GLUCOSE, CAPILLARY
Glucose-Capillary: 100 mg/dL — ABNORMAL HIGH (ref 70–99)
Glucose-Capillary: 97 mg/dL (ref 70–99)
Glucose-Capillary: 95 mg/dL (ref 70–99)
Glucose-Capillary: 137 mg/dL — ABNORMAL HIGH (ref 70–99)

## 2021-06-27 LAB — ACID FAST SMEAR (AFB, MYCOBACTERIA): Acid Fast Smear: NEGATIVE

## 2021-06-27 LAB — PROCALCITONIN: Procalcitonin: <0.1 ng/mL

## 2021-06-27 LAB — BRAIN NATRIURETIC PEPTIDE: B Natriuretic Peptide: 130.6 pg/mL — ABNORMAL HIGH (ref 0.0–100.0)

## 2021-06-27 MED ORDER — FUROSEMIDE 10 MG/ML IJ SOLN
80.0000 mg | Freq: Once | INTRAMUSCULAR | Status: AC
  Administered 2021-06-27: 80 mg via INTRAVENOUS
  Filled 2021-06-27: qty 8

## 2021-06-27 NOTE — Progress Notes (Signed)
Pt has declined use of BIPAP QHS.  Machine remains at bedside.  ?

## 2021-06-27 NOTE — Progress Notes (Signed)
? ?Progress Note ? ?Patient Name: Roberto Ponce ?Date of Encounter: 06/27/2021 ? ?[facility] Cardiologist: Werner Lean, MD  ? ?Subjective  ? ?Patient is off O2.  BNP has improved.  Feels better.  Has been able to ambulate without incidence ? ?Inpatient Medications  ?  ?Scheduled Meds: ? aspirin EC  81 mg Oral Daily  ? brimonidine  1 drop Left Eye BID  ? carvedilol  3.125 mg Oral BID WC  ? Chlorhexidine Gluconate Cloth  6 each Topical Daily  ? clopidogrel  75 mg Oral Q breakfast  ? dapagliflozin propanediol  10 mg Oral Daily  ? docusate sodium  100 mg Oral BID  ? dorzolamide-timolol  1 drop Both Eyes BID  ? enoxaparin (LOVENOX) injection  40 mg Subcutaneous Q24H  ? ezetimibe  10 mg Oral QHS  ? insulin aspart  0-15 Units Subcutaneous TID WC  ? latanoprost  1 drop Both Eyes QHS  ? losartan  12.5 mg Oral Daily  ? mouth rinse  15 mL Mouth Rinse BID  ? melatonin  3 mg Oral QHS  ? pantoprazole  40 mg Oral Daily  ? polyethylene glycol  17 g Oral Daily  ? sodium chloride flush  10-40 mL Intracatheter Q12H  ? sodium chloride flush  3 mL Intravenous Q12H  ? sodium chloride  1 g Oral BID WC  ? spironolactone  12.5 mg Oral Daily  ? ?Continuous Infusions: ? sodium chloride Stopped (06/20/21 0222)  ? sodium chloride Stopped (06/24/21 1314)  ? ?PRN Meds: ?sodium chloride, albuterol, nitroGLYCERIN, ondansetron **OR** ondansetron (ZOFRAN) IV, sodium chloride flush  ? ?Vital Signs  ?  ?Vitals:  ? 06/27/21 0844 06/27/21 0932 06/27/21 1033 06/27/21 1043  ?BP: (!) 107/49  (!) 95/55   ?Pulse: 83 82 72   ?Resp:  18    ?Temp:      ?TempSrc:      ?SpO2: 97% 97% 93% 92%  ?Weight:      ?Height:      ? ? ?Intake/Output Summary (Last 24 hours) at 06/27/2021 1222 ?Last data filed at 06/27/2021 0911 ?Gross per 24 hour  ?Intake 590 ml  ?Output 1195 ml  ?Net -605 ml  ? ? ?  06/27/2021  ?  4:15 AM 06/26/2021  ?  5:00 AM 06/25/2021  ?  5:00 AM  ?Last 3 Weights  ?Weight (lbs) 163 lb 165 lb 174 lb 4.8 oz  ?Weight (kg) 73.936 kg 74.844 kg  79.062 kg  ?   ? ?Telemetry  ?  ?SR, PVCs - Personally Reviewed ? ?Physical Exam  ? ?GEN: No acute distress.   ?Neck: No JVD ?Cardiac: RRR,  systolic ?Respiratory: rhonci in left base ?GI: Soft, nontender, non-distended  ?MS: +1 pitting R leg pitting edema ?Neuro:  Nonfocal  ?Psych: Normal affect  ? ?Labs  ?  ?High Sensitivity Troponin:   ?Recent Labs  ?Lab 06/20/21 ?1723 06/21/21 ?0500  ?TROPONINIHS 61* 16  ?   ?Chemistry ?Recent Labs  ?Lab 06/20/21 ?2003 06/21/21 ?0358 06/21/21 ?1350 06/22/21 ?9629 06/23/21 ?5284 06/25/21 ?1324 06/26/21 ?4010 06/27/21 ?0436  ?NA  --  127*   < > 132*   < > 135 133* 130*  ?K  --  4.1  --  3.5   < > 3.2* 4.3 4.3  ?CL  --  97*  --  99   < > 96* 97* 95*  ?CO2  --  24  --  27   < > 33* 29 28  ?GLUCOSE  --  193*  --  143*   < > 86 92 122*  ?BUN  --  10  --  13   < > 25* 24* 23  ?CREATININE  --  0.53*  --  0.54*   < > 0.66 0.61 0.70  ?CALCIUM  --  7.9*  --  7.6*   < > 8.0* 8.2* 8.2*  ?MG 1.7 2.3  --  2.3  --   --   --   --   ?PROT  --  5.8*  --  5.3*   < > 6.0* 6.2* 6.8  ?ALBUMIN  --  2.3*  --  2.0*   < > 2.4* 2.4* 2.5*  ?AST  --  152*  --  321*   < > 77* 51* 37  ?ALT  --  171*  --  297*   < > 233* 186* 128*  ?ALKPHOS  --  84  --  83   < > 87 90 83  ?BILITOT  --  0.8  --  0.7   < > 0.9 1.1 1.1  ?GFRNONAA  --  >60  --  >60   < > >60 >60 >60  ?ANIONGAP  --  6  --  6   < > _0 ? < > = values in this interval not displayed.  ?  ?Lipids  ?Recent Labs  ?Lab 06/21/21 ?5465  ?TRIG 86  ?  ?Hematology ?Recent Labs  ?Lab 06/25/21 ?6812 06/26/21 ?7517 06/27/21 ?0436  ?WBC 15.6* 15.2* 15.3*  ?RBC 3.53* 3.62* 3.54*  ?HGB 10.6* 11.0* 11.0*  ?HCT 31.6* 32.5* 32.1*  ?MCV 89.5 89.8 90.7  ?MCH 30.0 30.4 31.1  ?MCHC 33.5 33.8 34.3  ?RDW 15.1 14.9 14.7  ?PLT 336 332 316  ? ?Thyroid No results for input(s): TSH, FREET4 in the last 168 hours.  ?BNP ?Recent Labs  ?Lab 06/21/21 ?0358 06/23/21 ?0017 06/27/21 ?0436  ?BNP 514.9* 229.2* 130.6*  ?  ?DDimer No results for input(s): DDIMER in the last 168 hours.   ? ?Radiology  ?  ?DG Chest 2 View ? ?Result Date: 06/26/2021 ?CLINICAL DATA:  See HF with cough. EXAM: CHEST - 2 VIEW COMPARISON:  06/23/2021 FINDINGS: Mildly improved bilateral asymmetric airspace disease, more diffuse on the left than the right. No substantial pleural effusion. The cardiopericardial silhouette is within normal limits for size. Right PICC line tip overlies the mid SVC level. Telemetry leads overlie the chest. IMPRESSION: Persistent but improved bilateral asymmetric airspace disease, left greater than right. Electronically Signed   By: Misty Stanley M.D.   On: 06/26/2021 09:58   ? ?Cardiac Studies  ? ?TTE 06/20/2021 ? 1. Left ventricular ejection fraction, by estimation, is 40 to 45%. The  ?left ventricle has mildly decreased function. The left ventricle  ?demonstrates regional wall motion abnormalities (see scoring  ?diagram/findings for description). There is mild left  ?ventricular hypertrophy. Left ventricular diastolic parameters are  ?consistent with Grade II diastolic dysfunction (pseudonormalization).  ?Elevated left atrial pressure.  ? 2. Right ventricular systolic function is mildly reduced. The right  ?ventricular size is mildly enlarged. There is moderately elevated  ?pulmonary artery systolic pressure. The estimated right ventricular  ?systolic pressure is 49.4 mmHg.  ? 3. Tricuspid valve regurgitation is mild to moderate.  ? 4. The aortic valve is tricuspid. Aortic valve regurgitation is not  ?visualized. Aortic valve sclerosis is present, with no evidence of aortic  ?valve stenosis.  ? 5. The inferior vena cava is dilated in  size with <50% respiratory  ?variability, suggesting right atrial pressure of 15 mmHg.  ? 6. The mitral valve is abnormal. Moderate mitral valve regurgitation.  ?Eccentric posterior directed MR jet. Difficult to quantify severity given  ?eccentric jet, but normal LA/LV size argues against severe MR. Consider  ?TEE or cardiac MRI for further  ?evaluation  ? ?Patient  Profile  ?   ?71 y.o. male  with CAD status post CABG, recent PCI, severe mitral valve regurgitation, testicular cancer status post orchiectomy who was admitted on 06/18/2021 for acute hypoxic respiratory failure secondary to pneumonia.  Cardiology consulted due to history of severe mitral valve regurgitation. ? ?Assessment & Plan  ?  ?Acute hypoxic respiratory failure ?PNA ?Acute systolic heart failure - new ?Moderate to severe MR ?Required brief intubation on arrival, has been extubated. Workup has been unrevealing: BAL negative, viral panel, strep pneumoniae, legionella, and HIV workup negative. Autoimmune workup negative. ESR was slightly elevated at 67, BNP 230. MR is stable and clinically not consistent with acute MR.  ?- CXR suggest PNA has improved by imaging has not completely resolved ?- persistent leukocytosis with WBC 15.3 ?- LVEF 40-45% with grade II DD ?- lasix 40 mg PO BID ?- GDMT includes spironolactone, farxiga, coreg ?- 2.5 L urine output yesterday, overall negative 8.7 L ?- we have discussed indications for increased PO lasix, and reasons for repeat evaluation (DOD visit for worsening weight gain SOB, or le swelling; PCP for worsening cough, fever or B symtptoms) ?-patient is presently  ? ?CAD s/p CABG ?- LHC 01/2021 with PCI to mid CX ?- continue DAPT with ASA and plavix ?- HS troponin mildly elevated and flat, inconsistent with ACS ? ? ?Moderate to severe mitral regurgitation ?- outpatient repeat echo may prompt RHC and potentially TEE ? ? ?Elevated LFTs ?- per primary ?- holding statin for  now ? ?If patient is able to stay of O2, in conjunction with out [facility] colleagues, will plan for discharge; will not take off our list in the event that does not occur ? ?For questions or updates, please contact [place] ?Please consult [email or web address] for contact info under  ? ?  ?   ?Signed, ?Werner Lean, MD  ?06/27/2021, 12:22 PM   ? ? ? ?

## 2021-06-27 NOTE — Progress Notes (Signed)
?PROGRESS NOTE ?Roberto Ponce  KDT:[id number] DOB: 01/25/1951 DOA: 06/18/2021 ?PCP: Kristen Loader, FNP  ? ?Brief Narrative/Hospital Course: ?70 yom w/ CAD s/p STEMI in the 1980s ,recent course complicated by mitral valve prolapse with worsening regurgitation noted on TTE, and TEE,classified as severe. Workup has included a left/right heart cath, which identified a 95% LCx occlusion treated with PCI. CVTS workup is pending once he is no longer requiring anti-platelet agents. Recent course also complicated by diagnosis of testicular cancer in march of this year treated with right radical inguinal orchiectomy. No other therapy indicated at this time, presented to ED 5/1 with shortness of breath x1 week with mild cough , orthopnea. See at urgent care 4/27 and was diagnosed as viral pneumonia and was discharged, but presented to ED 5/1 due to worsening symptoms.  Initially admitted as CAP -Improved but got significantly worse 5/2-placed on BiPAP, PCCM consulted-moved to ICU-diagnosed as systolc CHF, intubated and finally extubated.  Seen by cardiology.Patient transferred to [facility] service 5/5.  ? ?Subjective: ?Seen and examined this morning.  On 1 L nasal cannula, patient reports he voided well with Lasix. ?Wife is at the bedside. ?WBC count has been holding 14.6-15.6> 15.2> 15.3-but no fever, procalcitonin is reassuring and negative less than 0.1. ? ?Assessment and Plan: ?Principal Problem: ?  Acute respiratory failure with hypoxia (HCC) ?Active Problems: ?  CAD (coronary artery disease) of artery bypass graft ?  Hyponatremia ?  Acute systolic CHF (congestive heart failure) ([place]) ?  MR (mitral regurgitation) ?  Abnormal LFTs ?  Normocytic anemia ?  Leucocytosis ? ?Acute respiratory failure with hypoxia: ?Required mechanical ventilation in ICU.CT chest on 5/2 showed extensive consolidation bilaterally with pleural effusions likely due to pneumonia.  However subsequently felt to be related to CHF rather than pneumonia  per PCCM and off antibiotics.Repeat chest x-ray on 5/5 shows improvement.CXR:06/26/21 shows persistent but improved bilateral asymmetric airspace disease left greater than right. PCCM has signed off.  Patient did get antibiotics:Vancomycin 5/1-5/3, Ceftriaxone 4/30-5/2 > Cefepime 5/2- 5/4 ( 5 days), azithro 4/30- 5/4 and subsequently stopped.Procalcitonin this morning reassuring less than 0.1. Has persistent leukocytosis but patient was on IV Solu-Cortef until 06/24/21 that could contribute.  Monitor CBC in a.m. ?Came off o2 this afternoon but on ambulation spo2 down to 87%-quickly recovered, walked 159f- -discussed with cardiology plan to repeat Lasix IV x1 then repeat BMP in a.m. and CBC in a.m. hopefully does not need to go home with oxygen. ?Recent Labs  ?Lab 06/21/21 ?0358 06/22/21 ?0412 06/23/21 ?0998305/06/23 ?0382505/07/23 ?0053905/08/23 ?0767305/09/23 ?0436  ?WBC 16.4*   < > 16.3* 14.6* 15.6* 15.2* 15.3*  ?LATICACIDVEN 1.4  --   --   --   --   --   --   ?PROCALCITON  --   --   --   --   --   --  <0.10  ? < > = values in this interval not displayed.  ? ?Acute systolic CHF, lvef 441-93% Echo EF 40-45%, GIIDD: Continue GDMT with Aldactone Farxiga, losartan, Lasix x1 iv 5/8- with good UOP 1195- BNP  130.  Oxygen now weaned down to 1 L.  Responding well to diuretics.Monitor intake output Daily weight, appreciate cardiology input.Net IO Since Admission: -9,368.45 mL [06/27/21 1103]  ?Filed Weights  ? 06/25/21 0500 06/26/21 0500 06/27/21 0415  ?Weight: 79.1 kg 74.8 kg 73.9 kg  ?  ?Moderately to severe MR:Has been following for possible MitraClip-cardiology following-await further input ? ?CAD:LHC 01/2021 with  PCI to mid circumflex continue aspirin Plavix,holding statin. ? ?Hyponatremia:na at 133> 130,monitor ? ?Abnormal LFTs: Likely from CHF. AST/ALT significantly better.Hold off on statin ? ?Normocytic anemia:Hb stable 10-11 gm ?Recent Labs  ?Lab 06/23/21 ?1517 06/24/21 ?6160 06/25/21 ?7371 06/26/21 ?0626  06/27/21 ?0436  ?HGB 10.6* 10.3* 10.6* 11.0* 11.0*  ?HCT 31.6* 30.3* 31.6* 32.5* 32.1*  ? ?DVT prophylaxis: enoxaparin (LOVENOX) injection 40 mg Start: 06/18/21 1800 ?Code Status:   Code Status: Full Code ?Family Communication: plan of care discussed with patient/wife at bedside. ?Patient status is: inpatient because of ongoing management of respiratory failure ?Level of care: Telemetry  ? ?Dispo: The patient is from: home ?           Anticipated disposition: TBD, pending cardiac clearance.  Likely tomorrow afebrile, oxygen ?Mobility Assessment (last 72 hours)   ? ? Mobility Assessment   ? ? [place] Name 06/26/21 1200 06/26/21 0701 06/25/21 2056 06/25/21 0757 06/25/21 0243  ? Does patient have an order for bedrest or is patient medically unstable -- No - Continue assessment No - Continue assessment No - Continue assessment No - Continue assessment  ? What is the highest level of mobility based on the progressive mobility assessment? Level 5 (Walks with assist in room/hall) - Balance while stepping forward/back and can walk in room with assist - Complete Level 5 (Walks with assist in room/hall) - Balance while stepping forward/back and can walk in room with assist - Complete Level 5 (Walks with assist in room/hall) - Balance while stepping forward/back and can walk in room with assist - Complete Level 5 (Walks with assist in room/hall) - Balance while stepping forward/back and can walk in room with assist - Complete Level 5 (Walks with assist in room/hall) - Balance while stepping forward/back and can walk in room with assist - Complete  ? ? [place] Name 06/24/21 1625  ?  ?  ?  ?  ? What is the highest level of mobility based on the progressive mobility assessment? Level 5 (Walks with assist in room/hall) - Balance while stepping forward/back and can walk in room with assist - Complete      ? ?  ?  ? ?  ?Objective: ?Vitals last 24 hrs: ?Vitals:  ? 06/27/21 0844 06/27/21 0932 06/27/21 1033 06/27/21 1043  ?BP: (!) 107/49  (!)  95/55   ?Pulse: 83 82 72   ?Resp:  18    ?Temp:      ?TempSrc:      ?SpO2: 97% 97% 93% 92%  ?Weight:      ?Height:      ?Weight change: -0.907 kg ? ?Physical Examination: ?General exam: AA oriented, on 1 L nasal cannula,older than stated age, weak appearing. ?HEENT:Oral mucosa moist, Ear/Nose WNL grossly, dentition normal. ?Respiratory system: bilaterally basal crackles,no use of accessory muscle. ?Cardiovascular system: S1 & S2 +, No JVD. ?Gastrointestinal system: Abdomen soft,NT,ND, BS+. ?Nervous System:Alert, awake, moving extremities and grossly nonfocal. ?Extremities: edema + in b/l ankles,distal peripheral pulses palpable.  ?Skin: No rashes,no icterus. ?MSK: Normal muscle bulk,tone, power. ? ?Medications reviewed:  ?Scheduled Meds: ? aspirin EC  81 mg Oral Daily  ? brimonidine  1 drop Left Eye BID  ? carvedilol  3.125 mg Oral BID WC  ? Chlorhexidine Gluconate Cloth  6 each Topical Daily  ? clopidogrel  75 mg Oral Q breakfast  ? dapagliflozin propanediol  10 mg Oral Daily  ? docusate sodium  100 mg Oral BID  ? dorzolamide-timolol  1 drop Both Eyes  BID  ? enoxaparin (LOVENOX) injection  40 mg Subcutaneous Q24H  ? ezetimibe  10 mg Oral QHS  ? insulin aspart  0-15 Units Subcutaneous TID WC  ? latanoprost  1 drop Both Eyes QHS  ? losartan  12.5 mg Oral Daily  ? mouth rinse  15 mL Mouth Rinse BID  ? melatonin  3 mg Oral QHS  ? pantoprazole  40 mg Oral Daily  ? polyethylene glycol  17 g Oral Daily  ? sodium chloride flush  10-40 mL Intracatheter Q12H  ? sodium chloride flush  3 mL Intravenous Q12H  ? sodium chloride  1 g Oral BID WC  ? spironolactone  12.5 mg Oral Daily  ?Continuous Infusions: ? sodium chloride Stopped (06/20/21 0222)  ? sodium chloride Stopped (06/24/21 1314)  ?  ?Diet Order   ? ?       ?  Diet Heart Room service appropriate? Yes; Fluid consistency: Thin  Diet effective now       ?  ? ?  ?  ? ?  ? ?Intake/Output Summary (Last 24 hours) at 06/27/2021 1103 ?Last data filed at 06/27/2021 0911 ?Gross per 24  hour  ?Intake 590 ml  ?Output 1195 ml  ?Net -605 ml  ? ?Net IO Since Admission: -9,368.45 mL [06/27/21 1103]  ?Wt Readings from Last 3 Encounters:  ?06/27/21 73.9 kg  ?05/18/21 74.5 kg  ?04/19/21 74.8 kg  ? ?U

## 2021-06-27 NOTE — Progress Notes (Signed)
Dressing change due for PICC.  Pt. Is in chair. Pt. Has possible d/c tomorrow 5/10 if continued improvement. Pt. Is only receiving IV Lasix with hope that will be changed to PO today. Spoke with pt. And wife who agree to assess tomorrow for PICC removal due to possible d/c and no IV medications and to hold off on dressing change today. Dressing is clean, dry, intact. ?

## 2021-06-27 NOTE — Progress Notes (Signed)
Physical Therapy Treatment ?Patient Details ?Name: Roberto Ponce ?MRN: [id number] ?DOB: Oct 06, 1950 ?Today's Date: 06/27/2021 ? ? ?History of Present Illness Roberto Ponce is a 71 y.o. male with medical history significant of CAD, MI s/p CABG, HLD, recent testicular seminoma s/p orchectomy presented to ED with a complaint of cough/shortness of breath.  Admitted under hospitalist service with community-acquired pneumonia Intubated 5/2-5/05-2021. ? ?  ?PT Comments  ? ? Pt completed several trials of ambulation using RW while monitoring SpO2 on RA. Pt was 96% seated at rest on RA decreased to 87% after ambulating 1 minute, 127f, quickly returning to 96% after 1 minute seated rest break. no c/o of SOB. Pt educated on proper use of acapella, pt stated production of clear sputum with coughing. Also educated on pacing, energy conservation, and avoiding valsalva maneuver. Pt and wife with good understanding and feel comfortable with d/c home on RA once medically cleared.  ?  ?Recommendations for follow up therapy are one component of a multi-disciplinary discharge planning process, led by the attending physician.  Recommendations may be updated based on patient status, additional functional criteria and insurance authorization. ? ?Follow Up Recommendations ? Home health PT ?  ?  ?Assistance Recommended at Discharge Set up Supervision/Assistance  ?Patient can return home with the following A little help with walking and/or transfers;A little help with bathing/dressing/bathroom;Help with stairs or ramp for entrance;Assistance with cooking/housework;Assist for transportation ?  ?Equipment Recommendations ? Rolling walker (2 wheels)  ?  ?Recommendations for Other Services   ? ? ?  ?Precautions / Restrictions Precautions ?Precautions:  (monitor O2) ?Restrictions ?Weight Bearing Restrictions: No  ?  ? ?Mobility ? Bed Mobility ?  ?  ?  ?  ?  ?  ?  ?General bed mobility comments: in recliner ?  ? ?Transfers ?Overall transfer  level: Needs assistance ?Equipment used: Rolling walker (2 wheels) ?Transfers: Sit to/from Stand ?Sit to Stand: Supervision ?  ?  ?  ?  ?  ?  ?  ? ?Ambulation/Gait ?Ambulation/Gait assistance: Supervision, Min guard ?Gait Distance (Feet): 140 Feet ?Assistive device: Rolling walker (2 wheels) ?Gait Pattern/deviations: Step-through pattern ?  ?  ?  ?General Gait Details: No rest breaks, steady pace ? ? ?Stairs ?  ?  ?  ?  ?  ? ? ?Wheelchair Mobility ?  ? ?Modified Rankin (Stroke Patients Only) ?  ? ? ?  ?Balance   ?  ?  ?  ?  ?  ?  ?  ?  ?  ?  ?  ?  ?  ?  ?  ?  ?  ?  ?  ? ?  ?Cognition Arousal/Alertness: Awake/alert ?Behavior During Therapy: [facility] tasks assessed/performed ?Overall Cognitive Status: Within Functional Limits for tasks assessed ?  ?  ?  ?  ?  ?  ?  ?  ?  ?  ?  ?  ?  ?  ?  ?  ?  ?  ?  ? ?  ?Exercises   ? ?  ?General Comments General comments (skin integrity, edema, etc.): Pt educated on valsalva maneuver and to avoid, proper use of acapella for airway clearance, and pacing techniques to prevent drop in saturation level with good understanding. ?  ?  ? ?Pertinent Vitals/Pain Pain Assessment ?Pain Assessment: No/denies pain  ? ? ?Home Living   ?  ?  ?  ?  ?  ?  ?  ?  ?  ?   ?  ?Prior  Function    ?  ?  ?   ? ?PT Goals (current goals can now be found in the care plan section) Acute Rehab PT Goals ?Patient Stated Goal: to  get back to exercising ?Progress towards PT goals: Progressing toward goals ? ?  ?Frequency ? ? ? Min 3X/week ? ? ? ?  ?PT Plan Current plan remains appropriate  ? ? ?Co-evaluation   ?  ?  ?  ?  ? ?  ?AM-PAC PT "6 Clicks" Mobility   ?Outcome Measure ? Help needed turning from your back to your side while in a flat bed without using bedrails?: A Little ?Help needed moving from lying on your back to sitting on the side of a flat bed without using bedrails?: A Little ?Help needed moving to and from a bed to a chair (including a wheelchair)?: A Little ?Help needed standing up from a chair  using your arms (e.g., wheelchair or bedside chair)?: A Little ?Help needed to walk in hospital room?: A Little ?Help needed climbing 3-5 steps with a railing? : A Lot ?6 Click Score: 17 ? ?  ?End of Session Equipment Utilized During Treatment: Gait belt (Pulse Ox) ?Activity Tolerance: Patient tolerated treatment well ?Patient left: in chair;with call bell/phone within reach;with family/visitor present ?Nurse Communication: Mobility status (O2 sats) ?PT Visit Diagnosis: Difficulty in walking, not elsewhere classified (R26.2) ?  ? ? ?Time: 1937-9024 ?PT Time Calculation (min) (ACUTE ONLY): 38 min ? ?Charges:  $Gait Training: 23-37 mins ?$Therapeutic Activity: 8-22 mins          ?          ?Roberto Ponce, PTA ? ? ? ?Roberto Ponce ?06/27/2021, 1:11 PM ? ?

## 2021-06-27 NOTE — TOC Progression Note (Signed)
Transition of Care (TOC) - Progression Note  ? ? ?Patient Details  ?Name: DAIJON WENKE ?MRN: [id number] ?Date of Birth: 1950-09-30 ? ?Transition of Care (TOC) CM/SW Contact  ?Ross Ludwig, LCSW ?Phone Number: ?06/27/2021, 5:09 PM ? ?Clinical Narrative:    ? ?CSW set up patient for home health PT, OT, RN, and social work through [employer].  CSW attempted to contact patient's spouse and let her know.  CSW left a message on voice mail.  CSW to continue to follow patient's progress throughout discharge planning. ? ? ?Expected Discharge Plan:  (TBD) ?Barriers to Discharge: Continued Medical Work up ? ?Expected Discharge Plan and Services ?Expected Discharge Plan:  (TBD) ?  ?Discharge Planning Services: CM Consult ?  ?Living arrangements for the past 2 months: [place] ?                ?  ?  ?  ?  ?  ?  ?  ?  ?  ?  ? ? ?Social Determinants of Health (SDOH) Interventions ?  ? ?Readmission Risk Interventions ?   ? View : No data to display.  ?  ?  ?  ? ? ?

## 2021-06-28 DIAGNOSIS — E871 Hypo-osmolality and hyponatremia: Secondary | ICD-10-CM | POA: Diagnosis not present

## 2021-06-28 DIAGNOSIS — D72829 Elevated white blood cell count, unspecified: Secondary | ICD-10-CM | POA: Diagnosis not present

## 2021-06-28 DIAGNOSIS — I5021 Acute systolic (congestive) heart failure: Secondary | ICD-10-CM | POA: Diagnosis not present

## 2021-06-28 DIAGNOSIS — J9601 Acute respiratory failure with hypoxia: Secondary | ICD-10-CM | POA: Diagnosis not present

## 2021-06-28 LAB — CBC
HCT: 31.5 % — ABNORMAL LOW (ref 39.0–52.0)
Hemoglobin: 10.5 g/dL — ABNORMAL LOW (ref 13.0–17.0)
MCH: 29.8 pg (ref 26.0–34.0)
MCHC: 33.3 g/dL (ref 30.0–36.0)
MCV: 89.5 fL (ref 80.0–100.0)
Platelets: 305 10*3/uL (ref 150–400)
RBC: 3.52 MIL/uL — ABNORMAL LOW (ref 4.22–5.81)
RDW: 14.8 % (ref 11.5–15.5)
WBC: 13.2 10*3/uL — ABNORMAL HIGH (ref 4.0–10.5)
nRBC: 0 % (ref 0.0–0.2)

## 2021-06-28 LAB — COMPREHENSIVE METABOLIC PANEL
ALT: 100 U/L — ABNORMAL HIGH (ref 0–44)
AST: 39 U/L (ref 15–41)
Albumin: 2.6 g/dL — ABNORMAL LOW (ref 3.5–5.0)
Alkaline Phosphatase: 80 U/L (ref 38–126)
Anion gap: 9 (ref 5–15)
BUN: 18 mg/dL (ref 8–23)
CO2: 27 mmol/L (ref 22–32)
Calcium: 8.2 mg/dL — ABNORMAL LOW (ref 8.9–10.3)
Chloride: 94 mmol/L — ABNORMAL LOW (ref 98–111)
Creatinine, Ser: 0.67 mg/dL (ref 0.61–1.24)
GFR, Estimated: >60 mL/min (ref >60)
Glucose, Bld: 109 mg/dL — ABNORMAL HIGH (ref 70–99)
Potassium: 4 mmol/L (ref 3.5–5.1)
Sodium: 130 mmol/L — ABNORMAL LOW (ref 135–145)
Total Bilirubin: 1 mg/dL (ref 0.3–1.2)
Total Protein: 6.9 g/dL (ref 6.5–8.1)

## 2021-06-28 LAB — GLUCOSE, CAPILLARY
Glucose-Capillary: 111 mg/dL — ABNORMAL HIGH (ref 70–99)
Glucose-Capillary: 117 mg/dL — ABNORMAL HIGH (ref 70–99)

## 2021-06-28 LAB — PROCALCITONIN: Procalcitonin: <0.1 ng/mL

## 2021-06-28 MED ORDER — DAPAGLIFLOZIN PROPANEDIOL 10 MG PO TABS: 10.0000 mg | ORAL_TABLET | Freq: Every day | ORAL | 2 refills | Status: DC

## 2021-06-28 MED ORDER — FUROSEMIDE 40 MG PO TABS: 40.0000 mg | ORAL_TABLET | Freq: Two times a day (BID) | ORAL | 3 refills | Status: DC

## 2021-06-28 MED ORDER — CARVEDILOL 3.125 MG PO TABS: 3.1250 mg | ORAL_TABLET | Freq: Two times a day (BID) | ORAL | 2 refills | Status: DC

## 2021-06-28 MED ORDER — SPIRONOLACTONE 25 MG PO TABS: 12.5000 mg | ORAL_TABLET | Freq: Every day | ORAL | 2 refills | Status: DC

## 2021-06-28 NOTE — Discharge Summary (Signed)
?Physician Discharge Summary ?  ?Patient: Roberto Ponce MRN: [id number] DOB: 06-06-1950  ?Admit date:     06/18/2021  ?Discharge date: 06/28/21  ?Discharge Physician: Oswald Hillock  ? ?PCP: Kristen Loader, FNP  ? ?Recommendations at discharge:  ? ?Follow-up cardiology as outpatient on 08/23/2021 ?Follow-up PCP in 2 weeks ? ?Discharge Diagnoses: ?Principal Problem: ?  Acute respiratory failure with hypoxia ([place]) ?Active Problems: ?  CAD (coronary artery disease) of artery bypass graft ?  Hyponatremia ?  Acute systolic CHF (congestive heart failure) ([place]) ?  MR (mitral regurgitation) ?  Abnormal LFTs ?  Normocytic anemia ?  Leucocytosis ? ?Resolved Problems: ?  Shock ([place]) ?  Hypotension due to medication ? ?Hospital Course: ?55 yom w/ CAD s/p STEMI in the 1980s ,recent course complicated by mitral valve prolapse with worsening regurgitation noted on TTE, and TEE,classified as severe. Workup has included a left/right heart cath, which identified a 95% LCx occlusion treated with PCI. CVTS workup is pending once he is no longer requiring anti-platelet agents. Recent course also complicated by diagnosis of testicular cancer in march of this year treated with right radical inguinal orchiectomy. No other therapy indicated at this time, presented to ED 5/1 with shortness of breath x1 week with mild cough , orthopnea. See at urgent care 4/27 and was diagnosed as viral pneumonia and was discharged, but presented to ED 5/1 due to worsening symptoms.  Initially admitted as CAP -Improved but got significantly worse 5/2-placed on BiPAP, PCCM consulted-moved to ICU-diagnosed as systolc CHF, intubated and finally extubated.  Seen by cardiology.Patient transferred to [facility] service 5/5.  ? ? ? ?Assessment and Plan: ? ?Acute respiratory failure with hypoxia: ?Required mechanical ventilation in ICU.CT chest on 5/2 showed extensive consolidation bilaterally with pleural effusions likely due to pneumonia.  However subsequently felt to be  related to CHF rather than pneumonia per PCCM and was taken off antibiotics. ?Repeat chest x-ray on 5/5 shows improvement.CXR:06/26/21 shows persistent but improved bilateral asymmetric airspace disease left greater than right. PCCM has signed off.   ?-Patient did get antibiotics:Vancomycin 5/1-5/3, Ceftriaxone 4/30-5/2 > Cefepime 5/2- 5/4 ( 5 days), azithro 4/30- 5/4 and subsequently stopped.Procalcitonin this morning reassuring less than 0.1. Has persistent leukocytosis but patient was on IV Solu-Cortef until 06/24/21 that could contribute.   ? ?-Started on Lasix 40 mg p.o. twice daily ?-Pulse oximetry on ambulation, requires 2 L pulm and off oxygen ? ? ? ?Acute systolic CHF, lvef 03-55%: Echo EF 40-45%, GIIDD: Continue GDMT with Aldactone Farxiga, losartan,  ?-We will discharge on Lasix 40 mg p.o. twice daily ?-Patient to follow-up with cardiology as outpatient, has an appointment on 08/23/2021 ?-We will also need repeat echo on 08/23/2021 ? ?     ?Filed Weights  ?  06/25/21 0500 06/26/21 0500 06/27/21 0415  ?Weight: 79.1 kg 74.8 kg 73.9 kg  ?  ?Moderately to severe MR: Cardiology to follow-up as outpatient ? ?[place] 01/2021 with PCI to mid circumflex continue aspirin Plavix,holding statin due to elevated LFTs ?  ?Hyponatremia: Stable at 130 ?  ?Abnormal LFTs: Likely from CHF. AST/ALT significantly better.Hold off on statin till seen by cardiology as outpatient ?  ?Normocytic anemia:Hb stable 10-11 gm ? ?  ? ? ?Consultants: Cardiology ?Procedures performed:  ?Disposition: Home ?Diet recommendation:  ?Discharge Diet Orders (From admission, onward)  ? ?  Start     Ordered  ? 06/28/21 0000  Diet - low sodium heart healthy       ? 06/28/21  1332  ? ?  ?  ? ?  ? ?Cardiac diet ?DISCHARGE MEDICATION: ?Allergies as of 06/28/2021   ?No Known Allergies ?  ? ?  ?Medication List  ?  ? ?STOP taking these medications   ? ?rosuvastatin 40 MG tablet ?Commonly known as: CRESTOR ?  ? ?  ? ?TAKE these medications   ? ?acetaminophen 500 MG  tablet ?Commonly known as: TYLENOL ?Take 1,000 mg by mouth every 6 (six) hours as needed for moderate pain. ?  ?aspirin EC 81 MG tablet ?Take 1 tablet (81 mg total) by mouth daily. ?  ?brimonidine 0.2 % ophthalmic solution ?Commonly known as: ALPHAGAN ?Place 1 drop into the left eye 2 (two) times daily. ?  ?carvedilol 3.125 MG tablet ?Commonly known as: COREG ?Take 1 tablet (3.125 mg total) by mouth 2 (two) times daily with a meal. ?  ?clopidogrel 75 MG tablet ?Commonly known as: PLAVIX ?Take 1 tablet (75 mg total) by mouth daily with breakfast. ?  ?dapagliflozin propanediol 10 MG Tabs tablet ?Commonly known as: FARXIGA ?Take 1 tablet (10 mg total) by mouth daily. ?Start taking on: Jun 29, 2021 ?  ?dorzolamide-timolol 22.3-6.8 MG/ML ophthalmic solution ?Commonly known as: COSOPT ?Place 1 drop into both eyes 2 (two) times daily. ?  ?ezetimibe 10 MG tablet ?Commonly known as: ZETIA ?TAKE 1 TABLET(10 MG) BY MOUTH DAILY ?What changed:  ?how much to take ?how to take this ?when to take this ?additional instructions ?  ?furosemide 40 MG tablet ?Commonly known as: Lasix ?Take 1 tablet (40 mg total) by mouth 2 (two) times daily. ?  ?latanoprost 0.005 % ophthalmic solution ?Commonly known as: XALATAN ?Place 1 drop into both eyes at bedtime. ?  ?nitroGLYCERIN 0.4 MG SL tablet ?Commonly known as: Nitrostat ?Place 1 tablet (0.4 mg total) under the tongue every 5 (five) minutes as needed for chest pain. ?  ?oxycodone 5 MG capsule ?Commonly known as: OXY-IR ?Take 1 capsule (5 mg total) by mouth every 4 (four) hours as needed. ?  ?Sodium Fluoride 5000 PPM 1.1 % Pste ?Generic drug: Sodium Fluoride ?Apply 1 application. topically in the morning and at bedtime. ?  ?spironolactone 25 MG tablet ?Commonly known as: ALDACTONE ?Take 0.5 tablets (12.5 mg total) by mouth daily. ?Start taking on: Jun 29, 2021 ?  ?triamcinolone cream 0.1 % ?Commonly known as: KENALOG ?Apply 1 application topically 2 (two) times daily as needed (eczema). ?   ? ?  ? ?  ?  ? ? ?  ?Durable Medical Equipment  ?(From admission, onward)  ?  ? ? ?  ? ?  Start     Ordered  ? 06/28/21 1000  For home use only DME Walker  Once       ?Question:  Patient needs a walker to treat with the following condition  Answer:  CHF exacerbation ([place])  ? 06/28/21 0959  ? ?  ?  ? ?  ? ? Follow-up Information   ? ? [place].   ?Specialty: Home Health Services ?Why: [facility] wil contact you to set up the first visit for home health. ?Contact information: ?[address] ?[place] [phone] ?[phone] ? ? ?  ?  ? ?  ?  ? ?  ? ?Discharge Exam: ?Filed Weights  ? 06/25/21 0500 06/26/21 0500 06/27/21 0415  ?Weight: 79.1 kg 74.8 kg 73.9 kg  ? ?General-appears in no acute distress ?Heart-S1-S2, regular, no murmur auscultated ?Lungs-clear to auscultation  bilaterally, no wheezing or crackles auscultated ?Abdomen-soft, nontender, no organomegaly ?Extremities-no edema in the lower extremities ?Neuro-alert, oriented x3, no focal deficit noted ? ?Condition at discharge: good ? ?The results of significant diagnostics from this hospitalization (including imaging, microbiology, ancillary and laboratory) are listed below for reference.  ? ?Imaging Studies: ?DG Chest 2 View ? ?Result Date: 06/26/2021 ?CLINICAL DATA:  See HF with cough. EXAM: CHEST - 2 VIEW COMPARISON:  06/23/2021 FINDINGS: Mildly improved bilateral asymmetric airspace disease, more diffuse on the left than the right. No substantial pleural effusion. The cardiopericardial silhouette is within normal limits for size. Right PICC line tip overlies the mid SVC level. Telemetry leads overlie the chest. IMPRESSION: Persistent but improved bilateral asymmetric airspace disease, left greater than right. Electronically Signed   By: Misty Stanley M.D.   On: 06/26/2021 09:58  ? ?DG Chest 2 View ? ?Result Date: 06/18/2021 ?CLINICAL DATA:  Shortness of breath.  Fever, chills, cough. EXAM: CHEST - 2 VIEW  COMPARISON:  Chest CTA 03/16/2021 FINDINGS: Post median sternotomy and CABG. The heart is normal in size. There is peripheral subpleural reticulation the basilar predominant distribution that is similar to prior

## 2021-06-28 NOTE — Progress Notes (Signed)
Patient reports possible discharge today and would like to wait for dressing change pending d/c order. RN advised that if he does not get discharged today that the dressing will need to be changed today as it is overdue. RN to monitor d/c status. Patient and family verbalized understanding.  ? ?Rhonda Linan Lorita Officer, RN ? ?

## 2021-06-28 NOTE — Progress Notes (Signed)
? ?Progress Note ? ?Patient Name: Roberto Ponce ?Date of Encounter: 06/28/2021 ? ?[facility] Cardiologist: Werner Lean, MD  ? ?Subjective  ? ?Stayed extra day for ambulatory hypoxia. ?We had discussed with PT and Dr. Lupita Leash that we would attempt one additional day of IV diuretic and that if he still felt well, we may need outpatient O2. ? ?Feels better.  No CP. No Palpitations. ? ?Inpatient Medications  ?  ?Scheduled Meds: ? aspirin EC  81 mg Oral Daily  ? brimonidine  1 drop Left Eye BID  ? carvedilol  3.125 mg Oral BID WC  ? Chlorhexidine Gluconate Cloth  6 each Topical Daily  ? clopidogrel  75 mg Oral Q breakfast  ? dapagliflozin propanediol  10 mg Oral Daily  ? docusate sodium  100 mg Oral BID  ? dorzolamide-timolol  1 drop Both Eyes BID  ? enoxaparin (LOVENOX) injection  40 mg Subcutaneous Q24H  ? ezetimibe  10 mg Oral QHS  ? insulin aspart  0-15 Units Subcutaneous TID WC  ? latanoprost  1 drop Both Eyes QHS  ? mouth rinse  15 mL Mouth Rinse BID  ? melatonin  3 mg Oral QHS  ? pantoprazole  40 mg Oral Daily  ? polyethylene glycol  17 g Oral Daily  ? sodium chloride flush  10-40 mL Intracatheter Q12H  ? sodium chloride flush  3 mL Intravenous Q12H  ? sodium chloride  1 g Oral BID WC  ? spironolactone  12.5 mg Oral Daily  ? ?Continuous Infusions: ? sodium chloride Stopped (06/20/21 0222)  ? sodium chloride Stopped (06/24/21 1314)  ? ?PRN Meds: ?sodium chloride, albuterol, nitroGLYCERIN, ondansetron **OR** ondansetron (ZOFRAN) IV, sodium chloride flush  ? ?Vital Signs  ?  ?Vitals:  ? 06/27/21 1751 06/27/21 1948 06/28/21 0445 06/28/21 0705  ?BP: 119/64 (!) 103/57 (!) 111/58   ?Pulse: 86 82 75   ?Resp:  17 17   ?Temp:  98.9 ?F (37.2 ?C) 97.8 ?F (36.6 ?C)   ?TempSrc:   Oral   ?SpO2: 93% 95% 93% 90%  ?Weight:      ?Height:      ? ? ?Intake/Output Summary (Last 24 hours) at 06/28/2021 0845 ?Last data filed at 06/28/2021 0330 ?Gross per 24 hour  ?Intake 826 ml  ?Output 1550 ml  ?Net -724 ml  ? ? ?   06/27/2021  ?  4:15 AM 06/26/2021  ?  5:00 AM 06/25/2021  ?  5:00 AM  ?Last 3 Weights  ?Weight (lbs) 163 lb 165 lb 174 lb 4.8 oz  ?Weight (kg) 73.936 kg 74.844 kg 79.062 kg  ?   ? ?Telemetry  ?  ?SR, PVCs, similar to 06/27/21 - Personally Reviewed ? ?Physical Exam  ? ?GEN: No acute distress.   ?Neck: No JVD ?Cardiac: RRR,  systolic murmur ?Respiratory: Improved chronic on L lung base ?GI: Soft, nontender, non-distended  ?MS: +1 pitting R leg pitting edema, no LE edema ?Neuro:  Nonfocal  ?Psych: Normal affect  ? ?Labs  ?  ?High Sensitivity Troponin:   ?Recent Labs  ?Lab 06/20/21 ?1723 06/21/21 ?0500  ?TROPONINIHS 61* 16  ?   ?Chemistry ?Recent Labs  ?Lab 06/22/21 ?0412 06/23/21 ?8099 06/26/21 ?8338 06/27/21 ?2505 06/28/21 ?0354  ?NA 132*   < > 133* 130* 130*  ?K 3.5   < > 4.3 4.3 4.0  ?CL 99   < > 97* 95* 94*  ?CO2 27   < > '29 28 27  '$ ?GLUCOSE 143*   < >  92 122* 109*  ?BUN 13   < > 24* 23 18  ?CREATININE 0.54*   < > 0.61 0.70 0.67  ?CALCIUM 7.6*   < > 8.2* 8.2* 8.2*  ?MG 2.3  --   --   --   --   ?PROT 5.3*   < > 6.2* 6.8 6.9  ?ALBUMIN 2.0*   < > 2.4* 2.5* 2.6*  ?AST 321*   < > 51* 37 39  ?ALT 297*   < > 186* 128* 100*  ?ALKPHOS 83   < > 90 83 80  ?BILITOT 0.7   < > 1.1 1.1 1.0  ?GFRNONAA >60   < > >60 >60 >60  ?ANIONGAP 6   < > '7 7 9  '$ ? < > = values in this interval not displayed.  ?  ?Lipids  ?No results for input(s): CHOL, TRIG, HDL, LABVLDL, LDLCALC, CHOLHDL in the last 168 hours. ?  ?Hematology ?Recent Labs  ?Lab 06/26/21 ?1950 06/27/21 ?9326 06/28/21 ?0354  ?WBC 15.2* 15.3* 13.2*  ?RBC 3.62* 3.54* 3.52*  ?HGB 11.0* 11.0* 10.5*  ?HCT 32.5* 32.1* 31.5*  ?MCV 89.8 90.7 89.5  ?MCH 30.4 31.1 29.8  ?MCHC 33.8 34.3 33.3  ?RDW 14.9 14.7 14.8  ?PLT 332 316 305  ? ?Thyroid No results for input(s): TSH, FREET4 in the last 168 hours.  ?BNP ?Recent Labs  ?Lab 06/23/21 ?7124 06/27/21 ?0436  ?BNP 229.2* 130.6*  ?  ?DDimer No results for input(s): DDIMER in the last 168 hours.  ? ?Radiology  ?  ?DG Chest 2 View ? ?Result Date:  06/26/2021 ?CLINICAL DATA:  See HF with cough. EXAM: CHEST - 2 VIEW COMPARISON:  06/23/2021 FINDINGS: Mildly improved bilateral asymmetric airspace disease, more diffuse on the left than the right. No substantial pleural effusion. The cardiopericardial silhouette is within normal limits for size. Right PICC line tip overlies the mid SVC level. Telemetry leads overlie the chest. IMPRESSION: Persistent but improved bilateral asymmetric airspace disease, left greater than right. Electronically Signed   By: Misty Stanley M.D.   On: 06/26/2021 09:58   ? ?Cardiac Studies  ? ?TTE 06/20/2021 ? 1. Left ventricular ejection fraction, by estimation, is 40 to 45%. The  ?left ventricle has mildly decreased function. The left ventricle  ?demonstrates regional wall motion abnormalities (see scoring  ?diagram/findings for description). There is mild left  ?ventricular hypertrophy. Left ventricular diastolic parameters are  ?consistent with Grade II diastolic dysfunction (pseudonormalization).  ?Elevated left atrial pressure.  ? 2. Right ventricular systolic function is mildly reduced. The right  ?ventricular size is mildly enlarged. There is moderately elevated  ?pulmonary artery systolic pressure. The estimated right ventricular  ?systolic pressure is 58.0 mmHg.  ? 3. Tricuspid valve regurgitation is mild to moderate.  ? 4. The aortic valve is tricuspid. Aortic valve regurgitation is not  ?visualized. Aortic valve sclerosis is present, with no evidence of aortic  ?valve stenosis.  ? 5. The inferior vena cava is dilated in size with <50% respiratory  ?variability, suggesting right atrial pressure of 15 mmHg.  ? 6. The mitral valve is abnormal. Moderate mitral valve regurgitation.  ?Eccentric posterior directed MR jet. Difficult to quantify severity given  ?eccentric jet, but normal LA/LV size argues against severe MR. Consider  ?TEE or cardiac MRI for further  ?evaluation  ? ?Patient Profile  ?   ?71 y.o. male  with CAD status post  CABG, recent PCI, severe mitral valve regurgitation, testicular cancer status post orchiectomy who was admitted  on 06/18/2021 for acute hypoxic respiratory failure secondary to pneumonia.  Cardiology consulted due to history of severe mitral valve regurgitation. ? ?Assessment & Plan  ?  ?Acute hypoxic respiratory failure ?PNA ?Acute systolic heart failure - new ?Moderate to severe MR ?- Required brief intubation on arrival, has been extubated.  ?- CXR suggest PNA has improved by imaging has not completely resolved ?- persistent leukocytosis with WBC 15.3, improved to 13 today ?- LVEF 40-45% ?- lasix 40 mg PO BID ?- GDMT includes spironolactone, farxiga, coreg (hypotension on losartan) ?- 12.8 L cumulative ?- we have discussed indications for increased PO lasix, and reasons for repeat evaluation (DOD visit for worsening weight gain SOB, or le swelling; PCP for worsening cough, fever or B symtptoms) ?- continue PT maneuvers ?- wife has bedside question book at bedside, we have review this at length ? ?CAD s/p CABG ?- LHC 01/2021 with PCI to mid CX ?- continue DAPT with ASA and plavix ?- HS troponin mildly elevated and flat, inconsistent with ACS ? ?Moderate to severe mitral regurgitation ?- outpatient repeat echo may prompt RHC and potentially TEE ? ? ?Elevated LFTs ?- per primary ?- holding statin until resolution ? ? ?[facility]  will sign off.   ?Medication Recommendations:  lasix 40 mg PO BID and GDMT as above/ordered, statin hold until outpatient eval by PCP or Cards ?Other recommendations (labs, testing, etc):  08/23/21 echo ?Follow up as an outpatient:  08/23/21 f/u ? ? ? ?For questions or updates, please contact [facility]  ?Please consult [email or web address] for contact info under  ? ?  ?   ?Signed, ?Werner Lean, MD  ?06/28/2021, 8:45 AM   ? ? ? ?

## 2021-06-28 NOTE — Progress Notes (Signed)
Went over discharge instructions w/ pt and pt wife Roberto Ponce. Both verbalized understanding.  ?

## 2021-06-28 NOTE — Progress Notes (Signed)
SATURATION QUALIFICATIONS: (This note is used to comply with regulatory documentation for home oxygen) ? ?Patient Saturations on Room Air at Rest = 95% ? ?Patient Saturations on Room Air while Ambulating = 82% ? ?Patient Saturations on 2 Liters of oxygen while Ambulating = 91% ? ?Please briefly explain why patient needs home oxygen: Patient desats w/ ambulation down to 82% on room air and when he ambulates w/ 2L [place] his oxygen saturation is 91%.  ?

## 2021-06-28 NOTE — TOC Progression Note (Signed)
Transition of Care (TOC) - Progression Note  ? ? ?Patient Details  ?Name: MAZEN MARCIN ?MRN: [id number] ?Date of Birth: 14-Oct-1950 ? ?Transition of Care (TOC) CM/SW Contact  ?[place], LCSW ?Phone Number: ?06/28/2021, 10:20 AM ? ?Clinical Narrative:    ? ?CSW met with pt and pt wife bedside. CSW provided update that [facility] is arranged with [facility]. PT is recommending a rolling walker; pt does not have one at home and they would like one delivered to room prior to DC. They are also hoping to get home oxygen arranged.  ? ?CSW is informed by RN of walking o2 sats; pt will need home o2. CSW ordered walker and o2 from [employer].  ? ?Expected Discharge Plan: [place] ?Barriers to Discharge: No Barriers Identified ? ?Expected Discharge Plan and Services ?Expected Discharge Plan: [place] ?  ?Discharge Planning Services: CM Consult ?  ?Living arrangements for the past 2 months: [place] ?                ?  ?  ?  ?  ?  ?  ?  ?  ?  ?  ? ? ?Social Determinants of Health (SDOH) Interventions ?  ? ?Readmission Risk Interventions ?   ? View : No data to display.  ?  ?  ?  ? ? ?

## 2021-06-30 DIAGNOSIS — I340 Nonrheumatic mitral (valve) insufficiency: Secondary | ICD-10-CM | POA: Diagnosis not present

## 2021-06-30 DIAGNOSIS — I25810 Atherosclerosis of coronary artery bypass graft(s) without angina pectoris: Secondary | ICD-10-CM | POA: Diagnosis not present

## 2021-06-30 DIAGNOSIS — J189 Pneumonia, unspecified organism: Secondary | ICD-10-CM | POA: Diagnosis not present

## 2021-06-30 DIAGNOSIS — I2510 Atherosclerotic heart disease of native coronary artery without angina pectoris: Secondary | ICD-10-CM | POA: Diagnosis not present

## 2021-06-30 DIAGNOSIS — I252 Old myocardial infarction: Secondary | ICD-10-CM | POA: Diagnosis not present

## 2021-06-30 DIAGNOSIS — J45909 Unspecified asthma, uncomplicated: Secondary | ICD-10-CM | POA: Diagnosis not present

## 2021-06-30 DIAGNOSIS — I872 Venous insufficiency (chronic) (peripheral): Secondary | ICD-10-CM | POA: Diagnosis not present

## 2021-06-30 DIAGNOSIS — I422 Other hypertrophic cardiomyopathy: Secondary | ICD-10-CM | POA: Diagnosis not present

## 2021-06-30 DIAGNOSIS — E871 Hypo-osmolality and hyponatremia: Secondary | ICD-10-CM | POA: Diagnosis not present

## 2021-06-30 DIAGNOSIS — I350 Nonrheumatic aortic (valve) stenosis: Secondary | ICD-10-CM | POA: Diagnosis not present

## 2021-07-04 ENCOUNTER — Encounter (HOSPITAL_COMMUNITY): Payer: Self-pay

## 2021-07-04 ENCOUNTER — Other Ambulatory Visit: Payer: Self-pay

## 2021-07-04 ENCOUNTER — Emergency Department (HOSPITAL_COMMUNITY): Payer: PPO

## 2021-07-04 ENCOUNTER — Inpatient Hospital Stay (HOSPITAL_COMMUNITY)
Admission: EM | Admit: 2021-07-04 | Discharge: 2021-07-12 | DRG: 199 | Disposition: A | Discharge status: Home / Self Care | Payer: PPO | Attending: Student | Admitting: Student

## 2021-07-04 DIAGNOSIS — Z8701 Personal history of pneumonia (recurrent): Secondary | ICD-10-CM | POA: Diagnosis not present

## 2021-07-04 DIAGNOSIS — I509 Heart failure, unspecified: Secondary | ICD-10-CM | POA: Diagnosis not present

## 2021-07-04 DIAGNOSIS — Z823 Family history of stroke: Secondary | ICD-10-CM

## 2021-07-04 DIAGNOSIS — J45909 Unspecified asthma, uncomplicated: Secondary | ICD-10-CM | POA: Diagnosis present

## 2021-07-04 DIAGNOSIS — Z8639 Personal history of other endocrine, nutritional and metabolic disease: Secondary | ICD-10-CM

## 2021-07-04 DIAGNOSIS — I340 Nonrheumatic mitral (valve) insufficiency: Secondary | ICD-10-CM | POA: Diagnosis not present

## 2021-07-04 DIAGNOSIS — J982 Interstitial emphysema: Secondary | ICD-10-CM | POA: Diagnosis present

## 2021-07-04 DIAGNOSIS — E871 Hypo-osmolality and hyponatremia: Secondary | ICD-10-CM

## 2021-07-04 DIAGNOSIS — Z951 Presence of aortocoronary bypass graft: Secondary | ICD-10-CM | POA: Diagnosis not present

## 2021-07-04 DIAGNOSIS — E782 Mixed hyperlipidemia: Secondary | ICD-10-CM | POA: Diagnosis present

## 2021-07-04 DIAGNOSIS — I341 Nonrheumatic mitral (valve) prolapse: Secondary | ICD-10-CM | POA: Diagnosis present

## 2021-07-04 DIAGNOSIS — Z7902 Long term (current) use of antithrombotics/antiplatelets: Secondary | ICD-10-CM

## 2021-07-04 DIAGNOSIS — Z7982 Long term (current) use of aspirin: Secondary | ICD-10-CM

## 2021-07-04 DIAGNOSIS — I252 Old myocardial infarction: Secondary | ICD-10-CM

## 2021-07-04 DIAGNOSIS — I82431 Acute embolism and thrombosis of right popliteal vein: Secondary | ICD-10-CM | POA: Diagnosis present

## 2021-07-04 DIAGNOSIS — J9601 Acute respiratory failure with hypoxia: Secondary | ICD-10-CM | POA: Diagnosis present

## 2021-07-04 DIAGNOSIS — E222 Syndrome of inappropriate secretion of antidiuretic hormone: Secondary | ICD-10-CM | POA: Diagnosis present

## 2021-07-04 DIAGNOSIS — J939 Pneumothorax, unspecified: Secondary | ICD-10-CM | POA: Diagnosis present

## 2021-07-04 DIAGNOSIS — Z9981 Dependence on supplemental oxygen: Secondary | ICD-10-CM

## 2021-07-04 DIAGNOSIS — D72825 Bandemia: Secondary | ICD-10-CM | POA: Diagnosis present

## 2021-07-04 DIAGNOSIS — I2510 Atherosclerotic heart disease of native coronary artery without angina pectoris: Secondary | ICD-10-CM | POA: Diagnosis present

## 2021-07-04 DIAGNOSIS — J8489 Other specified interstitial pulmonary diseases: Secondary | ICD-10-CM | POA: Diagnosis not present

## 2021-07-04 DIAGNOSIS — K921 Melena: Secondary | ICD-10-CM | POA: Diagnosis not present

## 2021-07-04 DIAGNOSIS — I82441 Acute embolism and thrombosis of right tibial vein: Secondary | ICD-10-CM | POA: Diagnosis present

## 2021-07-04 DIAGNOSIS — R54 Age-related physical debility: Secondary | ICD-10-CM | POA: Diagnosis present

## 2021-07-04 DIAGNOSIS — Z8547 Personal history of malignant neoplasm of testis: Secondary | ICD-10-CM

## 2021-07-04 DIAGNOSIS — T380X5A Adverse effect of glucocorticoids and synthetic analogues, initial encounter: Secondary | ICD-10-CM | POA: Diagnosis present

## 2021-07-04 DIAGNOSIS — J9621 Acute and chronic respiratory failure with hypoxia: Secondary | ICD-10-CM | POA: Diagnosis present

## 2021-07-04 DIAGNOSIS — J810 Acute pulmonary edema: Secondary | ICD-10-CM | POA: Diagnosis present

## 2021-07-04 DIAGNOSIS — R918 Other nonspecific abnormal finding of lung field: Secondary | ICD-10-CM | POA: Diagnosis not present

## 2021-07-04 DIAGNOSIS — Z0389 Encounter for observation for other suspected diseases and conditions ruled out: Secondary | ICD-10-CM | POA: Diagnosis not present

## 2021-07-04 DIAGNOSIS — I110 Hypertensive heart disease with heart failure: Secondary | ICD-10-CM | POA: Diagnosis not present

## 2021-07-04 DIAGNOSIS — J189 Pneumonia, unspecified organism: Secondary | ICD-10-CM | POA: Diagnosis not present

## 2021-07-04 DIAGNOSIS — I824Y1 Acute embolism and thrombosis of unspecified deep veins of right proximal lower extremity: Secondary | ICD-10-CM | POA: Diagnosis not present

## 2021-07-04 DIAGNOSIS — Z7901 Long term (current) use of anticoagulants: Secondary | ICD-10-CM | POA: Diagnosis not present

## 2021-07-04 DIAGNOSIS — I5023 Acute on chronic systolic (congestive) heart failure: Secondary | ICD-10-CM | POA: Diagnosis not present

## 2021-07-04 DIAGNOSIS — I5021 Acute systolic (congestive) heart failure: Secondary | ICD-10-CM

## 2021-07-04 DIAGNOSIS — Z20822 Contact with and (suspected) exposure to covid-19: Secondary | ICD-10-CM | POA: Diagnosis present

## 2021-07-04 DIAGNOSIS — D6832 Hemorrhagic disorder due to extrinsic circulating anticoagulants: Secondary | ICD-10-CM | POA: Diagnosis not present

## 2021-07-04 DIAGNOSIS — I5042 Chronic combined systolic (congestive) and diastolic (congestive) heart failure: Secondary | ICD-10-CM | POA: Diagnosis present

## 2021-07-04 DIAGNOSIS — K5790 Diverticulosis of intestine, part unspecified, without perforation or abscess without bleeding: Secondary | ICD-10-CM | POA: Diagnosis present

## 2021-07-04 DIAGNOSIS — I350 Nonrheumatic aortic (valve) stenosis: Secondary | ICD-10-CM | POA: Diagnosis present

## 2021-07-04 DIAGNOSIS — Z955 Presence of coronary angioplasty implant and graft: Secondary | ICD-10-CM

## 2021-07-04 DIAGNOSIS — H409 Unspecified glaucoma: Secondary | ICD-10-CM

## 2021-07-04 DIAGNOSIS — I82461 Acute embolism and thrombosis of right calf muscular vein: Secondary | ICD-10-CM | POA: Diagnosis not present

## 2021-07-04 DIAGNOSIS — J470 Bronchiectasis with acute lower respiratory infection: Secondary | ICD-10-CM | POA: Diagnosis present

## 2021-07-04 DIAGNOSIS — I10 Essential (primary) hypertension: Secondary | ICD-10-CM | POA: Diagnosis not present

## 2021-07-04 DIAGNOSIS — R0602 Shortness of breath: Secondary | ICD-10-CM | POA: Diagnosis not present

## 2021-07-04 DIAGNOSIS — R64 Cachexia: Secondary | ICD-10-CM | POA: Diagnosis present

## 2021-07-04 DIAGNOSIS — R0603 Acute respiratory distress: Secondary | ICD-10-CM | POA: Diagnosis not present

## 2021-07-04 DIAGNOSIS — Z79899 Other long term (current) drug therapy: Secondary | ICD-10-CM

## 2021-07-04 DIAGNOSIS — R748 Abnormal levels of other serum enzymes: Secondary | ICD-10-CM | POA: Diagnosis not present

## 2021-07-04 DIAGNOSIS — D638 Anemia in other chronic diseases classified elsewhere: Secondary | ICD-10-CM | POA: Diagnosis present

## 2021-07-04 DIAGNOSIS — Z6821 Body mass index (BMI) 21.0-21.9, adult: Secondary | ICD-10-CM

## 2021-07-04 DIAGNOSIS — N179 Acute kidney failure, unspecified: Secondary | ICD-10-CM | POA: Diagnosis present

## 2021-07-04 DIAGNOSIS — R0689 Other abnormalities of breathing: Secondary | ICD-10-CM | POA: Diagnosis not present

## 2021-07-04 DIAGNOSIS — I82401 Acute embolism and thrombosis of unspecified deep veins of right lower extremity: Secondary | ICD-10-CM | POA: Diagnosis not present

## 2021-07-04 DIAGNOSIS — K922 Gastrointestinal hemorrhage, unspecified: Secondary | ICD-10-CM | POA: Diagnosis not present

## 2021-07-04 DIAGNOSIS — R069 Unspecified abnormalities of breathing: Secondary | ICD-10-CM | POA: Diagnosis not present

## 2021-07-04 DIAGNOSIS — J9690 Respiratory failure, unspecified, unspecified whether with hypoxia or hypercapnia: Secondary | ICD-10-CM | POA: Diagnosis not present

## 2021-07-04 DIAGNOSIS — T45515A Adverse effect of anticoagulants, initial encounter: Secondary | ICD-10-CM | POA: Diagnosis not present

## 2021-07-04 DIAGNOSIS — R739 Hyperglycemia, unspecified: Secondary | ICD-10-CM | POA: Diagnosis present

## 2021-07-04 DIAGNOSIS — Z9079 Acquired absence of other genital organ(s): Secondary | ICD-10-CM

## 2021-07-04 DIAGNOSIS — D649 Anemia, unspecified: Secondary | ICD-10-CM | POA: Diagnosis not present

## 2021-07-04 DIAGNOSIS — Z8249 Family history of ischemic heart disease and other diseases of the circulatory system: Secondary | ICD-10-CM

## 2021-07-04 DIAGNOSIS — I82409 Acute embolism and thrombosis of unspecified deep veins of unspecified lower extremity: Secondary | ICD-10-CM

## 2021-07-04 DIAGNOSIS — R0600 Dyspnea, unspecified: Secondary | ICD-10-CM | POA: Diagnosis not present

## 2021-07-04 DIAGNOSIS — Z8679 Personal history of other diseases of the circulatory system: Secondary | ICD-10-CM | POA: Diagnosis not present

## 2021-07-04 DIAGNOSIS — E785 Hyperlipidemia, unspecified: Secondary | ICD-10-CM

## 2021-07-04 DIAGNOSIS — R0902 Hypoxemia: Secondary | ICD-10-CM | POA: Diagnosis not present

## 2021-07-04 DIAGNOSIS — I5022 Chronic systolic (congestive) heart failure: Secondary | ICD-10-CM | POA: Diagnosis not present

## 2021-07-04 LAB — BLOOD GAS, ARTERIAL
Acid-Base Excess: 3.1 mmol/L — ABNORMAL HIGH (ref 0.0–2.0)
Bicarbonate: 27 mmol/L (ref 20.0–28.0)
Delivery systems: POSITIVE
Drawn by: 23281
Expiratory PAP: 6 cmH2O
FIO2: 45 %
Inspiratory PAP: 14 cmH2O
O2 Saturation: 97 %
Patient temperature: 37
pCO2 arterial: 38 mmHg (ref 32–48)
pH, Arterial: 7.46 — ABNORMAL HIGH (ref 7.35–7.45)
pO2, Arterial: 71 mmHg — ABNORMAL LOW (ref 83–108)

## 2021-07-04 LAB — PROTIME-INR
INR: 1.1 (ref 0.8–1.2)
Prothrombin Time: 14.3 seconds (ref 11.4–15.2)

## 2021-07-04 LAB — CBC WITH DIFFERENTIAL/PLATELET
Abs Immature Granulocytes: 0.12 10*3/uL — ABNORMAL HIGH (ref 0.00–0.07)
Basophils Absolute: 0 10*3/uL (ref 0.0–0.1)
Basophils Relative: 0 %
Eosinophils Absolute: 0.5 10*3/uL (ref 0.0–0.5)
Eosinophils Relative: 4 %
HCT: 35.5 % — ABNORMAL LOW (ref 39.0–52.0)
Hemoglobin: 12.3 g/dL — ABNORMAL LOW (ref 13.0–17.0)
Immature Granulocytes: 1 %
Lymphocytes Relative: 10 %
Lymphs Abs: 1.4 10*3/uL (ref 0.7–4.0)
MCH: 30.1 pg (ref 26.0–34.0)
MCHC: 34.6 g/dL (ref 30.0–36.0)
MCV: 87 fL (ref 80.0–100.0)
Monocytes Absolute: 0.6 10*3/uL (ref 0.1–1.0)
Monocytes Relative: 5 %
Neutro Abs: 10.6 10*3/uL — ABNORMAL HIGH (ref 1.7–7.7)
Neutrophils Relative %: 80 %
Platelets: 506 10*3/uL — ABNORMAL HIGH (ref 150–400)
RBC: 4.08 MIL/uL — ABNORMAL LOW (ref 4.22–5.81)
RDW: 14.7 % (ref 11.5–15.5)
WBC: 13.2 10*3/uL — ABNORMAL HIGH (ref 4.0–10.5)
nRBC: 0 % (ref 0.0–0.2)

## 2021-07-04 LAB — COMPREHENSIVE METABOLIC PANEL
ALT: 73 U/L — ABNORMAL HIGH (ref 0–44)
AST: 47 U/L — ABNORMAL HIGH (ref 15–41)
Albumin: 2.6 g/dL — ABNORMAL LOW (ref 3.5–5.0)
Alkaline Phosphatase: 106 U/L (ref 38–126)
Anion gap: 9 (ref 5–15)
BUN: 18 mg/dL (ref 8–23)
CO2: 28 mmol/L (ref 22–32)
Calcium: 8.2 mg/dL — ABNORMAL LOW (ref 8.9–10.3)
Chloride: 86 mmol/L — ABNORMAL LOW (ref 98–111)
Creatinine, Ser: 1.25 mg/dL — ABNORMAL HIGH (ref 0.61–1.24)
GFR, Estimated: >60 mL/min (ref >60)
Glucose, Bld: 172 mg/dL — ABNORMAL HIGH (ref 70–99)
Potassium: 4.1 mmol/L (ref 3.5–5.1)
Sodium: 123 mmol/L — ABNORMAL LOW (ref 135–145)
Total Bilirubin: 0.9 mg/dL (ref 0.3–1.2)
Total Protein: 7.9 g/dL (ref 6.5–8.1)

## 2021-07-04 LAB — RESP PANEL BY RT-PCR (FLU A&B, COVID) ARPGX2
Influenza A by PCR: NEGATIVE
Influenza B by PCR: NEGATIVE
SARS Coronavirus 2 by RT PCR: NEGATIVE

## 2021-07-04 LAB — BRAIN NATRIURETIC PEPTIDE: B Natriuretic Peptide: 89.9 pg/mL (ref 0.0–100.0)

## 2021-07-04 LAB — TROPONIN I (HIGH SENSITIVITY): Troponin I (High Sensitivity): 8 ng/L (ref <18)

## 2021-07-04 MED ORDER — FUROSEMIDE 10 MG/ML IJ SOLN
40.0000 mg | Freq: Once | INTRAMUSCULAR | Status: AC
  Administered 2021-07-04: 40 mg via INTRAVENOUS
  Filled 2021-07-04: qty 4

## 2021-07-04 MED ORDER — FUROSEMIDE 10 MG/ML IJ SOLN
40.0000 mg | Freq: Once | INTRAMUSCULAR | Status: AC
Start: 2021-07-04 — End: 2021-07-04
  Administered 2021-07-04: 40 mg via INTRAVENOUS
  Filled 2021-07-04: qty 4

## 2021-07-04 NOTE — ED Triage Notes (Signed)
Patient BIB [facility] from home. Discharged the other day with CHF and pneumonia. When he walked around today his oxygen sats went to low 80s. Tonight patient could not catch his breath. Fire department put him on non-rebreather. Diminished lungs in all fields. No pain, nausea, vomiting.  ? ?EMS ?18G Left AC ?

## 2021-07-04 NOTE — Progress Notes (Signed)
? ?NAME:  Roberto Ponce, MRN:  [id number], DOB:  December 04, 1950, LOS: 0 ?ADMISSION DATE:  07/04/2021, CONSULTATION DATE:  07/04/21 ?REFERRING MD:  [place], ED, CHIEF COMPLAINT:  Short of breath  ? ?History of Present Illness:  ?71 yo man with hx of testicular cancer dx 04/2021 (s/p R radical orchiectomy), CAD, s/p CABG, HFrEF, MV prolapse, chronic hyponatremia (130),  ?Recently admitted from 4/20-5/10 with acute hypoxemic respiratory failure s/p CAP and/or CHF.  Was on mechanical ventilation during hospitalization.  HE was briefly treated with antibiotics and diuresed.   ?Was given 5 day course of hydrocortisone, stopped on 5/6.   ? ?Echo showed ER 40-50%.  WMA in LV, LVH mild, Grade 2 diastolic dysfunction. RVSP 58.  RV function mildly reduced. Mod MR.  ? ?Discharged on lasix 40 bid and home o2 for ambulation, 2L.   ? ?In ED was given lasix '40mg'$  IV x 2.  ? ?Patient describes that he was feeling fairly well after discharge then this evening suddenly became SOB, hypoxemic even his his home O2. No cough, no fever. Weight is actually down per patient, has been compliant with meds.   ? ?Pertinent  Medical History  ?testicular cancer dx 04/2021 (s/p R radical orchiectomy), CAD, s/p CABG, HFrEF, MV prolapse, chronic hyponatremia (130),  ? ?Home meds:  ?Asa 81, brimonidine 0.2% 1gtt L eye BID, carvedilol 3.125 BID, Plavix 75 mg, farxiga '10mg'$  daily, cosopt 1gtt in both eyes BID, zetia '10mg'$  daily, lasix 40 BID, xalatan 1gtt both eyes qhs, oxycodone '5mg'$  prn, spironolactone 12.5 daily,  ? ?Significant Hospital Events: ?Including procedures, antibiotic start and stop dates in addition to other pertinent events   ? ? ?Interim History / Subjective:  ? ? ?Objective   ?Blood pressure 108/82, pulse (!) 116, temperature 97.8 ?F (36.6 ?C), temperature source Oral, resp. rate (!) 28, height '5\' 9"'$  (1.753 m), SpO2 99 %. ?   ?   ?No intake or output data in the 24 hours ending 07/04/21 2322 ?There were no vitals filed for this  visit. ? ?Examination: ?General: Pleasant, tachypneic but WOB ok  ?HENT: NCAT ?Lungs: Crackles and Rhonchi on L  ?Cardiovascular: tachycardia, no mgr  ?Abdomen: nt, nd, nbs  ?Extremities: no edema or tenderness ?Neuro: a and o x 3  ? ? ?Resolved Hospital Problem list   ? ? ?Assessment & Plan:  ?Hypoxemic resp failure ?7.46/38/71 ?Hx not suggestive of PNA, though unilateral infiltrates actually look worse than prior.   ?Was given lasix 40iv in the ed, looks euvolemic/dry to me now.   ?Hx suggestive of PE, and he is at moderate risk given his recent hospitalization and recent cancer.  D dimer is elevated, though could be from other cause.  ?Hold off of CT for PE, given his AKI, will empirically start Lovenox treatment dosing. LE [place] for DVT study ordered.  If negative, consider CTPE when Cr improved.  Not a candidate for VQ given his parenchymal abnormalities.   ?Restarting steroids.  ?CT chest from prior hospitalization does not look consistent with CHF alone to me.  Including consolidations, some reticular changes and some appearance of traction bronchiectasis.   ?WBC stable at 13.2 ?Covid and flu negative  ? ?Hypona 123 down from BL at discharge 130 . ? ?AKI ?Cr 1.25 from 0.67  ? ?Hypoalb 2.6 stable from D/c  ? ?Best Practice (right click and "Reselect all SmartList Selections" daily)  ? ?Diet/type: clear liquids ?DVT prophylaxis: systemic dose LMWH ?GI prophylaxis: PPI ?Lines: na ?Foley:  N/A ?  Code Status:  full code ?Last date of multidisciplinary goals of care discussion [ ?] ? ?Labs   ?CBC: ?Recent Labs  ?Lab 06/28/21 ?0354 07/04/21 ?2210  ?WBC 13.2* 13.2*  ?NEUTROABS  --  10.6*  ?HGB 10.5* 12.3*  ?HCT 31.5* 35.5*  ?MCV 89.5 87.0  ?PLT 305 506*  ? ? ?Basic Metabolic Panel: ?Recent Labs  ?Lab 06/28/21 ?0354 07/04/21 ?2210  ?NA 130* 123*  ?K 4.0 4.1  ?CL 94* 86*  ?CO2 27 28  ?GLUCOSE 109* 172*  ?BUN 18 18  ?CREATININE 0.67 1.25*  ?CALCIUM 8.2* 8.2*  ? ?GFR: ?Estimated Creatinine Clearance: 55 mL/min (A) (by C-G  formula based on SCr of 1.25 mg/dL (H)). ?Recent Labs  ?Lab 06/28/21 ?0354 07/04/21 ?2210  ?PROCALCITON <0.10  --   ?WBC 13.2* 13.2*  ? ? ?Liver Function Tests: ?Recent Labs  ?Lab 06/28/21 ?0354 07/04/21 ?2210  ?AST 39 47*  ?ALT 100* 73*  ?ALKPHOS 80 106  ?BILITOT 1.0 0.9  ?PROT 6.9 7.9  ?ALBUMIN 2.6* 2.6*  ? ?No results for input(s): LIPASE, AMYLASE in the last 168 hours. ?No results for input(s): AMMONIA in the last 168 hours. ? ?ABG ?   ?Component Value Date/Time  ? PHART 7.46 (H) 07/04/2021 2220  ? PCO2ART 38 07/04/2021 2220  ? PO2ART 71 (L) 07/04/2021 2220  ? HCO3 27.0 07/04/2021 2220  ? TCO2 28 02/14/2021 1507  ? TCO2 27 02/14/2021 1507  ? ACIDBASEDEF 0.1 06/20/2021 0200  ? O2SAT 97 07/04/2021 2220  ?  ? ?Coagulation Profile: ?Recent Labs  ?Lab 07/04/21 ?2210  ?INR 1.1  ? ? ?Cardiac Enzymes: ?No results for input(s): CKTOTAL, CKMB, CKMBINDEX, TROPONINI in the last 168 hours. ? ?HbA1C: ?Hgb A1c MFr Bld  ?Date/Time Value Ref Range Status  ?06/20/2021 08:03 PM 5.4 4.8 - 5.6 % Final  ?  Comment:  ?  (NOTE) ?Pre diabetes:          5.7%-6.4% ? ?Diabetes:              >6.4% ? ?Glycemic control for   <7.0% ?adults with diabetes ?  ? ? ?CBG: ?Recent Labs  ?Lab 06/28/21 ?0718 06/28/21 ?1124  ?GLUCAP 111* 117*  ? ? ?Review of Systems:   ?Negative other than stated in HPI ? ?Past Medical History:  ?He,  has a past medical history of Asthma, Coronary artery disease, Glaucoma, History of gallstones, Hyperlipidemia, Hypotension due to medication (06/22/2021), LVH (left ventricular hypertrophy), MI (myocardial infarction) ([place]), MI, old, Mild aortic sclerosis, Mitral regurgitation, Mixed dyslipidemia, Shock ([place]) (06/22/2021), and Venous stasis.  ? ?Surgical History:  ? ?Past Surgical History:  ?Procedure Laterality Date  ? CARDIAC CATHETERIZATION  09/09/1986  ? EF 56%  ? CARDIOVASCULAR STRESS TEST  12/16/2007  ? EF 54% NO ISCHEMIA  ? CHOLECYSTECTOMY    ? CORONARY ARTERY BYPASS GRAFT    ? LIMA TO THE LAD AND RIMA TO RIGHT  CORONARY ARTERY  ? CORONARY STENT INTERVENTION N/A 02/14/2021  ? Procedure: CORONARY STENT INTERVENTION;  Surgeon: Sherren Mocha, MD;  Location: [facility];  Service: Cardiovascular;  Laterality: N/A;  ? ORCHIECTOMY Right 04/19/2021  ? Procedure: RIGHT RADICAL INGUINAL ORCHIECTOMY;  Surgeon: Lucas Mallow, MD;  Location: [facility];  Service: Urology;  Laterality: Right;  1 HR FOR CASE  ? RIGHT/LEFT HEART CATH AND CORONARY/GRAFT ANGIOGRAPHY N/A 02/14/2021  ? Procedure: RIGHT/LEFT HEART CATH AND CORONARY/GRAFT ANGIOGRAPHY;  Surgeon: Sherren Mocha, MD;  Location: [facility];  Service: Cardiovascular;  Laterality:  N/A;  ? TEE WITHOUT CARDIOVERSION N/A 09/02/2020  ? Procedure: TRANSESOPHAGEAL ECHOCARDIOGRAM (TEE);  Surgeon: Werner Lean, MD;  Location: [facility];  Service: Cardiovascular;  Laterality: N/A;  ? US ECHOCARDIOGRAPHY  12/12/2007  ? EF 55-60%  ? VASECTOMY  1988  ?  ? ?Social History:  ? reports that he has never smoked. He has never used smokeless tobacco. He reports that he does not drink alcohol and does not use drugs.  ? ?Family History:  ?His family history includes Heart attack in his father; Stroke in his father.  ? ?Allergies ?No Known Allergies  ? ?Home Medications  ?Prior to Admission medications   ?Medication Sig Start Date End Date Taking? Authorizing Provider  ?acetaminophen (TYLENOL) 500 MG tablet Take 1,000 mg by mouth every 6 (six) hours as needed for moderate pain.    [provider]  ?aspirin EC 81 MG tablet Take 1 tablet (81 mg total) by mouth daily. 06/21/14   Nahser, Wonda Cheng, MD  ?brimonidine (ALPHAGAN) 0.2 % ophthalmic solution Place 1 drop into the left eye 2 (two) times daily. 12/19/20   [provider]  ?carvedilol (COREG) 3.125 MG tablet Take 1 tablet (3.125 mg total) by mouth 2 (two) times daily with a meal. 06/28/21   Oswald Hillock, MD  ?clopidogrel (PLAVIX) 75 MG tablet Take 1 tablet (75 mg total) by mouth daily with breakfast.  02/15/21   Sande Rives E, PA-C  ?dapagliflozin propanediol (FARXIGA) 10 MG TABS tablet Take 1 tablet (10 mg total) by mouth daily. 06/29/21   Oswald Hillock, MD  ?dorzolamide-timolol (COSOPT) 22.3-6.8 MG/ML ophthalmic solutio

## 2021-07-04 NOTE — ED Provider Notes (Addendum)
?[facility] DEPT ?Provider Note ? ? ?CSN: [id number] ?Arrival date & time: 07/04/21  2159 ? ?  ? ?History ? ?Chief Complaint  ?Patient presents with  ? Shortness of Breath  ? ? ?Roberto Ponce is a 71 y.o. male. ? ?Level 5 caveat of respiratory distress.  Patient brought in by EMS with difficulty breathing that onset today after eating dinner.  Patient was recently in the hospital last week for respiratory failure thought secondary to CHF and possibly pneumonia.  He was mechanically ventilated for 2 days.  He states he was breathing well until this evening when he suddenly got short of breath again and could not catch his breath.  His O2 saturation was in the 80s on EMS arrival.  Diminished breath sounds in all fields.  Denies chest pain.  No nausea or vomiting.  Clear productive cough but no fever.  No leg swelling.  States compliance with his medications.  Believes he completed antibiotics but is not sure ? ?The history is provided by the patient and the EMS personnel.  ?Shortness of Breath ? ?  ? ?Home Medications ?Prior to Admission medications   ?Medication Sig Start Date End Date Taking? Authorizing Provider  ?acetaminophen (TYLENOL) 500 MG tablet Take 1,000 mg by mouth every 6 (six) hours as needed for moderate pain.    [provider]  ?aspirin EC 81 MG tablet Take 1 tablet (81 mg total) by mouth daily. 06/21/14   Nahser, Wonda Cheng, MD  ?brimonidine (ALPHAGAN) 0.2 % ophthalmic solution Place 1 drop into the left eye 2 (two) times daily. 12/19/20   [provider]  ?carvedilol (COREG) 3.125 MG tablet Take 1 tablet (3.125 mg total) by mouth 2 (two) times daily with a meal. 06/28/21   Oswald Hillock, MD  ?clopidogrel (PLAVIX) 75 MG tablet Take 1 tablet (75 mg total) by mouth daily with breakfast. 02/15/21   Sande Rives E, PA-C  ?dapagliflozin propanediol (FARXIGA) 10 MG TABS tablet Take 1 tablet (10 mg total) by mouth daily. 06/29/21   Oswald Hillock, MD   ?dorzolamide-timolol (COSOPT) 22.3-6.8 MG/ML ophthalmic solution Place 1 drop into both eyes 2 (two) times daily.    [provider]  ?ezetimibe (ZETIA) 10 MG tablet TAKE 1 TABLET(10 MG) BY MOUTH DAILY ?Patient taking differently: Take 10 mg by mouth at bedtime. 04/16/19   Nahser, Wonda Cheng, MD  ?furosemide (LASIX) 40 MG tablet Take 1 tablet (40 mg total) by mouth 2 (two) times daily. 06/28/21 10/26/21  Oswald Hillock, MD  ?latanoprost (XALATAN) 0.005 % ophthalmic solution Place 1 drop into both eyes at bedtime.  05/27/13   [provider]  ?nitroGLYCERIN (NITROSTAT) 0.4 MG SL tablet Place 1 tablet (0.4 mg total) under the tongue every 5 (five) minutes as needed for chest pain. 02/15/21 02/15/22  Darreld Mclean, PA-C  ?oxycodone (OXY-IR) 5 MG capsule Take 1 capsule (5 mg total) by mouth every 4 (four) hours as needed. 04/19/21   Marton Redwood III, MD  ?SODIUM FLUORIDE 5000 PPM 1.1 % PSTE Apply 1 application. topically in the morning and at bedtime. 05/17/21   [provider]  ?spironolactone (ALDACTONE) 25 MG tablet Take 0.5 tablets (12.5 mg total) by mouth daily. 06/29/21   Oswald Hillock, MD  ?triamcinolone cream (KENALOG) 0.1 % Apply 1 application topically 2 (two) times daily as needed (eczema).    [provider]  ?   ? ?Allergies    ?Patient has no known  allergies.   ? ?Review of Systems   ?Review of Systems  ?Unable to perform ROS: Severe respiratory distress  ?Respiratory:  Positive for shortness of breath.   ? ?Physical Exam ?Updated Vital Signs ?BP (!) 144/83 (BP Location: Right Arm)   Pulse (!) 109   Temp 97.8 ?F (36.6 ?C) (Oral)   Resp (!) 28   SpO2 94%  ?Physical Exam ?Vitals and nursing note reviewed.  ?Constitutional:   ?   General: He is in acute distress.  ?   Appearance: He is well-developed. He is ill-appearing.  ?   Comments: Respiratory distress with diminished breath sounds bilaterally, tachypnea in the 40s  ?HENT:  ?   Head: Normocephalic and atraumatic.  ?    Mouth/Throat:  ?   Pharynx: No oropharyngeal exudate.  ?Eyes:  ?   Conjunctiva/sclera: Conjunctivae normal.  ?   Pupils: Pupils are equal, round, and reactive to light.  ?Neck:  ?   Comments: No meningismus. ?Cardiovascular:  ?   Rate and Rhythm: Normal rate and regular rhythm.  ?   Heart sounds: Normal heart sounds. No murmur heard. ?Pulmonary:  ?   Effort: Respiratory distress present.  ?   Breath sounds: Normal breath sounds.  ?   Comments: Tachypnea, diminished breath sounds bilaterally ?Abdominal:  ?   Palpations: Abdomen is soft.  ?   Tenderness: There is no abdominal tenderness. There is no guarding or rebound.  ?Musculoskeletal:     ?   General: No tenderness. Normal range of motion.  ?   Cervical back: Normal range of motion and neck supple.  ?Skin: ?   General: Skin is warm.  ?Neurological:  ?   Mental Status: He is alert and oriented to person, place, and time.  ?   Cranial Nerves: No cranial nerve deficit.  ?   Motor: No abnormal muscle tone.  ?   Coordination: Coordination normal.  ?   Comments:  5/5 strength throughout. CN 2-12 intact.Equal grip strength.   ?Psychiatric:     ?   Behavior: Behavior normal.  ? ? ?ED Results / Procedures / Treatments   ?Labs ?(all labs ordered are listed, but only abnormal results are displayed) ?Labs Reviewed  ?BLOOD GAS, ARTERIAL - Abnormal; Notable for the following components:  ?    Result Value  ? pH, Arterial 7.46 (*)   ? pO2, Arterial 71 (*)   ? Acid-Base Excess 3.1 (*)   ? All other components within normal limits  ?CBC WITH DIFFERENTIAL/PLATELET - Abnormal; Notable for the following components:  ? WBC 13.2 (*)   ? RBC 4.08 (*)   ? Hemoglobin 12.3 (*)   ? HCT 35.5 (*)   ? Platelets 506 (*)   ? Neutro Abs 10.6 (*)   ? Abs Immature Granulocytes 0.12 (*)   ? All other components within normal limits  ?COMPREHENSIVE METABOLIC PANEL - Abnormal; Notable for the following components:  ? Sodium 123 (*)   ? Chloride 86 (*)   ? Glucose, Bld 172 (*)   ? Creatinine, Ser 1.25  (*)   ? Calcium 8.2 (*)   ? Albumin 2.6 (*)   ? AST 47 (*)   ? ALT 73 (*)   ? All other components within normal limits  ?RESP PANEL BY RT-PCR (FLU A&B, COVID) [id number]  ?BRAIN NATRIURETIC PEPTIDE  ?PROTIME-INR  ?TROPONIN I (HIGH SENSITIVITY)  ? ? ?EKG ?EKG Interpretation ? ?Date/Time:  Tuesday Jul 04 2021 22:12:15 EDT ?Ventricular Rate:  106 ?  PR Interval:  94 ?QRS Duration: 91 ?QT Interval:  312 ?QTC Calculation: 415 ?R Axis:   51 ?Text Interpretation: Sinus tachycardia Probable left atrial enlargement Inferior infarct, old Rate faster Confirmed by Ezequiel Essex [phone]) on 07/04/2021 10:28:57 PM ? ?Radiology ?DG Chest Portable 1 View ? ?Result Date: 07/04/2021 ?CLINICAL DATA:  Short of breath, hypoxia EXAM: PORTABLE CHEST 1 VIEW COMPARISON:  06/26/2021 FINDINGS: Single frontal view of the chest demonstrates stable postsurgical changes from median sternotomy. Cardiac silhouette is unremarkable. There is progressive asymmetric bilateral airspace disease, left greater than right. Small bilateral effusions are suspected. No pneumothorax. No acute bony abnormalities. IMPRESSION: 1. Progressive asymmetric bilateral airspace disease, left greater than right. Findings could reflect infection or asymmetric edema. Electronically Signed   By: Randa Ngo M.D.   On: 07/04/2021 22:36   ? ?Procedures ?Marland KitchenCritical Care ?Performed by: Ezequiel Essex, MD ?Authorized by: Ezequiel Essex, MD  ? ?Critical care provider statement:  ?  Critical care time (minutes):  45 ?  Critical care time was exclusive of:  Separately billable procedures and treating other patients ?  Critical care was necessary to treat or prevent imminent or life-threatening deterioration of the following conditions:  Respiratory failure ?  Critical care was time spent personally by me on the following activities:  Development of treatment plan with patient or surrogate, discussions with consultants, evaluation of patient's response to treatment, examination of  patient, ordering and review of laboratory studies, ordering and review of radiographic studies, ordering and performing treatments and interventions, pulse oximetry, re-evaluation of patient's condition and revie

## 2021-07-05 ENCOUNTER — Inpatient Hospital Stay (HOSPITAL_COMMUNITY): Payer: PPO

## 2021-07-05 ENCOUNTER — Telehealth: Payer: Self-pay | Admitting: Internal Medicine

## 2021-07-05 ENCOUNTER — Encounter (HOSPITAL_COMMUNITY): Payer: Self-pay | Admitting: Pulmonary Disease

## 2021-07-05 DIAGNOSIS — J9690 Respiratory failure, unspecified, unspecified whether with hypoxia or hypercapnia: Secondary | ICD-10-CM | POA: Diagnosis not present

## 2021-07-05 DIAGNOSIS — I340 Nonrheumatic mitral (valve) insufficiency: Secondary | ICD-10-CM | POA: Diagnosis not present

## 2021-07-05 DIAGNOSIS — R0603 Acute respiratory distress: Secondary | ICD-10-CM | POA: Diagnosis not present

## 2021-07-05 DIAGNOSIS — D638 Anemia in other chronic diseases classified elsewhere: Secondary | ICD-10-CM | POA: Diagnosis present

## 2021-07-05 DIAGNOSIS — Z8639 Personal history of other endocrine, nutritional and metabolic disease: Secondary | ICD-10-CM | POA: Diagnosis not present

## 2021-07-05 DIAGNOSIS — R0600 Dyspnea, unspecified: Secondary | ICD-10-CM | POA: Diagnosis not present

## 2021-07-05 DIAGNOSIS — D649 Anemia, unspecified: Secondary | ICD-10-CM | POA: Diagnosis not present

## 2021-07-05 DIAGNOSIS — K5790 Diverticulosis of intestine, part unspecified, without perforation or abscess without bleeding: Secondary | ICD-10-CM | POA: Diagnosis present

## 2021-07-05 DIAGNOSIS — N179 Acute kidney failure, unspecified: Secondary | ICD-10-CM | POA: Diagnosis present

## 2021-07-05 DIAGNOSIS — Z951 Presence of aortocoronary bypass graft: Secondary | ICD-10-CM | POA: Diagnosis not present

## 2021-07-05 DIAGNOSIS — J939 Pneumothorax, unspecified: Secondary | ICD-10-CM | POA: Diagnosis present

## 2021-07-05 DIAGNOSIS — I82431 Acute embolism and thrombosis of right popliteal vein: Secondary | ICD-10-CM | POA: Diagnosis present

## 2021-07-05 DIAGNOSIS — Z0389 Encounter for observation for other suspected diseases and conditions ruled out: Secondary | ICD-10-CM | POA: Diagnosis not present

## 2021-07-05 DIAGNOSIS — J9621 Acute and chronic respiratory failure with hypoxia: Secondary | ICD-10-CM | POA: Diagnosis present

## 2021-07-05 DIAGNOSIS — Z20822 Contact with and (suspected) exposure to covid-19: Secondary | ICD-10-CM | POA: Diagnosis present

## 2021-07-05 DIAGNOSIS — I5022 Chronic systolic (congestive) heart failure: Secondary | ICD-10-CM | POA: Diagnosis not present

## 2021-07-05 DIAGNOSIS — T380X5A Adverse effect of glucocorticoids and synthetic analogues, initial encounter: Secondary | ICD-10-CM | POA: Diagnosis present

## 2021-07-05 DIAGNOSIS — R918 Other nonspecific abnormal finding of lung field: Secondary | ICD-10-CM | POA: Diagnosis not present

## 2021-07-05 DIAGNOSIS — J810 Acute pulmonary edema: Secondary | ICD-10-CM | POA: Diagnosis present

## 2021-07-05 DIAGNOSIS — Z8547 Personal history of malignant neoplasm of testis: Secondary | ICD-10-CM | POA: Diagnosis not present

## 2021-07-05 DIAGNOSIS — I5042 Chronic combined systolic (congestive) and diastolic (congestive) heart failure: Secondary | ICD-10-CM | POA: Diagnosis present

## 2021-07-05 DIAGNOSIS — I5023 Acute on chronic systolic (congestive) heart failure: Secondary | ICD-10-CM | POA: Diagnosis not present

## 2021-07-05 DIAGNOSIS — E871 Hypo-osmolality and hyponatremia: Secondary | ICD-10-CM | POA: Diagnosis not present

## 2021-07-05 DIAGNOSIS — I350 Nonrheumatic aortic (valve) stenosis: Secondary | ICD-10-CM | POA: Diagnosis present

## 2021-07-05 DIAGNOSIS — Z7901 Long term (current) use of anticoagulants: Secondary | ICD-10-CM | POA: Diagnosis not present

## 2021-07-05 DIAGNOSIS — K921 Melena: Secondary | ICD-10-CM | POA: Diagnosis not present

## 2021-07-05 DIAGNOSIS — J9601 Acute respiratory failure with hypoxia: Secondary | ICD-10-CM

## 2021-07-05 DIAGNOSIS — K922 Gastrointestinal hemorrhage, unspecified: Secondary | ICD-10-CM | POA: Diagnosis not present

## 2021-07-05 DIAGNOSIS — I82401 Acute embolism and thrombosis of unspecified deep veins of right lower extremity: Secondary | ICD-10-CM | POA: Diagnosis not present

## 2021-07-05 DIAGNOSIS — R64 Cachexia: Secondary | ICD-10-CM | POA: Diagnosis present

## 2021-07-05 DIAGNOSIS — R0602 Shortness of breath: Secondary | ICD-10-CM | POA: Diagnosis not present

## 2021-07-05 DIAGNOSIS — I82441 Acute embolism and thrombosis of right tibial vein: Secondary | ICD-10-CM | POA: Diagnosis present

## 2021-07-05 DIAGNOSIS — E782 Mixed hyperlipidemia: Secondary | ICD-10-CM | POA: Diagnosis present

## 2021-07-05 DIAGNOSIS — E222 Syndrome of inappropriate secretion of antidiuretic hormone: Secondary | ICD-10-CM | POA: Diagnosis present

## 2021-07-05 DIAGNOSIS — Z8679 Personal history of other diseases of the circulatory system: Secondary | ICD-10-CM | POA: Diagnosis not present

## 2021-07-05 DIAGNOSIS — I824Y1 Acute embolism and thrombosis of unspecified deep veins of right proximal lower extremity: Secondary | ICD-10-CM | POA: Diagnosis not present

## 2021-07-05 DIAGNOSIS — J982 Interstitial emphysema: Secondary | ICD-10-CM | POA: Diagnosis present

## 2021-07-05 DIAGNOSIS — D6832 Hemorrhagic disorder due to extrinsic circulating anticoagulants: Secondary | ICD-10-CM | POA: Diagnosis not present

## 2021-07-05 DIAGNOSIS — I2510 Atherosclerotic heart disease of native coronary artery without angina pectoris: Secondary | ICD-10-CM | POA: Diagnosis present

## 2021-07-05 DIAGNOSIS — Z8701 Personal history of pneumonia (recurrent): Secondary | ICD-10-CM | POA: Diagnosis not present

## 2021-07-05 DIAGNOSIS — I252 Old myocardial infarction: Secondary | ICD-10-CM | POA: Diagnosis not present

## 2021-07-05 DIAGNOSIS — J470 Bronchiectasis with acute lower respiratory infection: Secondary | ICD-10-CM | POA: Diagnosis present

## 2021-07-05 DIAGNOSIS — I82461 Acute embolism and thrombosis of right calf muscular vein: Secondary | ICD-10-CM | POA: Diagnosis not present

## 2021-07-05 DIAGNOSIS — J8489 Other specified interstitial pulmonary diseases: Secondary | ICD-10-CM | POA: Diagnosis not present

## 2021-07-05 DIAGNOSIS — R748 Abnormal levels of other serum enzymes: Secondary | ICD-10-CM | POA: Diagnosis not present

## 2021-07-05 DIAGNOSIS — J45909 Unspecified asthma, uncomplicated: Secondary | ICD-10-CM | POA: Diagnosis present

## 2021-07-05 DIAGNOSIS — H409 Unspecified glaucoma: Secondary | ICD-10-CM | POA: Diagnosis present

## 2021-07-05 LAB — ECHOCARDIOGRAM COMPLETE
AR max vel: 2.36 cm2
AV Area VTI: 2.55 cm2
AV Area mean vel: 2 cm2
AV Mean grad: 3 mmHg
AV Peak grad: 6.7 mmHg
Ao pk vel: 1.29 m/s
Area-P 1/2: 5.13 cm2
Calc EF: 37.5 %
Height: 69 in
MV M vel: 4.55 m/s
MV Peak grad: 82.6 mmHg
Radius: 0.3 cm
S' Lateral: 3.5 cm
Single Plane A2C EF: 37.1 %
Single Plane A4C EF: 32.7 %
Weight: 2331.58 oz

## 2021-07-05 LAB — URINALYSIS, ROUTINE W REFLEX MICROSCOPIC
Bilirubin Urine: NEGATIVE
Glucose, UA: 50 mg/dL — AB
Ketones, ur: NEGATIVE mg/dL
Leukocytes,Ua: NEGATIVE
Nitrite: NEGATIVE
Protein, ur: NEGATIVE mg/dL
Specific Gravity, Urine: 1.005 (ref 1.005–1.030)
pH: 7 (ref 5.0–8.0)

## 2021-07-05 LAB — D-DIMER, QUANTITATIVE: D-Dimer, Quant: >20 ug/mL-FEU — ABNORMAL HIGH (ref 0.00–0.50)

## 2021-07-05 LAB — STREP PNEUMONIAE URINARY ANTIGEN: Strep Pneumo Urinary Antigen: NEGATIVE

## 2021-07-05 LAB — TROPONIN I (HIGH SENSITIVITY): Troponin I (High Sensitivity): 10 ng/L (ref <18)

## 2021-07-05 LAB — PROCALCITONIN
Procalcitonin: <0.1 ng/mL
Procalcitonin: <0.1 ng/mL

## 2021-07-05 MED ORDER — POLYETHYLENE GLYCOL 3350 17 G PO PACK: 17.0000 g | PACK | Freq: Every day | ORAL | Status: DC | PRN

## 2021-07-05 MED ORDER — ORAL CARE MOUTH RINSE
15.0000 mL | Freq: Two times a day (BID) | OROMUCOSAL | Status: DC
  Administered 2021-07-05: 15 mL via OROMUCOSAL

## 2021-07-05 MED ORDER — SPIRONOLACTONE 12.5 MG HALF TABLET
12.5000 mg | ORAL_TABLET | Freq: Every day | ORAL | Status: DC
  Administered 2021-07-06 – 2021-07-09 (×4): 12.5 mg via ORAL
  Filled 2021-07-05 (×4): qty 1

## 2021-07-05 MED ORDER — CHLORHEXIDINE GLUCONATE CLOTH 2 % EX PADS
6.0000 | MEDICATED_PAD | Freq: Every day | CUTANEOUS | Status: DC
  Administered 2021-07-05 – 2021-07-06 (×2): 6 via TOPICAL

## 2021-07-05 MED ORDER — BRIMONIDINE TARTRATE 0.2 % OP SOLN
1.0000 [drp] | Freq: Two times a day (BID) | OPHTHALMIC | Status: DC
  Administered 2021-07-05 – 2021-07-12 (×16): 1 [drp] via OPHTHALMIC
  Filled 2021-07-05: qty 5

## 2021-07-05 MED ORDER — ASPIRIN 81 MG PO TBEC
81.0000 mg | DELAYED_RELEASE_TABLET | Freq: Every day | ORAL | Status: DC
  Administered 2021-07-05 – 2021-07-06 (×2): 81 mg via ORAL
  Filled 2021-07-05 (×2): qty 1

## 2021-07-05 MED ORDER — DAPAGLIFLOZIN PROPANEDIOL 10 MG PO TABS
10.0000 mg | ORAL_TABLET | Freq: Every day | ORAL | Status: DC
  Administered 2021-07-05 – 2021-07-12 (×8): 10 mg via ORAL
  Filled 2021-07-05 (×9): qty 1

## 2021-07-05 MED ORDER — LATANOPROST 0.005 % OP SOLN
1.0000 [drp] | Freq: Every day | OPHTHALMIC | Status: DC
  Administered 2021-07-05 – 2021-07-11 (×8): 1 [drp] via OPHTHALMIC
  Filled 2021-07-05: qty 2.5

## 2021-07-05 MED ORDER — HEPARIN BOLUS VIA INFUSION
4000.0000 [IU] | Freq: Once | INTRAVENOUS | Status: DC
  Filled 2021-07-05: qty 4000

## 2021-07-05 MED ORDER — CARVEDILOL 3.125 MG PO TABS
3.1250 mg | ORAL_TABLET | Freq: Two times a day (BID) | ORAL | Status: DC
  Administered 2021-07-05 – 2021-07-12 (×15): 3.125 mg via ORAL
  Filled 2021-07-05 (×15): qty 1

## 2021-07-05 MED ORDER — ENOXAPARIN SODIUM 80 MG/0.8ML IJ SOSY
1.0000 mg/kg | PREFILLED_SYRINGE | Freq: Two times a day (BID) | INTRAMUSCULAR | Status: DC
  Administered 2021-07-05 – 2021-07-06 (×3): 75 mg via SUBCUTANEOUS
  Filled 2021-07-05 (×4): qty 0.75

## 2021-07-05 MED ORDER — EZETIMIBE 10 MG PO TABS
10.0000 mg | ORAL_TABLET | Freq: Every day | ORAL | Status: DC
  Administered 2021-07-05 – 2021-07-12 (×8): 10 mg via ORAL
  Filled 2021-07-05 (×8): qty 1

## 2021-07-05 MED ORDER — ONDANSETRON HCL 4 MG/2ML IJ SOLN: 4.0000 mg | Freq: Four times a day (QID) | INTRAMUSCULAR | Status: DC | PRN

## 2021-07-05 MED ORDER — ORAL CARE MOUTH RINSE: 15.0000 mL | Freq: Two times a day (BID) | OROMUCOSAL | Status: DC

## 2021-07-05 MED ORDER — METHYLPREDNISOLONE SODIUM SUCC 125 MG IJ SOLR
80.0000 mg | Freq: Two times a day (BID) | INTRAMUSCULAR | Status: DC
  Administered 2021-07-05 – 2021-07-09 (×9): 80 mg via INTRAVENOUS
  Filled 2021-07-05 (×9): qty 2

## 2021-07-05 MED ORDER — CLOPIDOGREL BISULFATE 75 MG PO TABS
75.0000 mg | ORAL_TABLET | Freq: Every day | ORAL | Status: DC
Start: 2021-07-05 — End: 2021-07-09
  Administered 2021-07-05 – 2021-07-08 (×4): 75 mg via ORAL
  Filled 2021-07-05 (×5): qty 1

## 2021-07-05 MED ORDER — IPRATROPIUM-ALBUTEROL 0.5-2.5 (3) MG/3ML IN SOLN: 3.0000 mL | Freq: Four times a day (QID) | RESPIRATORY_TRACT | Status: DC | PRN

## 2021-07-05 MED ORDER — DORZOLAMIDE HCL-TIMOLOL MAL 2-0.5 % OP SOLN
1.0000 [drp] | Freq: Two times a day (BID) | OPHTHALMIC | Status: DC
  Administered 2021-07-05 – 2021-07-12 (×16): 1 [drp] via OPHTHALMIC
  Filled 2021-07-05: qty 10

## 2021-07-05 MED ORDER — SODIUM CHLORIDE (PF) 0.9 % IJ SOLN
INTRAMUSCULAR | Status: AC
  Filled 2021-07-05: qty 50

## 2021-07-05 MED ORDER — IOHEXOL 350 MG/ML SOLN
80.0000 mL | Freq: Once | INTRAVENOUS | Status: AC | PRN
  Administered 2021-07-10: 65 mL via INTRAVENOUS

## 2021-07-05 MED ORDER — CHLORHEXIDINE GLUCONATE 0.12 % MT SOLN
15.0000 mL | Freq: Two times a day (BID) | OROMUCOSAL | Status: DC
  Administered 2021-07-05 (×3): 15 mL via OROMUCOSAL
  Filled 2021-07-05 (×3): qty 15

## 2021-07-05 MED ORDER — DOCUSATE SODIUM 100 MG PO CAPS
100.0000 mg | ORAL_CAPSULE | Freq: Two times a day (BID) | ORAL | Status: DC | PRN
  Administered 2021-07-05: 100 mg via ORAL
  Filled 2021-07-05: qty 1

## 2021-07-05 MED ORDER — CHLORHEXIDINE GLUCONATE 0.12 % MT SOLN
15.0000 mL | Freq: Two times a day (BID) | OROMUCOSAL | Status: DC
  Administered 2021-07-05 – 2021-07-12 (×14): 15 mL via OROMUCOSAL
  Filled 2021-07-05 (×13): qty 15

## 2021-07-05 MED ORDER — FUROSEMIDE 20 MG PO TABS
20.0000 mg | ORAL_TABLET | Freq: Every day | ORAL | Status: DC
  Administered 2021-07-05 – 2021-07-09 (×5): 20 mg via ORAL
  Filled 2021-07-05 (×5): qty 1

## 2021-07-05 MED ORDER — HEPARIN (PORCINE) 25000 UT/250ML-% IV SOLN
1200.0000 [IU]/h | INTRAVENOUS | Status: DC
  Filled 2021-07-05: qty 250

## 2021-07-05 NOTE — Progress Notes (Signed)
As per ED RN, no need for CT angio and order will be cancelled by Marchelle Gearing MD per their conversation.  ?

## 2021-07-05 NOTE — Progress Notes (Signed)
Pt off BIPAP at this time. No complications at this time.  ?

## 2021-07-05 NOTE — Plan of Care (Signed)

## 2021-07-05 NOTE — Telephone Encounter (Signed)
Patient's daughter called to let Dr. Gasper Sells know that her father is back in the hospital at [facility] ICU with fluid in his lungs.  ?

## 2021-07-05 NOTE — Progress Notes (Signed)
Pt transported on bipap from ED to icu room 1227.  Pt tolerated transport well without incident. ?

## 2021-07-05 NOTE — Progress Notes (Signed)
BLE venous duplex has been completed.  Preliminary findings given to Jonelle Sidle, Therapist, sports. ? ? ?Results can be found under chart review under CV PROC. ?07/05/2021 2:08 PM ?Kathia Covington RVT, RDMS ? ?

## 2021-07-05 NOTE — H&P (Signed)
? ?NAME:  Roberto Ponce, MRN:  [id number], DOB:  Oct 19, 1950, LOS: 0 ?ADMISSION DATE:  07/04/2021, CONSULTATION DATE:  07/04/2021 ?REFERRING MD:  Dr. Wyvonnia Dusky, ED, CHIEF COMPLAINT:  Short of breath  ? 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  In evening of 07/04/21 he developed sudden onset of dyspnea associated with hypoxia with SpO2 in low 80s on room air.  He presented to ER and required Bipap. ? ?Pertinent  Medical History  ?CAD, MVP with mod/severe MR, Testicular cancer s/p orchiectomy, Glaucoma, HLD ? ?Significant Hospital Events: ?Including procedures, antibiotic start and stop dates in addition to other pertinent events   ?5/16 Admit ? ?Interim History / Subjective:  ?Transitioned off Bipap this morning.  Has dry cough.  Denies chest pain.  Wants something to eat. ? ?Objective   ?Blood pressure (!) 100/53, pulse 79, temperature (!) 96.9 ?F (36.1 ?C), temperature source Axillary, resp. rate 20, height '5\' 9"'$  (1.753 m), weight 66.1 kg, SpO2 99 %. ?   ?FiO2 (%):  [45 %] 45 %  ? ?Intake/Output Summary (Last 24 hours) at 07/05/2021 0929 ?Last data filed at 07/05/2021 0600 ?Gross per 24 hour  ?Intake --  ?Output 450 ml  ?Net -450 ml  ? ?Filed Weights  ? 07/05/21 0140 07/05/21 0300  ?Weight: 66.1 kg 66.1 kg  ? ? ?Examination: ? ?General - alert ?Eyes - pupils reactive ?ENT - no sinus tenderness, no stridor ?Cardiac - regular rate/rhythm, no murmur ?Chest - crackles Lt >> Rt ?Abdomen - soft, non tender, + bowel sounds ?Extremities - no cyanosis, clubbing, or edema ?Skin - no rashes ?Neuro - normal strength, moves extremities, follows commands ?Psych - normal mood and behavior ? ? ?Resolved Hospital Problem list   ? ?Assessment & Plan:  ? ?Acute hypoxic respiratory failure. ?- likely combination of acute pulmonary edema and recurrence of post-viral BOOP ?-  continue solumedrol 80 mg q12h ?- goal SpO2 > 92% ?- Bipap prn ?- f/u CXR ? ?Coronary artery disease s/p CABG, mod/severe MR, Acute on chronic systolic CHF, HLD. ?- followed by Dr.  Rudean Haskell ?- f/u Echo ?- continue ASA, plavix, coreg, farxiga, zetia, aldactone, lasix ?- if doppler legs negative, then transition to DVT prophylaxis dose of lovenox ? ?Hyponatremia from SIADH. ?- fluid restrict ?- f/u BMET ? ?Glaucoma. ?- continue eye drops ? ?Steroid induced hyperglycemia. ?- SSI ? ?Hx of testicular cancer. ?- follow up with oncology and urology as outpt ? ?Best Practice (right click and "Reselect all SmartList Selections" daily)  ? ?Diet/type: Regular consistency (see orders) ?DVT prophylaxis: systemic dose LMWH ?GI prophylaxis: N/A ?Lines: N/A ?Foley:  N/A ?Code Status:  full code ?Last date of multidisciplinary goals of care discussion [updated pt's wife at bedside] ? ?Labs   ?CBC: ?Recent Labs  ?Lab 07/04/21 ?2210  ?WBC 13.2*  ?NEUTROABS 10.6*  ?HGB 12.3*  ?HCT 35.5*  ?MCV 87.0  ?PLT 506*  ? ? ?Basic Metabolic Panel: ?Recent Labs  ?Lab 07/04/21 ?2210  ?NA 123*  ?K 4.1  ?CL 86*  ?CO2 28  ?GLUCOSE 172*  ?BUN 18  ?CREATININE 1.25*  ?CALCIUM 8.2*  ? ?GFR: ?Estimated Creatinine Clearance: 51.4 mL/min (A) (by C-G formula based on SCr of 1.25 mg/dL (H)). ?Recent Labs  ?Lab 07/04/21 ?2210 07/04/21 ?2350 07/05/21 ?0250  ?PROCALCITON  --  <0.10 <0.10  ?WBC 13.2*  --   --   ? ? ?  Liver Function Tests: ?Recent Labs  ?Lab 07/04/21 ?2210  ?AST 47*  ?ALT 73*  ?ALKPHOS 106  ?BILITOT 0.9  ?PROT 7.9  ?ALBUMIN 2.6*  ? ?No results for input(s): LIPASE, AMYLASE in the last 168 hours. ?No results for input(s): AMMONIA in the last 168 hours. ? ?ABG ?   ?Component Value Date/Time  ? PHART 7.46 (H) 07/04/2021 2220  ? PCO2ART 38 07/04/2021 2220  ? PO2ART 71 (L) 07/04/2021 2220  ? HCO3 27.0 07/04/2021 2220  ? TCO2 28 02/14/2021 1507  ? TCO2 27 02/14/2021 1507  ? ACIDBASEDEF 0.1 06/20/2021 0200  ? O2SAT 97 07/04/2021 2220  ?   ? ?Coagulation Profile: ?Recent Labs  ?Lab 07/04/21 ?2210  ?INR 1.1  ? ? ?Cardiac Enzymes: ?No results for input(s): CKTOTAL, CKMB, CKMBINDEX, TROPONINI in the last 168 hours. ? ?HbA1C: ?Hgb A1c MFr Bld  ?Date/Time Value Ref Range Status  ?06/20/2021 08:03 PM 5.4 4.8 - 5.6 % Final  ?  Comment:  ?  (NOTE) ?Pre diabetes:          5.7%-6.4% ? ?Diabetes:              >6.4% ? ?Glycemic control for   <7.0% ?adults with diabetes ?  ? ? ?CBG: ?Recent Labs  ?Lab 06/28/21 ?1124  ?GLUCAP 117*  ? ? ?Review of Systems:   ?Reviewed and negative ? ?Past Medical History:  ?He,  has a past medical history of Asthma, Coronary artery disease, Glaucoma, History of gallstones, Hyperlipidemia, Hypotension due to medication (06/22/2021), LVH (left ventricular hypertrophy), MI (myocardial infarction) ([place]), MI, old, Mild aortic sclerosis, Mitral regurgitation, Mixed dyslipidemia, Shock ([place]) (06/22/2021), and Venous stasis.  ? ?Surgical History:  ? ?Past Surgical History:  ?Procedure Laterality Date  ? CARDIAC CATHETERIZATION  09/09/1986  ? EF 56%  ? CARDIOVASCULAR STRESS TEST  12/16/2007  ? EF 54% NO ISCHEMIA  ? CHOLECYSTECTOMY    ? CORONARY ARTERY BYPASS GRAFT    ? LIMA TO THE LAD AND RIMA TO RIGHT CORONARY ARTERY  ? CORONARY STENT INTERVENTION N/A 02/14/2021  ? Procedure: CORONARY STENT INTERVENTION;  Surgeon: Sherren Mocha, MD;  Location: [facility] CV LAB;  Service: Cardiovascular;  Laterality: N/A;  ? ORCHIECTOMY Right 04/19/2021  ? Procedure: RIGHT RADICAL INGUINAL ORCHIECTOMY;  Surgeon: Lucas Mallow, MD;  Location: [facility] ORS;  Service: Urology;  Laterality: Right;  1 HR FOR CASE  ? RIGHT/LEFT HEART CATH AND CORONARY/GRAFT ANGIOGRAPHY N/A 02/14/2021  ? Procedure: RIGHT/LEFT HEART CATH AND CORONARY/GRAFT ANGIOGRAPHY;  Surgeon: Sherren Mocha, MD;  Location: [facility] CV LAB;  Service: Cardiovascular;  Laterality: N/A;  ? TEE WITHOUT CARDIOVERSION N/A 09/02/2020  ? Procedure: TRANSESOPHAGEAL ECHOCARDIOGRAM (TEE);  Surgeon:  Werner Lean, MD;  Location: [facility] ENDOSCOPY;  Service: Cardiovascular;  Laterality: N/A;  ? US ECHOCARDIOGRAPHY  12/12/2007  ? EF 55-60%  ? VASECTOMY  1988  ?  ? ?Social History:  ? reports that he has never smoked. He has never used smokeless tobacco. He reports that he does not drink alcohol and does not use drugs.  ? ?Family History:  ?His family history includes Heart attack in his father; Stroke in his father.  ? ?Allergies ?No Known Allergies  ? ?Home Medications  ?Prior to Admission medications   ?Medication Sig Start Date End Date Taking? Authorizing Provider  ?acetaminophen (TYLENOL) 500 MG tablet Take 1,000 mg by mouth every 6 (six) hours as needed for moderate pain.   Yes [provider]  ?aspirin EC 81 MG tablet  Take 1 tablet (81 mg total) by mouth daily. 06/21/14  Yes Nahser, Wonda Cheng, MD  ?brimonidine (ALPHAGAN) 0.2 % ophthalmic solution Place 1 drop into the left eye 2 (two) times daily. 12/19/20  Yes [provider]  ?carvedilol (COREG) 3.125 MG tablet Take 1 tablet (3.125 mg total) by mouth 2 (two) times daily with a meal. 06/28/21  Yes Darrick Meigs, Marge Duncans, MD  ?clopidogrel (PLAVIX) 75 MG tablet Take 1 tablet (75 mg total) by mouth daily with breakfast. 02/15/21  Yes Sande Rives E, PA-C  ?dapagliflozin propanediol (FARXIGA) 10 MG TABS tablet Take 1 tablet (10 mg total) by mouth daily. 06/29/21  Yes Oswald Hillock, MD  ?dorzolamide-timolol (COSOPT) 22.3-6.8 MG/ML ophthalmic solution Place 1 drop into both eyes 2 (two) times daily.   Yes [provider]  ?ezetimibe (ZETIA) 10 MG tablet TAKE 1 TABLET(10 MG) BY MOUTH DAILY ?Patient taking differently: Take 10 mg by mouth daily. 04/16/19  Yes Nahser, Wonda Cheng, MD  ?furosemide (LASIX) 40 MG tablet Take 1 tablet (40 mg total) by mouth 2 (two) times daily. 06/28/21 10/26/21 Yes Oswald Hillock, MD  ?latanoprost (XALATAN) 0.005 % ophthalmic solution Place 1 drop into both eyes at bedtime.  05/27/13  Yes [provider]  ?SODIUM  FLUORIDE 5000 PPM 1.1 % PSTE Apply 1 application. topically in the morning and at bedtime. 05/17/21  Yes [provider]  ?spironolactone (ALDACTONE) 25 MG tablet Take 0.5 tablets (12.5 mg total) by mouth dail

## 2021-07-05 NOTE — Progress Notes (Signed)
eLink Physician-Brief Progress Note ?Patient Name: Roberto Ponce ?DOB: 06/28/1950 ?MRN: [id number] ? ? ?Date of Service ? 07/05/2021  ?HPI/Events of Note ? 71 year old man admitted to ICU with acute respiratory failure needing BIPAP. ER note reviewed. PCCM note pending and the team is admitting the patient. Recent intubation for CHF and pneumonia. Given lasix in ER. Has arrived to ICU on BIPAP 11/24/43%, o2 sat is 100 and current RR is 20, he is not in any distress. HR low 100s. SBP 100s.   ?eICU Interventions ? PCCM aware and admitting. On Therapeutic dose lovenox for presumed PE ?Call E link if needed  ? ? ? ?Intervention Category ?Major Interventions: Respiratory failure - evaluation and management ? ?Lovie Agresta G Envi Eagleson ?07/05/2021, 1:38 AM ?

## 2021-07-05 NOTE — Progress Notes (Addendum)
ANTICOAGULATION CONSULT NOTE - Initial Consult ? ?Pharmacy Consult for enoxaparin ?Indication: pulmonary embolus ? ?No Known Allergies ? ?Patient Measurements: ?Height: '5\' 9"'$  (175.3 cm) ?IBW/kg (Calculated) : 70.7 ?Heparin Dosing Weight: 73.9kg ? ?Vital Signs: ?Temp: 97.8 ?F (36.6 ?C) (05/16 2206) ?Temp Source: Oral (05/16 2206) ?BP: 107/70 (05/17 0030) ?Pulse Rate: 113 (05/17 0030) ? ?Labs: ?Recent Labs  ?  07/04/21 ?2210 07/04/21 ?2350  ?HGB 12.3*  --   ?HCT 35.5*  --   ?PLT 506*  --   ?LABPROT 14.3  --   ?INR 1.1  --   ?CREATININE 1.25*  --   ?TROPONINIHS 8 10  ? ? ?Estimated Creatinine Clearance: 55 mL/min (A) (by C-G formula based on SCr of 1.25 mg/dL (H)). ? ? ?Medical History: ?Past Medical History:  ?Diagnosis Date  ? Asthma   ? Coronary artery disease   ? Glaucoma   ? History of gallstones   ? Hyperlipidemia   ? Hypotension due to medication 06/22/2021  ? LVH (left ventricular hypertrophy)   ? MI (myocardial infarction) ([place])   ? MI, old   ? Mild aortic sclerosis   ? Mitral regurgitation   ? Mixed dyslipidemia   ? Shock ([place]) 06/22/2021  ? Venous stasis   ? ? ? ?Assessment: ?71 yo male brought in by EMS with difficulty breathing that onset today after eating dinner.  Patient was recently in the hospital last week for respiratory failure thought secondary to CHF and possibly pneumonia.  He was mechanically ventilated for 2 days.  Pharmacy consulted to dose enoxaparin for PE, no prior AC noted. ? ?Baseline labs WNL ?Hgb 12.3, plts 506, Scr 1.25 ?DDimer >20 ? ?Goal of Therapy:  ?Anti-Xa level 0.6-1 units/ml 4hrs after LMWH dose given ?Monitor platelets by anticoagulation protocol: Yes ?  ?Plan:  ?Enoxaparin '1mg'$ /kg SQ q12h ?CBC q72h ?Follow up scan ? ?Dolly Rias RPh ?07/05/2021, 12:55 AM ? ? ? ?

## 2021-07-06 ENCOUNTER — Inpatient Hospital Stay (HOSPITAL_COMMUNITY): Payer: PPO

## 2021-07-06 DIAGNOSIS — I82461 Acute embolism and thrombosis of right calf muscular vein: Secondary | ICD-10-CM

## 2021-07-06 DIAGNOSIS — J9601 Acute respiratory failure with hypoxia: Secondary | ICD-10-CM | POA: Diagnosis not present

## 2021-07-06 DIAGNOSIS — I5023 Acute on chronic systolic (congestive) heart failure: Secondary | ICD-10-CM

## 2021-07-06 DIAGNOSIS — I340 Nonrheumatic mitral (valve) insufficiency: Secondary | ICD-10-CM | POA: Diagnosis not present

## 2021-07-06 LAB — BASIC METABOLIC PANEL
Anion gap: 9 (ref 5–15)
BUN: 39 mg/dL — ABNORMAL HIGH (ref 8–23)
CO2: 27 mmol/L (ref 22–32)
Calcium: 8.4 mg/dL — ABNORMAL LOW (ref 8.9–10.3)
Chloride: 93 mmol/L — ABNORMAL LOW (ref 98–111)
Creatinine, Ser: 0.54 mg/dL — ABNORMAL LOW (ref 0.61–1.24)
GFR, Estimated: >60 mL/min (ref >60)
Glucose, Bld: 146 mg/dL — ABNORMAL HIGH (ref 70–99)
Potassium: 4 mmol/L (ref 3.5–5.1)
Sodium: 129 mmol/L — ABNORMAL LOW (ref 135–145)

## 2021-07-06 LAB — BLOOD CULTURE ID PANEL (REFLEXED) - BCID2

## 2021-07-06 LAB — CBC
HCT: 32.2 % — ABNORMAL LOW (ref 39.0–52.0)
Hemoglobin: 11.1 g/dL — ABNORMAL LOW (ref 13.0–17.0)
MCH: 30.1 pg (ref 26.0–34.0)
MCHC: 34.5 g/dL (ref 30.0–36.0)
MCV: 87.3 fL (ref 80.0–100.0)
Platelets: 397 10*3/uL (ref 150–400)
RBC: 3.69 MIL/uL — ABNORMAL LOW (ref 4.22–5.81)
RDW: 14.6 % (ref 11.5–15.5)
WBC: 16.2 10*3/uL — ABNORMAL HIGH (ref 4.0–10.5)
nRBC: 0 % (ref 0.0–0.2)

## 2021-07-06 LAB — LEGIONELLA PNEUMOPHILA SEROGP 1 UR AG: L. pneumophila Serogp 1 Ur Ag: NEGATIVE

## 2021-07-06 LAB — PROCALCITONIN: Procalcitonin: <0.1 ng/mL

## 2021-07-06 LAB — MRSA NEXT GEN BY PCR, NASAL: MRSA by PCR Next Gen: NOT DETECTED

## 2021-07-06 MED ORDER — ENOXAPARIN SODIUM 80 MG/0.8ML IJ SOSY
1.0000 mg/kg | PREFILLED_SYRINGE | Freq: Two times a day (BID) | INTRAMUSCULAR | Status: DC
  Administered 2021-07-06 – 2021-07-09 (×6): 65 mg via SUBCUTANEOUS
  Filled 2021-07-06 (×5): qty 0.8
  Filled 2021-07-06: qty 0.65

## 2021-07-06 NOTE — TOC Initial Note (Signed)
Transition of Care [facility]) - Initial/Assessment Note    Patient Details  Name: Roberto Ponce MRN: [id number] Date of Birth: 06-Jun-1950  Transition of Care [facility]) CM/SW Contact:    Leeroy Cha, RN Phone Number: 07/06/2021, 9:17 AM  Clinical Narrative:                  Transition of Care [facility]) Screening Note   Patient Details  Name: Roberto Ponce Date of Birth: [birth year]   Transition of Care [facility]) CM/SW Contact:    Leeroy Cha, RN Phone Number: 07/06/2021, 9:17 AM    Transition of Care Department [facility]) has reviewed patient and no TOC needs have been identified at this time. We will continue to monitor patient advancement through interdisciplinary progression rounds. If new patient transition needs arise, please place a TOC consult.    Expected Discharge Plan: [place] Barriers to Discharge: No Barriers Identified   Patient Goals and CMS Choice Patient states their goals for this hospitalization and ongoing recovery are:: to go home CMS Medicare.gov Compare Post Acute Care list provided to:: Patient    Expected Discharge Plan and Services Expected Discharge Plan: [place]   Discharge Planning Services: CM Consult Post Acute Care Choice: [place] arrangements for the past 2 months: Single Family Home                                      Prior Living Arrangements/Services Living arrangements for the past 2 months: Single Family Home Lives with:: Spouse Patient language and need for interpreter reviewed:: Yes Do you feel safe going back to the place where you live?: Yes            Criminal Activity/Legal Involvement Pertinent to Current Situation/Hospitalization: No - Comment as needed  Activities of Daily Living      Permission Sought/Granted                  Emotional Assessment Appearance:: Appears stated age     Orientation: : Oriented to Place, Oriented to Self,  Oriented to  Time, Oriented to Situation Alcohol / Substance Use: Not Applicable Psych Involvement: No (comment)  Admission diagnosis:  Acute systolic congestive heart failure (HCC) [I50.21] Acute respiratory failure with hypoxia (HCC) [J96.01] Acute hypoxemic respiratory failure ([place]) [J96.01] Patient Active Problem List   Diagnosis Date Noted   Acute hypoxemic respiratory failure ([place]) 07/05/2021   MR (mitral regurgitation) 06/23/2021   Abnormal LFTs 06/23/2021   Normocytic anemia 06/23/2021   Leucocytosis 12/16/2534   Acute systolic CHF (congestive heart failure) ([place]) 06/21/2021   Severe sepsis ([place]) 06/20/2021   Acute respiratory failure with hypoxia ([place]) 06/20/2021   ARDS (adult respiratory distress syndrome) ([place])    CAP (community acquired pneumonia) 06/18/2021   Hyponatremia 06/18/2021   Testis cancer ([place]) 05/18/2021   S/P CABG x 2 02/15/2021   Non-rheumatic mitral regurgitation 02/14/2021   Mitral regurgitation 07/22/2020   Coronary artery disease involving native coronary artery of native heart without angina pectoris 07/09/2016   CAD (coronary artery disease) of artery bypass graft 05/27/2013   Mixed hyperlipidemia 05/27/2013   Glaucoma 05/27/2013   Acute MI anterior wall first episode care ([place]) 05/27/2013   LVH (left ventricular hypertrophy) 05/27/2013   Venous stasis 05/27/2013   Asthmatic bronchitis 05/27/2013   Gallstones 05/27/2013   PCP:  Kristen Loader, FNP Pharmacy:  [facility] [phone] [address], [place] [facility] [address] AT [facility] [address] [place] [address] Phone: [phone] Fax: [phone]  [facility] - [place], [place] - [address] [place] [address] Phone: [phone] Fax: [phone]  [facility] [address] [place] [address] Phone: [phone] Fax: [place], [place] - [place] AT [facility] [address] [place] [address] Phone: [phone] Fax: [phone]     Social Determinants of Health (SDOH) Interventions    Readmission Risk Interventions     View : No data to display.

## 2021-07-06 NOTE — Consult Note (Addendum)
Cardiology Consultation:   Patient ID: Roberto Ponce MRN: [id number]; DOB: November 04, 1950  Admit date: 07/04/2021 Date of Consult: 07/06/2021  PCP:  Roberto Ponce, [place] Providers Cardiologist:  Roberto Ponce    Patient Profile:   Roberto Ponce is a 71 y.o. 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  History of Present Illness:   [name] follows Roberto Ponce historically. He has history of CAD and underwent CABG in 1988 when he was 71 years old, with LIMA to LAD and RIMA to RCA. He had long history of mild mitral regurgitation. TEE from 09/02/20 showed both anterior mitral valve prolapse and some posterior tetthering that lead to a eccentric mitral regurgitation. TTE from 01/09/21 showed severe MR with LVEF 55%, with increased exertional dyspnea and fatigue,  he was referred to Roberto Ponce for potential treatment on 01/26/21.  Given hx of sternotomy and remote CABG, transcatheter edge-to-edge repair of the mitral valve was felt a reasonable treatment option.   He underwent right and left heart cath on 02/14/21 which showed severe 3 vessel CAD with total occlusion of the LAD and RCA, and severe stenosis of the native left circumflex. Patent LIMA to LAD and RIMA to RCA; DES placed to mid Cx successfully. Severe mitral regurgitation by echo assessment, hemodynamics demonstrate low wedge pressure, no V wave, and no pulmonary hypertension. He was placed on ASA +Plavix for 6 months, with plan to repeat Echo and defer transcatheter edge-to-edge mitral valve repair in 6 months.  He was recently hospitalized for acute hypoxic respiratory failure due to  pneumonia 06/18/21-06/28/21, he was briefly intubated, underwent BAL, viral panel/autoimmune workup/HIV /Strep pneum and Legionella antigens were negative. He was treated with vancomycin, ceftriaxone, cefepime, and azithromycin. Cardiology was consulted for severe MR and concern of CHF. Echo from 06/20/21 showed LVEF 40-45%, grade II DD, mild refuced RV, mod elevated PSAP with RVSP 58 mmHg, mild to mod TR, aortic sclerosis. Moderate mitral valve regurgitation. Eccentric posterior directed MR jet. Difficult to quantify severity given eccentric jet, but normal LA/LV size argues against severe MR. It was felt there is superimposed new onset of acute systolic heart failure in the setting of pneumonia, although not likely acute MR, and he was started on Lasix and transitioned to PO 36m BID at time of discharge. GDMT was maintained with spironolactone, farxiga, coreg, losartan caused hypotension and stopped.   Patient presented to ER 07/05/21 with recurrent sudden onset of dyspnea and hypoxia in low 80% on room air. He was placed on BIPAP at ED. It was felt he was in acute pulmonary edema recurrence of post-viral BOOP. He was started on steroid. Echo repeated on 07/05/21 showed LVEF 40-45%, indeterminate diastolic parameters, akinesis of the left ventricular, basal-mid inferior wall; hypokinesis of the left ventricular, mid-apical inferolateral wall and inferior wall. Normal RV, mildly elevated PASP with RVSP 37.4 mmHg, Mild to moderate mitral valve regurgitation, mild to moderate TR, no significant change from prior study. Cardiology is consulted today for further recommendation of CHF.   Admission diagnostic so far showed Hyponatremia 123 >129, hypochloridemia 86 >93, hyperglycemia 172 >146, Cr 1.25>0.54, albumin 2.6, mildly  elevated AST 47 and ALT 73. BNP 89.9. Hs trop negative x2. WBC 13200 >16200, Hgb 12.3 >11.1. PLT 506k >397k. D-dimer >20. INR 1.1. Procalcitonin <0.1. Flu and COVID-19 negative. UA + glucose and Hgb  and bacteria. 1/2 blood culture + Staphylococcus epidermidis. CXR from 07/04/21 showed Progressive asymmetric bilateral airspace disease, left greater than right. Findings could reflect infection or asymmetric edema.  Doppler from 06/25/21 showed acute DVT involving the right popliteal vein, right posterior tibial veins, right peroneal veins, and right gastrocnemius veins.   Upon encounter, patient is eating breakfast, stating he is feeling fatigued. He recalls having sudden onset of SOB on Tuesday. He was discharged home with [facility] since last admission on 06/28/21, has been on oxygen at home. He is SOB with exertional activity, has not been doing much due to fatigue and SOB. He denied any chest pain, dizziness, syncope, leg pain, rapid weight gain, leg edema. He states he is taking Lasix 48m BID, urinating well, lost 7 pounds over the past week. He denied any known lung disease, had childhood asthma, does not smoke.       Past Medical History:  Diagnosis Date   Asthma    Coronary artery disease    Glaucoma    History of gallstones    Hyperlipidemia    Hypotension due to medication 06/22/2021   LVH (left ventricular hypertrophy)    [place]ocardial [place]) [place]    MI, old    Mild aortic sclerosis    Mitral regurgitation    Mixed dyslipidemia    [place] 06/22[place]   Venous stasis     Past Surgical History:  Procedure Laterality Date   CARDIAC CATHETERIZATION  09/09/1986   EF 56%   CARDIOVASCULAR STRESS TEST  12/16/2007   EF 54% NO ISCHEMIA   CHOLECYSTECTOMY     CORONARY ARTERY BYPASS GRAFT     LIMA TO THE LAD AND RIMA TO RIGHT CORONARY ARTERY   CORONARY STENT INTERVENTION N/A 02/14/2021   Procedure: CORONARY STENT INTERVENTION;  Surgeon: CSherren Mocha MD;  [facility];  Service: Cardiovascular;  Laterality: N/A;   ORCHIECTOMY Right 04/19/2021   Procedure: RIGHT RADICAL INGUINAL ORCHIECTOMY;  Surgeon: BLucas Mallow MD;  Location: [facility];  Service: Urology;   Laterality: Right;  1 HR FOR CASE   RIGHT/LEFT HEART CATH AND CORONARY/GRAFT ANGIOGRAPHY N/A 02/14/2021   Procedure: RIGHT/LEFT HEART CATH AND CORONARY/GRAFT ANGIOGRAPHY;  Surgeon: CSherren Mocha MD;  [facility];  Service: Cardiovascular;  Laterality: N/A;   TEE WITHOUT CARDIOVERSION N/A 09/02/2020   Procedure: TRANSESOPHAGEAL ECHOCARDIOGRAM (TEE);  Surgeon: CWerner Lean MD;  [facility];  Service: Cardiovascular;  Laterality: N/A;   [place]  12/12/2007   EF 55-60%   VASECTOMY  1988     Home Medications:  Prior to Admission medications   Medication Sig Start Date End Date Taking? Authorizing Provider  acetaminophen (TYLENOL) 500 MG tablet Take 1,000 mg by mouth every 6 (six) hours as needed for moderate pain.   Yes [provider]  aspirin EC 81 MG tablet Take 1 tablet (81 mg total) by mouth daily. 06/21/14  Yes Nahser, PWonda Cheng MD  brimonidine (ALPHAGAN) 0.2 % ophthalmic solution Place 1 drop into the left eye 2 (two) times daily. 12/19/20  Yes [provider]  carvedilol (COREG) 3.125 MG tablet Take 1 tablet (3.125 mg total) by mouth 2 (two) times daily with a meal. 06/28/21  Yes LOswald Hillock MD  clopidogrel (PLAVIX) 75 MG tablet Take 1 tablet (75 mg total) by mouth daily with breakfast. 02/15/21  Yes Sande Rives E, PA-C  dapagliflozin propanediol (FARXIGA) 10 MG TABS tablet Take 1 tablet (10 mg total) by mouth daily. 06/29/21  Yes Oswald Hillock, MD  dorzolamide-timolol (COSOPT) 22.3-6.8 MG/ML ophthalmic solution Place 1 drop into both eyes 2 (two) times daily.   Yes [provider]  ezetimibe (ZETIA) 10 MG tablet TAKE 1 TABLET(10 MG) BY MOUTH DAILY Patient taking differently: Take 10 mg by mouth daily. 04/16/19  Yes Nahser, Wonda Cheng, MD  furosemide (LASIX) 40 MG tablet Take 1 tablet (40 mg total) by mouth 2 (two) times daily. 06/28/21 10/26/21 Yes Lama, Marge Duncans, MD  latanoprost (XALATAN) 0.005 % ophthalmic  solution Place 1 drop into both eyes at bedtime.  05/27/13  Yes [provider]  SODIUM FLUORIDE 5000 PPM 1.1 % PSTE Apply 1 application. topically in the morning and at bedtime. 05/17/21  Yes [provider]  spironolactone (ALDACTONE) 25 MG tablet Take 0.5 tablets (12.5 mg total) by mouth daily. 06/29/21  Yes Oswald Hillock, MD  triamcinolone cream (KENALOG) 0.1 % Apply 1 application topically 2 (two) times daily as needed (eczema).   Yes [provider]  nitroGLYCERIN (NITROSTAT) 0.4 MG SL tablet Place 1 tablet (0.4 mg total) under the tongue every 5 (five) minutes as needed for chest pain. Patient not taking: Reported on 07/05/2021 02/15/21 02/15/22  Darreld Mclean, PA-C    Inpatient Medications: Scheduled Meds:  aspirin EC  81 mg Oral Daily   brimonidine  1 drop Left Eye BID   carvedilol  3.125 mg Oral BID WC   chlorhexidine  15 mL Mouth Rinse BID   chlorhexidine  15 mL Mouth Rinse BID   Chlorhexidine Gluconate Cloth  6 each Topical Daily   clopidogrel  75 mg Oral Q breakfast   dapagliflozin propanediol  10 mg Oral Daily   dorzolamide-timolol  1 drop Both Eyes BID   enoxaparin (LOVENOX) injection  1 mg/kg Subcutaneous Q12H   ezetimibe  10 mg Oral Daily   furosemide  20 mg Oral Daily   latanoprost  1 drop Both Eyes QHS   mouth rinse  15 mL Mouth Rinse q12n4p   methylPREDNISolone (SOLU-MEDROL) injection  80 mg Intravenous Q12H   spironolactone  12.5 mg Oral Daily   Continuous Infusions:  PRN Meds: docusate sodium, iohexol, ipratropium-albuterol, ondansetron (ZOFRAN) IV, polyethylene glycol  Allergies:   No Known Allergies  Social History:   Social History   Socioeconomic History   Marital status: Married    Spouse name: Not on file   Number of children: Not on file   Years of education: Not on file   Highest education level: Not on file  Occupational History   Not on file  Tobacco Use   Smoking status: Never   Smokeless tobacco: Never   Vaping Use   Vaping Use: Never used  Substance and Sexual Activity   Alcohol use: No   Drug use: No   Sexual activity: Not on file  Other Topics Concern   Not on file  Social History Narrative   Not on file   Social Determinants of Health   Financial Resource Strain: Not on file  Food Insecurity: Not on file  Transportation Needs: Not on file  Physical Activity: Not on file  Stress: Not on file  Social Connections: Not on file  Intimate Partner Violence: Not on file  Family History:    Family History  Problem Relation Age of Onset   Heart attack Father    Stroke Father      ROS:  Constitutional: fatigue  Eyes: Denied vision change or loss Ears/Nose/Mouth/Throat: Denied ear ache, sore throat, coughing, sinus pain Cardiovascular: denied chest pain/pressure Respiratory:see HPI  Gastrointestinal: Denied nausea, vomiting, abdominal pain, diarrhea Genital/Urinary: Denied dysuria, hematuria, urinary frequency/urgency Musculoskeletal: Denied muscle ache, joint pain Skin: Denied rash, wound Neuro: Denied headache, dizziness, syncope Psych: Denied history of depression/anxiety  Endocrine: Denied history of diabetes     Physical Exam/Data:   Vitals:   07/06/21 0500 07/06/21 0600 07/06/21 0700 07/06/21 0800  BP: (!) 118/59 103/64 120/62 (!) 142/64  Pulse: 91 84 84 89  Resp: (!) 35 (!) 24 (!) 29 (!) 40  Temp:      TempSrc:      SpO2: 90% 97% 94% 100%  Weight:      Height:        Intake/Output Summary (Last 24 hours) at 07/06/2021 0857 Last data filed at 07/06/2021 0449 Gross per 24 hour  Intake --  Output 2800 ml  Net -2800 ml      07/05/2021    3:00 AM 07/05/2021    1:40 AM 06/27/2021    4:15 AM  Last 3 Weights  Weight (lbs) 145 lb 11.6 oz 145 lb 11.6 oz 163 lb  Weight (kg) 66.1 kg 66.1 kg 73.936 kg     Body mass index is 21.52 kg/m.   Vitals:  Vitals:   07/06/21 0700 07/06/21 0800  BP: 120/62 (!) 142/64  Pulse: 84 89  Resp: (!) 29 (!) 40  Temp:     SpO2: 94% 100%   General Appearance: In no apparent distress, sitting in bed HEENT: Normocephalic, atraumatic.  Neck: Supple, trachea midline, no JVDs Cardiovascular: Regular rate and rhythm, normal S1-S2,  grade II murmur noted Respiratory: Resting breathing unlabored, lungs sounds clear to auscultation bilaterally, no use of accessory muscles. On 2LNC pox 99%.  No wheezes, rales or rhonchi.   Gastrointestinal: Bowel sounds positive, abdomen soft Extremities: Able to move all extremities in bed without difficulty, no edema of BLE Musculoskeletal: Normal muscle bulk and tone Skin: Intact, warm, dry. No rashes or petechiae noted in exposed areas.  Neurologic: Alert, oriented to person, place and time. Fluent speech, no facial droop, no cognitive deficit Psychiatric: Normal affect. Mood is appropriate.    EKG:  The EKG was personally reviewed and demonstrates:  Sinus tachycardia 106 bpm, non-specific T wave abnormalities   Telemetry:  Telemetry was personally reviewed and demonstrates:  Sinus rhythm with occasional PVCs   Relevant CV Studies:  Echo from 07/05/21:   1. Left ventricular ejection fraction, by estimation, is 40 to 45%. The  left ventricle has mildly decreased function. The left ventricle  demonstrates regional wall motion abnormalities (see scoring  diagram/findings for description). There is mild  concentric left ventricular hypertrophy. Left ventricular diastolic  parameters are indeterminate. There is akinesis of the left ventricular,  basal-mid inferior wall. There is hypokinesis of the left ventricular,  mid-apical inferolateral wall and inferior   wall.   2. Right ventricular systolic function is normal. The right ventricular  size is normal. There is mildly elevated pulmonary artery systolic  pressure.   3. The mitral valve is abnormal. Mild to moderate mitral valve  regurgitation.   4. Tricuspid valve regurgitation is mild to moderate.   5. The aortic valve  is normal in structure. Aortic  valve regurgitation is  not visualized. No aortic stenosis is present.   Comparison(s): No significant change from prior study. Prior images  reviewed side by side.   Echo from 06/20/21:  1. Left ventricular ejection fraction, by estimation, is 40 to 45%. The  left ventricle has mildly decreased function. The left ventricle  demonstrates regional wall motion abnormalities (see scoring  diagram/findings for description). There is mild left  ventricular hypertrophy. Left ventricular diastolic parameters are  consistent with Grade II diastolic dysfunction (pseudonormalization).  Elevated left atrial pressure.   2. Right ventricular systolic function is mildly reduced. The right  ventricular size is mildly enlarged. There is moderately elevated  pulmonary artery systolic pressure. The estimated right ventricular  systolic pressure is 26.2 mmHg.   3. Tricuspid valve regurgitation is mild to moderate.   4. The aortic valve is tricuspid. Aortic valve regurgitation is not  visualized. Aortic valve sclerosis is present, with no evidence of aortic  valve stenosis.   5. The inferior vena cava is dilated in size with <50% respiratory  variability, suggesting right atrial pressure of 15 mmHg.   6. The mitral valve is abnormal. Moderate mitral valve regurgitation.  Eccentric posterior directed MR jet. Difficult to quantify severity given  eccentric jet, but normal LA/LV size argues against severe MR. Consider  TEE or cardiac MRI for further  evaluation   Right and left heart cath from 02/14/21:  1.  Severe 3-vessel coronary artery disease with total occlusion of the LAD and total occlusion of the RCA, and severe stenosis of the native left circumflex 2.  Status post CABG with continued patency of the LIMA to LAD and RIMA to RCA 3.  Successful PCI of severe 95% stenosis of the mid circumflex, treated with a 3.0 x 15 mm Onyx frontier DES 4.  Severe mitral  regurgitation by echo assessment, hemodynamics demonstrate low wedge pressure, no V wave, and no pulmonary hypertension.  Recommendations: DAPT with aspirin and clopidogrel x6 months, repeat echocardiogram in 6 months, defer transcatheter edge-to-edge mitral valve repair at present to reevaluate after PCI.    Laboratory Data:  High Sensitivity Troponin:   Recent Labs  Lab 06/20/21 1723 06/21/21 0500 07/04/21 2210 07/04/21 2350  TROPONINIHS 61* _0 Chemistry Recent Labs  Lab 07/04/21 2210 07/06/21 0238  NA 123* 129*  K 4.1 4.0  CL 86* 93*  CO2 28 27  GLUCOSE 172* 146*  BUN 18 39*  CREATININE 1.25* 0.54*  CALCIUM 8.2* 8.4*  GFRNONAA >60 >60  ANIONGAP 9 9    Recent Labs  Lab 07/04/21 2210  PROT 7.9  ALBUMIN 2.6*  AST 47*  ALT 73*  ALKPHOS 106  BILITOT 0.9   Lipids No results for input(s): CHOL, TRIG, HDL, LABVLDL, LDLCALC, CHOLHDL in the last 168 hours.  Hematology Recent Labs  Lab 07/04/21 2210 07/06/21 0238  WBC 13.2* 16.2*  RBC 4.08* 3.69*  HGB 12.3* 11.1*  HCT 35.5* 32.2*  MCV 87.0 87.3  MCH 30.1 30.1  MCHC 34.6 34.5  RDW 14.7 14.6  PLT 506* 397   Thyroid No results for input(s): TSH, FREET4 in the last 168 hours.  BNP Recent Labs  Lab 07/04/21 2210  BNP 89.9    DDimer  Recent Labs  Lab 07/04/21 2210  DDIMER >20.00*     Radiology/Studies:  DG Chest Port 1 View  Result Date: 07/06/2021 CLINICAL DATA:  Admission for respiratory failure EXAM: PORTABLE CHEST 1 VIEW COMPARISON:  Portable chest  07/04/2021. FINDINGS: 4:15 a.m., 07/06/2021. There are CABG changes mild cardiomegaly without central vascular prominence. Stable mediastinum with aortic tortuosity and atherosclerosis. There is a low inspiration on exam. Diffuse patchy interstitial and alveolar opacities of the left lung fields continue to be noted without improvement or worsening as well as patchy hazy opacities of the right mid to lower lung field which are much less dense.  There has been no significant change in overall aeration. There is a small left pleural effusion, trace right pleural effusion. Intact thoracic cage. IMPRESSION: 1. No change in left-greater-than-right interstitial and airspace disease and small left and minimal right pleural effusions. Cardiac size is stable. Electronically Signed   By: Telford Nab M.D.   On: 07/06/2021 07:34   DG Chest Portable 1 View  Result Date: 07/04/2021 CLINICAL DATA:  Short of breath, hypoxia EXAM: PORTABLE CHEST 1 VIEW COMPARISON:  06/26/2021 FINDINGS: Single frontal view of the chest demonstrates stable postsurgical changes from median sternotomy. Cardiac silhouette is unremarkable. There is progressive asymmetric bilateral airspace disease, left greater than right. Small bilateral effusions are suspected. No pneumothorax. No acute bony abnormalities. IMPRESSION: 1. Progressive asymmetric bilateral airspace disease, left greater than right. Findings could reflect infection or asymmetric edema. Electronically Signed   By: Randa Ngo M.D.   On: 07/04/2021 22:36   ECHOCARDIOGRAM COMPLETE  Result Date: 07/05/2021    ECHOCARDIOGRAM REPORT   Patient Name:   Roberto Ponce Date of Exam: 07/05/2021 Medical Rec #:  [id number]           Height:       69.0 in Accession #:    [id number]          Weight:       145.7 lb Date of Birth:  [birth year]           BSA:          1.806 m Patient Age:    51 years            BP:           102/61 mmHg Patient Gender: M                   HR:           76 bpm. Exam Location:  Inpatient Procedure: 2D Echo, Cardiac Doppler and Color Doppler Indications:    Acute respiratory distress  History:        Patient has prior history of Echocardiogram examinations, most                 recent 06/20/2021. CHF, CAD and Previous Myocardial Infarction,                 Signs/Symptoms:Murmur; Risk Factors:Dyslipidemia. CABG x 2.  Sonographer:    Luisa Hart RDCS Referring Phys: [id number] [place]  1.  Left ventricular ejection fraction, by estimation, is 40 to 45%. The left ventricle has mildly decreased function. The left ventricle demonstrates regional wall motion abnormalities (see scoring diagram/findings for description). There is mild concentric left ventricular hypertrophy. Left ventricular diastolic parameters are indeterminate. There is akinesis of the left ventricular, basal-mid inferior wall. There is hypokinesis of the left ventricular, mid-apical inferolateral wall and inferior  wall.  2. Right ventricular systolic function is normal. The right ventricular size is normal. There is mildly elevated pulmonary artery systolic pressure.  3. The mitral valve is abnormal. Mild to moderate mitral valve regurgitation.  4. Tricuspid valve regurgitation is mild to  moderate.  5. The aortic valve is normal in structure. Aortic valve regurgitation is not visualized. No aortic stenosis is present. Comparison(s): No significant change from prior study. Prior images reviewed side by side. FINDINGS  Left Ventricle: Left ventricular ejection fraction, by estimation, is 40 to 45%. The left ventricle has mildly decreased function. The left ventricle demonstrates regional wall motion abnormalities. The left ventricular internal cavity size was normal in size. There is mild concentric left ventricular hypertrophy. Left ventricular diastolic parameters are indeterminate. Right Ventricle: The right ventricular size is normal. Right vetricular wall thickness was not well visualized. Right ventricular systolic function is normal. There is mildly elevated pulmonary artery systolic pressure. The tricuspid regurgitant velocity  is 2.71 m/s, and with an assumed right atrial pressure of 8 mmHg, the estimated right ventricular systolic pressure is 50.3 mmHg. Left Atrium: Left atrial size was normal in size. Right Atrium: Right atrial size was normal in size. Pericardium: There is no evidence of pericardial effusion. Mitral Valve:  The mitral valve is abnormal. Mild to moderate mitral valve regurgitation, with eccentric posteriorly directed jet. Tricuspid Valve: The tricuspid valve is normal in structure. Tricuspid valve regurgitation is mild to moderate. No evidence of tricuspid stenosis. Aortic Valve: The aortic valve is normal in structure. Aortic valve regurgitation is not visualized. No aortic stenosis is present. Aortic valve mean gradient measures 3.0 mmHg. Aortic valve peak gradient measures 6.7 mmHg. Aortic valve area, by VTI measures 2.55 cm. Pulmonic Valve: The pulmonic valve was grossly normal. Pulmonic valve regurgitation is not visualized. No evidence of pulmonic stenosis. Aorta: The aortic root, ascending aorta and aortic arch are all structurally normal, with no evidence of dilitation or obstruction. Venous: The inferior vena cava was not well visualized. IAS/Shunts: The interatrial septum was not well visualized.  LEFT VENTRICLE PLAX 2D LVIDd:         4.40 cm     Diastology LVIDs:         3.50 cm     LV e' medial:    8.05 cm/s LV PW:         1.20 cm     LV E/e' medial:  10.1 LV IVS:        1.20 cm     LV e' lateral:   14.30 cm/s LVOT diam:     2.00 cm     LV E/e' lateral: 5.7 LV SV:         58 LV SV Index:   32 LVOT Area:     3.14 cm  LV Volumes (MOD) LV vol d, MOD A2C: 49.9 ml LV vol d, MOD A4C: 71.0 ml LV vol s, MOD A2C: 31.4 ml LV vol s, MOD A4C: 47.8 ml LV SV MOD A2C:     18.5 ml LV SV MOD A4C:     71.0 ml LV SV MOD BP:      23.6 ml RIGHT VENTRICLE RV Basal diam:  3.10 cm RV Mid diam:    2.40 cm RV S prime:     8.33 cm/s TAPSE (M-mode): 1.5 cm LEFT ATRIUM             Index        RIGHT ATRIUM           Index LA diam:        3.90 cm 2.16 cm/m   RA Area:     11.50 cm LA Vol (A2C):   36.5 ml 20.21 ml/m  RA Volume:   28.20  ml  15.62 ml/m LA Vol (A4C):   51.2 ml 28.35 ml/m LA Biplane Vol: 44.0 ml 24.36 ml/m  AORTIC VALVE                    PULMONIC VALVE AV Area (Vmax):    2.36 cm     PV Vmax:       1.15 m/s AV Area  (Vmean):   2.00 cm     PV Vmean:      71.600 cm/s AV Area (VTI):     2.55 cm     PV VTI:        0.188 m AV Vmax:           129.00 cm/s  PV Peak grad:  5.3 mmHg AV Vmean:          85.900 cm/s  PV Mean grad:  2.0 mmHg AV VTI:            0.227 m AV Peak Grad:      6.7 mmHg AV Mean Grad:      3.0 mmHg LVOT Vmax:         96.80 cm/s LVOT Vmean:        54.600 cm/s LVOT VTI:          0.184 m LVOT/AV VTI ratio: 0.81  AORTA Ao Root diam: 2.90 cm Ao Asc diam:  3.00 cm MITRAL VALVE                  TRICUSPID VALVE MV Area (PHT): 5.13 cm       TR Peak grad:   29.4 mmHg MV Decel Time: 148 msec       TR Vmax:        271.00 cm/s MR Peak grad:    82.6 mmHg MR Mean grad:    52.0 mmHg    SHUNTS MR Vmax:         454.50 cm/s  Systemic VTI:  0.18 m MR Vmean:        339.0 cm/s   Systemic Diam: 2.00 cm MR PISA:         0.57 cm MR PISA Eff ROA: 4 mm MR PISA Radius:  0.30 cm MV E velocity: 81.00 cm/s MV A velocity: 86.80 cm/s MV E/A ratio:  0.93 Buford Dresser MD Electronically signed by Buford Dresser MD Signature Date/Time: 07/05/2021/11:58:23 AM    Final    VAS [place] LOWER EXTREMITY VENOUS (DVT)  Result Date: 07/05/2021  Lower Venous DVT Study Patient Name:  Roberto Ponce  Date of Exam:   07/05/2021 Medical Rec #: [id number]            Accession #:    [id number] Date of Birth: [birth year]            Patient Gender: M Patient Age:   22 years Exam Location:  [facility] Procedure:      VAS [place] LOWER EXTREMITY VENOUS (DVT) Referring Phys: Collier Bullock --------------------------------------------------------------------------------  Indications: Hypoxia.  Limitations: Poor ultrasound/tissue interface and calcific shadowing. Comparison Study: Previous exam on 06/21/21 was negaitve for DVT. Performing Technologist: Rogelia Rohrer RVT, RDMS  Examination Guidelines: A complete evaluation includes B-mode imaging, spectral Doppler, color Doppler, and power Doppler as needed of all accessible portions of each vessel.  Bilateral testing is considered an integral part of a complete examination. Limited examinations for reoccurring indications may be performed as noted. The reflux portion of the exam is performed with the patient in reverse Trendelenburg.  +---------+---------------+---------+-----------+----------+--------------+  RIGHT    CompressibilityPhasicitySpontaneityPropertiesThrombus Aging +---------+---------------+---------+-----------+----------+--------------+ CFV      Full           Yes      Yes                                 +---------+---------------+---------+-----------+----------+--------------+ SFJ      Full                                                        +---------+---------------+---------+-----------+----------+--------------+ FV Prox  Full           Yes      Yes                                 +---------+---------------+---------+-----------+----------+--------------+ FV Mid   Full           Yes      Yes                                 +---------+---------------+---------+-----------+----------+--------------+ FV DistalFull           Yes      Yes                                 +---------+---------------+---------+-----------+----------+--------------+ PFV      Full                                                        +---------+---------------+---------+-----------+----------+--------------+ POP      Partial        No       No                   Acute          +---------+---------------+---------+-----------+----------+--------------+ PTV      Partial        No       No                   Acute          +---------+---------------+---------+-----------+----------+--------------+ PERO     None           No       No                   Acute          +---------+---------------+---------+-----------+----------+--------------+ Gastroc  None           No       No                   Acute           +---------+---------------+---------+-----------+----------+--------------+   +---------+---------------+---------+-----------+----------+--------------+ LEFT     CompressibilityPhasicitySpontaneityPropertiesThrombus Aging +---------+---------------+---------+-----------+----------+--------------+ CFV      Full           Yes      Yes                                 +---------+---------------+---------+-----------+----------+--------------+  SFJ      Full                                                        +---------+---------------+---------+-----------+----------+--------------+ FV Prox  Full           Yes      Yes                                 +---------+---------------+---------+-----------+----------+--------------+ FV Mid   Full           Yes      Yes                                 +---------+---------------+---------+-----------+----------+--------------+ FV DistalFull           Yes      Yes                                 +---------+---------------+---------+-----------+----------+--------------+ PFV      Full                                                        +---------+---------------+---------+-----------+----------+--------------+ POP      Full           Yes      Yes                                 +---------+---------------+---------+-----------+----------+--------------+ PTV      Full                                                        +---------+---------------+---------+-----------+----------+--------------+ PERO     Full                                                        +---------+---------------+---------+-----------+----------+--------------+ Rouleaux noted in popliteal vein    Summary: BILATERAL: - No evidence of superficial venous thrombosis in the lower extremities, bilaterally. -No evidence of popliteal cyst, bilaterally. RIGHT: - Findings consistent with acute deep vein thrombosis involving the right  popliteal vein, right posterior tibial veins, right peroneal veins, and right gastrocnemius veins.  LEFT: - There is no evidence of deep vein thrombosis in the lower extremity.  *See table(s) above for measurements and observations. Electronically signed by Jamelle Haring on 07/05/2021 at 9:22:24 PM.    Final      Assessment and Plan:    Acute hypoxic respiratory failure  - consider rule out acute PE with CTA, does not appear in acute CHF, possible post viral PNA   Chronic systolic and diastolic  heart failure  - Echo repeat 07/05/21 LVEF 40-45%, no acute change of MR  - discharge weight 06/27/21 73.9kg, weight on 07/05/21 was 66.1kg - Net -3.2L so far - clinically euvolemic today  - recommend reduce home dose Lasix to 58m daily given hyponatremia, currently on 268mdaily per ICU team  - GDMT: continue Coreg 3.12559mID, spironolactone 12.5mg21marxiga 10MG (not on losartan due to hx of hypotension), may add Entresto if BP allows   Moderate to severe MR  - symptoms stable - follow up with Roberto CoopBurt Knacke discharged  CAD with hx of CABG 1988 and PCI to mid Cx 01/2021 - Hs trop negative x2 - EKG no acute ischemic findings - no angina symptoms  - continue medical therapy with ASA +Plavix, Coreg, Zetia, ARB dopped due to hypotension last admission, statin was held due to unclear etiology of transaminitis last admission; may need stop one antiplatelet if add on DOAC for DVT to reduce bleeding risk, will review with Roberto Jaydy Fitzhenry/Cooper  Acute DVT of right popliteal /posterior tibial /perLetitia Caulstrocnemius veins  - noted on doppler 07/05/21 - consider rule out PE - on therapeutic Lovenox per ICU   Hyponatremia - was felt due to SIADH in the setting of pneumonia last admission, Na was 130 at discharge on 06/28/21 and 123 on 07/04/21 this admission, now 129, reduce lasix dosing as above, may need to stop spironolactone if not improving   Testicular cancer - under surveillance, pending CT planned  per urology    Risk Assessment/Risk Scores:   New York Heart Association (NYHA) Functional Class NYHA Class II   For questions or updates, please contact [email or web address] Please consult [email or web address] for contact info under    Signed, XikaMargie Billet  07/06/2021 8:57 AM  Patient seen and examined and agree with XikaMargie Billet as detailed above.  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 Notably, LE doppler showed acute right DVT. Cardiology is consulted for acute on chronic systolic heart failure exacerbation.  Patient was hospitalized in for acute hypoxic respiratory failure due to pneumonia 06/18/21-06/28/21 with course complicated with acute systolic heart failure. TTE 06/20/21 showed LVEF 40-45%, grade II DD, mild refuced RV, mod elevated PSAP with RVSP 58 mmHg, mild to mod TR, aortic sclerosis. Moderate mitral valve regurgitation. Eccentric posterior directed MR jet. Difficult to quantify severity given eccentric jet, but normal LA/LV size argues against severe MR. He was diuresed with lasix IV and then transitioned to PO 40mg57m at time of discharge. GDMT was maintained with spironolactone, farxiga, and coreg. Losartan was held due to hypotension.  He now represented to the ER with recurrent, sudden onset of dyspnea and  hypoxia with SpO2 80s requiring BiPAP. Repeat TTE 07/05/21 showed LVEF 40-45%, indeterminate diastolic parameters, akinesis of the left ventricular, basal-mid inferior wall; hypokinesis of the left ventricular, mid-apical inferolateral wall and inferior wall. Normal RV, mildly elevated PASP with  RVSP 37.4 mmHg, likely moderate mitral valve regurgitation, mild to moderate TR, no significant change from prior study. BNP on admission 89. Discharge weight was 73.9Kg and wt currently 66.1kg.  Currently, the patient  does not appear acutely decompensated from a HF standpoint. Wt is below discharge weight, BNP normal, Na has improved with decreasing PO diuretic. MR appears stable (highly eccentric on TTE but suspect moderate vs moderate to severe) and no evidence of ACS with normal trop. Procal negative. Notably, LE doppler shows acute DVT and therefore PE is a high possibility. Agree with work-up for PE.  GEN: No acute distress.   Neck: No JVD Cardiac: RRR, 2/6 systolic murmur Respiratory: Bibasilar crackles; fair air movement GI: Soft, nontender, non-distended  MS: No edema; No deformity. Neuro:  Nonfocal  Psych: Normal affect     Plan: -Recommend CTA for assessment of PE once able -On lovenox for acute RLE DVT -Does not appear acutely decompensated currently from a HF standpoint (wt is below discharge weight, BNP normal, Na improved with backing down on diuretics) -Will plan on plavix and oral anticoagulation going forward given PCI in 01/2021 -Continue coreg 3.18m BID -Continue farxiga 1780mdaily -Continue spiro 12.80m43maily -On maintenance diuretic with lasix 70m70mily -Will plan for outpatient follow-up for MR -Monitor I/Os and daily weights  Plan discussed with the patient and family at length at bedside.   HeatGwyndolyn Kaufman

## 2021-07-06 NOTE — Progress Notes (Signed)
NAME:  Roberto Ponce, MRN:  [id number], DOB:  12/30/1950, LOS: 1 ADMISSION DATE:  07/04/2021, CONSULTATION DATE:  07/04/2021 REFERRING MD:  Dr. 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  In evening of 07/04/21 he developed sudden onset of dyspnea associated with hypoxia with SpO2 in low 80s on room air.  He presented to ER and required Bipap.  Pertinent  Medical History  CAD, MVP with mod/severe MR, Testicular cancer s/p orchiectomy, Glaucoma, HLD  Significant Hospital Events:   5/16 Admit  Tests:  Echo 5/17 >> EF 40 to 45%, mild LVH, mild elevation in PASP, mild/mod MR, mild/mod TR Doppler legs b/l 5/17 >> acute DVT Rt popliteal, posterior tibial, peroneal and gastrocnemius veins  Interim History / Subjective:  Didn't need Bipap overnight.  Breathing better.  Denies chest pain, abdominal pain.  No leg pain or swelling.  Objective   Blood pressure (!) 142/64, pulse 89, temperature (!) 97.1 F (36.2 C), temperature source Axillary, resp. rate (!) 40, height '5\' 9"'$  (1.753 m), weight 66.1 kg, SpO2 100 %.        Intake/Output Summary (Last 24 hours) at 07/06/2021 7412 Last data filed at 07/06/2021 0449 Gross per 24 hour  Intake --  Output 2800 ml  Net -2800 ml   Filed Weights   07/05/21 0140 07/05/21 0300  Weight: 66.1 kg 66.1 kg    Examination:  General - alert Eyes - pupils reactive ENT - no sinus tenderness, no stridor Cardiac - regular rate/rhythm, no murmur Chest - better air movement, but still has crackles on Lt > Rt Abdomen - soft, non tender, + bowel sounds Extremities - no cyanosis, clubbing, or edema Skin - no rashes Neuro - normal strength, moves extremities, follows commands Psych - normal mood and behavior  Assessment & Plan:   Acute hypoxic  respiratory failure. - likely combination of acute pulmonary edema and recurrence of post-viral BOOP - improving on 5/18 - change continue solumedrol 80 mg q12h - goal SpO2 > 92% - can d/c Bipap - f/u CXR intermittently  Acute Rt leg DVT in setting of testicular cancer and recent hospitalization with decreased mobility. - continue lovenox for now, but will plan to transition to oral anticoagulation soon (most likely eliquis) - need to clarify with cardiology what combination of antiplatelet and anticoagulation he should be on - will need to get CT angiogram chest prior to discharge to assess for pulmonary embolism >> timing of this is flexible since it wouldn't change management at this time  Coronary artery disease s/p CABG, mod/severe MR, Acute on chronic systolic CHF, HLD. - followed by Dr.  Rudean Haskell - cardiology consulted - defer to cardiology whether he should remain on aspirin and plavix - continue coreg, farxiga, zetia, aldactone, lasix  Staph epidermidis in blood culture. - likely contaminate - defer antibiotics at this time  Hyponatremia from SIADH. - improving - fluid restrict - f/u BMET  Glaucoma. - continue eye drops  Hx of testicular cancer. - followed by Dr. Zola Button with oncology and Dr. Link Snuffer with [facility] - he is scheduled for surveillance CT imaging the first week of June; could do CT abd/pelvis while in hospital at same time he gets CT angiogram of his chest  Updated pt's wife at bedside.  Will transfer  to cardiac telemetry.  Best Practice (right click and "Reselect all SmartList Selections" daily)   Diet/type: Regular consistency (see orders) DVT prophylaxis: systemic dose LMWH GI prophylaxis: N/A Lines: N/A Foley:  N/A Code Status:  full code Last date of multidisciplinary goals of care discussion [updated pt's wife at bedside]  Labs       Latest Ref Rng & Units 07/06/2021    2:38 AM 07/04/2021   10:10 PM 06/28/2021     3:54 AM  CMP  Glucose 70 - 99 mg/dL 146   172   109    BUN 8 - 23 mg/dL 39   18   18    Creatinine 0.61 - 1.24 mg/dL 0.54   1.25   0.67    Sodium 135 - 145 mmol/L 129   123   130    Potassium 3.5 - 5.1 mmol/L 4.0   4.1   4.0    Chloride 98 - 111 mmol/L 93   86   94    CO2 22 - 32 mmol/L '27   28   27    '$ Calcium 8.9 - 10.3 mg/dL 8.4   8.2   8.2    Total Protein 6.5 - 8.1 g/dL  7.9   6.9    Total Bilirubin 0.3 - 1.2 mg/dL  0.9   1.0    Alkaline Phos 38 - 126 U/L  106   80    AST 15 - 41 U/L  47   39    ALT 0 - 44 U/L  73   100         Latest Ref Rng & Units 07/06/2021    2:38 AM 07/04/2021   10:10 PM 06/28/2021    3:54 AM  CBC  WBC 4.0 - 10.5 K/uL 16.2   13.2   13.2    Hemoglobin 13.0 - 17.0 g/dL 11.1   12.3   10.5    Hematocrit 39.0 - 52.0 % 32.2   35.5   31.5    Platelets 150 - 400 K/uL 397   506   305      ABG    Component Value Date/Time   PHART 7.46 (H) 07/04/2021 2220   PCO2ART 38 07/04/2021 2220   PO2ART 71 (L) 07/04/2021 2220   HCO3 27.0 07/04/2021 2220   TCO2 28 02/14/2021 1507   TCO2 27 02/14/2021 1507   ACIDBASEDEF 0.1 06/20/2021 0200   O2SAT 97 07/04/2021 2220    Signature:  Chesley Mires, MD Star Pager - [phone] - [phone] 07/06/2021, 8:12 AM

## 2021-07-06 NOTE — Progress Notes (Signed)
PHARMACY - PHYSICIAN COMMUNICATION CRITICAL VALUE ALERT - BLOOD CULTURE IDENTIFICATION (BCID)  Roberto Ponce is an 71 y.o. male who presented to [facility] on 07/04/2021 with a chief complaint of  dyspnea associated with hypoxia with SpO2 in low 80s on room air  Assessment:  GPC, 2/4, staph epi, MecA+  Name of physician (or Provider) Contacted: Sommer  Current antibiotics: none  Changes to prescribed antibiotics recommended:  none  Results for orders placed or performed during the hospital encounter of 07/04/21  Blood Culture ID Panel (Reflexed) (Collected: 07/05/2021  1:07 AM)  Result Value Ref Range   Enterococcus faecalis NOT DETECTED NOT DETECTED   Enterococcus Faecium NOT DETECTED NOT DETECTED   Listeria monocytogenes NOT DETECTED NOT DETECTED   Staphylococcus species DETECTED (A) NOT DETECTED   Staphylococcus aureus (BCID) NOT DETECTED NOT DETECTED   Staphylococcus epidermidis DETECTED (A) NOT DETECTED   Staphylococcus lugdunensis NOT DETECTED NOT DETECTED   Streptococcus species NOT DETECTED NOT DETECTED   Streptococcus agalactiae NOT DETECTED NOT DETECTED   Streptococcus pneumoniae NOT DETECTED NOT DETECTED   Streptococcus pyogenes NOT DETECTED NOT DETECTED   A.calcoaceticus-baumannii NOT DETECTED NOT DETECTED   Bacteroides fragilis NOT DETECTED NOT DETECTED   Enterobacterales NOT DETECTED NOT DETECTED   Enterobacter cloacae complex NOT DETECTED NOT DETECTED   Escherichia coli NOT DETECTED NOT DETECTED   Klebsiella aerogenes NOT DETECTED NOT DETECTED   Klebsiella oxytoca NOT DETECTED NOT DETECTED   Klebsiella pneumoniae NOT DETECTED NOT DETECTED   Proteus species NOT DETECTED NOT DETECTED   Salmonella species NOT DETECTED NOT DETECTED   Serratia marcescens NOT DETECTED NOT DETECTED   Haemophilus influenzae NOT DETECTED NOT DETECTED   Neisseria meningitidis NOT DETECTED NOT DETECTED   Pseudomonas aeruginosa NOT DETECTED NOT DETECTED   Stenotrophomonas maltophilia  NOT DETECTED NOT DETECTED   Candida albicans NOT DETECTED NOT DETECTED   Candida auris NOT DETECTED NOT DETECTED   Candida glabrata NOT DETECTED NOT DETECTED   Candida krusei NOT DETECTED NOT DETECTED   Candida parapsilosis NOT DETECTED NOT DETECTED   Candida tropicalis NOT DETECTED NOT DETECTED   Cryptococcus neoformans/gattii NOT DETECTED NOT DETECTED   Methicillin resistance mecA/C DETECTED (A) NOT DETECTED    Dolly Rias RPh 07/06/2021, 3:54 AM

## 2021-07-06 NOTE — Progress Notes (Addendum)
Pharmacy: Lovenox dose adjustment 5/17 doppler: acute RLE DVT,  Presumed PE  Wt on admit 73.9 kg, wt today 66.1   Plan: Lovenox 1 mg/kg = 65 mg sq q12h   Eudelia Bunch, Pharm.D 07/06/2021 10:59 AM

## 2021-07-07 DIAGNOSIS — J9601 Acute respiratory failure with hypoxia: Secondary | ICD-10-CM | POA: Diagnosis not present

## 2021-07-07 DIAGNOSIS — I5042 Chronic combined systolic (congestive) and diastolic (congestive) heart failure: Secondary | ICD-10-CM | POA: Diagnosis not present

## 2021-07-07 DIAGNOSIS — I2510 Atherosclerotic heart disease of native coronary artery without angina pectoris: Secondary | ICD-10-CM | POA: Diagnosis not present

## 2021-07-07 DIAGNOSIS — I340 Nonrheumatic mitral (valve) insufficiency: Secondary | ICD-10-CM | POA: Diagnosis not present

## 2021-07-07 DIAGNOSIS — I824Y1 Acute embolism and thrombosis of unspecified deep veins of right proximal lower extremity: Secondary | ICD-10-CM

## 2021-07-07 LAB — BASIC METABOLIC PANEL
Anion gap: 9 (ref 5–15)
BUN: 40 mg/dL — ABNORMAL HIGH (ref 8–23)
CO2: 29 mmol/L (ref 22–32)
Calcium: 8.4 mg/dL — ABNORMAL LOW (ref 8.9–10.3)
Chloride: 90 mmol/L — ABNORMAL LOW (ref 98–111)
Creatinine, Ser: 0.69 mg/dL (ref 0.61–1.24)
GFR, Estimated: >60 mL/min (ref >60)
Glucose, Bld: 143 mg/dL — ABNORMAL HIGH (ref 70–99)
Potassium: 4.5 mmol/L (ref 3.5–5.1)
Sodium: 128 mmol/L — ABNORMAL LOW (ref 135–145)

## 2021-07-07 LAB — CBC
HCT: 31 % — ABNORMAL LOW (ref 39.0–52.0)
Hemoglobin: 10.3 g/dL — ABNORMAL LOW (ref 13.0–17.0)
MCH: 29.5 pg (ref 26.0–34.0)
MCHC: 33.2 g/dL (ref 30.0–36.0)
MCV: 88.8 fL (ref 80.0–100.0)
Platelets: 440 10*3/uL — ABNORMAL HIGH (ref 150–400)
RBC: 3.49 MIL/uL — ABNORMAL LOW (ref 4.22–5.81)
RDW: 14.9 % (ref 11.5–15.5)
WBC: 23.3 10*3/uL — ABNORMAL HIGH (ref 4.0–10.5)
nRBC: 0 % (ref 0.0–0.2)

## 2021-07-07 MED ORDER — ENSURE ENLIVE PO LIQD
237.0000 mL | Freq: Two times a day (BID) | ORAL | Status: DC
  Administered 2021-07-10 – 2021-07-12 (×2): 237 mL via ORAL

## 2021-07-07 NOTE — Plan of Care (Signed)

## 2021-07-07 NOTE — Progress Notes (Signed)
PHARMACY NOTE -  Lovenox  Pharmacy has been assisting with dosing of enoxaparin for RLE DVT/PE. Dosage adjusted to 1 mg/kg SQ bid and further renal adjustments appear unlikely at present given improved SCr  Pharmacy will sign off, following peripherally for further dose adjustments. Please reconsult if a change in clinical status warrants re-evaluation of dosage.  Reuel Boom, PharmD, BCPS [phone] 07/07/2021, 11:16 AM

## 2021-07-07 NOTE — Care Management Important Message (Signed)
Important Message  Patient Details IM Letter given to the Patient. Name: Roberto Ponce MRN: [id number] Date of Birth: 1950-06-13   Medicare Important Message Given:  Yes     Kerin Salen 07/07/2021, 12:11 PM

## 2021-07-07 NOTE — TOC Progression Note (Signed)
Transition of Care [facility]) - Progression Note    Patient Details  Name: Roberto Ponce MRN: [id number] Date of Birth: [birth year]  [facility] CM/SW Contact  Purcell Mouton, RN Phone Number: 07/07/2021, 2:11 PM  Clinical Narrative:     Pt home with Spouse. TOC will continue to follow for [facility] needs.   Expected Discharge Plan: [place] Barriers to Discharge: No Barriers Identified  Expected Discharge Plan and Services Expected Discharge Plan: [place]   Discharge Planning Services: CM Consult Post Acute Care Choice: [place] arrangements for the past 2 months: Single Family Home                                       Social Determinants of Health (SDOH) Interventions    Readmission Risk Interventions     View : No data to display.

## 2021-07-07 NOTE — Progress Notes (Addendum)
NAME:  Roberto Ponce, MRN:  [id number], DOB:  [birth year], LOS: 2 ADMISSION DATE:  07/04/2021, CONSULTATION DATE:  07/04/2021 REFERRING MD:  Dr. 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  In evening of 07/04/21 he developed sudden onset of dyspnea associated with hypoxia with SpO2 in low 80s on room air.  He presented to ER and required Bipap. Work up consistent with acute RLE DVT.  Pertinent  Medical History  CAD, MVP with mod/severe MR, Testicular cancer s/p orchiectomy, Glaucoma, HLD  Significant Hospital Events:   5/16 Admit 5/18 Didn't need Bipap overnight.  Breathing better.   Tests:  Echo 5/17 >> EF 40 to 45%, mild LVH, mild elevation in PASP, mild/mod MR, mild/mod TR Doppler legs b/l 5/17 >> acute DVT Rt popliteal, posterior tibial, peroneal and gastrocnemius veins  Interim History / Subjective:  Patient reports feeling very tired, stayed up too late watching hockey game overnight  Wife at bedside Afebrile  On 3L [place]   Objective   Blood pressure (!) 116/57, pulse 95, temperature 98 F (36.7 C), temperature source Oral, resp. rate 20, height '5\' 9"'$  (1.753 m), weight 64.5 kg, SpO2 97 %.        Intake/Output Summary (Last 24 hours) at 07/07/2021 1524 Last data filed at 07/07/2021 1500 Gross per 24 hour  Intake 220 ml  Output 1480 ml  Net -1260 ml   Filed Weights   07/05/21 0140 07/05/21 0300 07/07/21 0500  Weight: 66.1 kg 66.1 kg 64.5 kg    Examination: General: cachectic adult male sitting up in chair in NAD  HEENT: MM pink/moist, anicteric, [place] O2  Neuro: Awake, alert, speech clear, MAE.  Appears tired.  CV: s1s2 RRR, no m/r/g PULM: non-labored at rest, crackles on left, good air entry on right  GI: soft, bsx4 active  Extremities: warm/dry, RLE >LLE (known  DVT)   Skin: no rashes or lesions  Assessment & Plan:   Acute hypoxic respiratory failure. Suspect combination of acute pulmonary edema and recurrence of post-viral BOOP -continue solumedrol 80 mg IV Q12, taper pending patient response > O2 needs improving  -wean O2 for sats >90% -follow intermittent CXR > slowly improving   Acute Rt leg DVT in setting of testicular cancer and recent hospitalization with decreased mobility. -continue full dose lovenox for now -plan for transition to oral anticoagulation soon > most likely eliquis -will need Cardiology to weigh in on combination of antiplatelet and DOAC he should be on  -assess CTA chest prior to discharge > timing flexible given no RV strain on ECHO and would not change plan of care   Coronary artery disease s/p CABG, mod/severe MR, Acute on chronic systolic CHF, HLD. Followed by Dr.  Rudean Haskell -appreciate Cardiology assistance with patient care  -defer to Cards re: if he should remain on ASA + Plavix -continue coreg, plavix, farxiga, zetia, lasix, spironolactone   Staph epidermidis in blood culture. 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 -fluid restriction  -follow sodium trend   Glaucoma. -continue eye drops   Hx of testicular cancer. Followed by Dr. Zola Button with oncology and Dr. Link Snuffer with [facility] -patient is scheduled for surveillance CT imaging the first week of June; could do CT abd/pelvis while in hospital at  same time he gets CT angiogram of his chest  Best Practice (right click and "Reselect all SmartList Selections" daily)  Diet/type: Regular consistency (see orders) DVT prophylaxis: systemic dose LMWH GI prophylaxis: N/A Lines: N/A Foley:  N/A Code Status:  full code Last date of multidisciplinary goals of care discussion: updated wife at bedside 5/19 on plan of care  To [facility] as of 5/20 for primary care.  PCCM will continue  to follow for pulmonary.   Labs       Latest Ref Rng & Units 07/07/2021    4:27 AM 07/06/2021    2:38 AM 07/04/2021   10:10 PM  CMP  Glucose 70 - 99 mg/dL 143   146   172    BUN 8 - 23 mg/dL 40   39   18    Creatinine 0.61 - 1.24 mg/dL 0.69   0.54   1.25    Sodium 135 - 145 mmol/L 128   129   123    Potassium 3.5 - 5.1 mmol/L 4.5   4.0   4.1    Chloride 98 - 111 mmol/L 90   93   86    CO2 22 - 32 mmol/L '29   27   28    '$ Calcium 8.9 - 10.3 mg/dL 8.4   8.4   8.2    Total Protein 6.5 - 8.1 g/dL   7.9    Total Bilirubin 0.3 - 1.2 mg/dL   0.9    Alkaline Phos 38 - 126 U/L   106    AST 15 - 41 U/L   47    ALT 0 - 44 U/L   73         Latest Ref Rng & Units 07/07/2021    4:27 AM 07/06/2021    2:38 AM 07/04/2021   10:10 PM  CBC  WBC 4.0 - 10.5 K/uL 23.3   16.2   13.2    Hemoglobin 13.0 - 17.0 g/dL 10.3   11.1   12.3    Hematocrit 39.0 - 52.0 % 31.0   32.2   35.5    Platelets 150 - 400 K/uL 440   397   506      ABG    Component Value Date/Time   PHART 7.46 (H) 07/04/2021 2220   PCO2ART 38 07/04/2021 2220   PO2ART 71 (L) 07/04/2021 2220   HCO3 27.0 07/04/2021 2220   TCO2 28 02/14/2021 1507   TCO2 27 02/14/2021 1507   ACIDBASEDEF 0.1 06/20/2021 0200   O2SAT 97 07/04/2021 2220    Signature:    Noe Gens, MSN, APRN, NP-C, AGACNP-BC [facility] 07/07/2021, 3:33 PM   Please see [email or web address] for pager details.   From 7A-7P if no response, please call [phone] After hours, please call ELink [phone]

## 2021-07-07 NOTE — Progress Notes (Signed)
I have seen and discussed patient with Roberto Gens NP.   We will continue high dose steroid treatment for suspected post-viral organizing pneumonia. He is to continue lovenox for DVT and concern for PE. We will monitor his left pleural effusion over the weekend and perform chest [place] if needed for consideration of thoracentesis.   Freda Jackson, MD [facility] Office: [phone]   See Amion for personal pager PCCM on call pager [phone] until 7pm. Please call Elink 7p-7a. [phone]

## 2021-07-07 NOTE — Progress Notes (Signed)
Progress Note  Patient Name: Roberto Ponce Date of Encounter: 07/07/2021  [facility] Cardiologist: Werner Lean, MD   Subjective   Continues to feel dyspneic with minimal ambulation.  Inpatient Medications    Scheduled Meds:  brimonidine  1 drop Left Eye BID   carvedilol  3.125 mg Oral BID WC   chlorhexidine  15 mL Mouth Rinse BID   Chlorhexidine Gluconate Cloth  6 each Topical Daily   clopidogrel  75 mg Oral Q breakfast   dapagliflozin propanediol  10 mg Oral Daily   dorzolamide-timolol  1 drop Both Eyes BID   enoxaparin (LOVENOX) injection  1 mg/kg Subcutaneous Q12H   ezetimibe  10 mg Oral Daily   furosemide  20 mg Oral Daily   latanoprost  1 drop Both Eyes QHS   methylPREDNISolone (SOLU-MEDROL) injection  80 mg Intravenous Q12H   spironolactone  12.5 mg Oral Daily   Continuous Infusions:  PRN Meds: docusate sodium, iohexol, ipratropium-albuterol, ondansetron (ZOFRAN) IV, polyethylene glycol   Vital Signs    Vitals:   07/06/21 2042 07/07/21 0241 07/07/21 0500 07/07/21 0631  BP: 117/66 128/75  133/71  Pulse: 93 85  88  Resp: '20 20  20  '$ Temp: (!) 97.5 F (36.4 C) 97.6 F (36.4 C)  97.9 F (36.6 C)  TempSrc: Oral Oral  Oral  SpO2: 97% 98%  96%  Weight:   64.5 kg   Height:        Intake/Output Summary (Last 24 hours) at 07/07/2021 1048 Last data filed at 07/07/2021 0600 Gross per 24 hour  Intake 100 ml  Output 1080 ml  Net -980 ml      07/07/2021    5:00 AM 07/05/2021    3:00 AM 07/05/2021    1:40 AM  Last 3 Weights  Weight (lbs) 142 lb 1.6 oz 145 lb 11.6 oz 145 lb 11.6 oz  Weight (kg) 64.456 kg 66.1 kg 66.1 kg      Telemetry    Sinus tachycardia with PVCs - Personally Reviewed  ECG    No new tracing - Personally Reviewed  Physical Exam   GEN: Frail, dyspneic with talking   Neck: No JVD Cardiac: Tachycardic, regular 2/6 systolic murmur Respiratory: Crackles at bases bilaterally, fair air movement GI: Soft, nontender,  non-distended  MS: No edema; No deformity. Neuro:  Nonfocal  Psych: Normal affect   Labs    High Sensitivity Troponin:   Recent Labs  Lab 06/20/21 1723 06/21/21 0500 07/04/21 2210 07/04/21 2350  TROPONINIHS 61* '16 8 10     '$ Chemistry Recent Labs  Lab 07/04/21 2210 07/06/21 0238 07/07/21 0427  NA 123* 129* 128*  K 4.1 4.0 4.5  CL 86* 93* 90*  CO2 '28 27 29  '$ GLUCOSE 172* 146* 143*  BUN 18 39* 40*  CREATININE 1.25* 0.54* 0.69  CALCIUM 8.2* 8.4* 8.4*  PROT 7.9  --   --   ALBUMIN 2.6*  --   --   AST 47*  --   --   ALT 73*  --   --   ALKPHOS 106  --   --   BILITOT 0.9  --   --   GFRNONAA >60 >60 >60  ANIONGAP '9 9 9    '$ Lipids No results for input(s): CHOL, TRIG, HDL, LABVLDL, LDLCALC, CHOLHDL in the last 168 hours.  Hematology Recent Labs  Lab 07/04/21 2210 07/06/21 0238 07/07/21 0427  WBC 13.2* 16.2* 23.3*  RBC 4.08* 3.69* 3.49*  HGB 12.3*  11.1* 10.3*  HCT 35.5* 32.2* 31.0*  MCV 87.0 87.3 88.8  MCH 30.1 30.1 29.5  MCHC 34.6 34.5 33.2  RDW 14.7 14.6 14.9  PLT 506* 397 440*   Thyroid No results for input(s): TSH, FREET4 in the last 168 hours.  BNP Recent Labs  Lab 07/04/21 2210  BNP 89.9    DDimer  Recent Labs  Lab 07/04/21 2210  DDIMER >20.00*     Radiology    DG Chest Port 1 View  Result Date: 07/06/2021 CLINICAL DATA:  Admission for respiratory failure EXAM: PORTABLE CHEST 1 VIEW COMPARISON:  Portable chest 07/04/2021. FINDINGS: 4:15 a.m., 07/06/2021. There are CABG changes mild cardiomegaly without central vascular prominence. Stable mediastinum with aortic tortuosity and atherosclerosis. There is a low inspiration on exam. Diffuse patchy interstitial and alveolar opacities of the left lung fields continue to be noted without improvement or worsening as well as patchy hazy opacities of the right mid to lower lung field which are much less dense. There has been no significant change in overall aeration. There is a small left pleural effusion, trace  right pleural effusion. Intact thoracic cage. IMPRESSION: 1. No change in left-greater-than-right interstitial and airspace disease and small left and minimal right pleural effusions. Cardiac size is stable. Electronically Signed   By: Telford Nab M.D.   On: 07/06/2021 07:34   VAS [place] LOWER EXTREMITY VENOUS (DVT)  Result Date: 07/05/2021  Lower Venous DVT Study Patient Name:  JACQUELINE DELAPENA  Date of Exam:   07/05/2021 Medical Rec #: [id number]            Accession #:    [id number] Date of Birth: 01/19/1951            Patient Gender: M Patient Age:   71 years Exam Location:  [facility] Procedure:      VAS [place] LOWER EXTREMITY VENOUS (DVT) Referring Phys: Collier Bullock --------------------------------------------------------------------------------  Indications: Hypoxia.  Limitations: Poor ultrasound/tissue interface and calcific shadowing. Comparison Study: Previous exam on 06/21/21 was negaitve for DVT. Performing Technologist: Rogelia Rohrer RVT, RDMS  Examination Guidelines: A complete evaluation includes B-mode imaging, spectral Doppler, color Doppler, and power Doppler as needed of all accessible portions of each vessel. Bilateral testing is considered an integral part of a complete examination. Limited examinations for reoccurring indications may be performed as noted. The reflux portion of the exam is performed with the patient in reverse Trendelenburg.  +---------+---------------+---------+-----------+----------+--------------+ RIGHT    CompressibilityPhasicitySpontaneityPropertiesThrombus Aging +---------+---------------+---------+-----------+----------+--------------+ CFV      Full           Yes      Yes                                 +---------+---------------+---------+-----------+----------+--------------+ SFJ      Full                                                        +---------+---------------+---------+-----------+----------+--------------+ FV Prox  Full            Yes      Yes                                 +---------+---------------+---------+-----------+----------+--------------+  FV Mid   Full           Yes      Yes                                 +---------+---------------+---------+-----------+----------+--------------+ FV DistalFull           Yes      Yes                                 +---------+---------------+---------+-----------+----------+--------------+ PFV      Full                                                        +---------+---------------+---------+-----------+----------+--------------+ POP      Partial        No       No                   Acute          +---------+---------------+---------+-----------+----------+--------------+ PTV      Partial        No       No                   Acute          +---------+---------------+---------+-----------+----------+--------------+ PERO     None           No       No                   Acute          +---------+---------------+---------+-----------+----------+--------------+ Gastroc  None           No       No                   Acute          +---------+---------------+---------+-----------+----------+--------------+   +---------+---------------+---------+-----------+----------+--------------+ LEFT     CompressibilityPhasicitySpontaneityPropertiesThrombus Aging +---------+---------------+---------+-----------+----------+--------------+ CFV      Full           Yes      Yes                                 +---------+---------------+---------+-----------+----------+--------------+ SFJ      Full                                                        +---------+---------------+---------+-----------+----------+--------------+ FV Prox  Full           Yes      Yes                                 +---------+---------------+---------+-----------+----------+--------------+ FV Mid   Full           Yes      Yes                                  +---------+---------------+---------+-----------+----------+--------------+  FV DistalFull           Yes      Yes                                 +---------+---------------+---------+-----------+----------+--------------+ PFV      Full                                                        +---------+---------------+---------+-----------+----------+--------------+ POP      Full           Yes      Yes                                 +---------+---------------+---------+-----------+----------+--------------+ PTV      Full                                                        +---------+---------------+---------+-----------+----------+--------------+ PERO     Full                                                        +---------+---------------+---------+-----------+----------+--------------+ Rouleaux noted in popliteal vein    Summary: BILATERAL: - No evidence of superficial venous thrombosis in the lower extremities, bilaterally. -No evidence of popliteal cyst, bilaterally. RIGHT: - Findings consistent with acute deep vein thrombosis involving the right popliteal vein, right posterior tibial veins, right peroneal veins, and right gastrocnemius veins.  LEFT: - There is no evidence of deep vein thrombosis in the lower extremity.  *See table(s) above for measurements and observations. Electronically signed by Jamelle Haring on 07/05/2021 at 9:22:24 PM.    Final     Cardiac Studies   LE Doppler 07/05/21:   Summary:  BILATERAL:  - No evidence of superficial venous thrombosis in the lower extremities,  bilaterally.  -No evidence of popliteal cyst, bilaterally.  RIGHT:  - Findings consistent with acute deep vein thrombosis involving the right  popliteal vein, right posterior tibial veins, right peroneal veins, and  right gastrocnemius veins.     LEFT:  - There is no evidence of deep vein thrombosis in the lower extremity.  Echo from 07/05/21:    1. Left ventricular  ejection fraction, by estimation, is 40 to 45%. The  left ventricle has mildly decreased function. The left ventricle  demonstrates regional wall motion abnormalities (see scoring  diagram/findings for description). There is mild  concentric left ventricular hypertrophy. Left ventricular diastolic  parameters are indeterminate. There is akinesis of the left ventricular,  basal-mid inferior wall. There is hypokinesis of the left ventricular,  mid-apical inferolateral wall and inferior   wall.   2. Right ventricular systolic function is normal. The right ventricular  size is normal. There is mildly elevated pulmonary artery systolic  pressure.   3. The mitral valve is abnormal. Mild to moderate mitral valve  regurgitation.   4. Tricuspid  valve regurgitation is mild to moderate.   5. The aortic valve is normal in structure. Aortic valve regurgitation is  not visualized. No aortic stenosis is present.   Comparison(s): No significant change from prior study. Prior images  reviewed side by side.    Echo from 06/20/21:   1. Left ventricular ejection fraction, by estimation, is 40 to 45%. The  left ventricle has mildly decreased function. The left ventricle  demonstrates regional wall motion abnormalities (see scoring  diagram/findings for description). There is mild left  ventricular hypertrophy. Left ventricular diastolic parameters are  consistent with Grade II diastolic dysfunction (pseudonormalization).  Elevated left atrial pressure.   2. Right ventricular systolic function is mildly reduced. The right  ventricular size is mildly enlarged. There is moderately elevated  pulmonary artery systolic pressure. The estimated right ventricular  systolic pressure is 63.7 mmHg.   3. Tricuspid valve regurgitation is mild to moderate.   4. The aortic valve is tricuspid. Aortic valve regurgitation is not  visualized. Aortic valve sclerosis is present, with no evidence of aortic  valve stenosis.    5. The inferior vena cava is dilated in size with <50% respiratory  variability, suggesting right atrial pressure of 15 mmHg.   6. The mitral valve is abnormal. Moderate mitral valve regurgitation.  Eccentric posterior directed MR jet. Difficult to quantify severity given  eccentric jet, but normal LA/LV size argues against severe MR. Consider  TEE or cardiac MRI for further  evaluation    Right and left heart cath from 02/14/21:   1.  Severe 3-vessel coronary artery disease with total occlusion of the LAD and total occlusion of the RCA, and severe stenosis of the native left circumflex 2.  Status post CABG with continued patency of the LIMA to LAD and RIMA to RCA 3.  Successful PCI of severe 95% stenosis of the mid circumflex, treated with a 3.0 x 15 mm Onyx frontier DES 4.  Severe mitral regurgitation by echo assessment, hemodynamics demonstrate low wedge pressure, no V wave, and no pulmonary hypertension.  Recommendations: DAPT with aspirin and clopidogrel x6 months, repeat echocardiogram in 6 months, defer transcatheter edge-to-edge mitral valve repair at present to reevaluate after PCI.  Patient Profile     71 y.o. 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 Notably, LE doppler showed acute right DVT. Cardiology is consulted for acute on chronic systolic heart failure exacerbation.  Assessment & Plan    #Acute hypoxic respiratory failure  - High suspicion for PE given acute onset dyspnea with new acute right LE DVT on LE doppler - Also with possible recurrence of post-viral BOOP - Does not appear grossly overloaded from HF standpoint -  Plan on CTA PE protocol to assess further    #Chronic systolic and diastolic heart failure  - Echo repeat 07/05/21 LVEF 40-45%, no acute change of MR  - Discharge weight 06/27/21 73.9kg, weight now 64.5kg - Appears euvolemic - Continue lasix '20mg'$  daily(was decreased from '40mg'$  due to hyponatremia) - GDMT: continue Coreg 3.'125mg'$  BID, spironolactone 12.'5mg'$ , farxiga '10MG'$  (not on losartan due to hx of hypotension), may add Entresto if BP allows    #Moderate to severe  MR  - Appears stable on repeat TTE - Follow-up as out-patient   #CAD with hx of CABG 1988 and PCI to mid Cx 01/2021 - Hs trop negative x2 - EKG no acute ischemic findings - No angina symptoms - Continue plavix '75mg'$  daily - Continue zetia '10mg'$  daily - Continue coreg 3.'125mg'$  BID   Acute DVT of right popliteal /posterior tibial Letitia Caul /gastrocnemius veins  - Noted on doppler 07/05/21 - Will need CTA PE protocol given high concern for PE - On therapeutic Lovenox     Hyponatremia - was felt due to SIADH in the setting of pneumonia last admission, Na was 130 at discharge on 06/28/21 and 123 on 07/04/21 this admission, now 128 after reducing lasix dosing to '20mg'$  daily   Testicular cancer - under surveillance, pending CT planned per urology       For questions or updates, please contact [place] Please consult [email or web address] for contact info under        Signed, Freada Bergeron, MD  07/07/2021, 10:48 AM

## 2021-07-08 DIAGNOSIS — I350 Nonrheumatic aortic (valve) stenosis: Secondary | ICD-10-CM

## 2021-07-08 DIAGNOSIS — Z8639 Personal history of other endocrine, nutritional and metabolic disease: Secondary | ICD-10-CM

## 2021-07-08 DIAGNOSIS — E871 Hypo-osmolality and hyponatremia: Secondary | ICD-10-CM

## 2021-07-08 DIAGNOSIS — I82409 Acute embolism and thrombosis of unspecified deep veins of unspecified lower extremity: Secondary | ICD-10-CM

## 2021-07-08 DIAGNOSIS — J9601 Acute respiratory failure with hypoxia: Secondary | ICD-10-CM | POA: Diagnosis not present

## 2021-07-08 DIAGNOSIS — Z8547 Personal history of malignant neoplasm of testis: Secondary | ICD-10-CM

## 2021-07-08 DIAGNOSIS — H409 Unspecified glaucoma: Secondary | ICD-10-CM

## 2021-07-08 LAB — CBC
HCT: 26 % — ABNORMAL LOW (ref 39.0–52.0)
HCT: 29 % — ABNORMAL LOW (ref 39.0–52.0)
Hemoglobin: 8.8 g/dL — ABNORMAL LOW (ref 13.0–17.0)
Hemoglobin: 9.6 g/dL — ABNORMAL LOW (ref 13.0–17.0)
MCH: 29.9 pg (ref 26.0–34.0)
MCH: 29.4 pg (ref 26.0–34.0)
MCHC: 33.8 g/dL (ref 30.0–36.0)
MCHC: 33.1 g/dL (ref 30.0–36.0)
MCV: 88.4 fL (ref 80.0–100.0)
MCV: 89 fL (ref 80.0–100.0)
Platelets: 386 10*3/uL (ref 150–400)
Platelets: 468 10*3/uL — ABNORMAL HIGH (ref 150–400)
RBC: 2.94 MIL/uL — ABNORMAL LOW (ref 4.22–5.81)
RBC: 3.26 MIL/uL — ABNORMAL LOW (ref 4.22–5.81)
RDW: 15.3 % (ref 11.5–15.5)
RDW: 15.5 % (ref 11.5–15.5)
WBC: 17.3 10*3/uL — ABNORMAL HIGH (ref 4.0–10.5)
WBC: 20.2 10*3/uL — ABNORMAL HIGH (ref 4.0–10.5)
nRBC: 0 % (ref 0.0–0.2)
nRBC: 0 % (ref 0.0–0.2)

## 2021-07-08 LAB — BASIC METABOLIC PANEL
Anion gap: 6 (ref 5–15)
BUN: 34 mg/dL — ABNORMAL HIGH (ref 8–23)
CO2: 31 mmol/L (ref 22–32)
Calcium: 8.4 mg/dL — ABNORMAL LOW (ref 8.9–10.3)
Chloride: 94 mmol/L — ABNORMAL LOW (ref 98–111)
Creatinine, Ser: 0.65 mg/dL (ref 0.61–1.24)
GFR, Estimated: >60 mL/min (ref >60)
Glucose, Bld: 132 mg/dL — ABNORMAL HIGH (ref 70–99)
Potassium: 4.3 mmol/L (ref 3.5–5.1)
Sodium: 131 mmol/L — ABNORMAL LOW (ref 135–145)

## 2021-07-08 LAB — CULTURE, BLOOD (ROUTINE X 2): Special Requests: ADEQUATE

## 2021-07-08 LAB — PHOSPHORUS: Phosphorus: 4 mg/dL (ref 2.5–4.6)

## 2021-07-08 LAB — MAGNESIUM: Magnesium: 2.3 mg/dL (ref 1.7–2.4)

## 2021-07-08 MED ORDER — PANTOPRAZOLE SODIUM 40 MG IV SOLR
40.0000 mg | Freq: Two times a day (BID) | INTRAVENOUS | Status: DC
  Administered 2021-07-08 – 2021-07-12 (×8): 40 mg via INTRAVENOUS
  Filled 2021-07-08 (×8): qty 10

## 2021-07-08 MED ORDER — GUAIFENESIN ER 600 MG PO TB12
600.0000 mg | ORAL_TABLET | Freq: Two times a day (BID) | ORAL | Status: DC
Start: 2021-07-08 — End: 2021-07-12
  Administered 2021-07-08 – 2021-07-12 (×9): 600 mg via ORAL
  Filled 2021-07-08 (×9): qty 1

## 2021-07-08 NOTE — Evaluation (Signed)
Clinical/Bedside Swallow Evaluation Patient Details  Name: Roberto Ponce MRN: [id number] Date of Birth: Nov 23, 1950  Today's Date: 07/08/2021 Time: SLP Start Time (ACUTE ONLY): 7253 SLP Stop Time (ACUTE ONLY): 0850 SLP Time Calculation (min) (ACUTE ONLY): 15 min  Past Medical History:  Past Medical History:  Diagnosis Date   Asthma    Coronary artery disease    Glaucoma    History of gallstones    Hyperlipidemia    Hypotension due to medication 06/22/2021   LVH (left ventricular hypertrophy)    MI (myocardial infarction) ([place])    MI, old    Mild aortic sclerosis    Mitral regurgitation    Mixed dyslipidemia    Shock ([place]) 06/22/2021   Venous stasis    Past Surgical History:  Past Surgical History:  Procedure Laterality Date   CARDIAC CATHETERIZATION  09/09/1986   EF 56%   CARDIOVASCULAR STRESS TEST  12/16/2007   EF 54% NO ISCHEMIA   CHOLECYSTECTOMY     CORONARY ARTERY BYPASS GRAFT     LIMA TO THE LAD AND RIMA TO RIGHT CORONARY ARTERY   CORONARY STENT INTERVENTION N/A 02/14/2021   Procedure: CORONARY STENT INTERVENTION;  Surgeon: Sherren Mocha, MD;  Location: [facility];  Service: Cardiovascular;  Laterality: N/A;   ORCHIECTOMY Right 04/19/2021   Procedure: RIGHT RADICAL INGUINAL ORCHIECTOMY;  Surgeon: Lucas Mallow, MD;  Location: [facility];  Service: Urology;  Laterality: Right;  1 HR FOR CASE   RIGHT/LEFT HEART CATH AND CORONARY/GRAFT ANGIOGRAPHY N/A 02/14/2021   Procedure: RIGHT/LEFT HEART CATH AND CORONARY/GRAFT ANGIOGRAPHY;  Surgeon: Sherren Mocha, MD;  Location: [facility];  Service: Cardiovascular;  Laterality: N/A;   TEE WITHOUT CARDIOVERSION N/A 09/02/2020   Procedure: TRANSESOPHAGEAL ECHOCARDIOGRAM (TEE);  Surgeon: Werner Lean, MD;  Location: [facility];  Service: Cardiovascular;  Laterality: N/A;   US ECHOCARDIOGRAPHY  12/12/2007   EF 55-60%   VASECTOMY  1988   HPI:  Patient is a 71 y.o. 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 He was readmitted 07/04/21 when he developed sudden onset of dyspnea associated with hypoxia with SpO2 in low 80%'s on RA, He presented to ER and required BiPAP. PMH: CAD, MVP with mod/severe MR, testicular cancer s/p orchiectomy, glaucoma, HLD. He was transitioned off BiPAP on 5/17. SLP swallow evaluation ordered on 5/19 due to respiratoruy fatigue with eating and concern for possible aspiration.    Assessment / Plan / Recommendation  Clinical Impression  Patient is not currently presenting with any clinical s/s of dysphagia as per this bedside/clinical swallow evaluation. He reports that at home and currently, he takes his time with eating and drinking and eats softer solids. He does not currently have his bottom partial dentures present and so this contributes to him needing to eat softer solids. He denies coughing, throat clearing, etc during PO intake and denies getting significantly SOB during meals. He reports he doesnt have a good appetite but he eats because he knows he needs to. Patient's voice was clear and strong throughout. SLP did not observe any overt s/s aspiration or penetration with sips of thin liquids. As patient is already managing his PO intake in relation to his respiratory status appropriately on his own and SLP not observing any significant s/s of dysphagia, no further SLP intervention recommended and patient appears safe with regular texture solids, thin liquids. SLP Visit Diagnosis: Dysphagia, unspecified (  R13.10)    Aspiration Risk  No limitations    Diet Recommendation Regular;Thin liquid   Liquid Administration via: Straw;Cup Medication Administration: Whole meds with liquid Supervision: Patient able to self feed Compensations: Small sips/bites;Slow rate Postural Changes: Seated upright at 90 degrees    Other  Recommendations Oral Care  Recommendations: Oral care BID;Patient independent with oral care    Recommendations for follow up therapy are one component of a multi-disciplinary discharge planning process, led by the attending physician.  Recommendations may be updated based on patient status, additional functional criteria and insurance authorization.  Follow up Recommendations No SLP follow up      Assistance Recommended at Discharge None  Functional Status Assessment Patient has had a recent decline in their functional status and demonstrates the ability to make significant improvements in function in a reasonable and predictable amount of time.  Frequency and Duration   N/A         Prognosis    N/A    Swallow Study   General Date of Onset: 07/07/21 HPI: Patient is a 71 y.o. 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 He was readmitted 07/04/21 when he developed sudden onset of dyspnea associated with hypoxia with SpO2 in low 80%'s on RA, He presented to ER and required BiPAP. PMH: CAD, MVP with mod/severe MR, testicular cancer s/p orchiectomy, glaucoma, HLD. He was transitioned off BiPAP on 5/17. SLP swallow evaluation ordered on 5/19 due to respiratoruy fatigue with eating and concern for possible aspiration. Type of Study: Bedside Swallow Evaluation Previous Swallow Assessment: none found Diet Prior to this Study: Regular;Thin liquids Temperature Spikes Noted: No Respiratory Status: Nasal cannula History of Recent Intubation: No Behavior/Cognition: Cooperative;Pleasant mood;Alert Oral Cavity Assessment: Within Functional Limits Oral Care Completed by SLP: No Oral Cavity - Dentition: Dentures, bottom;Adequate natural dentition;Other (Comment) (does not have partial dentures for bottom present here at hospital) Vision: Functional for self-feeding Self-Feeding Abilities: Able to feed self Patient  Positioning: Upright in bed Baseline Vocal Quality: Normal Volitional Cough: Strong Volitional Swallow: Able to elicit    Oral/Motor/Sensory Function Overall Oral Motor/Sensory Function: Within functional limits   Ice Chips     Thin Liquid Thin Liquid: Within functional limits Presentation: Straw;Self Fed    Nectar Thick     Honey Thick     Puree Puree: Not tested   Solid     Solid: Not tested      Sonia Baller, MA, CCC-SLP Speech Therapy

## 2021-07-08 NOTE — Progress Notes (Signed)
Progress Note  Patient Name: Roberto Ponce Date of Encounter: 07/08/2021  [facility] Cardiologist: Werner Lean, MD   Subjective  Still SOB  No CP .  Inpatient Medications    Scheduled Meds:  brimonidine  1 drop Left Eye BID   carvedilol  3.125 mg Oral BID WC   chlorhexidine  15 mL Mouth Rinse BID   clopidogrel  75 mg Oral Q breakfast   dapagliflozin propanediol  10 mg Oral Daily   dorzolamide-timolol  1 drop Both Eyes BID   enoxaparin (LOVENOX) injection  1 mg/kg Subcutaneous Q12H   ezetimibe  10 mg Oral Daily   feeding supplement  237 mL Oral BID BM   furosemide  20 mg Oral Daily   latanoprost  1 drop Both Eyes QHS   methylPREDNISolone (SOLU-MEDROL) injection  80 mg Intravenous Q12H   spironolactone  12.5 mg Oral Daily   Continuous Infusions:  PRN Meds: docusate sodium, iohexol, ipratropium-albuterol, ondansetron (ZOFRAN) IV, polyethylene glycol   Vital Signs    Vitals:   07/07/21 1334 07/07/21 2050 07/08/21 0500 07/08/21 0524  BP: (!) 116/57 (!) 117/59  (!) 110/55  Pulse: 95 95  77  Resp: '20 18  20  '$ Temp: 98 F (36.7 C) 98.1 F (36.7 C)  (!) 97.5 F (36.4 C)  TempSrc: Oral   Oral  SpO2: 97% 95%  99%  Weight:   65.4 kg   Height:        Intake/Output Summary (Last 24 hours) at 07/08/2021 0726 Last data filed at 07/08/2021 0525 Gross per 24 hour  Intake 220 ml  Output 1200 ml  Net -980 ml   ? Complete  Net negative 5.7 L      07/08/2021    5:00 AM 07/07/2021    5:00 AM 07/05/2021    3:00 AM  Last 3 Weights  Weight (lbs) 144 lb 1.6 oz 142 lb 1.6 oz 145 lb 11.6 oz  Weight (kg) 65.363 kg 64.456 kg 66.1 kg      Telemetry    SR with PVCs  - Personally Reviewed  ECG    No new tracing - Personally Reviewed  Physical Exam   GEN: Frail, dyspneic with talking   Neck: No JVD Cardiac: Tachycardic, regular 2/6 systolic murmur Respiratory: Decreased BS at L base  GI: Soft, nontender, non-distended  MS: No edema; No  deformity. Neuro:  Nonfocal  Psych: Normal affect   Labs    High Sensitivity Troponin:   Recent Labs  Lab 06/20/21 1723 06/21/21 0500 07/04/21 2210 07/04/21 2350  TROPONINIHS 61* '16 8 10     '$ Chemistry Recent Labs  Lab 07/04/21 2210 07/06/21 0238 07/07/21 0427 07/08/21 0425  NA 123* 129* 128* 131*  K 4.1 4.0 4.5 4.3  CL 86* 93* 90* 94*  CO2 '28 27 29 31  '$ GLUCOSE 172* 146* 143* 132*  BUN 18 39* 40* 34*  CREATININE 1.25* 0.54* 0.69 0.65  CALCIUM 8.2* 8.4* 8.4* 8.4*  PROT 7.9  --   --   --   ALBUMIN 2.6*  --   --   --   AST 47*  --   --   --   ALT 73*  --   --   --   ALKPHOS 106  --   --   --   BILITOT 0.9  --   --   --   GFRNONAA >60 >60 >60 >60  ANIONGAP '9 9 9 6    '$ Lipids No  results for input(s): CHOL, TRIG, HDL, LABVLDL, LDLCALC, CHOLHDL in the last 168 hours.  Hematology Recent Labs  Lab 07/06/21 0238 07/07/21 0427 07/08/21 0425  WBC 16.2* 23.3* 17.3*  RBC 3.69* 3.49* 2.94*  HGB 11.1* 10.3* 8.8*  HCT 32.2* 31.0* 26.0*  MCV 87.3 88.8 88.4  MCH 30.1 29.5 29.9  MCHC 34.5 33.2 33.8  RDW 14.6 14.9 15.3  PLT 397 440* 386   Thyroid No results for input(s): TSH, FREET4 in the last 168 hours.  BNP Recent Labs  Lab 07/04/21 2210  BNP 89.9    DDimer  Recent Labs  Lab 07/04/21 2210  DDIMER >20.00*     Radiology    No results found.  Cardiac Studies   LE Doppler 07/05/21:   Summary:  BILATERAL:  - No evidence of superficial venous thrombosis in the lower extremities,  bilaterally.  -No evidence of popliteal cyst, bilaterally.  RIGHT:  - Findings consistent with acute deep vein thrombosis involving the right  popliteal vein, right posterior tibial veins, right peroneal veins, and  right gastrocnemius veins.     LEFT:  - There is no evidence of deep vein thrombosis in the lower extremity.  Echo from 07/05/21:    1. Left ventricular ejection fraction, by estimation, is 40 to 45%. The  left ventricle has mildly decreased function. The left  ventricle  demonstrates regional wall motion abnormalities (see scoring  diagram/findings for description). There is mild  concentric left ventricular hypertrophy. Left ventricular diastolic  parameters are indeterminate. There is akinesis of the left ventricular,  basal-mid inferior wall. There is hypokinesis of the left ventricular,  mid-apical inferolateral wall and inferior   wall.   2. Right ventricular systolic function is normal. The right ventricular  size is normal. There is mildly elevated pulmonary artery systolic  pressure.   3. The mitral valve is abnormal. Mild to moderate mitral valve  regurgitation.   4. Tricuspid valve regurgitation is mild to moderate.   5. The aortic valve is normal in structure. Aortic valve regurgitation is  not visualized. No aortic stenosis is present.   Comparison(s): No significant change from prior study. Prior images  reviewed side by side.    Echo from 06/20/21:   1. Left ventricular ejection fraction, by estimation, is 40 to 45%. The  left ventricle has mildly decreased function. The left ventricle  demonstrates regional wall motion abnormalities (see scoring  diagram/findings for description). There is mild left  ventricular hypertrophy. Left ventricular diastolic parameters are  consistent with Grade II diastolic dysfunction (pseudonormalization).  Elevated left atrial pressure.   2. Right ventricular systolic function is mildly reduced. The right  ventricular size is mildly enlarged. There is moderately elevated  pulmonary artery systolic pressure. The estimated right ventricular  systolic pressure is 95.6 mmHg.   3. Tricuspid valve regurgitation is mild to moderate.   4. The aortic valve is tricuspid. Aortic valve regurgitation is not  visualized. Aortic valve sclerosis is present, with no evidence of aortic  valve stenosis.   5. The inferior vena cava is dilated in size with <50% respiratory  variability, suggesting right atrial  pressure of 15 mmHg.   6. The mitral valve is abnormal. Moderate mitral valve regurgitation.  Eccentric posterior directed MR jet. Difficult to quantify severity given  eccentric jet, but normal LA/LV size argues against severe MR. Consider  TEE or cardiac MRI for further  evaluation    Right and left heart cath from 02/14/21:   1.  Severe  3-vessel coronary artery disease with total occlusion of the LAD and total occlusion of the RCA, and severe stenosis of the native left circumflex 2.  Status post CABG with continued patency of the LIMA to LAD and RIMA to RCA 3.  Successful PCI of severe 95% stenosis of the mid circumflex, treated with a 3.0 x 15 mm Onyx frontier DES 4.  Severe mitral regurgitation by echo assessment, hemodynamics demonstrate low wedge pressure, no V wave, and no pulmonary hypertension.  Recommendations: DAPT with aspirin and clopidogrel x6 months, repeat echocardiogram in 6 months, defer transcatheter edge-to-edge mitral valve repair at present to reevaluate after PCI.  Patient Profile     71 y.o. 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 Notably, LE doppler showed acute right DVT. Cardiology is consulted for acute on chronic systolic heart failure exacerbation.  Assessment & Plan    #Acute hypoxic respiratory failure  - High suspicion for PE given acute onset dyspnea with new acute right LE DVT on LE doppler - Also with possible recurrence of post-viral BOOP   Pulmonary continuing high dose steroid Rx    - VOlume status overall pretty good  - Plan on CTA PE protocol to assess further    #Chronic systolic and diastolic  heart failure  - Echo repeat 07/05/21 LVEF 40-45%, no acute change of MR    RVEF is normal   - Discharge weight 06/27/21 73.9kg, weight now 64.5kg - Appears euvolemic - Continue lasix '20mg'$  daily(was decreased from '40mg'$  due to hyponatremia) - GDMT: continue Coreg 3.'125mg'$  BID, spironolactone 12.'5mg'$ , farxiga '10MG'$  (not on losartan due to hx of hypotension.  Caution with other meds No changes for now      #Moderate to severe MR  - I hae reviewed echoes  Very eccentric MR jet    Difficult to accurately grade severity    Similar to previous echo     - Follow-up as out-patient   #CAD with hx of CABG 1988 and PCI to mid Cx 01/2021 - Hs trop negative x2 - EKG no acute ischemic findings - No angina symptoms He is on Plavix since intervention in Dec 2022   Continue for right now given recent event  WIll review, possibly D/C  - Continue zetia '10mg'$  daily - Continue coreg 3.'125mg'$  BID   Acute DVT of right popliteal /posterior tibial Letitia Caul /gastrocnemius veins  - Noted on doppler 07/05/21 - Will need CTA PE protocol given high concern for PE - On therapeutic Lovenox     Hyponatremia - was felt due to SIADH in the setting of pneumonia last admission, Na was 130 at discharge on 06/28/21 and 123 on 07/04/21 this admission, now 128 after reducing lasix dosing to '20mg'$  daily   Testicular cancer - under surveillance, pending CT planned per urology       For questions or updates, please contact [place] Please consult [email or web address] for contact info under        Signed, Dorris Carnes, MD  07/08/2021, 7:26 AM

## 2021-07-08 NOTE — Evaluation (Signed)
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  The evening of 07/04/2021 he developed sudden onset of dyspnea associated with hypoxia with oxygen saturation as low as 80% on room air.  He presented to the ER and required BiPAP.  Work-up also consistent with acute right lower extremity DVT.   Clinical Impression   Roberto Ponce is a 71 year old man who presents with cardiopulmonary endurance deficits and poor activity tolerance. On evaluation he demonstrates good upper body strength but severely limited activity tolerance - limited to standing approx 30 seconds resulting in dyspnea and feelings of shortness of breath. O2 sat maintained in 90s He can perform ADLs in seated position or with short sit to stand needed for LB clothing management. Will follow acutely to improve activity tolerance and independence with ADLs.      Recommendations for follow up therapy are one component of a multi-disciplinary discharge planning process, led by the attending physician.  Recommendations may be updated based on patient status, additional functional criteria and insurance authorization.   Follow Up Recommendations  Home health OT    Assistance Recommended at Discharge Intermittent Supervision/Assistance  Patient can return home with the following Assistance with cooking/housework;Help with stairs or ramp for entrance;Assist for transportation    Functional Status Assessment  Patient has had a recent decline in their functional status and demonstrates the ability to make  significant improvements in function in a reasonable and predictable amount of time.  Equipment Recommendations  Tub/shower bench    Recommendations for Other Services       Precautions / Restrictions Precautions Precaution Comments: monito VS Restrictions Weight Bearing Restrictions: No      Mobility Bed Mobility                    Transfers Overall transfer level: Needs assistance   Transfers: Sit to/from Stand Sit to Stand: Supervision                  Balance Overall balance assessment: No apparent balance deficits (not formally assessed)                                         ADL either performed or assessed with clinical judgement   ADL Overall ADL's : Needs assistance/impaired Eating/Feeding: Independent   Grooming: Set up;Sitting   Upper Body Bathing: Sitting;Set up   Lower Body Bathing: Set up;Sitting/lateral leans   Upper Body Dressing : Set up;Sitting   Lower Body Dressing: Set up;Sit to/from stand   Toilet Transfer: Supervision/safety;BSC/3in1   Toileting- Water quality scientist and Hygiene: Supervision/safety;Sit to/from stand       Functional mobility during ADLs: Supervision/safety General ADL Comments: Overall supervision for ADLs and functional mobility. Limited by poor activity tolerance - o2 sat maintained in 90s but complaints of dyspnea. Needing seated positioning predominantly.     Vision Baseline Vision/History: 1 Wears glasses       Perception     Praxis      Pertinent Vitals/Pain Pain Assessment Pain Assessment: No/denies pain  Hand Dominance Right   Extremity/Trunk Assessment Upper Extremity Assessment Upper Extremity Assessment: RUE deficits/detail;LUE deficits/detail RUE Deficits / Details: WNL ROM, 5/5 strength RUE Sensation: WNL RUE Coordination: WNL LUE Deficits / Details: WNL ROM, 5/5 strength   Lower Extremity Assessment Lower Extremity Assessment: Overall WFL for tasks  assessed   Cervical / Trunk Assessment Cervical / Trunk Assessment: Normal   Communication Communication Communication: No difficulties   Cognition Arousal/Alertness: Awake/alert Behavior During Therapy: WFL for tasks assessed/performed Overall Cognitive Status: Within Functional Limits for tasks assessed                                       General Comments       Exercises     Shoulder Instructions      Home Living Family/patient expects to be discharged to:: Private residence Living Arrangements: Spouse/significant other Available Help at Discharge: Family Type of Home: House Home Access: Stairs to enter Technical brewer of Steps: 1   Home Layout: One level     Bathroom Shower/Tub: Teacher, early years/pre: Handicapped height     Home Equipment: Conservation officer, nature (2 wheels);Cane - single point          Prior Functioning/Environment Prior Level of Function : Independent/Modified Independent             Mobility Comments: went home with oxygen at 2-3 L          OT Problem List: Decreased strength;Decreased activity tolerance;Impaired balance (sitting and/or standing);Cardiopulmonary status limiting activity      OT Treatment/Interventions: Self-care/ADL training;Therapeutic exercise;Energy conservation;DME and/or AE instruction;Patient/family education;Therapeutic activities    OT Goals(Current goals can be found in the care plan section) Acute Rehab OT Goals Patient Stated Goal: to improve endurance OT Goal Formulation: With patient Time For Goal Achievement: 07/22/21 Potential to Achieve Goals: Good  OT Frequency: Min 2X/week    Co-evaluation              AM-PAC OT "6 Clicks" Daily Activity     Outcome Measure Help from another person eating meals?: None Help from another person taking care of personal grooming?: A Little Help from another person toileting, which includes using toliet, bedpan, or urinal?: A  Little Help from another person bathing (including washing, rinsing, drying)?: A Little Help from another person to put on and taking off regular upper body clothing?: A Little Help from another person to put on and taking off regular lower body clothing?: A Little 6 Click Score: 19   End of Session Equipment Utilized During Treatment: Oxygen Nurse Communication: Mobility status  Activity Tolerance: Patient tolerated treatment well Patient left: in chair;with call bell/phone within reach  OT Visit Diagnosis: Muscle weakness (generalized) (M62.81)                Time: 5465-0354 OT Time Calculation (min): 18 min Charges:  OT General Charges $OT Visit: 1 Visit OT Evaluation $OT Eval Low Complexity: 1 Low  Wilburt Messina, OTR/L [place]  Office [phone] Pager: [place] 07/08/2021, 4:05 PM

## 2021-07-08 NOTE — Plan of Care (Signed)

## 2021-07-08 NOTE — Progress Notes (Signed)
Nurse gave patient Protonix during first med pass. Wife of patient appeared anxious about this medication. Nurse educated patient and spouse about med. Wife mention the heart and lung doctor did not mentioned this med. Nurse will reach out to care team to see if during rounds the medication can be reviewed with the wife and patient again. No new orders at this time. Will continue to monitor.

## 2021-07-08 NOTE — Progress Notes (Addendum)
PROGRESS NOTE    Roberto Ponce  NTZ:[id number] DOB: 05-06-1950 DOA: 07/04/2021 PCP: Kristen Loader, FNP   Brief Narrative: 71 year old with past medical history significant for CAD, MVP with moderate to severe MR, testicular cancer s/p orchiectomy, glaucoma, hyperlipidemia, recent admission from 06/18/2021 until 06/28/2021 with respiratory failure from viral pneumonia with post Viral BOOP, acute pulmonary edema and hyponatremia from SIADH.  The evening of 07/04/2021 he developed sudden onset of dyspnea associated with hypoxia with oxygen saturation as low as 80% on room air.  He presented to the ER and required BiPAP.  Work-up also consistent with acute right lower extremity DVT.  Patient care transferred to [facility] 5/20  Assessment & Plan:   Principal Problem:   Acute hypoxemic respiratory failure (HCC) Active Problems:   Hyperlipidemia   Coronary artery disease   Aortic stenosis   Hyponatremia   Glaucoma   History of testicular cancer   History of SIADH   DVT (deep venous thrombosis) (HCC)   1-Acute hypoxic respiratory failure Thought to be secondary to acute pulmonary edema and recurrence of postviral BOOP. Continue with Solu-Medrol Continue to wean oxygen as tolerated Plan to repeat chest x-ray tomorrow to follow pleural effusion.  2-Acute right lower extremity DVT, in the setting of testicular cancer and recent hospitalization with decreased mobility -Continue With full dose Lovenox -Follow  cardiology recommendation in regards to combination antiplatelet and DOAC -Pulmonologist considering CTA prior to discharge.  Echo no evidence of RV strain  3-CAD status post CABG, moderate to severe MR, acute on chronic systolic heart failure, hyperlipidemia -Appreciate cardiology assistance -Defer to cardiology antiplatelet recommendation; patient is a on aspirin and Plavix -Continue with Coreg Plavix, Farxiga, Lasix and spironolactone   4-Staph epidermidis in blood  cultures: 2 out of 4 bottles: Suspected contaminant Plan to repeat blood cultures today  5-Hyponatremia from SIADH Continue with fluid restriction Continue with lasix.   6-Glaucoma: Continue with eyedrop 7-History of testicular cancer: Need to follow-up with Dr. Alen Blew  8-Leukocytosis; in setting steroid.   Addendum; per nurse report there was smear of tinge blood in bed pad. No Bloody stool.  -Plan to check CBC, transfuse as needed.  -Start IV protonix.  -Monitor for any GI bleed. He is on anticoagulation.     Estimated body mass index is 21.28 kg/m as calculated from the following:   Height as of this encounter: '5\' 9"'$  (1.753 m).   Weight as of this encounter: 65.4 kg.   DVT prophylaxis: Lovenox Code Status: Full code Family Communication: Discussed with wife who was at bedside Disposition Plan:  Status is: Inpatient Remains inpatient appropriate because: Management of respiratory failure.  PT OT consulted    Consultants:  Cardiology  CCM  Procedures:  ECHO  Antimicrobials:    Subjective: He still having shortness of breath on minimal exertion, just going to the bedside commode gets him short of breath. He reports cough but is not worse.  Objective: Vitals:   07/07/21 1334 07/07/21 2050 07/08/21 0500 07/08/21 0524  BP: (!) 116/57 (!) 117/59  (!) 110/55  Pulse: 95 95  77  Resp: '20 18  20  '$ Temp: 98 F (36.7 C) 98.1 F (36.7 C)  (!) 97.5 F (36.4 C)  TempSrc: Oral   Oral  SpO2: 97% 95%  99%  Weight:   65.4 kg   Height:        Intake/Output Summary (Last 24 hours) at 07/08/2021 0806 Last data filed at 07/08/2021 0525 Gross per 24 hour  Intake 220 ml  Output 1200 ml  Net -980 ml   Filed Weights   07/05/21 0300 07/07/21 0500 07/08/21 0500  Weight: 66.1 kg 64.5 kg 65.4 kg    Examination:  General exam: Appears calm and comfortable  Respiratory system: Clear to auscultation. Respiratory effort normal. Cardiovascular system: S1 & S2 heard, RRR.  No JVD, murmurs, rubs, gallops or clicks. No pedal edema. Gastrointestinal system: Abdomen is nondistended, soft and nontender. No organomegaly or masses felt. Normal bowel sounds heard. Central nervous system: Alert and oriented.  Extremities: Symmetric 5 x 5 power.    Data Reviewed: I have personally reviewed following labs and imaging studies  CBC: Recent Labs  Lab 07/04/21 2210 07/06/21 0238 07/07/21 0427 07/08/21 0425  WBC 13.2* 16.2* 23.3* 17.3*  NEUTROABS 10.6*  --   --   --   HGB 12.3* 11.1* 10.3* 8.8*  HCT 35.5* 32.2* 31.0* 26.0*  MCV 87.0 87.3 88.8 88.4  PLT 506* 397 440* 160   Basic Metabolic Panel: Recent Labs  Lab 07/04/21 2210 07/06/21 0238 07/07/21 0427 07/08/21 0425  NA 123* 129* 128* 131*  K 4.1 4.0 4.5 4.3  CL 86* 93* 90* 94*  CO2 '28 27 29 31  '$ GLUCOSE 172* 146* 143* 132*  BUN 18 39* 40* 34*  CREATININE 1.25* 0.54* 0.69 0.65  CALCIUM 8.2* 8.4* 8.4* 8.4*   GFR: Estimated Creatinine Clearance: 79.5 mL/min (by C-G formula based on SCr of 0.65 mg/dL). Liver Function Tests: Recent Labs  Lab 07/04/21 2210  AST 47*  ALT 73*  ALKPHOS 106  BILITOT 0.9  PROT 7.9  ALBUMIN 2.6*   No results for input(s): LIPASE, AMYLASE in the last 168 hours. No results for input(s): AMMONIA in the last 168 hours. Coagulation Profile: Recent Labs  Lab 07/04/21 2210  INR 1.1   Cardiac Enzymes: No results for input(s): CKTOTAL, CKMB, CKMBINDEX, TROPONINI in the last 168 hours. BNP (last 3 results) No results for input(s): PROBNP in the last 8760 hours. HbA1C: No results for input(s): HGBA1C in the last 72 hours. CBG: No results for input(s): GLUCAP in the last 168 hours. Lipid Profile: No results for input(s): CHOL, HDL, LDLCALC, TRIG, CHOLHDL, LDLDIRECT in the last 72 hours. Thyroid Function Tests: No results for input(s): TSH, T4TOTAL, FREET4, T3FREE, THYROIDAB in the last 72 hours. Anemia Panel: No results for input(s): VITAMINB12, FOLATE, FERRITIN, TIBC,  IRON, RETICCTPCT in the last 72 hours. Sepsis Labs: Recent Labs  Lab 07/04/21 2350 07/05/21 0250 07/06/21 0238  PROCALCITON <0.10 <0.10 <0.10    Recent Results (from the past 240 hour(s))  Resp Panel by RT-PCR (Flu A&B, Covid) Nasopharyngeal Swab     Status: None   Collection Time: 07/04/21 10:10 PM   Specimen: Nasopharyngeal Swab; Nasopharyngeal(NP) swabs in vial transport medium  Result Value Ref Range Status   SARS Coronavirus 2 by RT PCR NEGATIVE NEGATIVE Final    Comment: (NOTE) SARS-CoV-2 target nucleic acids are NOT DETECTED.  The SARS-CoV-2 RNA is generally detectable in upper respiratory specimens during the acute phase of infection. The lowest concentration of SARS-CoV-2 viral copies this assay can detect is 138 copies/mL. A negative result does not preclude SARS-Cov-2 infection and should not be used as the sole basis for treatment or other patient management decisions. A negative result may occur with  improper specimen collection/handling, submission of specimen other than nasopharyngeal swab, presence of viral mutation(s) within the areas targeted by this assay, and inadequate number of viral copies(<138 copies/mL). A negative result  must be combined with clinical observations, patient history, and epidemiological information. The expected result is Negative.  Fact Sheet for Patients:  EntrepreneurPulse.com.au  Fact Sheet for Healthcare Providers:  IncredibleEmployment.be  This test is no t yet approved or cleared by the [place] FDA and  has been authorized for detection and/or diagnosis of SARS-CoV-2 by FDA under an Emergency Use Authorization (EUA). This EUA will remain  in effect (meaning this test can be used) for the duration of the COVID-19 declaration under Section 564(b)(1) of the Act, 21 U.S.C.section 360bbb-3(b)(1), unless the authorization is terminated  or revoked sooner.       Influenza A by PCR  NEGATIVE NEGATIVE Final   Influenza B by PCR NEGATIVE NEGATIVE Final    Comment: (NOTE) The Xpert Xpress SARS-CoV-2/FLU/RSV plus assay is intended as an aid in the diagnosis of influenza from Nasopharyngeal swab specimens and should not be used as a sole basis for treatment. Nasal washings and aspirates are unacceptable for Xpert Xpress SARS-CoV-2/FLU/RSV testing.  Fact Sheet for Patients: EntrepreneurPulse.com.au  Fact Sheet for Healthcare Providers: IncredibleEmployment.be  This test is not yet approved or cleared by the [place] FDA and has been authorized for detection and/or diagnosis of SARS-CoV-2 by FDA under an Emergency Use Authorization (EUA). This EUA will remain in effect (meaning this test can be used) for the duration of the COVID-19 declaration under Section 564(b)(1) of the Act, 21 U.S.C. section 360bbb-3(b)(1), unless the authorization is terminated or revoked.  Performed at [facility], [place] [address] [place], [place] [address]   Culture, blood (Routine X 2) w Reflex to ID Panel     Status: None (Preliminary result)   Collection Time: 07/05/21  1:00 AM   Specimen: BLOOD  Result Value Ref Range Status   Specimen Description   Final    BLOOD BLOOD RIGHT WRIST Performed at [facility] [address] [place], [place] [address]    Special Requests   Final    BOTTLES DRAWN AEROBIC AND ANAEROBIC Blood Culture adequate volume Performed at [facility] [address], [place], [place] [address]    Culture   Final    NO GROWTH 2 DAYS Performed at [facility] [address] [place], [place] [address]    Report Status PENDING  Incomplete  Culture, blood (Routine X 2) w Reflex to ID Panel     Status: Abnormal (Preliminary result)   Collection Time: 07/05/21  1:07 AM   Specimen: BLOOD  Result Value Ref Range Status   Specimen Description   Final    BLOOD  LEFT ANTECUBITAL Performed at [facility] [address], [place], [place] [address]    Special Requests   Final    BOTTLES DRAWN AEROBIC AND ANAEROBIC Blood Culture adequate volume Performed at [facility] [address], [place], [place] [address]    Culture  Setup Time   Final    GRAM POSITIVE COCCI IN BOTH AEROBIC AND ANAEROBIC BOTTLES CRITICAL RESULT CALLED TO, READ BACK BY AND VERIFIED WITH: PHARMD ELLEN JACKSON 07/06/21'@3'$ :08 BY TW    Culture (A)  Final    STAPHYLOCOCCUS EPIDERMIDIS THE SIGNIFICANCE OF ISOLATING THIS ORGANISM FROM A SINGLE SET OF BLOOD CULTURES WHEN MULTIPLE SETS ARE DRAWN IS UNCERTAIN. PLEASE NOTIFY THE MICROBIOLOGY DEPARTMENT WITHIN ONE WEEK IF SPECIATION AND SENSITIVITIES ARE REQUIRED. Performed at [facility], [place] [address] [name], [place] [address]    Report Status  PENDING  Incomplete  Blood Culture ID Panel (Reflexed)     Status: Abnormal   Collection Time: 07/05/21  1:07 AM  Result Value Ref Range Status   Enterococcus faecalis NOT DETECTED NOT DETECTED Final   Enterococcus Faecium NOT DETECTED NOT DETECTED Final   Listeria monocytogenes NOT DETECTED NOT DETECTED Final   Staphylococcus species DETECTED (A) NOT DETECTED Final    Comment: CRITICAL RESULT CALLED TO, READ BACK BY AND VERIFIED WITH: PHARMD ELLEN JACKSON 07/06/21'@3'$ :08 BY TW    Staphylococcus aureus (BCID) NOT DETECTED NOT DETECTED Final   Staphylococcus epidermidis DETECTED (A) NOT DETECTED Final    Comment: Methicillin (oxacillin) resistant coagulase negative staphylococcus. Possible blood culture contaminant (unless isolated from more than one blood culture draw or clinical case suggests pathogenicity). No antibiotic treatment is indicated for blood  culture contaminants. CRITICAL RESULT CALLED TO, READ BACK BY AND VERIFIED WITH: PHARMD ELLEN JACKSON 07/06/21'@3'$ :08 BY TW    Staphylococcus lugdunensis NOT DETECTED NOT DETECTED Final    Streptococcus species NOT DETECTED NOT DETECTED Final   Streptococcus agalactiae NOT DETECTED NOT DETECTED Final   Streptococcus pneumoniae NOT DETECTED NOT DETECTED Final   Streptococcus pyogenes NOT DETECTED NOT DETECTED Final   A.calcoaceticus-baumannii NOT DETECTED NOT DETECTED Final   Bacteroides fragilis NOT DETECTED NOT DETECTED Final   Enterobacterales NOT DETECTED NOT DETECTED Final   Enterobacter cloacae complex NOT DETECTED NOT DETECTED Final   Escherichia coli NOT DETECTED NOT DETECTED Final   Klebsiella aerogenes NOT DETECTED NOT DETECTED Final   Klebsiella oxytoca NOT DETECTED NOT DETECTED Final   Klebsiella pneumoniae NOT DETECTED NOT DETECTED Final   Proteus species NOT DETECTED NOT DETECTED Final   Salmonella species NOT DETECTED NOT DETECTED Final   Serratia marcescens NOT DETECTED NOT DETECTED Final   Haemophilus influenzae NOT DETECTED NOT DETECTED Final   Neisseria meningitidis NOT DETECTED NOT DETECTED Final   Pseudomonas aeruginosa NOT DETECTED NOT DETECTED Final   Stenotrophomonas maltophilia NOT DETECTED NOT DETECTED Final   Candida albicans NOT DETECTED NOT DETECTED Final   Candida auris NOT DETECTED NOT DETECTED Final   Candida glabrata NOT DETECTED NOT DETECTED Final   Candida krusei NOT DETECTED NOT DETECTED Final   Candida parapsilosis NOT DETECTED NOT DETECTED Final   Candida tropicalis NOT DETECTED NOT DETECTED Final   Cryptococcus neoformans/gattii NOT DETECTED NOT DETECTED Final   Methicillin resistance mecA/C DETECTED (A) NOT DETECTED Final    Comment: CRITICAL RESULT CALLED TO, READ BACK BY AND VERIFIED WITH: PHARMD ELLEN JACKSON 07/06/21'@3'$ :08 BY TW Performed at [facility], [address], [place], [place] [address]   MRSA Next Gen by PCR, Nasal     Status: None   Collection Time: 07/06/21  9:50 PM   Specimen: Nasal Mucosa; Nasal Swab  Result Value Ref Range Status   MRSA by PCR Next Gen NOT DETECTED NOT DETECTED Final    Comment:  (NOTE) The GeneXpert MRSA Assay (FDA approved for NASAL specimens only), is one component of a comprehensive MRSA colonization surveillance program. It is not intended to diagnose MRSA infection nor to guide or monitor treatment for MRSA infections. Test performance is not FDA approved in patients less than 42 years old. Performed at [facility], [place] [address] [place],  [address]          Radiology Studies: No results found.      Scheduled Meds:  brimonidine  1 drop Left Eye BID   carvedilol  3.125 mg Oral BID WC  chlorhexidine  15 mL Mouth Rinse BID   clopidogrel  75 mg Oral Q breakfast   dapagliflozin propanediol  10 mg Oral Daily   dorzolamide-timolol  1 drop Both Eyes BID   enoxaparin (LOVENOX) injection  1 mg/kg Subcutaneous Q12H   ezetimibe  10 mg Oral Daily   feeding supplement  237 mL Oral BID BM   furosemide  20 mg Oral Daily   latanoprost  1 drop Both Eyes QHS   methylPREDNISolone (SOLU-MEDROL) injection  80 mg Intravenous Q12H   spironolactone  12.5 mg Oral Daily   Continuous Infusions:   LOS: 3 days    Time spent: 35 Minutes.     Elmarie Shiley, MD [facility]   If 7PM-7AM, please contact night-coverage [email or web address]  07/08/2021, 8:06 AM

## 2021-07-08 NOTE — Progress Notes (Signed)
Patient had a BM smear on his bed pad that had a bit of a blood tinge to it earlier, he has had a few BM smears since but those were brown w/ no blood/red color to it. Patient also had a large BM  today, per the NT it did not look bloody just kind of dark and his rectum area is kind of red/raw. Barrier cream applied and MD made aware.

## 2021-07-08 NOTE — Progress Notes (Signed)
NAME:  Roberto Ponce, MRN:  [id number], DOB:  1950/10/23, LOS: 3 ADMISSION DATE:  07/04/2021, CONSULTATION DATE:  07/04/2021 REFERRING MD:  Dr. 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  In evening of 07/04/21 he developed sudden onset of dyspnea associated with hypoxia with SpO2 in low 80s on room air.  He presented to ER and required Bipap. Work up consistent with acute RLE DVT.  Pertinent  Medical History  CAD, MVP with mod/severe MR, Testicular cancer s/p orchiectomy, Glaucoma, HLD  Significant Hospital Events:   5/16 Admit 5/18 Didn't need Bipap overnight.  Breathing better.   Tests:  Echo 5/17 >> EF 40 to 45%, mild LVH, mild elevation in PASP, mild/mod MR, mild/mod TR Doppler legs b/l 5/17 >> acute DVT Rt popliteal, posterior tibial, peroneal and gastrocnemius veins  Interim History / Subjective:   No acute events overnight He stood up from chair with significant decline in his SpO2, felt short of breath but no dizziness or light headedness On 3L [place]   Objective   Blood pressure (!) 110/55, pulse 77, temperature (!) 97.5 F (36.4 C), temperature source Oral, resp. rate 20, height '5\' 9"'$  (1.753 m), weight 65.4 kg, SpO2 99 %.        Intake/Output Summary (Last 24 hours) at 07/08/2021 1203 Last data filed at 07/08/2021 0920 Gross per 24 hour  Intake 340 ml  Output 1375 ml  Net -1035 ml   Filed Weights   07/05/21 0300 07/07/21 0500 07/08/21 0500  Weight: 66.1 kg 64.5 kg 65.4 kg    Examination: General: cachectic adult male sitting up in chair in NAD  HEENT: MM pink/moist, anicteric, [place] O2  Neuro: Awake, alert, speech clear, MAE.  Appears tired.  CV: s1s2 RRR, no m/r/g PULM: non-labored at rest, crackles on left, poor air movement GI: soft, bsx4 active   Extremities: warm/dry, RLE >LLE (known DVT)   Skin: no rashes or lesions  Assessment & Plan:   Acute hypoxic respiratory failure. Suspect combination of acute pulmonary edema and recurrence of post-viral BOOP -continue solumedrol 80 mg IV Q12, taper pending patient response > O2 needs improving  -wean O2 for sats >90% -follow intermittent CXR, repeat study tomorrow morning  Acute Rt leg DVT in setting of testicular cancer and recent hospitalization with decreased mobility. -continue full dose lovenox for now -plan for transition to oral anticoagulation soon > most likely eliquis -will need Cardiology to weigh in on combination of antiplatelet and DOAC he should be on  -assess CTA chest prior to discharge > timing flexible given no RV strain on ECHO and would not change plan of care   Coronary artery disease s/p CABG, mod/severe MR, Acute on chronic systolic CHF, HLD. Followed by Dr.  Rudean Haskell -appreciate Cardiology assistance with patient care  -defer to Cards re: if he should remain on ASA + Plavix -continue coreg, plavix, farxiga, zetia, lasix, spironolactone   Staph epidermidis in blood culture. 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 -fluid restriction  -follow sodium trend   Glaucoma. -continue eye drops   Hx of testicular cancer. Followed by Dr. Zola Button with oncology and Dr. Link Snuffer with [facility] -patient is scheduled for surveillance CT imaging the first week of June; could do CT  abd/pelvis while in hospital at same time he gets CT angiogram of his chest  Best Practice (right click and "Reselect all SmartList Selections" daily)  Diet/type: Regular consistency (see orders) DVT prophylaxis: systemic dose LMWH GI prophylaxis: N/A Lines: N/A Foley:  N/A Code Status:  full code Last date of multidisciplinary goals of care discussion: updated wife at bedside 5/19 on plan of care  To  [facility] as of 5/20 for primary care.  PCCM will continue to follow for pulmonary.   Labs       Latest Ref Rng & Units 07/08/2021    4:25 AM 07/07/2021    4:27 AM 07/06/2021    2:38 AM  CMP  Glucose 70 - 99 mg/dL 132   143   146    BUN 8 - 23 mg/dL 34   40   39    Creatinine 0.61 - 1.24 mg/dL 0.65   0.69   0.54    Sodium 135 - 145 mmol/L 131   128   129    Potassium 3.5 - 5.1 mmol/L 4.3   4.5   4.0    Chloride 98 - 111 mmol/L 94   90   93    CO2 22 - 32 mmol/L '31   29   27    '$ Calcium 8.9 - 10.3 mg/dL 8.4   8.4   8.4         Latest Ref Rng & Units 07/08/2021    4:25 AM 07/07/2021    4:27 AM 07/06/2021    2:38 AM  CBC  WBC 4.0 - 10.5 K/uL 17.3   23.3   16.2    Hemoglobin 13.0 - 17.0 g/dL 8.8   10.3   11.1    Hematocrit 39.0 - 52.0 % 26.0   31.0   32.2    Platelets 150 - 400 K/uL 386   440   397      ABG    Component Value Date/Time   PHART 7.46 (H) 07/04/2021 2220   PCO2ART 38 07/04/2021 2220   PO2ART 71 (L) 07/04/2021 2220   HCO3 27.0 07/04/2021 2220   TCO2 28 02/14/2021 1507   TCO2 27 02/14/2021 1507   ACIDBASEDEF 0.1 06/20/2021 0200   O2SAT 97 07/04/2021 2220    Signature:    Freda Jackson, MD [facility] Office: [phone]   See Amion for personal pager PCCM on call pager [phone] until 7pm. Please call Elink 7p-7a. [phone]

## 2021-07-09 ENCOUNTER — Inpatient Hospital Stay (HOSPITAL_COMMUNITY): Payer: PPO

## 2021-07-09 DIAGNOSIS — J9601 Acute respiratory failure with hypoxia: Secondary | ICD-10-CM | POA: Diagnosis not present

## 2021-07-09 LAB — CBC
HCT: 26.4 % — ABNORMAL LOW (ref 39.0–52.0)
HCT: 27.4 % — ABNORMAL LOW (ref 39.0–52.0)
HCT: 26.7 % — ABNORMAL LOW (ref 39.0–52.0)
HCT: 26.4 % — ABNORMAL LOW (ref 39.0–52.0)
Hemoglobin: 8.9 g/dL — ABNORMAL LOW (ref 13.0–17.0)
Hemoglobin: 9.1 g/dL — ABNORMAL LOW (ref 13.0–17.0)
Hemoglobin: 9.1 g/dL — ABNORMAL LOW (ref 13.0–17.0)
Hemoglobin: 8.7 g/dL — ABNORMAL LOW (ref 13.0–17.0)
MCH: 30 pg (ref 26.0–34.0)
MCH: 30.3 pg (ref 26.0–34.0)
MCH: 30.7 pg (ref 26.0–34.0)
MCH: 30.1 pg (ref 26.0–34.0)
MCHC: 33.7 g/dL (ref 30.0–36.0)
MCHC: 33.2 g/dL (ref 30.0–36.0)
MCHC: 34.1 g/dL (ref 30.0–36.0)
MCHC: 33 g/dL (ref 30.0–36.0)
MCV: 88.9 fL (ref 80.0–100.0)
MCV: 91.3 fL (ref 80.0–100.0)
MCV: 90.2 fL (ref 80.0–100.0)
MCV: 91.3 fL (ref 80.0–100.0)
Platelets: 421 10*3/uL — ABNORMAL HIGH (ref 150–400)
Platelets: 446 10*3/uL — ABNORMAL HIGH (ref 150–400)
Platelets: 439 10*3/uL — ABNORMAL HIGH (ref 150–400)
Platelets: 416 10*3/uL — ABNORMAL HIGH (ref 150–400)
RBC: 2.97 MIL/uL — ABNORMAL LOW (ref 4.22–5.81)
RBC: 3 MIL/uL — ABNORMAL LOW (ref 4.22–5.81)
RBC: 2.96 MIL/uL — ABNORMAL LOW (ref 4.22–5.81)
RBC: 2.89 MIL/uL — ABNORMAL LOW (ref 4.22–5.81)
RDW: 15.7 % — ABNORMAL HIGH (ref 11.5–15.5)
RDW: 15.8 % — ABNORMAL HIGH (ref 11.5–15.5)
RDW: 15.9 % — ABNORMAL HIGH (ref 11.5–15.5)
RDW: 16.3 % — ABNORMAL HIGH (ref 11.5–15.5)
WBC: 17.1 10*3/uL — ABNORMAL HIGH (ref 4.0–10.5)
WBC: 15.6 10*3/uL — ABNORMAL HIGH (ref 4.0–10.5)
WBC: 21 10*3/uL — ABNORMAL HIGH (ref 4.0–10.5)
WBC: 18.9 10*3/uL — ABNORMAL HIGH (ref 4.0–10.5)
nRBC: 0 % (ref 0.0–0.2)
nRBC: 0 % (ref 0.0–0.2)
nRBC: 0 % (ref 0.0–0.2)
nRBC: 0 % (ref 0.0–0.2)

## 2021-07-09 LAB — BASIC METABOLIC PANEL
Anion gap: 7 (ref 5–15)
BUN: 27 mg/dL — ABNORMAL HIGH (ref 8–23)
CO2: 30 mmol/L (ref 22–32)
Calcium: 8.4 mg/dL — ABNORMAL LOW (ref 8.9–10.3)
Chloride: 93 mmol/L — ABNORMAL LOW (ref 98–111)
Creatinine, Ser: 0.55 mg/dL — ABNORMAL LOW (ref 0.61–1.24)
GFR, Estimated: >60 mL/min (ref >60)
Glucose, Bld: 121 mg/dL — ABNORMAL HIGH (ref 70–99)
Potassium: 4.4 mmol/L (ref 3.5–5.1)
Sodium: 130 mmol/L — ABNORMAL LOW (ref 135–145)

## 2021-07-09 LAB — HEPARIN LEVEL (UNFRACTIONATED): Heparin Unfractionated: 0.65 IU/mL (ref 0.30–0.70)

## 2021-07-09 MED ORDER — HEPARIN (PORCINE) 25000 UT/250ML-% IV SOLN
950.0000 [IU]/h | INTRAVENOUS | Status: DC
  Administered 2021-07-09: 1200 [IU]/h via INTRAVENOUS
  Administered 2021-07-10: 1100 [IU]/h via INTRAVENOUS
  Filled 2021-07-09 (×3): qty 250

## 2021-07-09 MED ORDER — PREDNISONE 50 MG PO TABS
60.0000 mg | ORAL_TABLET | Freq: Every day | ORAL | Status: DC
  Administered 2021-07-10: 60 mg via ORAL
  Filled 2021-07-09: qty 1

## 2021-07-09 MED ORDER — SODIUM CHLORIDE 0.9 % IV SOLN: INTRAVENOUS | Status: DC

## 2021-07-09 MED ORDER — SODIUM CHLORIDE 0.9 % IV SOLN
2.0000 g | INTRAVENOUS | Status: DC
  Administered 2021-07-09 – 2021-07-11 (×3): 2 g via INTRAVENOUS
  Filled 2021-07-09 (×3): qty 20

## 2021-07-09 MED ORDER — METRONIDAZOLE 500 MG/100ML IV SOLN
500.0000 mg | Freq: Two times a day (BID) | INTRAVENOUS | Status: DC
  Administered 2021-07-09 – 2021-07-12 (×6): 500 mg via INTRAVENOUS
  Filled 2021-07-09 (×6): qty 100

## 2021-07-09 NOTE — Progress Notes (Signed)
PROGRESS NOTE    Roberto Ponce  YQI:[id number] DOB: [birth year] DOA: 07/04/2021 PCP: Kristen Loader, FNP   Brief Narrative: 71 year old with past medical history significant for CAD, MVP with moderate to severe MR, testicular cancer s/p orchiectomy, glaucoma, hyperlipidemia, recent admission from 06/18/2021 until 06/28/2021 with respiratory failure from viral pneumonia with post Viral BOOP, acute pulmonary edema and hyponatremia from SIADH.  The evening of 07/04/2021 he developed sudden onset of dyspnea associated with hypoxia with oxygen saturation as low as 80% on room air.  He presented to the ER and required BiPAP.  Work-up also consistent with acute right lower extremity DVT.  Patient care transferred to [facility] 5/20  Assessment & Plan:   Principal Problem:   Acute hypoxemic respiratory failure (HCC) Active Problems:   Hyperlipidemia   Coronary artery disease   Aortic stenosis   Hyponatremia   Glaucoma   History of testicular cancer   History of SIADH   DVT (deep venous thrombosis) (HCC)   1-Acute hypoxic respiratory failure Thought to be secondary to acute pulmonary edema and recurrence of postviral BOOP. Solu-Medrol-- change to prednisone.  Continue to wean oxygen as tolerated Plan to repeat chest x-ray tomorrow to follow pleural effusion.  2-Acute right lower extremity DVT, in the setting of testicular cancer and recent hospitalization with decreased mobility -Continue With full dose Lovenox -Follow  cardiology recommendation in regards to combination antiplatelet and DOAC -Pulmonologist considering CTA prior to discharge.  Echo no evidence of RV strain  3-CAD status post CABG, moderate to severe MR, acute on chronic systolic heart failure, hyperlipidemia -Appreciate cardiology assistance -Defer to cardiology antiplatelet recommendation; patient is a on aspirin and Plavix -Continue with Coreg Plavix, Farxiga, Lasix and spironolactone   4-Possible GI bleed; Had  [employer] stool mix with red dark blood: Had episode of brown stool mix with red dark blood.  Discussed with Dr Harrington Challenger, ok to hold plavix.  Continue with IV Protonix started last night Monitor CBC every 6 hours.  Discussed options with pulmonologist and patient: 1-Hold anticoagulation and consult IR for IVC filter. Or change Lovenox to heparin, monitor for bleeding. Plan to check CBC, GI consultation.  -Patient is more incline to continue with anticoagulation and see if he is bleeding a lot.  -he is due for Lovenox at 12 noon, will await for cbc results and  GI evaluation to start Heparin gtt.    Staph epidermidis in blood cultures: 2 out of 4 bottles: Suspected contaminant Repeated blood cultures 5/20: no growth to date.   -Hyponatremia from SIADH Continue with fluid restriction Continue with lasix.   -Glaucoma: Continue with eyedrop -History of testicular cancer: Need to follow-up with Dr. Alen Blew  -Leukocytosis; in setting steroid.      Estimated body mass index is 21.6 kg/m as calculated from the following:   Height as of this encounter: '5\' 9"'$  (1.753 m).   Weight as of this encounter: 66.3 kg.   DVT prophylaxis: Lovenox Code Status: Full code Family Communication: Discussed with wife who was at bedside Disposition Plan:  Status is: Inpatient Remains inpatient appropriate because: Management of respiratory failure.  PT OT consulted    Consultants:  Cardiology  CCM  Procedures:  ECHO  Antimicrobials:    Subjective: Had brown stool mix with red dark blood, unclear mount of blood.  He denies abdominal pain. He report feeling well. Denies worsening dyspnea.   Objective: Vitals:   07/09/21 0200 07/09/21 0300 07/09/21 0400 07/09/21 0500  BP:    Marland Kitchen)  109/57  Pulse:      Resp: '13 16 14 20  '$ Temp:    (!) 97.5 F (36.4 C)  TempSrc:    Oral  SpO2:      Weight:    66.3 kg  Height:        Intake/Output Summary (Last 24 hours) at 07/09/2021 5638 Last data filed at  07/09/2021 0500 Gross per 24 hour  Intake 640 ml  Output 1375 ml  Net -735 ml    Filed Weights   07/07/21 0500 07/08/21 0500 07/09/21 0500  Weight: 64.5 kg 65.4 kg 66.3 kg    Examination:  General exam: NAD Respiratory system: CTA Cardiovascular system: S 1, S 2 RRR Gastrointestinal system: BS present, soft, nt Central nervous system: alert and oriented.  Extremities: Symmetric power.     Data Reviewed: I have personally reviewed following labs and imaging studies  CBC: Recent Labs  Lab 07/04/21 2210 07/06/21 0238 07/07/21 0427 07/08/21 0425 07/08/21 1903 07/09/21 0330  WBC 13.2* 16.2* 23.3* 17.3* 20.2* 17.1*  NEUTROABS 10.6*  --   --   --   --   --   HGB 12.3* 11.1* 10.3* 8.8* 9.6* 8.9*  HCT 35.5* 32.2* 31.0* 26.0* 29.0* 26.4*  MCV 87.0 87.3 88.8 88.4 89.0 88.9  PLT 506* 397 440* 386 468* 421*    Basic Metabolic Panel: Recent Labs  Lab 07/04/21 2210 07/06/21 0238 07/07/21 0427 07/08/21 0425 07/08/21 0931 07/09/21 0330  NA 123* 129* 128* 131*  --  130*  K 4.1 4.0 4.5 4.3  --  4.4  CL 86* 93* 90* 94*  --  93*  CO2 '28 27 29 31  '$ --  30  GLUCOSE 172* 146* 143* 132*  --  121*  BUN 18 39* 40* 34*  --  27*  CREATININE 1.25* 0.54* 0.69 0.65  --  0.55*  CALCIUM 8.2* 8.4* 8.4* 8.4*  --  8.4*  MG  --   --   --   --  2.3  --   PHOS  --   --   --   --  4.0  --     GFR: Estimated Creatinine Clearance: 80.6 mL/min (A) (by C-G formula based on SCr of 0.55 mg/dL (L)). Liver Function Tests: Recent Labs  Lab 07/04/21 2210  AST 47*  ALT 73*  ALKPHOS 106  BILITOT 0.9  PROT 7.9  ALBUMIN 2.6*    No results for input(s): LIPASE, AMYLASE in the last 168 hours. No results for input(s): AMMONIA in the last 168 hours. Coagulation Profile: Recent Labs  Lab 07/04/21 2210  INR 1.1    Cardiac Enzymes: No results for input(s): CKTOTAL, CKMB, CKMBINDEX, TROPONINI in the last 168 hours. BNP (last 3 results) No results for input(s): PROBNP in the last 8760  hours. HbA1C: No results for input(s): HGBA1C in the last 72 hours. CBG: No results for input(s): GLUCAP in the last 168 hours. Lipid Profile: No results for input(s): CHOL, HDL, LDLCALC, TRIG, CHOLHDL, LDLDIRECT in the last 72 hours. Thyroid Function Tests: No results for input(s): TSH, T4TOTAL, FREET4, T3FREE, THYROIDAB in the last 72 hours. Anemia Panel: No results for input(s): VITAMINB12, FOLATE, FERRITIN, TIBC, IRON, RETICCTPCT in the last 72 hours. Sepsis Labs: Recent Labs  Lab 07/04/21 2350 07/05/21 0250 07/06/21 0238  PROCALCITON <0.10 <0.10 <0.10     Recent Results (from the past 240 hour(s))  Resp Panel by RT-PCR (Flu A&B, Covid) Nasopharyngeal Swab     Status: None  Collection Time: 07/04/21 10:10 PM   Specimen: Nasopharyngeal Swab; Nasopharyngeal(NP) swabs in vial transport medium  Result Value Ref Range Status   SARS Coronavirus 2 by RT PCR NEGATIVE NEGATIVE Final    Comment: (NOTE) SARS-CoV-2 target nucleic acids are NOT DETECTED.  The SARS-CoV-2 RNA is generally detectable in upper respiratory specimens during the acute phase of infection. The lowest concentration of SARS-CoV-2 viral copies this assay can detect is 138 copies/mL. A negative result does not preclude SARS-Cov-2 infection and should not be used as the sole basis for treatment or other patient management decisions. A negative result may occur with  improper specimen collection/handling, submission of specimen other than nasopharyngeal swab, presence of viral mutation(s) within the areas targeted by this assay, and inadequate number of viral copies(<138 copies/mL). A negative result must be combined with clinical observations, patient history, and epidemiological information. The expected result is Negative.  Fact Sheet for Patients:  EntrepreneurPulse.com.au  Fact Sheet for Healthcare Providers:  IncredibleEmployment.be  This test is no t yet approved or  cleared by the [place] FDA and  has been authorized for detection and/or diagnosis of SARS-CoV-2 by FDA under an Emergency Use Authorization (EUA). This EUA will remain  in effect (meaning this test can be used) for the duration of the COVID-19 declaration under Section 564(b)(1) of the Act, 21 U.S.C.section 360bbb-3(b)(1), unless the authorization is terminated  or revoked sooner.       Influenza A by PCR NEGATIVE NEGATIVE Final   Influenza B by PCR NEGATIVE NEGATIVE Final    Comment: (NOTE) The Xpert Xpress SARS-CoV-2/FLU/RSV plus assay is intended as an aid in the diagnosis of influenza from Nasopharyngeal swab specimens and should not be used as a sole basis for treatment. Nasal washings and aspirates are unacceptable for Xpert Xpress SARS-CoV-2/FLU/RSV testing.  Fact Sheet for Patients: EntrepreneurPulse.com.au  Fact Sheet for Healthcare Providers: IncredibleEmployment.be  This test is not yet approved or cleared by the [place] FDA and has been authorized for detection and/or diagnosis of SARS-CoV-2 by FDA under an Emergency Use Authorization (EUA). This EUA will remain in effect (meaning this test can be used) for the duration of the COVID-19 declaration under Section 564(b)(1) of the Act, 21 U.S.C. section 360bbb-3(b)(1), unless the authorization is terminated or revoked.  Performed at [facility], [place] [address], [place], [place] [address]   Culture, blood (Routine X 2) w Reflex to ID Panel     Status: None (Preliminary result)   Collection Time: 07/05/21  1:00 AM   Specimen: BLOOD  Result Value Ref Range Status   Specimen Description   Final    BLOOD BLOOD RIGHT WRIST Performed at [place] [address], [place], [place] [address]    Special Requests   Final    BOTTLES DRAWN AEROBIC AND ANAEROBIC Blood Culture adequate volume Performed at [place] [address] [place], [place] [address]    Culture   Final    NO GROWTH 3 DAYS Performed at [facility], [place] [address], [place],  [address]    Report Status PENDING  Incomplete  Culture, blood (Routine X 2) w Reflex to ID Panel     Status: Abnormal   Collection Time: 07/05/21  1:07 AM   Specimen: BLOOD  Result Value Ref Range Status   Specimen Description   Final    BLOOD LEFT ANTECUBITAL Performed at [place] [address] [place], [place]  [address]    Special Requests   Final    BOTTLES DRAWN AEROBIC AND ANAEROBIC Blood Culture adequate volume Performed at [place] [address] [place], [place] [address]    Culture  Setup Time   Final    GRAM POSITIVE COCCI IN BOTH AEROBIC AND ANAEROBIC BOTTLES CRITICAL RESULT CALLED TO, READ BACK BY AND VERIFIED WITH: PHARMD ELLEN JACKSON 07/06/21'@3'$ :08 BY TW    Culture (A)  Final    STAPHYLOCOCCUS EPIDERMIDIS THE SIGNIFICANCE OF ISOLATING THIS ORGANISM FROM A SINGLE SET OF BLOOD CULTURES WHEN MULTIPLE SETS ARE DRAWN IS UNCERTAIN. PLEASE NOTIFY THE MICROBIOLOGY DEPARTMENT WITHIN ONE WEEK IF SPECIATION AND SENSITIVITIES ARE REQUIRED. Performed at [facility], [place] [address] [place], [place] [address]    Report Status 07/08/2021 FINAL  Final  Blood Culture ID Panel (Reflexed)     Status: Abnormal   Collection Time: 07/05/21  1:07 AM  Result Value Ref Range Status   Enterococcus faecalis NOT DETECTED NOT DETECTED Final   Enterococcus Faecium NOT DETECTED NOT DETECTED Final   Listeria monocytogenes NOT DETECTED NOT DETECTED Final   Staphylococcus species DETECTED (A) NOT DETECTED Final    Comment: CRITICAL RESULT CALLED TO, READ BACK BY AND VERIFIED WITH: PHARMD ELLEN JACKSON 07/06/21'@3'$ :08 BY TW    Staphylococcus aureus (BCID) NOT DETECTED NOT DETECTED Final   Staphylococcus epidermidis DETECTED (A) NOT DETECTED Final    Comment: Methicillin (oxacillin) resistant  coagulase negative staphylococcus. Possible blood culture contaminant (unless isolated from more than one blood culture draw or clinical case suggests pathogenicity). No antibiotic treatment is indicated for blood  culture contaminants. CRITICAL RESULT CALLED TO, READ BACK BY AND VERIFIED WITH: PHARMD ELLEN JACKSON 07/06/21'@3'$ :08 BY TW    Staphylococcus lugdunensis NOT DETECTED NOT DETECTED Final   Streptococcus species NOT DETECTED NOT DETECTED Final   Streptococcus agalactiae NOT DETECTED NOT DETECTED Final   Streptococcus pneumoniae NOT DETECTED NOT DETECTED Final   Streptococcus pyogenes NOT DETECTED NOT DETECTED Final   A.calcoaceticus-baumannii NOT DETECTED NOT DETECTED Final   Bacteroides fragilis NOT DETECTED NOT DETECTED Final   Enterobacterales NOT DETECTED NOT DETECTED Final   Enterobacter cloacae complex NOT DETECTED NOT DETECTED Final   Escherichia coli NOT DETECTED NOT DETECTED Final   Klebsiella aerogenes NOT DETECTED NOT DETECTED Final   Klebsiella oxytoca NOT DETECTED NOT DETECTED Final   Klebsiella pneumoniae NOT DETECTED NOT DETECTED Final   Proteus species NOT DETECTED NOT DETECTED Final   Salmonella species NOT DETECTED NOT DETECTED Final   Serratia marcescens NOT DETECTED NOT DETECTED Final   Haemophilus influenzae NOT DETECTED NOT DETECTED Final   Neisseria meningitidis NOT DETECTED NOT DETECTED Final   Pseudomonas aeruginosa NOT DETECTED NOT DETECTED Final   Stenotrophomonas maltophilia NOT DETECTED NOT DETECTED Final   Candida albicans NOT DETECTED NOT DETECTED Final   Candida auris NOT DETECTED NOT DETECTED Final   Candida glabrata NOT DETECTED NOT DETECTED Final   Candida krusei NOT DETECTED NOT DETECTED Final   Candida parapsilosis NOT DETECTED NOT DETECTED Final   Candida tropicalis NOT DETECTED NOT DETECTED Final   Cryptococcus neoformans/gattii NOT DETECTED NOT DETECTED Final   Methicillin resistance mecA/C DETECTED (A) NOT DETECTED Final    Comment:  CRITICAL RESULT CALLED TO, READ BACK BY AND VERIFIED WITH: PHARMD ELLEN JACKSON 07/06/21'@3'$ :08 BY TW Performed at [facility], [address] [place], [place] [address]   MRSA Next Gen by PCR, Nasal     Status: None  Collection Time: 07/06/21  9:50 PM   Specimen: Nasal Mucosa; Nasal Swab  Result Value Ref Range Status   MRSA by PCR Next Gen NOT DETECTED NOT DETECTED Final    Comment: (NOTE) The GeneXpert MRSA Assay (FDA approved for NASAL specimens only), is one component of a comprehensive MRSA colonization surveillance program. It is not intended to diagnose MRSA infection nor to guide or monitor treatment for MRSA infections. Test performance is not FDA approved in patients less than 59 years old. Performed at [facility], [place] [address] [place], [place] [address]           Radiology Studies: No results found.      Scheduled Meds:  brimonidine  1 drop Left Eye BID   carvedilol  3.125 mg Oral BID WC   chlorhexidine  15 mL Mouth Rinse BID   clopidogrel  75 mg Oral Q breakfast   dapagliflozin propanediol  10 mg Oral Daily   dorzolamide-timolol  1 drop Both Eyes BID   enoxaparin (LOVENOX) injection  1 mg/kg Subcutaneous Q12H   ezetimibe  10 mg Oral Daily   feeding supplement  237 mL Oral BID BM   furosemide  20 mg Oral Daily   guaiFENesin  600 mg Oral BID   latanoprost  1 drop Both Eyes QHS   methylPREDNISolone (SOLU-MEDROL) injection  80 mg Intravenous Q12H   pantoprazole (PROTONIX) IV  40 mg Intravenous Q12H   spironolactone  12.5 mg Oral Daily   Continuous Infusions:   LOS: 4 days    Time spent: 35 Minutes.     Elmarie Shiley, MD [facility]   If 7PM-7AM, please contact night-coverage [email or web address]  07/09/2021, 7:07 AM

## 2021-07-09 NOTE — Progress Notes (Signed)
Pharmacy Consult Note - IV heparin follow up  Labs: heparin level 0.65  A/P: heparin level therapeutic (goal 0.3-0.7) on current heparin rate of 1200 units/hr. Per RN, no issues or bleeding noted. Continue current IV heparin rate of 1200 units/hr. Will recheck level with AM labs  Adrian Saran, PharmD, BCPS Secure Chat if ?s 07/09/2021 9:29 PM

## 2021-07-09 NOTE — Consult Note (Signed)
[facility] Consultation Note  Referring Provider: [facility] Primary Care Physician:  Kristen Loader, FNP  Reason for Consultation:  Blood in stool  HPI: Roberto Ponce is a 71 y.o. male protracted recent hospitalizations for CHF exacerbation, viral pneumonia with BOOP, DVT with suspected pulmonary embolism.  On Plavix.  Developed some dark stool with surrounding blood over the past day or two.  Patient remains short of breath.  No prior endoscopy.  Colonoscopy 2018 with Dr. Penelope Coop showed diverticulosis otherwise unrevealing.  No abdominal pain.  No hematemesis.   Past Medical History:  Diagnosis Date   Asthma    Coronary artery disease    Glaucoma    History of gallstones    Hyperlipidemia    Hypotension due to medication 06/22/2021   LVH (left ventricular hypertrophy)    MI (myocardial infarction) ([place])    MI, old    Mild aortic sclerosis    Mitral regurgitation    Mixed dyslipidemia    Shock ([place]) 06/22/2021   Venous stasis     Past Surgical History:  Procedure Laterality Date   CARDIAC CATHETERIZATION  09/09/1986   EF 56%   CARDIOVASCULAR STRESS TEST  12/16/2007   EF 54% NO ISCHEMIA   CHOLECYSTECTOMY     CORONARY ARTERY BYPASS GRAFT     LIMA TO THE LAD AND RIMA TO RIGHT CORONARY ARTERY   CORONARY STENT INTERVENTION N/A 02/14/2021   Procedure: CORONARY STENT INTERVENTION;  Surgeon: Sherren Mocha, MD;  Location: [facility];  Service: Cardiovascular;  Laterality: N/A;   ORCHIECTOMY Right 04/19/2021   Procedure: RIGHT RADICAL INGUINAL ORCHIECTOMY;  Surgeon: Lucas Mallow, MD;  Location: [facility];  Service: Urology;  Laterality: Right;  1 HR FOR CASE   RIGHT/LEFT HEART CATH AND CORONARY/GRAFT ANGIOGRAPHY N/A 02/14/2021   Procedure: RIGHT/LEFT HEART CATH AND CORONARY/GRAFT ANGIOGRAPHY;  Surgeon: Sherren Mocha, MD;  Location: [facility] [facility];  Service: Cardiovascular;  Laterality: N/A;   TEE WITHOUT CARDIOVERSION N/A 09/02/2020    Procedure: TRANSESOPHAGEAL ECHOCARDIOGRAM (TEE);  Surgeon: Werner Lean, MD;  Location: [facility];  Service: Cardiovascular;  Laterality: N/A;   US ECHOCARDIOGRAPHY  12/12/2007   EF 55-60%   VASECTOMY  1988    Prior to Admission medications   Medication Sig Start Date End Date Taking? Authorizing Provider  acetaminophen (TYLENOL) 500 MG tablet Take 1,000 mg by mouth every 6 (six) hours as needed for moderate pain.   Yes [provider]  aspirin EC 81 MG tablet Take 1 tablet (81 mg total) by mouth daily. 06/21/14  Yes Nahser, Wonda Cheng, MD  brimonidine (ALPHAGAN) 0.2 % ophthalmic solution Place 1 drop into the left eye 2 (two) times daily. 12/19/20  Yes [provider]  carvedilol (COREG) 3.125 MG tablet Take 1 tablet (3.125 mg total) by mouth 2 (two) times daily with a meal. 06/28/21  Yes Darrick Meigs, Marge Duncans, MD  clopidogrel (PLAVIX) 75 MG tablet Take 1 tablet (75 mg total) by mouth daily with breakfast. 02/15/21  Yes Sande Rives E, PA-C  dapagliflozin propanediol (FARXIGA) 10 MG TABS tablet Take 1 tablet (10 mg total) by mouth daily. 06/29/21  Yes Oswald Hillock, MD  dorzolamide-timolol (COSOPT) 22.3-6.8 MG/ML ophthalmic solution Place 1 drop into both eyes 2 (two) times daily.   Yes [provider]  ezetimibe (ZETIA) 10 MG tablet TAKE 1 TABLET(10 MG) BY MOUTH DAILY Patient taking differently: Take 10 mg by mouth daily. 04/16/19  Yes Nahser, Wonda Cheng, MD  furosemide (LASIX) 40 MG tablet Take 1 tablet (40 mg total) by mouth 2 (two) times daily. 06/28/21 10/26/21 Yes Lama, Marge Duncans, MD  latanoprost (XALATAN) 0.005 % ophthalmic solution Place 1 drop into both eyes at bedtime.  05/27/13  Yes [provider]  SODIUM FLUORIDE 5000 PPM 1.1 % PSTE Apply 1 application. topically in the morning and at bedtime. 05/17/21  Yes [provider]  spironolactone (ALDACTONE) 25 MG tablet Take 0.5 tablets (12.5 mg total) by mouth daily. 06/29/21  Yes Oswald Hillock, MD   triamcinolone cream (KENALOG) 0.1 % Apply 1 application topically 2 (two) times daily as needed (eczema).   Yes [provider]  nitroGLYCERIN (NITROSTAT) 0.4 MG SL tablet Place 1 tablet (0.4 mg total) under the tongue every 5 (five) minutes as needed for chest pain. Patient not taking: Reported on 07/05/2021 02/15/21 02/15/22  Darreld Mclean, PA-C    Current Facility-Administered Medications  Medication Dose Route Frequency Provider Last Rate Last Admin   brimonidine (ALPHAGAN) 0.2 % ophthalmic solution 1 drop  1 drop Left Eye BID Chesley Mires, MD   1 drop at 07/09/21 1117   carvedilol (COREG) tablet 3.125 mg  3.125 mg Oral BID WC Chesley Mires, MD   3.125 mg at 07/09/21 0900   chlorhexidine (PERIDEX) 0.12 % solution 15 mL  15 mL Mouth Rinse BID Chesley Mires, MD   15 mL at 07/09/21 1116   dapagliflozin propanediol (FARXIGA) tablet 10 mg  10 mg Oral Daily Chesley Mires, MD   10 mg at 07/09/21 1159   docusate sodium (COLACE) capsule 100 mg  100 mg Oral BID PRN Chesley Mires, MD   100 mg at 07/05/21 1002   dorzolamide-timolol (COSOPT) 22.3-6.8 MG/ML ophthalmic solution 1 drop  1 drop Both Eyes BID Chesley Mires, MD   1 drop at 07/09/21 1117   ezetimibe (ZETIA) tablet 10 mg  10 mg Oral Daily Chesley Mires, MD   10 mg at 07/09/21 1116   feeding supplement (ENSURE ENLIVE / ENSURE PLUS) liquid 237 mL  237 mL Oral BID BM Ollis, Brandi L, NP       furosemide (LASIX) tablet 20 mg  20 mg Oral Daily Chesley Mires, MD   20 mg at 07/09/21 1116   guaiFENesin (MUCINEX) 12 hr tablet 600 mg  600 mg Oral BID Regalado, Belkys A, MD   600 mg at 07/09/21 1116   heparin ADULT infusion 100 units/mL (25000 units/246m)  1,200 Units/hr Intravenous Continuous LArlyn DunningM, RPH       iohexol (OMNIPAQUE) 350 MG/ML injection 80 mL  80 mL Intravenous Once PRN SChesley Mires MD       ipratropium-albuterol (DUONEB) 0.5-2.5 (3) MG/3ML nebulizer solution 3 mL  3 mL Nebulization Q6H PRN Sood, Vineet, MD       latanoprost  (XALATAN) 0.005 % ophthalmic solution 1 drop  1 drop Both Eyes QHS SChesley Mires MD   1 drop at 07/08/21 2235   ondansetron (ZOFRAN) injection 4 mg  4 mg Intravenous Q6H PRN SChesley Mires MD       pantoprazole (PROTONIX) injection 40 mg  40 mg Intravenous Q12H Regalado, Belkys A, MD   40 mg at 07/09/21 1116   polyethylene glycol (MIRALAX / GLYCOLAX) packet 17 g  17 g Oral Daily PRN SChesley Mires MD       [Derrill MemoON 07/10/2021] predniSONE (DELTASONE) tablet 60 mg  60 mg Oral Q breakfast DFreddi Starr MD       spironolactone (  ALDACTONE) tablet 12.5 mg  12.5 mg Oral Daily Chesley Mires, MD   12.5 mg at 07/09/21 1116    Allergies as of 07/04/2021   (No Known Allergies)    Family History  Problem Relation Age of Onset   Heart attack Father    Stroke Father     Social History   Socioeconomic History   Marital status: Married    Spouse name: Not on file   Number of children: Not on file   Years of education: Not on file   Highest education level: Not on file  Occupational History   Not on file  Tobacco Use   Smoking status: Never   Smokeless tobacco: Never  Vaping Use   Vaping Use: Never used  Substance and Sexual Activity   Alcohol use: No   Drug use: No   Sexual activity: Not on file  Other Topics Concern   Not on file  Social History Narrative   Not on file   Social Determinants of Health   Financial Resource Strain: Not on file  Food Insecurity: Not on file  Transportation Needs: Not on file  Physical Activity: Not on file  Stress: Not on file  Social Connections: Not on file  Intimate Partner Violence: Not on file    Review of Systems: As per HPI, all others negative  Physical Exam: Vital signs in last 24 hours: Temp:  [97.5 F (36.4 C)] 97.5 F (36.4 C) (05/21 0500) Pulse Rate:  [83-86] 86 (05/20 2126) Resp:  [13-33] 23 (05/21 0600) BP: (109-122)/(57-59) 109/57 (05/21 0500) SpO2:  [95 %-100 %] 100 % (05/20 2126) Weight:  [66.3 kg] 66.3 kg (05/21  0500) Last BM Date : 07/08/21 General:   Alert, cachectic, deconditioned, tachypneic, dyspneic, acute on chronically ill-appearing Head:  Normocephalic and atraumatic. Eyes:  Sclera clear, no icterus.   Conjunctiva pink. Ears:  Normal auditory acuity. Nose:  No deformity, discharge,  or lesions. Mouth:  No deformity or lesions.  Oropharynx pink & moist. Neck:  Supple; no masses or thyromegaly. Lungs:  tachypneic at rest.   Abdomen:  Soft, nontender and nondistended. No masses, hepatosplenomegaly or hernias noted. Normal bowel sounds, without guarding, and without rebound.     Msk:  Symmetrical without gross deformities. Normal posture. Pulses:  Normal pulses noted. Extremities:  Without clubbing or edema. Neurologic:  Alert and  oriented x4; diffusely weak, otherwise grossly normal neurologically. Skin:  Intact without significant lesions or rashes. Psych:  Alert and cooperative. Normal mood and affect.   Lab Results: Recent Labs    07/08/21 1903 07/09/21 0330 07/09/21 1005  WBC 20.2* 17.1* 15.6*  HGB 9.6* 8.9* 9.1*  HCT 29.0* 26.4* 27.4*  PLT 468* 421* 446*   BMET Recent Labs    07/07/21 0427 07/08/21 0425 07/09/21 0330  NA 128* 131* 130*  K 4.5 4.3 4.4  CL 90* 94* 93*  CO2 '29 31 30  '$ GLUCOSE 143* 132* 121*  BUN 40* 34* 27*  CREATININE 0.69 0.65 0.55*  CALCIUM 8.4* 8.4* 8.4*   LFT No results for input(s): PROT, ALBUMIN, AST, ALT, ALKPHOS, BILITOT, BILIDIR, IBILI in the last 72 hours. PT/INR No results for input(s): LABPROT, INR in the last 72 hours.  Studies/Results: No results found.  Impression:   Recent viral pneumonia with BOOP. Significant mitral regurgitation with CHF. CAD. Anticoagulation. DVT, suspected PE. Hyponatremia with SIADH.  Plan:   Patient's respiratory status is at this point rather tenuous, and I feel risk of endoscopic/colonoscopic procedures is  not insignificant. Patient's degree of blood in stool reported seems rather scant.  In  light of this, and #1 above, I feel that it would be best to hold clopidigrel and start patient on IV heparin and monitor clinically for bleeding.  If significant bleeding is seen on heparin, then IVC filter +/- endoscopy/colonoscopy evaluation might be considered.  Otherwise, neither I nor patient nor his wife are inclined to pursue any type of GI evaluation at this time. PPI (pantoprazole 40 mg qd or equivalent). [facility] will follow.   LOS: 4 days   Scott Vanderveer M  07/09/2021, 12:12 PM  Cell [phone] If no answer or after 5 PM call [phone]

## 2021-07-09 NOTE — Progress Notes (Addendum)
NAME:  RAMIE ERMAN, MRN:  [id number], DOB:  1950/08/21, LOS: 4 ADMISSION DATE:  07/04/2021, CONSULTATION DATE:  07/04/2021 REFERRING MD:  Dr. 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  In evening of 07/04/21 he developed sudden onset of dyspnea associated with hypoxia with SpO2 in low 80s on room air.  He presented to ER and required Bipap. Work up consistent with acute RLE DVT.  Pertinent  Medical History  CAD, MVP with mod/severe MR, Testicular cancer s/p orchiectomy, Glaucoma, HLD  Significant Hospital Events:   5/16 Admit 5/18 Didn't need Bipap overnight.  Breathing better.   Tests:  Echo 5/17 >> EF 40 to 45%, mild LVH, mild elevation in PASP, mild/mod MR, mild/mod TR Doppler legs b/l 5/17 >> acute DVT Rt popliteal, posterior tibial, peroneal and gastrocnemius veins  Interim History / Subjective:   No acute events overnight Reports of blood tinged stool this morning, patient has history of hemorrhoids. He is feeling better today overall  Chest radiograph this morning shows left sided pneumomediastinum and otherwise improved bilateral interstitial opacities  Objective   Blood pressure (!) 109/57, pulse 86, temperature (!) 97.5 F (36.4 C), temperature source Oral, resp. rate (!) 23, height '5\' 9"'$  (1.753 m), weight 66.3 kg, SpO2 100 %.        Intake/Output Summary (Last 24 hours) at 07/09/2021 1034 Last data filed at 07/09/2021 0500 Gross per 24 hour  Intake 520 ml  Output 1200 ml  Net -680 ml   Filed Weights   07/07/21 0500 07/08/21 0500 07/09/21 0500  Weight: 64.5 kg 65.4 kg 66.3 kg    Examination: General: cachectic adult male sitting up in chair in NAD  HEENT: MM pink/moist, anicteric, [place] O2  Neuro: Awake, alert, speech clear, MAE.  Appears tired.  CV:  s1s2 RRR, no m/r/g PULM: non-labored at rest, crackles bilatearlly GI: soft, bsx4 active  Extremities: warm/dry, RLE >LLE (known DVT)   Skin: no rashes or lesions  Assessment & Plan:   Acute hypoxic respiratory failure. Pneumomediastinum Suspect combination of acute pulmonary edema and recurrence of post-viral BOOP and possible PE.  - Will transition from IV solumedrol '80mg'$  BID to prednisone '60mg'$  daily  -wean O2 for sats >90% -follow intermittent CXR, chest radiograph this AM appears improved in regards to bilateral infiltrates - Left sided pneumomediastinum is possibly from esophageal perforation vs bleb rupture of the lung. Patient is asymptomatic at this time. We will continue to monitor with daily chest radiograph. If progresses, CT surgery will need to be consulted. Consider esophogram for further evaluation of esophageal perforation.  Acute Rt leg DVT in setting of testicular cancer and recent hospitalization with decreased mobility. - will start heparin drip from lovenox given concern for GI bleeding. -will need Cardiology to weigh in on combination of antiplatelet and DOAC he should be on  -assess CTA chest prior to discharge > timing flexible given no RV strain on ECHO and would not change plan of care   Concern for GI Bleeding - patient with bloody stools reported 5/21 - antiplatelets have been held, ok per cardiology - will keep anticoagulation going with heparin per patient and wife as they want to hold off on IVC filter at this time.  - continue to monitor H/H and evidence of bleeding  Coronary artery disease  s/p CABG, mod/severe MR, Acute on chronic systolic CHF, HLD. Followed by Dr.  Rudean Haskell -appreciate Cardiology assistance with patient care  -defer to Cards re: if he should remain on ASA + Plavix -continue coreg, plavix, farxiga, zetia, lasix, spironolactone   Staph epidermidis in blood culture. -In 2/4 bottles, suspected contaminate, defer abx &  monitor clinically  Hyponatremia from SIADH. -fluid restriction  -follow sodium trend   Glaucoma. -continue eye drops   Hx of testicular cancer. Followed by Dr. Zola Button with oncology and Dr. Link Snuffer with [facility] -patient is scheduled for surveillance CT imaging the first week of June; could do CT abd/pelvis while in hospital at same time he gets CT angiogram of his chest  Best Practice (right click and "Reselect all SmartList Selections" daily)  Diet/type: Regular consistency (see orders) DVT prophylaxis: systemic heparin GI prophylaxis: N/A Lines: N/A Foley:  N/A Code Status:  full code Last date of multidisciplinary goals of care discussion: updated wife at bedside 5/19 on plan of care  Labs       Latest Ref Rng & Units 07/09/2021    3:30 AM 07/08/2021    4:25 AM 07/07/2021    4:27 AM  CMP  Glucose 70 - 99 mg/dL 121   132   143    BUN 8 - 23 mg/dL 27   34   40    Creatinine 0.61 - 1.24 mg/dL 0.55   0.65   0.69    Sodium 135 - 145 mmol/L 130   131   128    Potassium 3.5 - 5.1 mmol/L 4.4   4.3   4.5    Chloride 98 - 111 mmol/L 93   94   90    CO2 22 - 32 mmol/L '30   31   29    '$ Calcium 8.9 - 10.3 mg/dL 8.4   8.4   8.4         Latest Ref Rng & Units 07/09/2021    3:30 AM 07/08/2021    7:03 PM 07/08/2021    4:25 AM  CBC  WBC 4.0 - 10.5 K/uL 17.1   20.2   17.3    Hemoglobin 13.0 - 17.0 g/dL 8.9   9.6   8.8    Hematocrit 39.0 - 52.0 % 26.4   29.0   26.0    Platelets 150 - 400 K/uL 421   468   386      ABG    Component Value Date/Time   PHART 7.46 (H) 07/04/2021 2220   PCO2ART 38 07/04/2021 2220   PO2ART 71 (L) 07/04/2021 2220   HCO3 27.0 07/04/2021 2220   TCO2 28 02/14/2021 1507   TCO2 27 02/14/2021 1507   ACIDBASEDEF 0.1 06/20/2021 0200   O2SAT 97 07/04/2021 2220    Signature:    Freda Jackson, MD [place] Office: [phone]   See Amion for personal pager PCCM on call pager [phone] until 7pm. Please  call Elink 7p-7a. [phone]

## 2021-07-09 NOTE — Progress Notes (Addendum)
Progress Note  Patient Name: Roberto Ponce Date of Encounter: 07/09/2021  [facility] Cardiologist: Werner Lean, MD   Subjective  Patient denies CP  Breathing without significant change   Still SOB   Inpatient Medications    Scheduled Meds:  brimonidine  1 drop Left Eye BID   carvedilol  3.125 mg Oral BID WC   chlorhexidine  15 mL Mouth Rinse BID   clopidogrel  75 mg Oral Q breakfast   dapagliflozin propanediol  10 mg Oral Daily   dorzolamide-timolol  1 drop Both Eyes BID   enoxaparin (LOVENOX) injection  1 mg/kg Subcutaneous Q12H   ezetimibe  10 mg Oral Daily   feeding supplement  237 mL Oral BID BM   furosemide  20 mg Oral Daily   guaiFENesin  600 mg Oral BID   latanoprost  1 drop Both Eyes QHS   methylPREDNISolone (SOLU-MEDROL) injection  80 mg Intravenous Q12H   pantoprazole (PROTONIX) IV  40 mg Intravenous Q12H   spironolactone  12.5 mg Oral Daily   Continuous Infusions:  PRN Meds: docusate sodium, iohexol, ipratropium-albuterol, ondansetron (ZOFRAN) IV, polyethylene glycol   Vital Signs    Vitals:   07/09/21 0300 07/09/21 0400 07/09/21 0500 07/09/21 0600  BP:   (!) 109/57   Pulse:      Resp: '16 14 20 '$ (!) 23  Temp:   (!) 97.5 F (36.4 C)   TempSrc:   Oral   SpO2:      Weight:   66.3 kg   Height:        Intake/Output Summary (Last 24 hours) at 07/09/2021 0758 Last data filed at 07/09/2021 0500 Gross per 24 hour  Intake 640 ml  Output 1375 ml  Net -735 ml   ? Complete  Net negative 5.7 L      07/09/2021    5:00 AM 07/08/2021    5:00 AM 07/07/2021    5:00 AM  Last 3 Weights  Weight (lbs) 146 lb 4.1 oz 144 lb 1.6 oz 142 lb 1.6 oz  Weight (kg) 66.34 kg 65.363 kg 64.456 kg      Telemetry    SR with PVCs  - Personally Reviewed  ECG    No new tracing - Personally Reviewed  Physical Exam   GEN: Frail, dyspneic with talking   Neck: No JVD Cardiac: RRR   Gr 2/6 systolic murmur Respiratory: GI: Soft, nontender, non-distended   MS: No edema; No deformity. Neuro:  Nonfocal  Psych: Normal affect   Labs    High Sensitivity Troponin:   Recent Labs  Lab 06/20/21 1723 06/21/21 0500 07/04/21 2210 07/04/21 2350  TROPONINIHS 61* '16 8 10     '$ Chemistry Recent Labs  Lab 07/04/21 2210 07/06/21 0238 07/07/21 0427 07/08/21 0425 07/08/21 0931 07/09/21 0330  NA 123*   < > 128* 131*  --  130*  K 4.1   < > 4.5 4.3  --  4.4  CL 86*   < > 90* 94*  --  93*  CO2 28   < > 29 31  --  30  GLUCOSE 172*   < > 143* 132*  --  121*  BUN 18   < > 40* 34*  --  27*  CREATININE 1.25*   < > 0.69 0.65  --  0.55*  CALCIUM 8.2*   < > 8.4* 8.4*  --  8.4*  MG  --   --   --   --  2.3  --  PROT 7.9  --   --   --   --   --   ALBUMIN 2.6*  --   --   --   --   --   AST 47*  --   --   --   --   --   ALT 73*  --   --   --   --   --   ALKPHOS 106  --   --   --   --   --   BILITOT 0.9  --   --   --   --   --   GFRNONAA >60   < > >60 >60  --  >60  ANIONGAP 9   < > 9 6  --  7   < > = values in this interval not displayed.    Lipids No results for input(s): CHOL, TRIG, HDL, LABVLDL, LDLCALC, CHOLHDL in the last 168 hours.  Hematology Recent Labs  Lab 07/08/21 0425 07/08/21 1903 07/09/21 0330  WBC 17.3* 20.2* 17.1*  RBC 2.94* 3.26* 2.97*  HGB 8.8* 9.6* 8.9*  HCT 26.0* 29.0* 26.4*  MCV 88.4 89.0 88.9  MCH 29.9 29.4 30.0  MCHC 33.8 33.1 33.7  RDW 15.3 15.5 15.7*  PLT 386 468* 421*   Thyroid No results for input(s): TSH, FREET4 in the last 168 hours.  BNP Recent Labs  Lab 07/04/21 2210  BNP 89.9    DDimer  Recent Labs  Lab 07/04/21 2210  DDIMER >20.00*     Radiology    No results found.  Cardiac Studies   LE Doppler 07/05/21:   Summary:  BILATERAL:  - No evidence of superficial venous thrombosis in the lower extremities,  bilaterally.  -No evidence of popliteal cyst, bilaterally.  RIGHT:  - Findings consistent with acute deep vein thrombosis involving the right  popliteal vein, right posterior tibial  veins, right peroneal veins, and  right gastrocnemius veins.     LEFT:  - There is no evidence of deep vein thrombosis in the lower extremity.  Echo from 07/05/21:    1. Left ventricular ejection fraction, by estimation, is 40 to 45%. The  left ventricle has mildly decreased function. The left ventricle  demonstrates regional wall motion abnormalities (see scoring  diagram/findings for description). There is mild  concentric left ventricular hypertrophy. Left ventricular diastolic  parameters are indeterminate. There is akinesis of the left ventricular,  basal-mid inferior wall. There is hypokinesis of the left ventricular,  mid-apical inferolateral wall and inferior   wall.   2. Right ventricular systolic function is normal. The right ventricular  size is normal. There is mildly elevated pulmonary artery systolic  pressure.   3. The mitral valve is abnormal. Mild to moderate mitral valve  regurgitation.   4. Tricuspid valve regurgitation is mild to moderate.   5. The aortic valve is normal in structure. Aortic valve regurgitation is  not visualized. No aortic stenosis is present.   Comparison(s): No significant change from prior study. Prior images  reviewed side by side.    Echo from 06/20/21:   1. Left ventricular ejection fraction, by estimation, is 40 to 45%. The  left ventricle has mildly decreased function. The left ventricle  demonstrates regional wall motion abnormalities (see scoring  diagram/findings for description). There is mild left  ventricular hypertrophy. Left ventricular diastolic parameters are  consistent with Grade II diastolic dysfunction (pseudonormalization).  Elevated left atrial pressure.   2. Right ventricular systolic function is mildly  reduced. The right  ventricular size is mildly enlarged. There is moderately elevated  pulmonary artery systolic pressure. The estimated right ventricular  systolic pressure is 46.2 mmHg.   3. Tricuspid valve  regurgitation is mild to moderate.   4. The aortic valve is tricuspid. Aortic valve regurgitation is not  visualized. Aortic valve sclerosis is present, with no evidence of aortic  valve stenosis.   5. The inferior vena cava is dilated in size with <50% respiratory  variability, suggesting right atrial pressure of 15 mmHg.   6. The mitral valve is abnormal. Moderate mitral valve regurgitation.  Eccentric posterior directed MR jet. Difficult to quantify severity given  eccentric jet, but normal LA/LV size argues against severe MR. Consider  TEE or cardiac MRI for further  evaluation    Right and left heart cath from 02/14/21:   1.  Severe 3-vessel coronary artery disease with total occlusion of the LAD and total occlusion of the RCA, and severe stenosis of the native left circumflex 2.  Status post CABG with continued patency of the LIMA to LAD and RIMA to RCA 3.  Successful PCI of severe 95% stenosis of the mid circumflex, treated with a 3.0 x 15 mm Onyx frontier DES 4.  Severe mitral regurgitation by echo assessment, hemodynamics demonstrate low wedge pressure, no V wave, and no pulmonary hypertension.  Recommendations: DAPT with aspirin and clopidogrel x6 months, repeat echocardiogram in 6 months, defer transcatheter edge-to-edge mitral valve repair at present to reevaluate after PCI.  Patient Profile     71 y.o. 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 Notably, LE doppler showed acute right DVT. Cardiology is consulted for acute on chronic systolic heart failure exacerbation.  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   Pulmonary continuing high dose steroid Rx    PT says his breathing hasnt changed much   - VOlume remains good  - I would recomm CT chest to r/o PE sooner, esp with possible bleeding and need for antiplatelt Rx for stent        #Chronic systolic and diastolic heart failure  - Echo repeat 07/05/21 LVEF 40-45%, no acute change of MR    RVEF is normal   - Discharge weight 06/27/21 73.9kg, weight now 64.5kg - Volume ok   - Continue lasix '20mg'$  po daily(was decreased from '40mg'$  due to hyponatremia) - GDMT: continue Coreg 3.'125mg'$  BID, spironolactone 12.'5mg'$ , farxiga '10MG'$  (not on losartan due to hx of hypotension  # GI    Pt with some blood on stool  ? Amount  Patient's wife says not very much  Concern for LE bleeding    Now lovenox has been held and GI consulted.   When /if resume would use heparin Plavix has also been placed on hold (coronary intervened in Dec 2022)  He has not completed 6 months     Reassess after GI has seen resuming      #Moderate to severe MR  - I hae reviewed echoes  Very eccentric MR jet    Difficult to accurately grade severity    Similar to previous echo     - Follow-up as out-patient   #CAD with hx of CABG 1988 and PCI to mid Cx 01/2021 - Hs trop negative x2 - EKG no acute ischemic findings - No angina symptoms He is on Plavix since intervention in Dec 2022   Continue for right now given recent event  WIll review, possibly D/C  - Continue zetia '10mg'$  daily - Continue coreg 3.'125mg'$  BID   Acute DVT of right popliteal /posterior tibial Letitia Caul /gastrocnemius veins  - Noted on doppler 07/05/21 -  Lovenox on hold until evaluated by GI      Hyponatremia - Na is 130    Renal       Cr is 0.55 today      Testicular cancer - under surveillance, pending CT planned per urology       For questions or updates, please contact [facility] Please consult [email or web address] for contact info under        Signed, Dorris Carnes, MD  07/09/2021, 7:58 AM

## 2021-07-09 NOTE — Plan of Care (Signed)

## 2021-07-09 NOTE — Progress Notes (Signed)
ANTICOAGULATION CONSULT NOTE - Initial Consult  Pharmacy Consult for IV UFH (changed from [place]) Indication: pulmonary embolus  No Known Allergies  Patient Measurements: Height: '5\' 9"'$  (175.3 cm) Weight: 66.3 kg (146 lb 4.1 oz) IBW/kg (Calculated) : 70.7 Heparin Dosing Weight: 66 kg  Vital Signs: Temp: 97.5 F (36.4 C) (05/21 0500) Temp Source: Oral (05/21 0500) BP: 109/57 (05/21 0500)  Labs: Recent Labs    07/07/21 0427 07/08/21 0425 07/08/21 1903 07/09/21 0330 07/09/21 1005  HGB 10.3* 8.8* 9.6* 8.9* 9.1*  HCT 31.0* 26.0* 29.0* 26.4* 27.4*  PLT 440* 386 468* 421* 446*  CREATININE 0.69 0.65  --  0.55*  --     Estimated Creatinine Clearance: 80.6 mL/min (A) (by C-G formula based on SCr of 0.55 mg/dL (L)).   Medical History: Past Medical History:  Diagnosis Date   Asthma    Coronary artery disease    Glaucoma    History of gallstones    Hyperlipidemia    Hypotension due to medication 06/22/2021   LVH (left ventricular hypertrophy)    MI (myocardial infarction) ([place])    MI, old    Mild aortic sclerosis    Mitral regurgitation    Mixed dyslipidemia    Shock ([place]) 06/22/2021   Venous stasis     Medications:  Scheduled:   brimonidine  1 drop Left Eye BID   carvedilol  3.125 mg Oral BID WC   chlorhexidine  15 mL Mouth Rinse BID   dapagliflozin propanediol  10 mg Oral Daily   dorzolamide-timolol  1 drop Both Eyes BID   ezetimibe  10 mg Oral Daily   feeding supplement  237 mL Oral BID BM   furosemide  20 mg Oral Daily   guaiFENesin  600 mg Oral BID   latanoprost  1 drop Both Eyes QHS   pantoprazole (PROTONIX) IV  40 mg Intravenous Q12H   [START ON 07/10/2021] predniSONE  60 mg Oral Q breakfast   spironolactone  12.5 mg Oral Daily   Infusions:   Assessment: 71 yo on full dose Lovenox for PE, found to have possible GIB to change to IV heparin per Md orders for time being while final plan is worked out. Last Lovenox given 0022 5/21. Hgb 9.1.   Goal of Therapy:   Heparin level 0.3-0.7 units/ml Monitor platelets by anticoagulation protocol: Yes   Plan:  NO IV heparin bolus due to patient getting Lovenox  Start IV heparin at rate of 1200 units/hr.  Check heparin level 8 hours after start of IV heparin Daily CBC  Kara Mead 07/09/2021,11:40 AM

## 2021-07-09 NOTE — Progress Notes (Addendum)
PROGRESS NOTE    Roberto Ponce  DGL:[id number] DOB: 09/18/1950 DOA: 07/04/2021 PCP: Kristen Loader, FNP   Brief Narrative: 71 year old with past medical history significant for CAD, MVP with moderate to severe MR, testicular cancer s/p orchiectomy, glaucoma, hyperlipidemia, recent admission from 06/18/2021 until 06/28/2021 with respiratory failure from viral pneumonia with post Viral BOOP, acute pulmonary edema and hyponatremia from SIADH.  The evening of 07/04/2021 he developed sudden onset of dyspnea associated with hypoxia with oxygen saturation as low as 80% on room air.  He presented to the ER and required BiPAP.  Work-up also consistent with acute right lower extremity DVT.  Patient care transferred to [facility] 5/20  Assessment & Plan:   Principal Problem:   Acute hypoxemic respiratory failure (HCC) Active Problems:   Hyperlipidemia   Coronary artery disease   Aortic stenosis   Hyponatremia   Glaucoma   History of testicular cancer   History of SIADH   DVT (deep venous thrombosis) (HCC)   1-Acute hypoxic respiratory failure Thought to be secondary to acute pulmonary edema and recurrence of postviral BOOP. Solu-Medrol-- change to prednisone.  Continue to wean oxygen as tolerated Plan to repeat chest x-ray tomorrow to follow pleural effusion.  2-Acute right lower extremity DVT, in the setting of testicular cancer and recent hospitalization with decreased mobility -Continue With full dose Lovenox -Follow  cardiology recommendation in regards to combination antiplatelet and DOAC -Pulmonologist considering CTA prior to discharge.  Echo no evidence of RV strain  3-CAD status post CABG, moderate to severe MR, acute on chronic systolic heart failure, hyperlipidemia -Appreciate cardiology assistance -Defer to cardiology antiplatelet recommendation; patient is a on aspirin and Plavix -Continue with Coreg Plavix, Farxiga, Lasix and spironolactone   4-Possible GI bleed; Had  [employer] stool mix with red dark blood: Had episode of brown stool mix with red dark blood.  Discussed with Dr Harrington Challenger, ok to hold plavix.  Continue with IV Protonix started last night Monitor CBC every 6 hours.  Discussed options with pulmonologist and patient: 1-Hold anticoagulation and consult IR for IVC filter. Or change Lovenox to heparin, monitor for bleeding. Plan to check CBC, GI consultation.  -Patient is more incline to continue with anticoagulation and see if he is bleeding a lot.  -he is due for Lovenox at 12 noon, will await for cbc results and  GI evaluation to start Heparin gtt.   Pneumomediastinum:  -Differential small air way burst v s blebs burst vs esophageal perforation.  -Discussed with Pulmonologist, plan to repeat chest x ray in am. Start IV ceftriaxone and flagyl,  discussed with GI in regard esophagogram.  -Discussed with Dr Lidia Collum to hold on Esophagogram at this time, repeat chest x ray in am.  -NPO, sips with meds discussed with Dr Paulita Fujita.  -Per Dr Harrington Challenger cardiology--could do IV NS at 50 cc if NPO.   Staph epidermidis in blood cultures: 2 out of 4 bottles: Suspected contaminant Repeated blood cultures 5/20: no growth to date.   -Hyponatremia from SIADH Continue with fluid restriction Continue with lasix.   -Glaucoma: Continue with eyedrop -History of testicular cancer: Need to follow-up with Dr. Alen Blew  -Leukocytosis; in setting steroid.      Estimated body mass index is 21.6 kg/m as calculated from the following:   Height as of this encounter: '5\' 9"'$  (1.753 m).   Weight as of this encounter: 66.3 kg.   DVT prophylaxis: Lovenox Code Status: Full code Family Communication: Discussed with wife who was at bedside Disposition  Plan:  Status is: Inpatient Remains inpatient appropriate because: Management of respiratory failure.  PT OT consulted    Consultants:  Cardiology  CCM  Procedures:  ECHO  Antimicrobials:    Subjective: Had brown  stool mix with red dark blood, unclear mount of blood.  He denies abdominal pain. He report feeling well. Denies worsening dyspnea.   Objective: Vitals:   07/09/21 0300 07/09/21 0400 07/09/21 0500 07/09/21 0600  BP:   (!) 109/57   Pulse:      Resp: '16 14 20 '$ (!) 23  Temp:   (!) 97.5 F (36.4 C)   TempSrc:   Oral   SpO2:      Weight:   66.3 kg   Height:        Intake/Output Summary (Last 24 hours) at 07/09/2021 1314 Last data filed at 07/09/2021 1250 Gross per 24 hour  Intake 520 ml  Output 1600 ml  Net -1080 ml    Filed Weights   07/07/21 0500 07/08/21 0500 07/09/21 0500  Weight: 64.5 kg 65.4 kg 66.3 kg    Examination:  General exam: NAD Respiratory system: CTA Cardiovascular system: S 1, S 2 RRR Gastrointestinal system: BS present, soft, nt Central nervous system: alert and oriented.  Extremities: Symmetric power.     Data Reviewed: I have personally reviewed following labs and imaging studies  CBC: Recent Labs  Lab 07/04/21 2210 07/06/21 0238 07/07/21 0427 07/08/21 0425 07/08/21 1903 07/09/21 0330 07/09/21 1005  WBC 13.2*   < > 23.3* 17.3* 20.2* 17.1* 15.6*  NEUTROABS 10.6*  --   --   --   --   --   --   HGB 12.3*   < > 10.3* 8.8* 9.6* 8.9* 9.1*  HCT 35.5*   < > 31.0* 26.0* 29.0* 26.4* 27.4*  MCV 87.0   < > 88.8 88.4 89.0 88.9 91.3  PLT 506*   < > 440* 386 468* 421* 446*   < > = values in this interval not displayed.    Basic Metabolic Panel: Recent Labs  Lab 07/04/21 2210 07/06/21 0238 07/07/21 0427 07/08/21 0425 07/08/21 0931 07/09/21 0330  NA 123* 129* 128* 131*  --  130*  K 4.1 4.0 4.5 4.3  --  4.4  CL 86* 93* 90* 94*  --  93*  CO2 '28 27 29 31  '$ --  30  GLUCOSE 172* 146* 143* 132*  --  121*  BUN 18 39* 40* 34*  --  27*  CREATININE 1.25* 0.54* 0.69 0.65  --  0.55*  CALCIUM 8.2* 8.4* 8.4* 8.4*  --  8.4*  MG  --   --   --   --  2.3  --   PHOS  --   --   --   --  4.0  --     GFR: Estimated Creatinine Clearance: 80.6 mL/min (A) (by C-G  formula based on SCr of 0.55 mg/dL (L)). Liver Function Tests: Recent Labs  Lab 07/04/21 2210  AST 47*  ALT 73*  ALKPHOS 106  BILITOT 0.9  PROT 7.9  ALBUMIN 2.6*    No results for input(s): LIPASE, AMYLASE in the last 168 hours. No results for input(s): AMMONIA in the last 168 hours. Coagulation Profile: Recent Labs  Lab 07/04/21 2210  INR 1.1    Cardiac Enzymes: No results for input(s): CKTOTAL, CKMB, CKMBINDEX, TROPONINI in the last 168 hours. BNP (last 3 results) No results for input(s): PROBNP in the last 8760 hours. HbA1C: No results  for input(s): HGBA1C in the last 72 hours. CBG: No results for input(s): GLUCAP in the last 168 hours. Lipid Profile: No results for input(s): CHOL, HDL, LDLCALC, TRIG, CHOLHDL, LDLDIRECT in the last 72 hours. Thyroid Function Tests: No results for input(s): TSH, T4TOTAL, FREET4, T3FREE, THYROIDAB in the last 72 hours. Anemia Panel: No results for input(s): VITAMINB12, FOLATE, FERRITIN, TIBC, IRON, RETICCTPCT in the last 72 hours. Sepsis Labs: Recent Labs  Lab 07/04/21 2350 07/05/21 0250 07/06/21 0238  PROCALCITON <0.10 <0.10 <0.10     Recent Results (from the past 240 hour(s))  Resp Panel by RT-PCR (Flu A&B, Covid) Nasopharyngeal Swab     Status: None   Collection Time: 07/04/21 10:10 PM   Specimen: Nasopharyngeal Swab; Nasopharyngeal(NP) swabs in vial transport medium  Result Value Ref Range Status   SARS Coronavirus 2 by RT PCR NEGATIVE NEGATIVE Final    Comment: (NOTE) SARS-CoV-2 target nucleic acids are NOT DETECTED.  The SARS-CoV-2 RNA is generally detectable in upper respiratory specimens during the acute phase of infection. The lowest concentration of SARS-CoV-2 viral copies this assay can detect is 138 copies/mL. A negative result does not preclude SARS-Cov-2 infection and should not be used as the sole basis for treatment or other patient management decisions. A negative result may occur with  improper specimen  collection/handling, submission of specimen other than nasopharyngeal swab, presence of viral mutation(s) within the areas targeted by this assay, and inadequate number of viral copies(<138 copies/mL). A negative result must be combined with clinical observations, patient history, and epidemiological information. The expected result is Negative.  Fact Sheet for Patients:  EntrepreneurPulse.com.au  Fact Sheet for Healthcare Providers:  IncredibleEmployment.be  This test is no t yet approved or cleared by the [place] FDA and  has been authorized for detection and/or diagnosis of SARS-CoV-2 by FDA under an Emergency Use Authorization (EUA). This EUA will remain  in effect (meaning this test can be used) for the duration of the COVID-19 declaration under Section 564(b)(1) of the Act, 21 U.S.C.section 360bbb-3(b)(1), unless the authorization is terminated  or revoked sooner.       Influenza A by PCR NEGATIVE NEGATIVE Final   Influenza B by PCR NEGATIVE NEGATIVE Final    Comment: (NOTE) The Xpert Xpress SARS-CoV-2/FLU/RSV plus assay is intended as an aid in the diagnosis of influenza from Nasopharyngeal swab specimens and should not be used as a sole basis for treatment. Nasal washings and aspirates are unacceptable for Xpert Xpress SARS-CoV-2/FLU/RSV testing.  Fact Sheet for Patients: EntrepreneurPulse.com.au  Fact Sheet for Healthcare Providers: IncredibleEmployment.be  This test is not yet approved or cleared by the [place] FDA and has been authorized for detection and/or diagnosis of SARS-CoV-2 by FDA under an Emergency Use Authorization (EUA). This EUA will remain in effect (meaning this test can be used) for the duration of the COVID-19 declaration under Section 564(b)(1) of the Act, 21 U.S.C. section 360bbb-3(b)(1), unless the authorization is terminated or revoked.  Performed at [facility] [place] [facility], [place] [address], [place], [place] [address]   Culture, blood (Routine X 2) w Reflex to ID Panel     Status: None (Preliminary result)   Collection Time: 07/05/21  1:00 AM   Specimen: BLOOD  Result Value Ref Range Status   Specimen Description   Final    BLOOD BLOOD RIGHT WRIST Performed at [facility] [address] [place], [place] [address]    Special Requests   Final    BOTTLES  DRAWN AEROBIC AND ANAEROBIC Blood Culture adequate volume Performed at [facility] [address] [place], [place] [address]    Culture   Final    NO GROWTH 4 DAYS Performed at [facility], [place] [address], [place], [place] [address]    Report Status PENDING  Incomplete  Culture, blood (Routine X 2) w Reflex to ID Panel     Status: Abnormal   Collection Time: 07/05/21  1:07 AM   Specimen: BLOOD  Result Value Ref Range Status   Specimen Description   Final    BLOOD LEFT ANTECUBITAL Performed at [facility] [address], [place], [place] [address]    Special Requests   Final    BOTTLES DRAWN AEROBIC AND ANAEROBIC Blood Culture adequate volume Performed at [facility] [address] [place], [place] [address]    Culture  Setup Time   Final    GRAM POSITIVE COCCI IN BOTH AEROBIC AND ANAEROBIC BOTTLES CRITICAL RESULT CALLED TO, READ BACK BY AND VERIFIED WITH: PHARMD ELLEN JACKSON 07/06/21'@3'$ :08 BY TW    Culture (A)  Final    STAPHYLOCOCCUS EPIDERMIDIS THE SIGNIFICANCE OF ISOLATING THIS ORGANISM FROM A SINGLE SET OF BLOOD CULTURES WHEN MULTIPLE SETS ARE DRAWN IS UNCERTAIN. PLEASE NOTIFY THE MICROBIOLOGY DEPARTMENT WITHIN ONE WEEK IF SPECIATION AND SENSITIVITIES ARE REQUIRED. Performed at [facility], [place] [address] [place], [place] [address]    Report Status 07/08/2021 FINAL  Final  Blood Culture ID Panel (Reflexed)     Status: Abnormal   Collection Time: 07/05/21  1:07 AM   Result Value Ref Range Status   Enterococcus faecalis NOT DETECTED NOT DETECTED Final   Enterococcus Faecium NOT DETECTED NOT DETECTED Final   Listeria monocytogenes NOT DETECTED NOT DETECTED Final   Staphylococcus species DETECTED (A) NOT DETECTED Final    Comment: CRITICAL RESULT CALLED TO, READ BACK BY AND VERIFIED WITH: PHARMD ELLEN JACKSON 07/06/21'@3'$ :08 BY TW    Staphylococcus aureus (BCID) NOT DETECTED NOT DETECTED Final   Staphylococcus epidermidis DETECTED (A) NOT DETECTED Final    Comment: Methicillin (oxacillin) resistant coagulase negative staphylococcus. Possible blood culture contaminant (unless isolated from more than one blood culture draw or clinical case suggests pathogenicity). No antibiotic treatment is indicated for blood  culture contaminants. CRITICAL RESULT CALLED TO, READ BACK BY AND VERIFIED WITH: PHARMD ELLEN JACKSON 07/06/21'@3'$ :08 BY TW    Staphylococcus lugdunensis NOT DETECTED NOT DETECTED Final   Streptococcus species NOT DETECTED NOT DETECTED Final   Streptococcus agalactiae NOT DETECTED NOT DETECTED Final   Streptococcus pneumoniae NOT DETECTED NOT DETECTED Final   Streptococcus pyogenes NOT DETECTED NOT DETECTED Final   A.calcoaceticus-baumannii NOT DETECTED NOT DETECTED Final   Bacteroides fragilis NOT DETECTED NOT DETECTED Final   Enterobacterales NOT DETECTED NOT DETECTED Final   Enterobacter cloacae complex NOT DETECTED NOT DETECTED Final   Escherichia coli NOT DETECTED NOT DETECTED Final   Klebsiella aerogenes NOT DETECTED NOT DETECTED Final   Klebsiella oxytoca NOT DETECTED NOT DETECTED Final   Klebsiella pneumoniae NOT DETECTED NOT DETECTED Final   Proteus species NOT DETECTED NOT DETECTED Final   Salmonella species NOT DETECTED NOT DETECTED Final   Serratia marcescens NOT DETECTED NOT DETECTED Final   Haemophilus influenzae NOT DETECTED NOT DETECTED Final   Neisseria meningitidis NOT DETECTED NOT DETECTED Final   Pseudomonas aeruginosa NOT  DETECTED NOT DETECTED Final   Stenotrophomonas maltophilia NOT DETECTED NOT DETECTED Final   Candida albicans NOT DETECTED  NOT DETECTED Final   Candida auris NOT DETECTED NOT DETECTED Final   Candida glabrata NOT DETECTED NOT DETECTED Final   Candida krusei NOT DETECTED NOT DETECTED Final   Candida parapsilosis NOT DETECTED NOT DETECTED Final   Candida tropicalis NOT DETECTED NOT DETECTED Final   Cryptococcus neoformans/gattii NOT DETECTED NOT DETECTED Final   Methicillin resistance mecA/C DETECTED (A) NOT DETECTED Final    Comment: CRITICAL RESULT CALLED TO, READ BACK BY AND VERIFIED WITH: PHARMD ELLEN JACKSON 07/06/21'@3'$ :08 BY TW Performed at [facility] [address], [place], [place] [address]   MRSA Next Gen by PCR, Nasal     Status: None   Collection Time: 07/06/21  9:50 PM   Specimen: Nasal Mucosa; Nasal Swab  Result Value Ref Range Status   MRSA by PCR Next Gen NOT DETECTED NOT DETECTED Final    Comment: (NOTE) The GeneXpert MRSA Assay (FDA approved for NASAL specimens only), is one component of a comprehensive MRSA colonization surveillance program. It is not intended to diagnose MRSA infection nor to guide or monitor treatment for MRSA infections. Test performance is not FDA approved in patients less than 38 years old. Performed at [facility], [place] [address] [place], [place] [address]   Culture, blood (Routine X 2) w Reflex to ID Panel     Status: None (Preliminary result)   Collection Time: 07/08/21  9:31 AM   Specimen: BLOOD  Result Value Ref Range Status   Specimen Description   Final    BLOOD BLOOD LEFT HAND Performed at [facility] [address], [place], [place] [address]    Special Requests   Final    IN PEDIATRIC BOTTLE Blood Culture adequate volume Performed at [facility] [address], [place], [place] [address]    Culture   Final    NO GROWTH < 24 HOURS Performed at [facility] [address] [place], [place] [address]    Report Status PENDING  Incomplete  Culture, blood (Routine X 2) w Reflex to ID Panel     Status: None (Preliminary result)   Collection Time: 07/08/21 10:10 AM   Specimen: BLOOD  Result Value Ref Range Status   Specimen Description   Final    BLOOD BLOOD RIGHT HAND Performed at [facility] [address], [place], [place] [address]    Special Requests   Final    BOTTLES DRAWN AEROBIC AND ANAEROBIC Blood Culture results may not be optimal due to an inadequate volume of blood received in culture bottles Performed at [facility] [address] [place], [place] [address]    Culture   Final    NO GROWTH < 24 HOURS Performed at [facility] [address] [place], [place] [address]    Report Status PENDING  Incomplete          Radiology Studies: DG Chest 2 View  Result Date: 07/09/2021 CLINICAL DATA:  Shortness of breath. EXAM: CHEST - 2 VIEW COMPARISON:  07/06/2021 and prior studies FINDINGS: Pneumomediastinum is now identified, primarily on the LEFT. Improved airspace opacities within the LEFT lung noted. No definite pneumothorax is noted. The cardiomediastinal silhouette is otherwise unchanged with evidence of median sternotomy. IMPRESSION: 1. Pneumomediastinum, primarily on the LEFT. No definite pneumothorax. 2. Improved LEFT lung airspace opacities. These results will be called to the ordering clinician or representative by the Radiologist Assistant, and communication documented in the PACS  or [employer]. Electronically Signed   By: Margarette Canada M.D.   On: 07/09/2021 12:19        Scheduled Meds:  brimonidine  1 drop Left Eye BID   carvedilol  3.125 mg Oral BID WC   chlorhexidine  15 mL Mouth Rinse BID   dapagliflozin propanediol  10 mg Oral Daily   dorzolamide-timolol  1 drop Both Eyes BID   ezetimibe  10 mg Oral Daily   feeding supplement  237 mL Oral BID BM    furosemide  20 mg Oral Daily   guaiFENesin  600 mg Oral BID   latanoprost  1 drop Both Eyes QHS   pantoprazole (PROTONIX) IV  40 mg Intravenous Q12H   [START ON 07/10/2021] predniSONE  60 mg Oral Q breakfast   spironolactone  12.5 mg Oral Daily   Continuous Infusions:  cefTRIAXone (ROCEPHIN)  IV     heparin 1,200 Units/hr (07/09/21 1313)   metronidazole       LOS: 4 days    Time spent: 35 Minutes.     Elmarie Shiley, MD [facility]   If 7PM-7AM, please contact night-coverage [email or web address]  07/09/2021, 1:14 PM

## 2021-07-10 ENCOUNTER — Inpatient Hospital Stay (HOSPITAL_COMMUNITY): Payer: PPO

## 2021-07-10 DIAGNOSIS — J9601 Acute respiratory failure with hypoxia: Secondary | ICD-10-CM | POA: Diagnosis not present

## 2021-07-10 DIAGNOSIS — Z951 Presence of aortocoronary bypass graft: Secondary | ICD-10-CM | POA: Diagnosis not present

## 2021-07-10 DIAGNOSIS — I340 Nonrheumatic mitral (valve) insufficiency: Secondary | ICD-10-CM | POA: Diagnosis not present

## 2021-07-10 LAB — BASIC METABOLIC PANEL
Anion gap: 7 (ref 5–15)
BUN: 21 mg/dL (ref 8–23)
CO2: 29 mmol/L (ref 22–32)
Calcium: 8 mg/dL — ABNORMAL LOW (ref 8.9–10.3)
Chloride: 95 mmol/L — ABNORMAL LOW (ref 98–111)
Creatinine, Ser: 0.64 mg/dL (ref 0.61–1.24)
GFR, Estimated: >60 mL/min (ref >60)
Glucose, Bld: 90 mg/dL (ref 70–99)
Potassium: 4 mmol/L (ref 3.5–5.1)
Sodium: 131 mmol/L — ABNORMAL LOW (ref 135–145)

## 2021-07-10 LAB — CULTURE, BLOOD (ROUTINE X 2)
Culture: NO GROWTH
Special Requests: ADEQUATE

## 2021-07-10 LAB — CBC
HCT: 27.7 % — ABNORMAL LOW (ref 39.0–52.0)
HCT: 30.1 % — ABNORMAL LOW (ref 39.0–52.0)
HCT: 27.1 % — ABNORMAL LOW (ref 39.0–52.0)
Hemoglobin: 9.2 g/dL — ABNORMAL LOW (ref 13.0–17.0)
Hemoglobin: 9.8 g/dL — ABNORMAL LOW (ref 13.0–17.0)
Hemoglobin: 8.8 g/dL — ABNORMAL LOW (ref 13.0–17.0)
MCH: 30.8 pg (ref 26.0–34.0)
MCH: 30.2 pg (ref 26.0–34.0)
MCH: 30.2 pg (ref 26.0–34.0)
MCHC: 33.2 g/dL (ref 30.0–36.0)
MCHC: 32.6 g/dL (ref 30.0–36.0)
MCHC: 32.5 g/dL (ref 30.0–36.0)
MCV: 92.6 fL (ref 80.0–100.0)
MCV: 92.9 fL (ref 80.0–100.0)
MCV: 93.1 fL (ref 80.0–100.0)
Platelets: 392 10*3/uL (ref 150–400)
Platelets: 556 10*3/uL — ABNORMAL HIGH (ref 150–400)
Platelets: 451 10*3/uL — ABNORMAL HIGH (ref 150–400)
RBC: 2.99 MIL/uL — ABNORMAL LOW (ref 4.22–5.81)
RBC: 3.24 MIL/uL — ABNORMAL LOW (ref 4.22–5.81)
RBC: 2.91 MIL/uL — ABNORMAL LOW (ref 4.22–5.81)
RDW: 16.9 % — ABNORMAL HIGH (ref 11.5–15.5)
RDW: 17.2 % — ABNORMAL HIGH (ref 11.5–15.5)
RDW: 17.2 % — ABNORMAL HIGH (ref 11.5–15.5)
WBC: 16.5 10*3/uL — ABNORMAL HIGH (ref 4.0–10.5)
WBC: 20.6 10*3/uL — ABNORMAL HIGH (ref 4.0–10.5)
WBC: 16.4 10*3/uL — ABNORMAL HIGH (ref 4.0–10.5)
nRBC: 0 % (ref 0.0–0.2)
nRBC: 0 % (ref 0.0–0.2)
nRBC: 0.1 % (ref 0.0–0.2)

## 2021-07-10 LAB — OCCULT BLOOD X 1 CARD TO LAB, STOOL: Fecal Occult Bld: POSITIVE — AB

## 2021-07-10 LAB — HEPARIN LEVEL (UNFRACTIONATED)
Heparin Unfractionated: 0.75 IU/mL — ABNORMAL HIGH (ref 0.30–0.70)
Heparin Unfractionated: 0.47 IU/mL (ref 0.30–0.70)
Heparin Unfractionated: 0.58 IU/mL (ref 0.30–0.70)

## 2021-07-10 MED ORDER — CLOPIDOGREL BISULFATE 75 MG PO TABS
75.0000 mg | ORAL_TABLET | Freq: Every day | ORAL | Status: DC
  Administered 2021-07-10 – 2021-07-12 (×3): 75 mg via ORAL
  Filled 2021-07-10 (×3): qty 1

## 2021-07-10 MED ORDER — IOHEXOL 300 MG/ML  SOLN
100.0000 mL | Freq: Once | INTRAMUSCULAR | Status: AC | PRN
  Administered 2021-07-10: 100 mL via ORAL

## 2021-07-10 MED ORDER — SODIUM CHLORIDE (PF) 0.9 % IJ SOLN
INTRAMUSCULAR | Status: AC
  Filled 2021-07-10: qty 50

## 2021-07-10 NOTE — Progress Notes (Addendum)
ANTICOAGULATION CONSULT NOTE - Follow Up Consult  Pharmacy Consult for IV heparin (changed from SQ LMWH) Indication: acute DVT  No Known Allergies  Patient Measurements: Height: '5\' 9"'$  (175.3 cm) Weight: 66.3 kg (146 lb 4.1 oz) IBW/kg (Calculated) : 70.7 Heparin Dosing Weight: total body weight   Vital Signs: Temp: 97.8 F (36.6 C) (05/22 2118) Temp Source: Oral (05/22 1702) BP: 116/62 (05/22 2118) Pulse Rate: 89 (05/22 2118)  Labs: Recent Labs    07/08/21 0425 07/08/21 1903 07/09/21 0330 07/09/21 1005 07/10/21 0326 07/10/21 0837 07/10/21 1247 07/10/21 1953 07/10/21 2058  HGB 8.8*   < > 8.9*   < > 9.2*  --  9.8* 8.8*  --   HCT 26.0*   < > 26.4*   < > 27.7*  --  30.1* 27.1*  --   PLT 386   < > 421*   < > 392  --  556* 451*  --   HEPARINUNFRC  --   --   --    < > 0.75*  --  0.47  --  0.58  CREATININE 0.65  --  0.55*  --   --  0.64  --   --   --    < > = values in this interval not displayed.     Estimated Creatinine Clearance: 80.6 mL/min (by C-G formula based on SCr of 0.64 mg/dL).   Assessment: 23 YOM recently hospitalized with respiratory failure from viral pneumonia presented to ED on 07/04/21 with dyspnea and hypoxia. Patient was found to have elevated d-dimer but was unable to have chest CTA on admission due to AKI. He was started on Enoxaparin for suspected PE on admission. Lower extremity doppler on 5/17 showed RLE DVT. Patient was noted to have blood in stool on 5/21; anticoagulation changed from SQ enoxaparin to IV heparin. SCr improved on 5/22, chest CTA completed and was negative for PE.    Today, 07/10/21: -PM heparin level = 0.58 units/mL, remains therapeutic  -Hgb 9.2 > 9.8 > 8.8, Pltc elevated  -Per GI, rectal bleeding has improved and plan is to continue supportive care at this time -Of note, Cardiology resumed Plavix today   Goal of Therapy:  Heparin level 0.3-0.7 units/ml Monitor platelets by anticoagulation protocol: Yes   Plan:  -Continue  heparin infusion at 1100 units/hr -Monitor CBC at least daily (currently ordered q8h per MD) -Monitor daily heparin level -Monitor for further rectal bleeding     Lindell Spar, PharmD, BCPS Clinical Pharmacist  07/10/2021,9:47 PM

## 2021-07-10 NOTE — Progress Notes (Addendum)
Progress Note  Patient Name: Roberto Ponce Date of Encounter: 07/10/2021  [facility] Cardiologist: Werner Lean, MD   Subjective   No complaints RUE iv leaking a bit Discussed need for CTA to r/o PE   Inpatient Medications    Scheduled Meds:  brimonidine  1 drop Left Eye BID   carvedilol  3.125 mg Oral BID WC   chlorhexidine  15 mL Mouth Rinse BID   dapagliflozin propanediol  10 mg Oral Daily   dorzolamide-timolol  1 drop Both Eyes BID   ezetimibe  10 mg Oral Daily   feeding supplement  237 mL Oral BID BM   guaiFENesin  600 mg Oral BID   latanoprost  1 drop Both Eyes QHS   pantoprazole (PROTONIX) IV  40 mg Intravenous Q12H   predniSONE  60 mg Oral Q breakfast   Continuous Infusions:  sodium chloride 50 mL/hr at 07/09/21 1549   cefTRIAXone (ROCEPHIN)  IV 2 g (07/09/21 1321)   heparin 1,100 Units/hr (07/10/21 0416)   metronidazole 500 mg (07/10/21 0154)   PRN Meds: docusate sodium, iohexol, ipratropium-albuterol, ondansetron (ZOFRAN) IV, polyethylene glycol   Vital Signs    Vitals:   07/09/21 1358 07/09/21 1725 07/09/21 2158 07/10/21 0545  BP: (!) 97/54 112/64 108/63 112/63  Pulse: 80 73 72 69  Resp: '16  16 20  '$ Temp: 97.9 F (36.6 C)  97.6 F (36.4 C) (!) 97.4 F (36.3 C)  TempSrc: Oral   Oral  SpO2: 100%  100% 100%  Weight:      Height:        Intake/Output Summary (Last 24 hours) at 07/10/2021 0758 Last data filed at 07/10/2021 2595 Gross per 24 hour  Intake 1217.74 ml  Output 1025 ml  Net 192.74 ml      07/09/2021    5:00 AM 07/08/2021    5:00 AM 07/07/2021    5:00 AM  Last 3 Weights  Weight (lbs) 146 lb 4.1 oz 144 lb 1.6 oz 142 lb 1.6 oz  Weight (kg) 66.34 kg 65.363 kg 64.456 kg      Telemetry    NSR - Personally Reviewed  ECG    SR LAE old IMI no acute changes  - Personally Reviewed  Physical Exam   Affect appropriate Healthy:  appears stated age HEENT: normal Neck supple with no adenopathy JVP normal no bruits no  thyromegaly Lungs clear with no wheezing and good diaphragmatic motion Heart:  S1/S2 Apical MR murmur, no rub, gallop or click PMI normal post sternotomy Abdomen: benighn, BS positve, no tenderness, no AAA no bruit.  No HSM or HJR post orchiectomy  Distal pulses intact with no bruits No edema Neuro non-focal Skin warm and dry No muscular weakness   Labs    High Sensitivity Troponin:   Recent Labs  Lab 06/20/21 1723 06/21/21 0500 07/04/21 2210 07/04/21 2350  TROPONINIHS 61* '16 8 10     '$ Chemistry Recent Labs  Lab 07/04/21 2210 07/06/21 0238 07/07/21 0427 07/08/21 0425 07/08/21 0931 07/09/21 0330  NA 123*   < > 128* 131*  --  130*  K 4.1   < > 4.5 4.3  --  4.4  CL 86*   < > 90* 94*  --  93*  CO2 28   < > 29 31  --  30  GLUCOSE 172*   < > 143* 132*  --  121*  BUN 18   < > 40* 34*  --  27*  CREATININE 1.25*   < > 0.69 0.65  --  0.55*  CALCIUM 8.2*   < > 8.4* 8.4*  --  8.4*  MG  --   --   --   --  2.3  --   PROT 7.9  --   --   --   --   --   ALBUMIN 2.6*  --   --   --   --   --   AST 47*  --   --   --   --   --   ALT 73*  --   --   --   --   --   ALKPHOS 106  --   --   --   --   --   BILITOT 0.9  --   --   --   --   --   GFRNONAA >60   < > >60 >60  --  >60  ANIONGAP 9   < > 9 6  --  7   < > = values in this interval not displayed.    Lipids No results for input(s): CHOL, TRIG, HDL, LABVLDL, LDLCALC, CHOLHDL in the last 168 hours.  Hematology Recent Labs  Lab 07/09/21 1511 07/09/21 2037 07/10/21 0326  WBC 21.0* 18.9* 16.5*  RBC 2.96* 2.89* 2.99*  HGB 9.1* 8.7* 9.2*  HCT 26.7* 26.4* 27.7*  MCV 90.2 91.3 92.6  MCH 30.7 30.1 30.8  MCHC 34.1 33.0 33.2  RDW 15.9* 16.3* 16.9*  PLT 439* 416* 392   Thyroid No results for input(s): TSH, FREET4 in the last 168 hours.  BNP Recent Labs  Lab 07/04/21 2210  BNP 89.9    DDimer  Recent Labs  Lab 07/04/21 2210  DDIMER >20.00*     Radiology    DG Chest 2 View  Result Date: 07/09/2021 CLINICAL DATA:   Shortness of breath. EXAM: CHEST - 2 VIEW COMPARISON:  07/06/2021 and prior studies FINDINGS: Pneumomediastinum is now identified, primarily on the LEFT. Improved airspace opacities within the LEFT lung noted. No definite pneumothorax is noted. The cardiomediastinal silhouette is otherwise unchanged with evidence of median sternotomy. IMPRESSION: 1. Pneumomediastinum, primarily on the LEFT. No definite pneumothorax. 2. Improved LEFT lung airspace opacities. These results will be called to the ordering clinician or representative by the Radiologist Assistant, and communication documented in the PACS or [employer]. Electronically Signed   By: Margarette Canada M.D.   On: 07/09/2021 12:19    Cardiac Studies   Echo from 07/05/21:    1. Left ventricular ejection fraction, by estimation, is 40 to 45%. The  left ventricle has mildly decreased function. The left ventricle  demonstrates regional wall motion abnormalities (see scoring  diagram/findings for description). There is mild  concentric left ventricular hypertrophy. Left ventricular diastolic  parameters are indeterminate. There is akinesis of the left ventricular,  basal-mid inferior wall. There is hypokinesis of the left ventricular,  mid-apical inferolateral wall and inferior   wall.   2. Right ventricular systolic function is normal. The right ventricular  size is normal. There is mildly elevated pulmonary artery systolic  pressure.   3. The mitral valve is abnormal. Mild to moderate mitral valve  regurgitation.   4. Tricuspid valve regurgitation is mild to moderate.   5. The aortic valve is normal in structure. Aortic valve regurgitation is  not visualized. No aortic stenosis is present.   Comparison(s): No significant change from prior study. Prior images  reviewed side by  side.    Echo from 06/20/21:   1. Left ventricular ejection fraction, by estimation, is 40 to 45%. The  left ventricle has mildly decreased function. The left  ventricle  demonstrates regional wall motion abnormalities (see scoring  diagram/findings for description). There is mild left  ventricular hypertrophy. Left ventricular diastolic parameters are  consistent with Grade II diastolic dysfunction (pseudonormalization).  Elevated left atrial pressure.   2. Right ventricular systolic function is mildly reduced. The right  ventricular size is mildly enlarged. There is moderately elevated  pulmonary artery systolic pressure. The estimated right ventricular  systolic pressure is 16.1 mmHg.   3. Tricuspid valve regurgitation is mild to moderate.   4. The aortic valve is tricuspid. Aortic valve regurgitation is not  visualized. Aortic valve sclerosis is present, with no evidence of aortic  valve stenosis.   5. The inferior vena cava is dilated in size with <50% respiratory  variability, suggesting right atrial pressure of 15 mmHg.   6. The mitral valve is abnormal. Moderate mitral valve regurgitation.  Eccentric posterior directed MR jet. Difficult to quantify severity given  eccentric jet, but normal LA/LV size argues against severe MR. Consider  TEE or cardiac MRI for further  evaluation    Right and left heart cath from 02/14/21:   1.  Severe 3-vessel coronary artery disease with total occlusion of the LAD and total occlusion of the RCA, and severe stenosis of the native left circumflex 2.  Status post CABG with continued patency of the LIMA to LAD and RIMA to RCA 3.  Successful PCI of severe 95% stenosis of the mid circumflex, treated with a 3.0 x 15 mm Onyx frontier DES 4.  Severe mitral regurgitation by echo assessment, hemodynamics demonstrate low wedge pressure, no V wave, and no pulmonary hypertension.  Recommendations: DAPT with aspirin and clopidogrel x6 months, repeat echocardiogram in 6 months, defer transcatheter edge-to-edge mitral valve repair at present to reevaluate after PCI.      Patient Profile     Roberto Ponce is  a 71 y.o. male with a hx of CAD with hx of CABG in 1988 ( LIMA to LAD and RIMA to RCA) and PCI to mid Cx 01/2021, moderate to severe MR, chronic systolic and diastolic heart failure, testicular cancer status post orchiectomy,  HLD, glaucoma, presumed SIADH with hyponatremia (during hospitalization 06/18/21),  cardiology is following since 07/06/2021 for the evaluation of CHF at the request of Dr Halford Chessman.  Assessment & Plan    Acute hypoxic respiratory failure  - will order CTA per wife's request and define duration of Rx ? DVT RLE hypercoagulable with testicular cancer vs other Lung exam abnormal with bilateral parenchymal dx rhonchi and diagnosis of BOOP. Also etiology of pneumomediastinum not fully apparent    Chronic systolic and diastolic heart failure  - Echo repeat 07/05/21 LVEF 40-45%, no acute change of MR   - clinically euvolemic today  - GDMT: on PTA Coreg 3.'125mg'$  BID, spironolactone 12.'5mg'$ , farxiga '10MG'$  (not on losartan due to hx of hypotension), spironolactone held currently    Moderate to severe MR  - symptoms stable - follow up with Dr Burt Knack once discharged/recovered from acute illness  - given co morbidities and previous CABG not a candidate for repeat open heart surgery would need to consider clip   CAD with hx of CABG 1988 and PCI to mid Cx 02/14/2021 - Hs trop negative x2 - EKG no acute ischemic findings - no angina symptoms  - PTA medical  therapy with ASA +Plavix (6 month), Coreg, Zetia, (ARB dopped due to hypotension last admission, statin was held due to unclear etiology of transaminitis last admission) - given therapeutic anticoagulation need with acute DVT, ASA was stopped and recommended continue Plavix+ Anticoagulation going forward for CAD per Dr Johney Frame; however patient is now with possible GI bleed with dark stool, Plavix has been stopped and Lovenox transitioned to heparin gtt per primary team, GI consult from 5/21 noted, felt bleeding is scant, recommend to hold  Plavix and continue heparin gtt while monitor GI bleed clinically for now, no scope planned thus far  - would resume Plavix as soon as able    Acute DVT of right popliteal /posterior tibial Letitia Caul /gastrocnemius veins  - noted on doppler 07/05/21 - suspicion for PE is strong, Echo 5/17  without right heart strain  - on heparin gtt now, managed per medicine    Hyponatremia - was felt due to SIADH in the setting of pneumonia last admission, Na was 130 at discharge on 06/28/21 and 123 on 07/04/21 this admission, improved to 130 after reducing Lasix this admission, diuretics held currently due to GIB and pneumomediastinum with pending workup   Testicular cancer - under surveillance, pending CT planned per urology   Suspected GI bleed Anemia  Pneumomediastinum  Staph epidermidis in 1/2 blood cultures 07/05/21  Leukocytosis  - managed per medicine    For questions or updates, please contact [place] Please consult [email or web address] for contact info under        Signed, Margie Billet, NP  07/10/2021, 7:58 AM    Patient examined chart reviewed Discussed care with primary pulmonary and patients wife. Exam with rhonchi diffuse parenchymal sounds Apical MR murmur euvolemic post orchiectomy  Have ordered CTA r/o PE will resume plavix post stenting December 2022 and stable Hct   Jenkins Rouge MD [facility]

## 2021-07-10 NOTE — TOC Progression Note (Signed)
Transition of Care [facility]) - Progression Note    Patient Details  Name: Roberto Ponce MRN: [id number] Date of Birth: 18-Sep-1950  [facility] CM/SW Contact  Leeroy Cha, RN Phone Number: 07/10/2021, 10:11 AM  Clinical Narrative:    Following for toc needs.   Expected Discharge Plan: [place] Barriers to Discharge: No Barriers Identified  Expected Discharge Plan and Services Expected Discharge Plan: [place]   Discharge Planning Services: CM Consult Post Acute Care Choice: [place] arrangements for the past 2 months: Single Family Home                                       Social Determinants of Health (SDOH) Interventions    Readmission Risk Interventions     View : No data to display.

## 2021-07-10 NOTE — Progress Notes (Signed)
ANTICOAGULATION CONSULT NOTE - Follow Up Consult  Pharmacy Consult for IV UFH (changed from [place]) Indication: acute DVT  No Known Allergies  Patient Measurements: Height: '5\' 9"'$  (175.3 cm) Weight: 66.3 kg (146 lb 4.1 oz) IBW/kg (Calculated) : 70.7 Heparin Dosing Weight: 66 kg  Vital Signs: Temp: 97.4 F (36.3 C) (05/22 0545) Temp Source: Oral (05/22 0545) BP: 108/60 (05/22 0856) Pulse Rate: 69 (05/22 0545)  Labs: Recent Labs    07/08/21 0425 07/08/21 1903 07/09/21 0330 07/09/21 1005 07/09/21 1511 07/09/21 2037 07/10/21 0326 07/10/21 0837  HGB 8.8*   < > 8.9*   < > 9.1* 8.7* 9.2*  --   HCT 26.0*   < > 26.4*   < > 26.7* 26.4* 27.7*  --   PLT 386   < > 421*   < > 439* 416* 392  --   HEPARINUNFRC  --   --   --   --   --  0.65 0.75*  --   CREATININE 0.65  --  0.55*  --   --   --   --  0.64   < > = values in this interval not displayed.    Estimated Creatinine Clearance: 80.6 mL/min (by C-G formula based on SCr of 0.64 mg/dL).   Medications:  Medications Prior to Admission  Medication Sig Dispense Refill Last Dose   acetaminophen (TYLENOL) 500 MG tablet Take 1,000 mg by mouth every 6 (six) hours as needed for moderate pain.   Past Week   aspirin EC 81 MG tablet Take 1 tablet (81 mg total) by mouth daily.   07/04/2021   brimonidine (ALPHAGAN) 0.2 % ophthalmic solution Place 1 drop into the left eye 2 (two) times daily.   07/04/2021 at am   carvedilol (COREG) 3.125 MG tablet Take 1 tablet (3.125 mg total) by mouth 2 (two) times daily with a meal. 60 tablet 2 07/04/2021 at 0930   clopidogrel (PLAVIX) 75 MG tablet Take 1 tablet (75 mg total) by mouth daily with breakfast. 90 tablet 3 07/04/2021   dapagliflozin propanediol (FARXIGA) 10 MG TABS tablet Take 1 tablet (10 mg total) by mouth daily. 30 tablet 2 07/04/2021   dorzolamide-timolol (COSOPT) 22.3-6.8 MG/ML ophthalmic solution Place 1 drop into both eyes 2 (two) times daily.   07/04/2021 at am   ezetimibe (ZETIA) 10 MG tablet  TAKE 1 TABLET(10 MG) BY MOUTH DAILY (Patient taking differently: Take 10 mg by mouth daily.) 90 tablet 1 07/04/2021   furosemide (LASIX) 40 MG tablet Take 1 tablet (40 mg total) by mouth 2 (two) times daily. 60 tablet 3 07/04/2021 at am   latanoprost (XALATAN) 0.005 % ophthalmic solution Place 1 drop into both eyes at bedtime.    07/03/2021   SODIUM FLUORIDE 5000 PPM 1.1 % PSTE Apply 1 application. topically in the morning and at bedtime.   07/04/2021   spironolactone (ALDACTONE) 25 MG tablet Take 0.5 tablets (12.5 mg total) by mouth daily. 30 tablet 2 07/04/2021 at am   triamcinolone cream (KENALOG) 0.1 % Apply 1 application topically 2 (two) times daily as needed (eczema).   Past Week   nitroGLYCERIN (NITROSTAT) 0.4 MG SL tablet Place 1 tablet (0.4 mg total) under the tongue every 5 (five) minutes as needed for chest pain. (Patient not taking: Reported on 07/05/2021) 25 tablet 3 Not Taking   Scheduled:   brimonidine  1 drop Left Eye BID   carvedilol  3.125 mg Oral BID WC   chlorhexidine  15 mL Mouth  Rinse BID   dapagliflozin propanediol  10 mg Oral Daily   dorzolamide-timolol  1 drop Both Eyes BID   ezetimibe  10 mg Oral Daily   feeding supplement  237 mL Oral BID BM   guaiFENesin  600 mg Oral BID   latanoprost  1 drop Both Eyes QHS   pantoprazole (PROTONIX) IV  40 mg Intravenous Q12H   predniSONE  60 mg Oral Q breakfast   sodium chloride (PF)        Assessment: Pt is a 11 YOM recently hospitalized with respiratory failure from viral pneumonia, presented to ED on 07/04/21 with dyspnea and hypoxia with SpO2 in low 80s on room air. Pt was found to have elevated d-dimer but was unable to have chest CTA on admission due to AKI. He was started on Lovenox for suspected PE on admission. Lower extremity doppler on 5/17 showed RLE DVT. Pt was noted to have blood in stool on 5/21 with anticoagulation change from Lovenox to IV heparin. Scr improved on 5/22 with completed chest CTA negative for PE.     07/10/21  -Heparin level 0.47, therapeutic on 1100 units/ hr -Hgb 9.8, Plts 556 -Per GI rectal bleeding has improved and plan is to continue supportive care at this time  Goal of Therapy:  Heparin level 0.3-0.7 units/ml Monitor platelets by anticoagulation protocol: Yes   Plan:  -Continue heparin 1100 units/ hr -Check heparin level in 8 hours to ensure level is still therapeutic before changing to daily monitoring -Daily CBC, daily heparin level when stable -Monitor for further rectal bleeding or thrombosis   Kaytelyn Glore 07/10/2021,12:15 PM

## 2021-07-10 NOTE — Progress Notes (Signed)
ANTICOAGULATION CONSULT NOTE - Initial Consult  Pharmacy Consult for IV UFH (changed from [place]) Indication: pulmonary embolus  No Known Allergies  Patient Measurements: Height: '5\' 9"'$  (175.3 cm) Weight: 66.3 kg (146 lb 4.1 oz) IBW/kg (Calculated) : 70.7 Heparin Dosing Weight: 66 kg  Vital Signs: Temp: 97.6 F (36.4 C) (05/21 2158) BP: 108/63 (05/21 2158) Pulse Rate: 72 (05/21 2158)  Labs: Recent Labs    07/07/21 0427 07/08/21 0425 07/08/21 1903 07/09/21 0330 07/09/21 1005 07/09/21 1511 07/09/21 2037 07/10/21 0326  HGB 10.3* 8.8*   < > 8.9*   < > 9.1* 8.7* 9.2*  HCT 31.0* 26.0*   < > 26.4*   < > 26.7* 26.4* 27.7*  PLT 440* 386   < > 421*   < > 439* 416* 392  HEPARINUNFRC  --   --   --   --   --   --  0.65 0.75*  CREATININE 0.69 0.65  --  0.55*  --   --   --   --    < > = values in this interval not displayed.     Estimated Creatinine Clearance: 80.6 mL/min (A) (by C-G formula based on SCr of 0.55 mg/dL (L)).   Medical History: Past Medical History:  Diagnosis Date   Asthma    Coronary artery disease    Glaucoma    History of gallstones    Hyperlipidemia    Hypotension due to medication 06/22/2021   LVH (left ventricular hypertrophy)    MI (myocardial infarction) (HCC)    MI, old    Mild aortic sclerosis    Mitral regurgitation    Mixed dyslipidemia    Shock ([place]) 06/22/2021   Venous stasis     Medications:  Scheduled:   brimonidine  1 drop Left Eye BID   carvedilol  3.125 mg Oral BID WC   chlorhexidine  15 mL Mouth Rinse BID   dapagliflozin propanediol  10 mg Oral Daily   dorzolamide-timolol  1 drop Both Eyes BID   ezetimibe  10 mg Oral Daily   feeding supplement  237 mL Oral BID BM   guaiFENesin  600 mg Oral BID   latanoprost  1 drop Both Eyes QHS   pantoprazole (PROTONIX) IV  40 mg Intravenous Q12H   predniSONE  60 mg Oral Q breakfast   Infusions:   sodium chloride 50 mL/hr at 07/09/21 1549   cefTRIAXone (ROCEPHIN)  IV 2 g (07/09/21 1321)    heparin 1,200 Units/hr (07/09/21 1313)   metronidazole 500 mg (07/10/21 0154)    Assessment: 71 yo on full dose Lovenox for PE, found to have possible GIB to change to IV heparin per Md orders for time being while final plan is worked out. Last Lovenox given 0022 5/21. Hgb 9.1.   07/10/2021 HL 0.75 supra-therapeutic on 1200 units/hr Hgb 9.2, plts WNL No bleeding noted  Goal of Therapy:  Heparin level 0.3-0.7 units/ml Monitor platelets by anticoagulation protocol: Yes   Plan:  decrease heparin to 1100 units/hr.  Check heparin level in 8 hours Daily CBC  Dolly Rias RPh 07/10/2021, 4:01 AM

## 2021-07-10 NOTE — Care Management Important Message (Signed)
Important Message  Patient Details IM Letter given to the Patient Name: Roberto Ponce MRN: [id number] Date of Birth: [birth year]   Medicare Important Message Given:  Yes     Kerin Salen 07/10/2021, 2:14 PM

## 2021-07-10 NOTE — Evaluation (Signed)
Physical Therapy Evaluation Patient Details Name: Roberto Ponce MRN: [id number] DOB: 09-11-1950 Today's Date: 07/10/2021  History of Present Illness  Patient is 71 y.o. 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  The evening of 07/04/2021 he developed sudden onset of dyspnea associated with hypoxia with oxygen saturation as low as 80% on RA. Pt placed on BiPAP in ED and admitted for ARF. Also found to have acute DVT of Rt LE. PMH significant for CAD, MVP with moderate to severe MR, testicular cancer s/p orchiectomy, glaucoma, hyperlipidemia.    Clinical Impression  [name] is 71 y.o. male admitted with above HPI and diagnosis. Patient is currently limited by functional impairments below (see PT problem list). Patient lives with his spouse and prior to last admission was independent but now requires 2L/min O2 and RW for transfers and gait. Currently pt requires Min assist/guard for transfers and gait and desats to 84% on 2L/min but recovered to 100% with seated rest of ~1 minute. Patient will benefit from continued skilled PT interventions to address impairments and progress independence with mobility, recommending return home with HHPT and assist from spouse. Acute PT will follow and progress as able.        Recommendations for follow up therapy are one component of a multi-disciplinary discharge planning process, led by the attending physician.  Recommendations may be updated based on patient status, additional functional criteria and insurance authorization.  Follow Up Recommendations Home health PT    Assistance Recommended at Discharge Intermittent Supervision/Assistance  Patient can return home with the following  A little help with walking and/or transfers;A little help with bathing/dressing/bathroom;Assistance with cooking/housework;Direct  supervision/assist for medications management;Assist for transportation;Help with stairs or ramp for entrance    Equipment Recommendations None recommended by PT  Recommendations for Other Services       Functional Status Assessment Patient has had a recent decline in their functional status and demonstrates the ability to make significant improvements in function in a reasonable and predictable amount of time.     Precautions / Restrictions Precautions Precautions: Fall Precaution Comments: monitor VS Restrictions Weight Bearing Restrictions: No      Mobility  Bed Mobility               General bed mobility comments: pt OOB in recliner    Transfers Overall transfer level: Needs assistance Equipment used: Rolling walker (2 wheels) Transfers: Sit to/from Stand Sit to Stand: Min guard, Supervision           General transfer comment: guarding/supervisino for safety. pt using bil UE for power up and to control lowering to recliner    Ambulation/Gait Ambulation/Gait assistance: Min guard Gait Distance (Feet): 200 Feet (1 seated rest break ~100') Assistive device: Rolling walker (2 wheels) Gait Pattern/deviations: Step-through pattern, Decreased stride length Gait velocity: decr     General Gait Details: overall steady with no overt LOB, pt with low hip/knee flexion and low foot clearance during swing phase. pt on 2L/min and desat to 84% low, recovered with seated rest break to 100% on 2L  Stairs            Wheelchair Mobility    Modified Rankin (Stroke Patients Only)       Balance Overall balance assessment: Mild deficits observed, not formally tested, Needs assistance Sitting-balance support: Feet supported Sitting balance-Leahy Scale: Good     Standing balance support: [place]  on assistive device for balance, During functional activity, Bilateral upper extremity supported Standing balance-Leahy Scale: Fair                                Pertinent Vitals/Pain Pain Assessment Pain Assessment: No/denies pain    Home Living Family/patient expects to be discharged to:: Private residence Living Arrangements: Spouse/significant other Available Help at Discharge: Family Type of Home: House Home Access: Level entry Entrance Stairs-Rails: None     Home Layout: One level Home Equipment: Conservation officer, nature (2 wheels);Cane - single point Additional Comments: pt on 2L/min at home and using RW since recent admission    Prior Function Prior Level of Function : Independent/Modified Independent             Mobility Comments: went home with oxygen at 2-3 L and RW ADLs Comments: Pt's wife assisting with sink bath since last addmission     Hand Dominance   Dominant Hand: Right    Extremity/Trunk Assessment   Upper Extremity Assessment Upper Extremity Assessment: Defer to OT evaluation    Lower Extremity Assessment Lower Extremity Assessment: Generalized weakness;RLE deficits/detail;LLE deficits/detail RLE Deficits / Details: 4+/5 MMT for hip flexion, knee ext/flex, dorsiflexion RLE Sensation: WNL RLE Coordination: WNL LLE Deficits / Details: 4+/5 MMT for hip flexion, knee ext/flex, dorsiflexion LLE Sensation: WNL LLE Coordination: WNL    Cervical / Trunk Assessment Cervical / Trunk Assessment: Normal  Communication   Communication: No difficulties  Cognition Arousal/Alertness: Awake/alert Behavior During Therapy: WFL for tasks assessed/performed Overall Cognitive Status: Within Functional Limits for tasks assessed                                          General Comments      Exercises Other Exercises Other Exercises: reviewed PLB for recovery during therapeutic rest breaks during gait. pt reported 2/4 for DOE scale   Assessment/Plan    PT Assessment Patient needs continued PT services  PT Problem List Decreased strength;Decreased mobility;Decreased activity tolerance;Decreased  knowledge of use of DME;Cardiopulmonary status limiting activity       PT Treatment Interventions DME instruction;Therapeutic activities;Gait training;Therapeutic exercise;Patient/family education;Functional mobility training    PT Goals (Current goals can be found in the Care Plan section)  Acute Rehab PT Goals Patient Stated Goal: to  get back strength and independence and start exercising PT Goal Formulation: With patient/family Time For Goal Achievement: 07/24/21 Potential to Achieve Goals: Good    Frequency Min 3X/week     Co-evaluation               AM-PAC PT "6 Clicks" Mobility  Outcome Measure Help needed turning from your back to your side while in a flat bed without using bedrails?: A Little Help needed moving from lying on your back to sitting on the side of a flat bed without using bedrails?: A Little Help needed moving to and from a bed to a chair (including a wheelchair)?: A Little Help needed standing up from a chair using your arms (e.g., wheelchair or bedside chair)?: A Little Help needed to walk in hospital room?: A Little Help needed climbing 3-5 steps with a railing? : A Lot 6 Click Score: 17    End of Session Equipment Utilized During Treatment: Gait belt;Oxygen Activity Tolerance: Patient tolerated treatment well Patient left: in chair;with call bell/phone  within reach;with family/visitor present Nurse Communication: Mobility status PT Visit Diagnosis: Difficulty in walking, not elsewhere classified (R26.2)    Time: 0131-4388 PT Time Calculation (min) (ACUTE ONLY): 29 min   Charges:   PT Evaluation $PT Eval Low Complexity: 1 Low PT Treatments $Gait Training: 8-22 mins        Verner Mould, DPT Acute Rehabilitation Services Office [phone] Pager [phone]  07/10/21 4:48 PM

## 2021-07-10 NOTE — Progress Notes (Signed)
PROGRESS NOTE    Roberto Ponce  FBX:[id number] DOB: 1951-01-31 DOA: 07/04/2021 PCP: Kristen Loader, FNP   Brief Narrative: 71 year old with past medical history significant for CAD, MVP with moderate to severe MR, testicular cancer s/p orchiectomy, glaucoma, hyperlipidemia, recent admission from 06/18/2021 until 06/28/2021 with respiratory failure from viral pneumonia with post Viral BOOP, acute pulmonary edema and hyponatremia from SIADH.  The evening of 07/04/2021 he developed sudden onset of dyspnea associated with hypoxia with oxygen saturation as low as 80% on room air.  He presented to the ER and required BiPAP.  Work-up also consistent with acute right lower extremity DVT.  Patient care transferred to [facility] 5[facility]. Patient develop bloody stool, start IV Protonix. Lovenox change to heparin. He was found to have pneumomediastinum. Esophagogram was negative for esophageal perforation. Plan to monitor.    Assessment & Plan:   Principal Problem:   Acute hypoxemic respiratory failure (HCC) Active Problems:   Hyperlipidemia   Coronary artery disease   Aortic stenosis   Hyponatremia   Glaucoma   History of testicular cancer   History of SIADH   DVT (deep venous thrombosis) (HCC)   1-Acute hypoxic respiratory failure Thought to be secondary to acute pulmonary edema and recurrence of postviral BOOP. Solu-Medrol-- change to prednisone.  Continue to wean oxygen as tolerated Chest X ray showed Pneumomediastinum. He is asymptomatic. Esophagogram negative for perforation.  Pulmonologist recommend-- monitor clinically.   2-Acute right lower extremity DVT, in the setting of testicular cancer and recent hospitalization with decreased mobility. -Echo no evidence of RV strain.  -He was transition to Heparin-- due to concern for GI bleed.   3-CAD status post CABG, moderate to severe MR, acute on chronic systolic heart failure, hyperlipidemia -Appreciate cardiology assistance. -Defer to  cardiology antiplatelet recommendation; patient is a on aspirin and Plavix -Continue with Coreg  Farxiga, Lasix and spironolactone -Plavix on hold per cardiology recommendation. Resume as soon as able. Will follow GI recommendation.   4-Possible GI bleed;  Had [employer] stool mix with red dark blood: Per cardiology ok to hold plavix. Resume Plavix as soon as able. Follow GI recommendation when to resume plavix.  Continue with IV Protonix  CBC every 8 hours.  Hb has remain stable, on heparin Gtt.  He had Black stool today. Nurse will inform this to GI. Will continue to monitor hb. He is on IV Protonix. Hb stable at 12 PM.    Pneumomediastinum:  -Differential small air way burst v s blebs burst vs esophageal perforation.  -Pulmonologist, recommend IV ceftriaxone and flagyl.  -CT angio: negative for PE; showed small left side pneumothorax, moderate amount of Air mediastinum. CT scan results discussed with Pulmonologist and GI.  -Esophagogram Ordered: No evidence of esophageal perforation.    Staph epidermidis in blood cultures: 2 out of 4 bottles: Suspected contaminant Repeated blood cultures 5/20: no growth to date.   -Hyponatremia from SIADH Continue with fluid restriction Continue with lasix.   -Glaucoma: Continue with eyedrop -History of testicular cancer: Need to follow-up with Dr. Alen Blew  -Leukocytosis; in setting steroid.      Estimated body mass index is 21.6 kg/m as calculated from the following:   Height as of this encounter: '5\' 9"'$  (1.753 m).   Weight as of this encounter: 66.3 kg.   DVT prophylaxis: Lovenox Code Status: Full code Family Communication: Discussed with wife who was at bedside Disposition Plan:  Status is: Inpatient Remains inpatient appropriate because: Management of respiratory failure.  PT OT consulted  Consultants:  Cardiology  CCM  Procedures:  ECHO  Antimicrobials:    Subjective: He is breathing better, he report less SOB on  exertion.  He denies chest pain or abdominal pain.  He feels he will have BM soon.   Objective: Vitals:   07/09/21 1725 07/09/21 2158 07/10/21 0545 07/10/21 0856  BP: 112/64 108/63 112/63 108/60  Pulse: 73 72 69   Resp:  16 20   Temp:  97.6 F (36.4 C) (!) 97.4 F (36.3 C)   TempSrc:   Oral   SpO2:  100% 100%   Weight:      Height:        Intake/Output Summary (Last 24 hours) at 07/10/2021 1422 Last data filed at 07/10/2021 1058 Gross per 24 hour  Intake 1635.18 ml  Output 625 ml  Net 1010.18 ml    Filed Weights   07/07/21 0500 07/08/21 0500 07/09/21 0500  Weight: 64.5 kg 65.4 kg 66.3 kg    Examination:  General exam: NAD Respiratory system: CTA Cardiovascular system: S 1, S 2 RRR Gastrointestinal system: BS present, soft nt Central nervous system: Alert, follows command Extremities: Symmetric power.     Data Reviewed: I have personally reviewed following labs and imaging studies  CBC: Recent Labs  Lab 07/04/21 2210 07/06/21 0238 07/09/21 1005 07/09/21 1511 07/09/21 2037 07/10/21 0326 07/10/21 1247  WBC 13.2*   < > 15.6* 21.0* 18.9* 16.5* 20.6*  NEUTROABS 10.6*  --   --   --   --   --   --   HGB 12.3*   < > 9.1* 9.1* 8.7* 9.2* 9.8*  HCT 35.5*   < > 27.4* 26.7* 26.4* 27.7* 30.1*  MCV 87.0   < > 91.3 90.2 91.3 92.6 92.9  PLT 506*   < > 446* 439* 416* 392 556*   < > = values in this interval not displayed.    Basic Metabolic Panel: Recent Labs  Lab 07/06/21 0238 07/07/21 0427 07/08/21 0425 07/08/21 0931 07/09/21 0330 07/10/21 0837  NA 129* 128* 131*  --  130* 131*  K 4.0 4.5 4.3  --  4.4 4.0  CL 93* 90* 94*  --  93* 95*  CO2 '27 29 31  '$ --  30 29  GLUCOSE 146* 143* 132*  --  121* 90  BUN 39* 40* 34*  --  27* 21  CREATININE 0.54* 0.69 0.65  --  0.55* 0.64  CALCIUM 8.4* 8.4* 8.4*  --  8.4* 8.0*  MG  --   --   --  2.3  --   --   PHOS  --   --   --  4.0  --   --     GFR: Estimated Creatinine Clearance: 80.6 mL/min (by C-G formula based on SCr  of 0.64 mg/dL). Liver Function Tests: Recent Labs  Lab 07/04/21 2210  AST 47*  ALT 73*  ALKPHOS 106  BILITOT 0.9  PROT 7.9  ALBUMIN 2.6*    No results for input(s): LIPASE, AMYLASE in the last 168 hours. No results for input(s): AMMONIA in the last 168 hours. Coagulation Profile: Recent Labs  Lab 07/04/21 2210  INR 1.1    Cardiac Enzymes: No results for input(s): CKTOTAL, CKMB, CKMBINDEX, TROPONINI in the last 168 hours. BNP (last 3 results) No results for input(s): PROBNP in the last 8760 hours. HbA1C: No results for input(s): HGBA1C in the last 72 hours. CBG: No results for input(s): GLUCAP in the last 168 hours. Lipid Profile:  No results for input(s): CHOL, HDL, LDLCALC, TRIG, CHOLHDL, LDLDIRECT in the last 72 hours. Thyroid Function Tests: No results for input(s): TSH, T4TOTAL, FREET4, T3FREE, THYROIDAB in the last 72 hours. Anemia Panel: No results for input(s): VITAMINB12, FOLATE, FERRITIN, TIBC, IRON, RETICCTPCT in the last 72 hours. Sepsis Labs: Recent Labs  Lab 07/04/21 2350 07/05/21 0250 07/06/21 0238  PROCALCITON <0.10 <0.10 <0.10     Recent Results (from the past 240 hour(s))  Resp Panel by RT-PCR (Flu A&B, Covid) Nasopharyngeal Swab     Status: None   Collection Time: 07/04/21 10:10 PM   Specimen: Nasopharyngeal Swab; Nasopharyngeal(NP) swabs in vial transport medium  Result Value Ref Range Status   SARS Coronavirus 2 by RT PCR NEGATIVE NEGATIVE Final    Comment: (NOTE) SARS-CoV-2 target nucleic acids are NOT DETECTED.  The SARS-CoV-2 RNA is generally detectable in upper respiratory specimens during the acute phase of infection. The lowest concentration of SARS-CoV-2 viral copies this assay can detect is 138 copies/mL. A negative result does not preclude SARS-Cov-2 infection and should not be used as the sole basis for treatment or other patient management decisions. A negative result may occur with  improper specimen collection/handling,  submission of specimen other than nasopharyngeal swab, presence of viral mutation(s) within the areas targeted by this assay, and inadequate number of viral copies(<138 copies/mL). A negative result must be combined with clinical observations, patient history, and epidemiological information. The expected result is Negative.  Fact Sheet for Patients:  EntrepreneurPulse.com.au  Fact Sheet for Healthcare Providers:  IncredibleEmployment.be  This test is no t yet approved or cleared by the [place] FDA and  has been authorized for detection and/or diagnosis of SARS-CoV-2 by FDA under an Emergency Use Authorization (EUA). This EUA will remain  in effect (meaning this test can be used) for the duration of the COVID-19 declaration under Section 564(b)(1) of the Act, 21 U.S.C.section 360bbb-3(b)(1), unless the authorization is terminated  or revoked sooner.       Influenza A by PCR NEGATIVE NEGATIVE Final   Influenza B by PCR NEGATIVE NEGATIVE Final    Comment: (NOTE) The Xpert Xpress SARS-CoV-2/FLU/RSV plus assay is intended as an aid in the diagnosis of influenza from Nasopharyngeal swab specimens and should not be used as a sole basis for treatment. Nasal washings and aspirates are unacceptable for Xpert Xpress SARS-CoV-2/FLU/RSV testing.  Fact Sheet for Patients: EntrepreneurPulse.com.au  Fact Sheet for Healthcare Providers: IncredibleEmployment.be  This test is not yet approved or cleared by the [place] FDA and has been authorized for detection and/or diagnosis of SARS-CoV-2 by FDA under an Emergency Use Authorization (EUA). This EUA will remain in effect (meaning this test can be used) for the duration of the COVID-19 declaration under Section 564(b)(1) of the Act, 21 U.S.C. section 360bbb-3(b)(1), unless the authorization is terminated or revoked.  Performed at [facility], [place] [address] [place], [place] [address]   Culture, blood (Routine X 2) w Reflex to ID Panel     Status: None   Collection Time: 07/05/21  1:00 AM   Specimen: BLOOD  Result Value Ref Range Status   Specimen Description   Final    BLOOD BLOOD RIGHT WRIST Performed at [facility] [address] [place], [place] [address]    Special Requests   Final    BOTTLES DRAWN AEROBIC AND ANAEROBIC Blood Culture adequate volume Performed at [facility] [address], [place], [place] [address]  Culture   Final    NO GROWTH 5 DAYS Performed at [facility], [place] [address] [place], [place] [address]    Report Status 07/10/2021 FINAL  Final  Culture, blood (Routine X 2) w Reflex to ID Panel     Status: Abnormal   Collection Time: 07/05/21  1:07 AM   Specimen: BLOOD  Result Value Ref Range Status   Specimen Description   Final    BLOOD LEFT ANTECUBITAL Performed at [facility] [address], [place], [place] [address]    Special Requests   Final    BOTTLES DRAWN AEROBIC AND ANAEROBIC Blood Culture adequate volume Performed at [facility] [address], [place], [place] [address]    Culture  Setup Time   Final    GRAM POSITIVE COCCI IN BOTH AEROBIC AND ANAEROBIC BOTTLES CRITICAL RESULT CALLED TO, READ BACK BY AND VERIFIED WITH: PHARMD ELLEN JACKSON 07/06/21'@3'$ :08 BY TW    Culture (A)  Final    STAPHYLOCOCCUS EPIDERMIDIS THE SIGNIFICANCE OF ISOLATING THIS ORGANISM FROM A SINGLE SET OF BLOOD CULTURES WHEN MULTIPLE SETS ARE DRAWN IS UNCERTAIN. PLEASE NOTIFY THE MICROBIOLOGY DEPARTMENT WITHIN ONE WEEK IF SPECIATION AND SENSITIVITIES ARE REQUIRED. Performed at [facility], [place] [address], [place], [place] [address]    Report Status 07/08/2021 FINAL  Final  Blood Culture ID Panel (Reflexed)     Status: Abnormal   Collection Time: 07/05/21  1:07 AM  Result Value Ref Range Status    Enterococcus faecalis NOT DETECTED NOT DETECTED Final   Enterococcus Faecium NOT DETECTED NOT DETECTED Final   Listeria monocytogenes NOT DETECTED NOT DETECTED Final   Staphylococcus species DETECTED (A) NOT DETECTED Final    Comment: CRITICAL RESULT CALLED TO, READ BACK BY AND VERIFIED WITH: PHARMD ELLEN JACKSON 07/06/21'@3'$ :08 BY TW    Staphylococcus aureus (BCID) NOT DETECTED NOT DETECTED Final   Staphylococcus epidermidis DETECTED (A) NOT DETECTED Final    Comment: Methicillin (oxacillin) resistant coagulase negative staphylococcus. Possible blood culture contaminant (unless isolated from more than one blood culture draw or clinical case suggests pathogenicity). No antibiotic treatment is indicated for blood  culture contaminants. CRITICAL RESULT CALLED TO, READ BACK BY AND VERIFIED WITH: PHARMD ELLEN JACKSON 07/06/21'@3'$ :08 BY TW    Staphylococcus lugdunensis NOT DETECTED NOT DETECTED Final   Streptococcus species NOT DETECTED NOT DETECTED Final   Streptococcus agalactiae NOT DETECTED NOT DETECTED Final   Streptococcus pneumoniae NOT DETECTED NOT DETECTED Final   Streptococcus pyogenes NOT DETECTED NOT DETECTED Final   A.calcoaceticus-baumannii NOT DETECTED NOT DETECTED Final   Bacteroides fragilis NOT DETECTED NOT DETECTED Final   Enterobacterales NOT DETECTED NOT DETECTED Final   Enterobacter cloacae complex NOT DETECTED NOT DETECTED Final   Escherichia coli NOT DETECTED NOT DETECTED Final   Klebsiella aerogenes NOT DETECTED NOT DETECTED Final   Klebsiella oxytoca NOT DETECTED NOT DETECTED Final   Klebsiella pneumoniae NOT DETECTED NOT DETECTED Final   Proteus species NOT DETECTED NOT DETECTED Final   Salmonella species NOT DETECTED NOT DETECTED Final   Serratia marcescens NOT DETECTED NOT DETECTED Final   Haemophilus influenzae NOT DETECTED NOT DETECTED Final   Neisseria meningitidis NOT DETECTED NOT DETECTED Final   Pseudomonas aeruginosa NOT DETECTED NOT DETECTED Final    Stenotrophomonas maltophilia NOT DETECTED NOT DETECTED Final   Candida albicans NOT DETECTED NOT DETECTED Final   Candida auris NOT DETECTED NOT DETECTED Final   Candida glabrata NOT DETECTED NOT DETECTED Final  Candida krusei NOT DETECTED NOT DETECTED Final   Candida parapsilosis NOT DETECTED NOT DETECTED Final   Candida tropicalis NOT DETECTED NOT DETECTED Final   Cryptococcus neoformans/gattii NOT DETECTED NOT DETECTED Final   Methicillin resistance mecA/C DETECTED (A) NOT DETECTED Final    Comment: CRITICAL RESULT CALLED TO, READ BACK BY AND VERIFIED WITH: PHARMD ELLEN JACKSON 07/06/21'@3'$ :08 BY TW Performed at [facility], [place] [address], [place], [place] [address]   MRSA Next Gen by PCR, Nasal     Status: None   Collection Time: 07/06/21  9:50 PM   Specimen: Nasal Mucosa; Nasal Swab  Result Value Ref Range Status   MRSA by PCR Next Gen NOT DETECTED NOT DETECTED Final    Comment: (NOTE) The GeneXpert MRSA Assay (FDA approved for NASAL specimens only), is one component of a comprehensive MRSA colonization surveillance program. It is not intended to diagnose MRSA infection nor to guide or monitor treatment for MRSA infections. Test performance is not FDA approved in patients less than 64 years old. Performed at [facility], [place] [address] [place], [place] [address]   Culture, blood (Routine X 2) w Reflex to ID Panel     Status: None (Preliminary result)   Collection Time: 07/08/21  9:31 AM   Specimen: BLOOD  Result Value Ref Range Status   Specimen Description   Final    BLOOD BLOOD LEFT HAND Performed at [facility] [address], [place], [place] [address]    Special Requests   Final    IN PEDIATRIC BOTTLE Blood Culture adequate volume Performed at [facility] [address], [place], [place] [address]    Culture   Final    NO GROWTH 2 DAYS Performed at [facility] [address]  [place], [place] [address]    Report Status PENDING  Incomplete  Culture, blood (Routine X 2) w Reflex to ID Panel     Status: None (Preliminary result)   Collection Time: 07/08/21 10:10 AM   Specimen: BLOOD  Result Value Ref Range Status   Specimen Description   Final    BLOOD BLOOD RIGHT HAND Performed at [facility] [address], [place], [place] [address]    Special Requests   Final    BOTTLES DRAWN AEROBIC AND ANAEROBIC Blood Culture results may not be optimal due to an inadequate volume of blood received in culture bottles Performed at [facility] [address], [place], [place] [address]    Culture   Final    NO GROWTH 2 DAYS Performed at [facility] [address], [place],  [address]    Report Status PENDING  Incomplete          Radiology Studies: DG Chest 2 View  Result Date: 07/09/2021 CLINICAL DATA:  Shortness of breath. EXAM: CHEST - 2 VIEW COMPARISON:  07/06/2021 and prior studies FINDINGS: Pneumomediastinum is now identified, primarily on the LEFT. Improved airspace opacities within the LEFT lung noted. No definite pneumothorax is noted. The cardiomediastinal silhouette is otherwise unchanged with evidence of median sternotomy. IMPRESSION: 1. Pneumomediastinum, primarily on the LEFT. No definite pneumothorax. 2. Improved LEFT lung airspace opacities. These results will be called to the ordering clinician or representative by the Radiologist Assistant, and communication documented in the PACS or [employer]. Electronically Signed   By: Margarette Canada M.D.   On: 07/09/2021 12:19   CT Angio Chest Pulmonary Embolism (PE)  W or WO Contrast  Addendum Date: 07/10/2021   ADDENDUM REPORT: 07/10/2021 11:00 ADDENDUM: These results were called by telephone at the time of interpretation on 07/10/2021 at 11:00 am to provider Dr. Tyrell Antonio, Who verbally acknowledged these results. Electronically Signed   By: Zetta Bills M.D.    On: 07/10/2021 11:00   Result Date: 07/10/2021 CLINICAL DATA:  71 year old male presents for evaluation of shortness of breath and suspected pneumomediastinum. Also with known DVT. EXAM: CT ANGIOGRAPHY CHEST WITH CONTRAST TECHNIQUE: Multidetector CT imaging of the chest was performed using the standard protocol during bolus administration of intravenous contrast. Multiplanar CT image reconstructions and MIPs were obtained to evaluate the vascular anatomy. RADIATION DOSE REDUCTION: This exam was performed according to the departmental dose-optimization program which includes automated exposure control, adjustment of the mA and/or kV according to patient size and/or use of iterative reconstruction technique. CONTRAST:  110m OMNIPAQUE IOHEXOL 350 MG/ML SOLN COMPARISON:  CT of the chest from Jun 20, 2021. Chest x-ray of May 21 and Jul 10, 2021. FINDINGS: Cardiovascular: Calcified and noncalcified aortic atherosclerotic plaque. No aneurysmal dilation. Normal heart size without pericardial effusion. Central pulmonary vasculature is opacified of 410 Hounsfield units, adequate for assessment and negative for pulmonary embolism to the segmental level. Subsegmental branches particularly at the lung bases with limited evaluation due to respiratory motion. Mediastinum/Nodes: Extensive pneumomediastinum tracking into the low neck and involving the LEFT greater than RIGHT mediastinal border and extending along the esophagus and tracking underneath the crus of the LEFT hemidiaphragm but not extending into the abdomen. Small associated pneumothorax at the LEFT lung apex and anterior LEFT chest. No signs of mediastinal shift. No adenopathy in the chest. Postoperative changes of prior CABG as before. Median sternotomy changes. Lungs/Pleura: Patchy areas of ground-glass and consolidation that are worse on the LEFT have shown improvement since previous imaging particularly in the RIGHT chest but also in the LEFT chest. Pneumothorax  appears mildly loculated. There is a small associated LEFT-sided effusion with resolution of RIGHT-sided pleural fluid. Upper Abdomen: Pneumomediastinum does not extend into the upper abdomen but tracks beneath the LEFT diaphragmatic crus. Post cholecystectomy. Imaged portions of liver, pancreas, spleen, adrenal glands and kidneys without acute process. No acute gastrointestinal findings. Musculoskeletal: Post sternotomy. No acute bone finding. No destructive bone process. Spinal degenerative changes. Review of the MIP images confirms the above findings. IMPRESSION: 1. Negative for pulmonary embolism to the segmental level. 2. Extensive pneumomediastinum tracking into the low neck and involving the LEFT greater than RIGHT mediastinal border and extending along the esophagus and underneath the crus of the LEFT hemidiaphragm but not extending into the abdomen. 3. Etiology for above process is uncertain. Perhaps cough leading to barotrauma. Esophageal source is another differential consideration. No mediastinal fluid or focal thickening of the esophagus. This does not allow for exclusion of esophageal source but is perhaps reassuring 4. Small associated and partially loculated LEFT-sided pneumothorax at the apex, anteriorly and with a small loculated component in the major fissure. 5. Small LEFT-sided pleural effusion slightly diminished with resolution of RIGHT-sided effusion. 6. Improving airspace disease still with considerable ground-glass and septal thickening in the LEFT chest and worse at the LEFT lung base. 7. Aortic atherosclerosis, coronary artery disease and changes of CABG. Aortic Atherosclerosis (ICD10-I70.0). Electronically Signed: By: GZetta BillsM.D. On: 07/10/2021 10:52   DG CHEST PORT 1 VIEW  Result Date: 07/10/2021 CLINICAL DATA:  Dyspnea EXAM: PORTABLE CHEST 1 VIEW COMPARISON:  07/09/2021 chest radiograph. FINDINGS: Intact sternotomy wires.  CABG clips overlie the mediastinum. Normal heart  size. Previously questioned left-sided pneumomediastinum in the region of the aortic arch is less distinct on today's radiograph. No pneumothorax. No significant right pleural effusion. Small left pleural effusion peer is increased. Patchy hazy opacities throughout the left lung, slightly worsened. Mild reticular opacity at the right costophrenic angle is unchanged. IMPRESSION: 1. Small left pleural effusion, increased. 2. Patchy hazy opacities throughout the left lung, slightly worsened, compatible with pneumonia. 3. Previously questioned left-sided pneumomediastinum in the region of the aortic arch appears decreased. Electronically Signed   By: Ilona Sorrel M.D.   On: 07/10/2021 08:08   DG ESOPHAGUS W SINGLE CM (SOL OR THIN BA)  Result Date: 07/10/2021 CLINICAL DATA:  Pneumomediastinum.  Rule out esophageal perforation. EXAM: ESOPHOGRAM/BARIUM SWALLOW TECHNIQUE: Single contrast examination was performed using water-soluble contrast material. FLUOROSCOPY: Radiation Exposure Index (as provided by the fluoroscopic device): 6.30 mGy Kerma COMPARISON:  Chest CTA 07/10/2021 FINDINGS: A limited water-soluble esophagram was performed. The patient drank contrast without difficulty. The esophagus is normal in caliber, and contrast passed freely into the stomach. No contrast extravasation was observed to indicate an esophageal perforation. There is a small sliding hiatal hernia. IMPRESSION: No evidence of esophageal perforation. Electronically Signed   By: Logan Bores M.D.   On: 07/10/2021 13:55        Scheduled Meds:  brimonidine  1 drop Left Eye BID   carvedilol  3.125 mg Oral BID WC   chlorhexidine  15 mL Mouth Rinse BID   dapagliflozin propanediol  10 mg Oral Daily   dorzolamide-timolol  1 drop Both Eyes BID   ezetimibe  10 mg Oral Daily   feeding supplement  237 mL Oral BID BM   guaiFENesin  600 mg Oral BID   latanoprost  1 drop Both Eyes QHS   pantoprazole (PROTONIX) IV  40 mg Intravenous Q12H    predniSONE  60 mg Oral Q breakfast   sodium chloride (PF)       Continuous Infusions:  cefTRIAXone (ROCEPHIN)  IV 2 g (07/10/21 1356)   heparin 1,100 Units/hr (07/10/21 1058)   metronidazole 500 mg (07/10/21 0154)     LOS: 5 days    Time spent: 35 Minutes.     Elmarie Shiley, MD [facility]   If 7PM-7AM, please contact night-coverage [email or web address]  07/10/2021, 2:22 PM

## 2021-07-10 NOTE — Progress Notes (Signed)
Initial Nutrition Assessment  INTERVENTION:   -Ensure Plus High Protein po BID, each supplement provides 350 kcal and 20 grams of protein.   NUTRITION DIAGNOSIS:   Increased nutrient needs related to acute illness as evidenced by estimated needs.  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 Admitted for Acute hypoxic respiratory failure.  Patient in room with wife at bedside. Pt requesting privacy as on bedside commode. Pt's wife was able to provide history outside of room. Reports pt's appetite has improved. Ate 100% of breakfast this morning. At home pt typically has good appetite, consumes 3 meals a day. Will eat egg or peanut butter sandwiches, grilled cheese sandwiches, and then has a dinner that consists of a meat, starch, vegetable. Pt tries to consume a low sodium diet given h/o CHF. Pt is drinking Ensure supplements.  Per weight records, pt has lost 18 lbs since 2/23 (10% wt loss x 3 months, significant for time frame). Per wife, pt lost ~10 lbs during 10 days admission (4/30-5/10), which was likely fluid, pt received Lasix during that time.  Medications reviewed.  Labs reviewed:  Low Na  NUTRITION - FOCUSED PHYSICAL EXAM:  Will attempt at follow-up, Pt currently on bedside commode.   Diet Order:   Diet Order             Diet Heart Room service appropriate? Yes; Fluid consistency: Thin  Diet effective now                   EDUCATION NEEDS:   Not  appropriate for education at this time  Skin:  Skin Assessment: Reviewed RN Assessment  Last BM:  5/21  Height:   Ht Readings from Last 1 Encounters:  07/04/21 '5\' 9"'$  (1.753 m)    Weight:   Wt Readings from Last 1 Encounters:  07/09/21 66.3 kg    BMI:  Body mass index is 21.6 kg/m.  Estimated Nutritional Needs:   Kcal:  1700-1900  Protein:  75-90g  Fluid:  1.7L/day   Clayton Bibles, MS, RD, LDN Inpatient Clinical Dietitian Contact information available via Amion

## 2021-07-10 NOTE — Progress Notes (Signed)
NAME:  Roberto Ponce, MRN:  [id number], DOB:  1950/06/21, LOS: 5 ADMISSION DATE:  07/04/2021, CONSULTATION DATE:  07/04/2021 REFERRING MD:  Dr. 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  In evening of 07/04/21 he developed sudden onset of dyspnea associated with hypoxia with SpO2 in low 80s on room air.  He presented to ER and required Bipap. Work up consistent with acute RLE DVT.  Pertinent  Medical History  CAD, MVP with mod/severe MR, Testicular cancer s/p orchiectomy, Glaucoma, HLD  Significant Hospital Events:   5/16 Admit 5/18 Didn't need Bipap overnight.  Breathing better.   Tests:  Echo 5/17 >> EF 40 to 45%, mild LVH, mild elevation in PASP, mild/mod MR, mild/mod TR Doppler legs b/l 5/17 >> acute DVT Rt popliteal, posterior tibial, peroneal and gastrocnemius veins  Interim History / Subjective:   No acute events overnight He continues to feel better and his breathing is easier. No further bloody bowel movements  Chest radiograph this morning shows no left sided pneumomediastinum and with increased density of left lung opacities.  Objective   Blood pressure 112/63, pulse 69, temperature (!) 97.4 F (36.3 C), temperature source Oral, resp. rate 20, height '5\' 9"'$  (1.753 m), weight 66.3 kg, SpO2 100 %.        Intake/Output Summary (Last 24 hours) at 07/10/2021 0820 Last data filed at 07/10/2021 0735 Gross per 24 hour  Intake 1217.74 ml  Output 1025 ml  Net 192.74 ml   Filed Weights   07/07/21 0500 07/08/21 0500 07/09/21 0500  Weight: 64.5 kg 65.4 kg 66.3 kg    Examination: General: cachectic adult male sitting up in chair in NAD  HEENT: MM pink/moist, anicteric, [place] O2  Neuro: Awake, alert, speech clear, MAE.  Appears tired.  CV: s1s2 RRR, no m/r/g PULM:  non-labored at rest, crackles bilatearlly GI: soft, bsx4 active  Extremities: warm/dry, RLE >LLE (known DVT)   Skin: no rashes or lesions  Assessment & Plan:   Acute hypoxic respiratory failure. Pneumomediastinum Suspect combination of acute pulmonary edema and recurrence of post-viral BOOP and possible PE.  - Continue prednisone '60mg'$  daily -wean O2 for sats >90% -CTA Chest ordered today  - Left sided pneumomediastinum seems resolved. Possibly from esophageal perforation vs bleb rupture of the lung. Patient is asymptomatic at this time. We will continue to monitor with daily chest radiograph. If progresses, CT surgery will need to be consulted. Consider esophogram for further evaluation of esophageal perforation.  Acute Rt leg DVT in setting of testicular cancer and recent hospitalization with decreased mobility. - Continue heparin drip  -will need Cardiology to weigh in on combination of antiplatelet and DOAC he should be on  -CTA chest being ordered today  Concern for GI Bleeding - patient with bloody stools reported 5/21 - antiplatelets have been held, ok per cardiology. Will resume tomorrow if no further issues. - will keep anticoagulation going with heparin per patient and wife as they want to hold off on IVC filter at this time.  - continue to monitor H/H   Coronary artery disease s/p CABG, mod/severe MR, Acute on chronic systolic CHF, HLD. Followed by Dr.  Rudean Haskell -appreciate Cardiology assistance with patient care  -defer to Cards re: if he should remain on ASA + Plavix -continue coreg, plavix, farxiga,  zetia, lasix, spironolactone   Staph epidermidis in blood culture. 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 -fluid restriction  -follow sodium trend   Glaucoma. -continue eye drops   Hx of testicular cancer. Followed by Dr. Zola Button with oncology and Dr. Link Snuffer with [facility]  [facility] -patient is scheduled for surveillance CT imaging the first week of June; could do CT abd/pelvis while in hospital at same time he gets CT angiogram of his chest  Best Practice (right click and "Reselect all SmartList Selections" daily)  Diet/type: Regular consistency (see orders) DVT prophylaxis: systemic heparin GI prophylaxis: N/A Lines: N/A Foley:  N/A Code Status:  full code Last date of multidisciplinary goals of care discussion: updated wife at bedside 5/19 on plan of care  Labs       Latest Ref Rng & Units 07/09/2021    3:30 AM 07/08/2021    4:25 AM 07/07/2021    4:27 AM  CMP  Glucose 70 - 99 mg/dL 121   132   143    BUN 8 - 23 mg/dL 27   34   40    Creatinine 0.61 - 1.24 mg/dL 0.55   0.65   0.69    Sodium 135 - 145 mmol/L 130   131   128    Potassium 3.5 - 5.1 mmol/L 4.4   4.3   4.5    Chloride 98 - 111 mmol/L 93   94   90    CO2 22 - 32 mmol/L '30   31   29    '$ Calcium 8.9 - 10.3 mg/dL 8.4   8.4   8.4         Latest Ref Rng & Units 07/10/2021    3:26 AM 07/09/2021    8:37 PM 07/09/2021    3:11 PM  CBC  WBC 4.0 - 10.5 K/uL 16.5   18.9   21.0    Hemoglobin 13.0 - 17.0 g/dL 9.2   8.7   9.1    Hematocrit 39.0 - 52.0 % 27.7   26.4   26.7    Platelets 150 - 400 K/uL 392   416   439      ABG    Component Value Date/Time   PHART 7.46 (H) 07/04/2021 2220   PCO2ART 38 07/04/2021 2220   PO2ART 71 (L) 07/04/2021 2220   HCO3 27.0 07/04/2021 2220   TCO2 28 02/14/2021 1507   TCO2 27 02/14/2021 1507   ACIDBASEDEF 0.1 06/20/2021 0200   O2SAT 97 07/04/2021 2220    Signature:    Freda Jackson, MD [place] Office: [phone]   See Amion for personal pager PCCM on call pager [phone] until 7pm. Please call Elink 7p-7a. [phone]

## 2021-07-10 NOTE — Progress Notes (Signed)
[facility]enterology Progress Note  Roberto Ponce 71 y.o. 1950-06-10   Subjective: Seen and examined sitting in chair.  He denies abdominal pain.  No further bowel movements since last bloody bowel movement 2 days ago.  No hematemesis, hematochezia, melena.  Patient notes he is having urge to have a bowel movement this morning.  ROS : Review of Systems  Gastrointestinal:  Negative for abdominal pain, blood in stool, constipation, diarrhea, heartburn, melena, nausea and vomiting.  Genitourinary:  Negative for dysuria and urgency.    Objective: Vital signs in last 24 hours: Vitals:   07/10/21 0545 07/10/21 0856  BP: 112/63 108/60  Pulse: 69   Resp: 20   Temp: (!) 97.4 F (36.3 C)   SpO2: 100%     Physical Exam:  General:  Alert, cooperative, no distress, appears stated age, on [place] O2  Head:  Normocephalic, without obvious abnormality, atraumatic  Eyes:  Anicteric sclera, EOM's intact  Lungs:   respirations unlabored, crackles  Heart:  Regular rate and rhythm, S1, S2 normal  Abdomen:   Soft, non-tender, bowel sounds active all four quadrants,  no masses,   Extremities: Extremities normal, atraumatic  Pulses: 2+ and symmetric    Lab Results: Recent Labs    07/08/21 0931 07/09/21 0330 07/10/21 0837  NA  --  130* 131*  K  --  4.4 4.0  CL  --  93* 95*  CO2  --  30 29  GLUCOSE  --  121* 90  BUN  --  27* 21  CREATININE  --  0.55* 0.64  CALCIUM  --  8.4* 8.0*  MG 2.3  --   --   PHOS 4.0  --   --    No results for input(s): AST, ALT, ALKPHOS, BILITOT, PROT, ALBUMIN in the last 72 hours. Recent Labs    07/09/21 2037 07/10/21 0326  WBC 18.9* 16.5*  HGB 8.7* 9.2*  HCT 26.4* 27.7*  MCV 91.3 92.6  PLT 416* 392   No results for input(s): LABPROT, INR in the last 72 hours.    Assessment Hematochezia Significant mitral valve regurgitation with CHF Acute hypoxic respiratory failure pneumomediastinum Right leg DVT Anticoagulation SIADH  Patient with no  further episodes of rectal bleeding.  Hemoglobin stable at 9.2 increased from 8.7 yesterday.  We will evaluate for blood with next bowel movement.  At this time no significant bleeding.  Patient's clopidogrel was held and started on heparin IV starting 5/21.   Plan: No plan for procedure at this time.  Rectal bleeding seems to have improved.  We will continue to monitor.  Continue supportive care Continue pantoprazole 40 mg twice daily [facility] will follow.  Arvella Nigh Khalik Pewitt PA-C 07/10/2021, 9:54 AM  Contact #  [phone]

## 2021-07-11 ENCOUNTER — Inpatient Hospital Stay (HOSPITAL_COMMUNITY): Payer: PPO

## 2021-07-11 DIAGNOSIS — J9601 Acute respiratory failure with hypoxia: Secondary | ICD-10-CM | POA: Diagnosis not present

## 2021-07-11 DIAGNOSIS — I350 Nonrheumatic aortic (valve) stenosis: Secondary | ICD-10-CM | POA: Diagnosis not present

## 2021-07-11 DIAGNOSIS — I2510 Atherosclerotic heart disease of native coronary artery without angina pectoris: Secondary | ICD-10-CM | POA: Diagnosis not present

## 2021-07-11 DIAGNOSIS — I340 Nonrheumatic mitral (valve) insufficiency: Secondary | ICD-10-CM | POA: Diagnosis not present

## 2021-07-11 LAB — BASIC METABOLIC PANEL
Anion gap: 4 — ABNORMAL LOW (ref 5–15)
BUN: 21 mg/dL (ref 8–23)
CO2: 29 mmol/L (ref 22–32)
Calcium: 8.3 mg/dL — ABNORMAL LOW (ref 8.9–10.3)
Chloride: 98 mmol/L (ref 98–111)
Creatinine, Ser: 0.63 mg/dL (ref 0.61–1.24)
GFR, Estimated: >60 mL/min (ref >60)
Glucose, Bld: 114 mg/dL — ABNORMAL HIGH (ref 70–99)
Potassium: 4.6 mmol/L (ref 3.5–5.1)
Sodium: 131 mmol/L — ABNORMAL LOW (ref 135–145)

## 2021-07-11 LAB — CBC
HCT: 28.1 % — ABNORMAL LOW (ref 39.0–52.0)
HCT: 26.5 % — ABNORMAL LOW (ref 39.0–52.0)
HCT: 28.9 % — ABNORMAL LOW (ref 39.0–52.0)
Hemoglobin: 9.1 g/dL — ABNORMAL LOW (ref 13.0–17.0)
Hemoglobin: 8.6 g/dL — ABNORMAL LOW (ref 13.0–17.0)
Hemoglobin: 9.2 g/dL — ABNORMAL LOW (ref 13.0–17.0)
MCH: 30.2 pg (ref 26.0–34.0)
MCH: 30.3 pg (ref 26.0–34.0)
MCH: 29.7 pg (ref 26.0–34.0)
MCHC: 32.4 g/dL (ref 30.0–36.0)
MCHC: 32.5 g/dL (ref 30.0–36.0)
MCHC: 31.8 g/dL (ref 30.0–36.0)
MCV: 93.4 fL (ref 80.0–100.0)
MCV: 93.3 fL (ref 80.0–100.0)
MCV: 93.2 fL (ref 80.0–100.0)
Platelets: 456 10*3/uL — ABNORMAL HIGH (ref 150–400)
Platelets: 424 K/uL — ABNORMAL HIGH (ref 150–400)
Platelets: 497 10*3/uL — ABNORMAL HIGH (ref 150–400)
RBC: 3.01 MIL/uL — ABNORMAL LOW (ref 4.22–5.81)
RBC: 2.84 MIL/uL — ABNORMAL LOW (ref 4.22–5.81)
RBC: 3.1 MIL/uL — ABNORMAL LOW (ref 4.22–5.81)
RDW: 17.5 % — ABNORMAL HIGH (ref 11.5–15.5)
RDW: 17.3 % — ABNORMAL HIGH (ref 11.5–15.5)
RDW: 17.9 % — ABNORMAL HIGH (ref 11.5–15.5)
WBC: 16.9 10*3/uL — ABNORMAL HIGH (ref 4.0–10.5)
WBC: 17.6 K/uL — ABNORMAL HIGH (ref 4.0–10.5)
WBC: 14.1 10*3/uL — ABNORMAL HIGH (ref 4.0–10.5)
nRBC: 0 % (ref 0.0–0.2)
nRBC: 0.2 % (ref 0.0–0.2)
nRBC: 0.1 % (ref 0.0–0.2)

## 2021-07-11 LAB — HEPARIN LEVEL (UNFRACTIONATED): Heparin Unfractionated: 0.89 [IU]/mL — ABNORMAL HIGH (ref 0.30–0.70)

## 2021-07-11 MED ORDER — PREDNISONE 5 MG PO TABS: 5.0000 mg | ORAL_TABLET | Freq: Every day | ORAL | Status: DC

## 2021-07-11 MED ORDER — PREDNISONE 20 MG PO TABS
40.0000 mg | ORAL_TABLET | Freq: Every day | ORAL | Status: DC
  Administered 2021-07-11 – 2021-07-12 (×2): 40 mg via ORAL
  Filled 2021-07-11 (×2): qty 2

## 2021-07-11 MED ORDER — ENOXAPARIN SODIUM 80 MG/0.8ML IJ SOSY
65.0000 mg | PREFILLED_SYRINGE | Freq: Two times a day (BID) | INTRAMUSCULAR | Status: DC
  Administered 2021-07-11 – 2021-07-12 (×2): 65 mg via SUBCUTANEOUS
  Filled 2021-07-11 (×2): qty 0.8

## 2021-07-11 MED ORDER — ENOXAPARIN SODIUM 80 MG/0.8ML IJ SOSY
65.0000 mg | PREFILLED_SYRINGE | Freq: Once | INTRAMUSCULAR | Status: AC
  Administered 2021-07-11: 65 mg via SUBCUTANEOUS
  Filled 2021-07-11: qty 0.8

## 2021-07-11 MED ORDER — PREDNISONE 20 MG PO TABS: 20.0000 mg | ORAL_TABLET | Freq: Every day | ORAL | Status: DC

## 2021-07-11 MED ORDER — PREDNISONE 10 MG PO TABS: 10.0000 mg | ORAL_TABLET | Freq: Every day | ORAL | Status: DC

## 2021-07-11 MED ORDER — PREDNISONE 10 MG PO TABS: 30.0000 mg | ORAL_TABLET | Freq: Every day | ORAL | Status: DC

## 2021-07-11 MED ORDER — PREDNISONE 5 MG PO TABS
2.5000 mg | ORAL_TABLET | Freq: Every day | ORAL | Status: DC
Start: 2021-08-05 — End: 2021-07-12

## 2021-07-11 NOTE — Progress Notes (Signed)
ANTICOAGULATION CONSULT NOTE - Follow Up Consult  Pharmacy Consult for Lovenox (change from IV heparin) Indication: acute DVT  No Known Allergies  Patient Measurements: Height: '5\' 9"'$  (175.3 cm) Weight: 66.3 kg (146 lb 4.1 oz) IBW/kg (Calculated) : 70.7 Heparin Dosing Weight: 66 kg  Vital Signs: Temp: 97.7 F (36.5 C) (05/23 0545) Temp Source: Oral (05/23 0545) BP: 109/57 (05/23 0545) Pulse Rate: 68 (05/23 0545)  Labs: Recent Labs    07/09/21 0330 07/09/21 1005 07/10/21 0837 07/10/21 1247 07/10/21 1953 07/10/21 2058 07/11/21 0347  HGB 8.9*   < >  --  9.8* 8.8*  --  9.1*  HCT 26.4*   < >  --  30.1* 27.1*  --  28.1*  PLT 421*   < >  --  556* 451*  --  456*  HEPARINUNFRC  --    < >  --  0.47  --  0.58 0.89*  CREATININE 0.55*  --  0.64  --   --   --  0.63   < > = values in this interval not displayed.    Estimated Creatinine Clearance: 80.6 mL/min (by C-G formula based on SCr of 0.63 mg/dL).   Medications:  Medications Prior to Admission  Medication Sig Dispense Refill Last Dose   acetaminophen (TYLENOL) 500 MG tablet Take 1,000 mg by mouth every 6 (six) hours as needed for moderate pain.   Past Week   aspirin EC 81 MG tablet Take 1 tablet (81 mg total) by mouth daily.   07/04/2021   brimonidine (ALPHAGAN) 0.2 % ophthalmic solution Place 1 drop into the left eye 2 (two) times daily.   07/04/2021 at am   carvedilol (COREG) 3.125 MG tablet Take 1 tablet (3.125 mg total) by mouth 2 (two) times daily with a meal. 60 tablet 2 07/04/2021 at 0930   clopidogrel (PLAVIX) 75 MG tablet Take 1 tablet (75 mg total) by mouth daily with breakfast. 90 tablet 3 07/04/2021   dapagliflozin propanediol (FARXIGA) 10 MG TABS tablet Take 1 tablet (10 mg total) by mouth daily. 30 tablet 2 07/04/2021   dorzolamide-timolol (COSOPT) 22.3-6.8 MG/ML ophthalmic solution Place 1 drop into both eyes 2 (two) times daily.   07/04/2021 at am   ezetimibe (ZETIA) 10 MG tablet TAKE 1 TABLET(10 MG) BY MOUTH DAILY  (Patient taking differently: Take 10 mg by mouth daily.) 90 tablet 1 07/04/2021   furosemide (LASIX) 40 MG tablet Take 1 tablet (40 mg total) by mouth 2 (two) times daily. 60 tablet 3 07/04/2021 at am   latanoprost (XALATAN) 0.005 % ophthalmic solution Place 1 drop into both eyes at bedtime.    07/03/2021   SODIUM FLUORIDE 5000 PPM 1.1 % PSTE Apply 1 application. topically in the morning and at bedtime.   07/04/2021   spironolactone (ALDACTONE) 25 MG tablet Take 0.5 tablets (12.5 mg total) by mouth daily. 30 tablet 2 07/04/2021 at am   triamcinolone cream (KENALOG) 0.1 % Apply 1 application topically 2 (two) times daily as needed (eczema).   Past Week   nitroGLYCERIN (NITROSTAT) 0.4 MG SL tablet Place 1 tablet (0.4 mg total) under the tongue every 5 (five) minutes as needed for chest pain. (Patient not taking: Reported on 07/05/2021) 25 tablet 3 Not Taking   Scheduled:   brimonidine  1 drop Left Eye BID   carvedilol  3.125 mg Oral BID WC   chlorhexidine  15 mL Mouth Rinse BID   clopidogrel  75 mg Oral Daily   dapagliflozin propanediol  10 mg Oral Daily   dorzolamide-timolol  1 drop Both Eyes BID   enoxaparin (LOVENOX) injection  65 mg Subcutaneous Once   enoxaparin (LOVENOX) injection  65 mg Subcutaneous Q12H   ezetimibe  10 mg Oral Daily   feeding supplement  237 mL Oral BID BM   guaiFENesin  600 mg Oral BID   latanoprost  1 drop Both Eyes QHS   pantoprazole (PROTONIX) IV  40 mg Intravenous Q12H   predniSONE  40 mg Oral Q breakfast   Followed by   Derrill Memo ON 07/16/2021] predniSONE  30 mg Oral Q breakfast   Followed by   Derrill Memo ON 07/21/2021] predniSONE  20 mg Oral Q breakfast   Followed by   Derrill Memo ON 07/26/2021] predniSONE  10 mg Oral Q breakfast   Followed by   Derrill Memo ON 07/31/2021] predniSONE  5 mg Oral Q breakfast   Followed by   Derrill Memo ON 08/05/2021] predniSONE  2.5 mg Oral Q breakfast    Assessment: Pt is a 74 YOM recently hospitalized with respiratory failure from viral pneumonia,  presented to ED on 07/04/21 with dyspnea and hypoxia with SpO2 in low 80s on room air. Pt was found to have elevated d-dimer but was unable to have chest CTA on admission due to AKI. Started on Lovenox for suspected PE on admission. Lower extremity doppler on 5/17 showed RLE DVT. Pt was noted to have blood in stool on 5/21 with anticoagulation change from Lovenox to IV heparin. Scr improved on 5/22 with completed chest CTA negative for PE. Per GI, rectal bleeding has improved and plan is to continue supportive care at this time. Pharmacy has been consulted on 5/23 to convert pt back to [place].  Today 07/11/21: -Hgb 8.8>9.1, Plt 456 -RN reports multiple dark stools  Goal of Therapy:  Anti-Xa level 0.6-1 units/ml 4hrs after LMWH dose given Monitor platelets by anticoagulation protocol: Yes   Plan:  -Discontinue heparin infusion -Initiate lovenox 65 mg BID  -Change CBC to Q72H after 5/24 -Monitor for further rectal bleeding  Rodolph Bong, PharmD Candidate 07/11/2021 11:32 AM

## 2021-07-11 NOTE — Progress Notes (Signed)
Per Dr Tyrell Antonio reached out to cardiology regarding home lasix dose. Dr Johnsie Cancel does not want to resume lasix at this time.

## 2021-07-11 NOTE — Progress Notes (Signed)
[facility] Progress Note  Roberto Ponce 71 y.o. 18-Aug-1950   Subjective: Patient seen and examined laying in bed. Tolerating food well. Denies abdominal pain. 2 non bloody bowel movements this morning.   ROS : Review of Systems  Gastrointestinal:  Negative for abdominal pain, blood in stool, constipation, diarrhea, heartburn, melena, nausea and vomiting.  Genitourinary:  Negative for dysuria and urgency.     Objective: Vital signs in last 24 hours: Vitals:   07/10/21 2353 07/11/21 0545  BP: 99/62 (!) 109/57  Pulse: 77 68  Resp: 18 20  Temp: 97.6 F (36.4 C) 97.7 F (36.5 C)  SpO2: 100% 97%    Physical Exam:  General:  Alert, cooperative, no distress, appears stated age  Head:  Normocephalic, without obvious abnormality, atraumatic  Eyes:  Anicteric sclera, EOM's intact  Lungs:   Clear to auscultation bilaterally, respirations unlabored  Heart:  Regular rate and rhythm, S1, S2 normal  Abdomen:   Soft, non-tender, bowel sounds active all four quadrants,  no masses,   Extremities: Extremities normal, atraumatic, no  edema  Pulses: 2+ and symmetric    Lab Results: Recent Labs    07/10/21 0837 07/11/21 0347  NA 131* 131*  K 4.0 4.6  CL 95* 98  CO2 29 29  GLUCOSE 90 114*  BUN 21 21  CREATININE 0.64 0.63  CALCIUM 8.0* 8.3*   No results for input(s): AST, ALT, ALKPHOS, BILITOT, PROT, ALBUMIN in the last 72 hours. Recent Labs    07/11/21 0347 07/11/21 1138  WBC 16.9* 17.6*  HGB 9.1* 8.6*  HCT 28.1* 26.5*  MCV 93.4 93.3  PLT 456* 424*   No results for input(s): LABPROT, INR in the last 72 hours.    Assessment Hematochezia  Significant mitral valve regurgitation with CHF Acute hypoxic respiratory failure pneumomediastinum Right leg DVT Anticoagulation SIADH   Patient with no further episodes of rectal bleeding.  Hemoglobin stable at 9.1 increased from 8.7 yesterday.  We will evaluate for blood with next bowel movement.  At this time no  significant bleeding.  Patient's clopidogrel was held and started on heparin IV starting 5/21.    Plan: No plan for procedure at this time.  Rectal bleeding seems to have improved.  We will continue to monitor.  Continue supportive care Continue pantoprazole 40 mg twice daily [facility] will sign off.   Arvella Nigh [place] PA-C 07/11/2021, 3:11 PM  Contact #  [phone]

## 2021-07-11 NOTE — Progress Notes (Signed)
PROGRESS NOTE    Roberto Ponce  IZT:[id number] DOB: 1950-12-17 DOA: 07/04/2021 PCP: Kristen Loader, FNP   Brief Narrative: 71 year old with past medical history significant for CAD, MVP with moderate to severe MR, testicular cancer s/p orchiectomy, glaucoma, hyperlipidemia, recent admission from 06/18/2021 until 06/28/2021 with respiratory failure from viral pneumonia with post Viral BOOP, acute pulmonary edema and hyponatremia from SIADH.  The evening of 07/04/2021 he developed sudden onset of dyspnea associated with hypoxia with oxygen saturation as low as 80% on room air.  He presented to the ER and required BiPAP.  Work-up also consistent with acute right lower extremity DVT.  Patient care transferred to [facility] 5/20. Patient develop bloody stool, started  IV Protonix. Lovenox change to heparin. He was found to have pneumomediastinum. Esophagogram was negative for esophageal perforation. Plan to monitor.   HB has remain stable. He was transition back to Lovenox. Pneumomediastinum is stable on X ray 5/23.   Assessment & Plan:   Principal Problem:   Acute hypoxemic respiratory failure (HCC) Active Problems:   Hyperlipidemia   Coronary artery disease   Aortic stenosis   Hyponatremia   Glaucoma   History of testicular cancer   History of SIADH   DVT (deep venous thrombosis) (HCC)   1-Acute hypoxic respiratory failure Thought to be secondary to acute pulmonary edema and recurrence of postviral BOOP. Solu-Medrol-- change to prednisone. Needs Prednisone Taper 40 mg daily dropping down by 10 mg every 5 days.  Continue to wean oxygen as tolerated Chest X ray showed Pneumomediastinum. He is asymptomatic. Esophagogram negative for perforation.  Pulmonologist recommend-- monitor clinically.  Chest x ray 5/23 finding stable.   2-Acute right lower extremity DVT, in the setting of testicular cancer and recent hospitalization with decreased mobility. -Echo no evidence of RV strain.  -He  was transition to Heparin-- due to concern for GI bleed on 5/21. -CCM recommended transition back to Lovenox 5/23. Continue to monitor Hb.    3-CAD status post CABG, moderate to severe MR, acute on chronic systolic heart failure, hyperlipidemia -Appreciate cardiology assistance. -Defer to cardiology antiplatelet recommendation; patient is a on aspirin and Plavix -Continue with Coreg  Farxiga.  Lasix and spironolactone held by cardiology.  -Plavix resume 5/22 by cardiology. Monitor hb.   4-Concern GI bleed;  Had [employer] stool mix with red dark blood on 5/21. Report Dark brown stool 5/23. Monitor hb.  Plavix held for one day. Plavix resume by cardiology 5/22. Continue with IV Protonix  CBC every 8 hours.  Hb stable , continue to monitor.   5-Pneumomediastinum:  -Differential small air way burst v s blebs burst vs esophageal perforation.  -Pulmonologist, recommend IV ceftriaxone and flagyl.  -CT angio: negative for PE; showed small left side pneumothorax, moderate amount of Air mediastinum. CT scan results discussed with Pulmonologist and GI.  -Esophagogram : No evidence of esophageal perforation.  -Chest x ray stable 5/23. -Pulmonologist Following.   Staph epidermidis in blood cultures: 2 out of 4 bottles: Suspected contaminant Repeated blood cultures 5/20: no growth to date.   -Hyponatremia from SIADH Continue with fluid restriction Lasix on hold.   -Glaucoma: Continue with eyedrop -History of testicular cancer: Need to follow-up with Dr. Alen Blew  -Leukocytosis; in setting steroid.      Estimated body mass index is 21.6 kg/m as calculated from the following:   Height as of this encounter: '5\' 9"'$  (1.753 m).   Weight as of this encounter: 66.3 kg.   DVT prophylaxis: Lovenox Code Status:  Full code Family Communication: Discussed with wife who was at bedside Disposition Plan:  Status is: Inpatient Remains inpatient appropriate because: Management of respiratory failure.   PT OT consulted    Consultants:  Cardiology  CCM  Procedures:  ECHO  Antimicrobials:    Subjective: He had BM dark brown at 3 am and later in the morning.  He denies abdominal pain.  He is  breathing better. Feels he is improving.   Objective: Vitals:   07/10/21 1702 07/10/21 2118 07/10/21 2353 07/11/21 0545  BP: (!) 117/57 116/62 99/62 (!) 109/57  Pulse: 95 89 77 68  Resp: '17 20 18 20  '$ Temp: 97.8 F (36.6 C) 97.8 F (36.6 C) 97.6 F (36.4 C) 97.7 F (36.5 C)  TempSrc: Oral  Oral Oral  SpO2: 100% 100% 100% 97%  Weight:      Height:        Intake/Output Summary (Last 24 hours) at 07/11/2021 1256 Last data filed at 07/11/2021 0900 Gross per 24 hour  Intake 925.46 ml  Output 925 ml  Net 0.46 ml    Filed Weights   07/07/21 0500 07/08/21 0500 07/09/21 0500  Weight: 64.5 kg 65.4 kg 66.3 kg    Examination:  General exam: NAD Respiratory system: BL air movement Cardiovascular system: S 1, S 2 Gastrointestinal system: BS present, soft  Central nervous system: Alert, conversant Extremities: Trace edema RLE    Data Reviewed: I have personally reviewed following labs and imaging studies  CBC: Recent Labs  Lab 07/04/21 2210 07/06/21 0238 07/10/21 0326 07/10/21 1247 07/10/21 1953 07/11/21 0347 07/11/21 1138  WBC 13.2*   < > 16.5* 20.6* 16.4* 16.9* 17.6*  NEUTROABS 10.6*  --   --   --   --   --   --   HGB 12.3*   < > 9.2* 9.8* 8.8* 9.1* 8.6*  HCT 35.5*   < > 27.7* 30.1* 27.1* 28.1* 26.5*  MCV 87.0   < > 92.6 92.9 93.1 93.4 93.3  PLT 506*   < > 392 556* 451* 456* 424*   < > = values in this interval not displayed.    Basic Metabolic Panel: Recent Labs  Lab 07/07/21 0427 07/08/21 0425 07/08/21 0931 07/09/21 0330 07/10/21 0837 07/11/21 0347  NA 128* 131*  --  130* 131* 131*  K 4.5 4.3  --  4.4 4.0 4.6  CL 90* 94*  --  93* 95* 98  CO2 29 31  --  '30 29 29  '$ GLUCOSE 143* 132*  --  121* 90 114*  BUN 40* 34*  --  27* 21 21  CREATININE 0.69 0.65   --  0.55* 0.64 0.63  CALCIUM 8.4* 8.4*  --  8.4* 8.0* 8.3*  MG  --   --  2.3  --   --   --   PHOS  --   --  4.0  --   --   --     GFR: Estimated Creatinine Clearance: 80.6 mL/min (by C-G formula based on SCr of 0.63 mg/dL). Liver Function Tests: Recent Labs  Lab 07/04/21 2210  AST 47*  ALT 73*  ALKPHOS 106  BILITOT 0.9  PROT 7.9  ALBUMIN 2.6*    No results for input(s): LIPASE, AMYLASE in the last 168 hours. No results for input(s): AMMONIA in the last 168 hours. Coagulation Profile: Recent Labs  Lab 07/04/21 2210  INR 1.1    Cardiac Enzymes: No results for input(s): CKTOTAL, CKMB, CKMBINDEX, TROPONINI in the  last 168 hours. BNP (last 3 results) No results for input(s): PROBNP in the last 8760 hours. HbA1C: No results for input(s): HGBA1C in the last 72 hours. CBG: No results for input(s): GLUCAP in the last 168 hours. Lipid Profile: No results for input(s): CHOL, HDL, LDLCALC, TRIG, CHOLHDL, LDLDIRECT in the last 72 hours. Thyroid Function Tests: No results for input(s): TSH, T4TOTAL, FREET4, T3FREE, THYROIDAB in the last 72 hours. Anemia Panel: No results for input(s): VITAMINB12, FOLATE, FERRITIN, TIBC, IRON, RETICCTPCT in the last 72 hours. Sepsis Labs: Recent Labs  Lab 07/04/21 2350 07/05/21 0250 07/06/21 0238  PROCALCITON <0.10 <0.10 <0.10     Recent Results (from the past 240 hour(s))  Resp Panel by RT-PCR (Flu A&B, Covid) Nasopharyngeal Swab     Status: None   Collection Time: 07/04/21 10:10 PM   Specimen: Nasopharyngeal Swab; Nasopharyngeal(NP) swabs in vial transport medium  Result Value Ref Range Status   SARS Coronavirus 2 by RT PCR NEGATIVE NEGATIVE Final    Comment: (NOTE) SARS-CoV-2 target nucleic acids are NOT DETECTED.  The SARS-CoV-2 RNA is generally detectable in upper respiratory specimens during the acute phase of infection. The lowest concentration of SARS-CoV-2 viral copies this assay can detect is 138 copies/mL. A negative  result does not preclude SARS-Cov-2 infection and should not be used as the sole basis for treatment or other patient management decisions. A negative result may occur with  improper specimen collection/handling, submission of specimen other than nasopharyngeal swab, presence of viral mutation(s) within the areas targeted by this assay, and inadequate number of viral copies(<138 copies/mL). A negative result must be combined with clinical observations, patient history, and epidemiological information. The expected result is Negative.  Fact Sheet for Patients:  EntrepreneurPulse.com.au  Fact Sheet for Healthcare Providers:  IncredibleEmployment.be  This test is no t yet approved or cleared by the [place] FDA and  has been authorized for detection and/or diagnosis of SARS-CoV-2 by FDA under an Emergency Use Authorization (EUA). This EUA will remain  in effect (meaning this test can be used) for the duration of the COVID-19 declaration under Section 564(b)(1) of the Act, 21 U.S.C.section 360bbb-3(b)(1), unless the authorization is terminated  or revoked sooner.       Influenza A by PCR NEGATIVE NEGATIVE Final   Influenza B by PCR NEGATIVE NEGATIVE Final    Comment: (NOTE) The Xpert Xpress SARS-CoV-2/FLU/RSV plus assay is intended as an aid in the diagnosis of influenza from Nasopharyngeal swab specimens and should not be used as a sole basis for treatment. Nasal washings and aspirates are unacceptable for Xpert Xpress SARS-CoV-2/FLU/RSV testing.  Fact Sheet for Patients: EntrepreneurPulse.com.au  Fact Sheet for Healthcare Providers: IncredibleEmployment.be  This test is not yet approved or cleared by the [place] FDA and has been authorized for detection and/or diagnosis of SARS-CoV-2 by FDA under an Emergency Use Authorization (EUA). This EUA will remain in effect (meaning this test can be used)  for the duration of the COVID-19 declaration under Section 564(b)(1) of the Act, 21 U.S.C. section 360bbb-3(b)(1), unless the authorization is terminated or revoked.  Performed at [facility], [place] [address], [place], [place] [address]   Culture, blood (Routine X 2) w Reflex to ID Panel     Status: None   Collection Time: 07/05/21  1:00 AM   Specimen: BLOOD  Result Value Ref Range Status   Specimen Description   Final    BLOOD BLOOD RIGHT WRIST Performed at [facility], [address] [place], [place] [address]    Special Requests   Final    BOTTLES DRAWN AEROBIC AND ANAEROBIC Blood Culture adequate volume Performed at [facility] [address], [place], [place] [address]    Culture   Final    NO GROWTH 5 DAYS Performed at [facility], [place] [address] [place], [place] [address]    Report Status 07/10/2021 FINAL  Final  Culture, blood (Routine X 2) w Reflex to ID Panel     Status: Abnormal   Collection Time: 07/05/21  1:07 AM   Specimen: BLOOD  Result Value Ref Range Status   Specimen Description   Final    BLOOD LEFT ANTECUBITAL Performed at [facility] [address] [place], [place] [address]    Special Requests   Final    BOTTLES DRAWN AEROBIC AND ANAEROBIC Blood Culture adequate volume Performed at [facility] [address], [place], [place] [address]    Culture  Setup Time   Final    GRAM POSITIVE COCCI IN BOTH AEROBIC AND ANAEROBIC BOTTLES CRITICAL RESULT CALLED TO, READ BACK BY AND VERIFIED WITH: PHARMD ELLEN JACKSON 07/06/21'@3'$ :08 BY TW    Culture (A)  Final    STAPHYLOCOCCUS EPIDERMIDIS THE SIGNIFICANCE OF ISOLATING THIS ORGANISM FROM A SINGLE SET OF BLOOD CULTURES WHEN MULTIPLE SETS ARE DRAWN IS UNCERTAIN. PLEASE NOTIFY THE MICROBIOLOGY DEPARTMENT WITHIN ONE WEEK IF SPECIATION AND SENSITIVITIES ARE REQUIRED. Performed at [facility], [place] [address], [place],  [address]    Report Status 07/08/2021 FINAL  Final  Blood Culture ID Panel (Reflexed)     Status: Abnormal   Collection Time: 07/05/21  1:07 AM  Result Value Ref Range Status   Enterococcus faecalis NOT DETECTED NOT DETECTED Final   Enterococcus Faecium NOT DETECTED NOT DETECTED Final   Listeria monocytogenes NOT DETECTED NOT DETECTED Final   Staphylococcus species DETECTED (A) NOT DETECTED Final    Comment: CRITICAL RESULT CALLED TO, READ BACK BY AND VERIFIED WITH: PHARMD ELLEN JACKSON 07/06/21'@3'$ :08 BY TW    Staphylococcus aureus (BCID) NOT DETECTED NOT DETECTED Final   Staphylococcus epidermidis DETECTED (A) NOT DETECTED Final    Comment: Methicillin (oxacillin) resistant coagulase negative staphylococcus. Possible blood culture contaminant (unless isolated from more than one blood culture draw or clinical case suggests pathogenicity). No antibiotic treatment is indicated for blood  culture contaminants. CRITICAL RESULT CALLED TO, READ BACK BY AND VERIFIED WITH: PHARMD ELLEN JACKSON 07/06/21'@3'$ :08 BY TW    Staphylococcus lugdunensis NOT DETECTED NOT DETECTED Final   Streptococcus species NOT DETECTED NOT DETECTED Final   Streptococcus agalactiae NOT DETECTED NOT DETECTED Final   Streptococcus pneumoniae NOT DETECTED NOT DETECTED Final   Streptococcus pyogenes NOT DETECTED NOT DETECTED Final   A.calcoaceticus-baumannii NOT DETECTED NOT DETECTED Final   Bacteroides fragilis NOT DETECTED NOT DETECTED Final   Enterobacterales NOT DETECTED NOT DETECTED Final   Enterobacter cloacae complex NOT DETECTED NOT DETECTED Final   Escherichia coli NOT DETECTED NOT DETECTED Final   Klebsiella aerogenes NOT DETECTED NOT DETECTED Final   Klebsiella oxytoca NOT DETECTED NOT DETECTED Final   Klebsiella pneumoniae NOT DETECTED NOT DETECTED Final   Proteus species NOT DETECTED NOT DETECTED Final   Salmonella species NOT DETECTED NOT DETECTED Final   Serratia marcescens NOT  DETECTED NOT DETECTED Final   Haemophilus influenzae NOT DETECTED NOT DETECTED Final   Neisseria meningitidis NOT DETECTED NOT DETECTED Final   Pseudomonas aeruginosa NOT  DETECTED NOT DETECTED Final   Stenotrophomonas maltophilia NOT DETECTED NOT DETECTED Final   Candida albicans NOT DETECTED NOT DETECTED Final   Candida auris NOT DETECTED NOT DETECTED Final   Candida glabrata NOT DETECTED NOT DETECTED Final   Candida krusei NOT DETECTED NOT DETECTED Final   Candida parapsilosis NOT DETECTED NOT DETECTED Final   Candida tropicalis NOT DETECTED NOT DETECTED Final   Cryptococcus neoformans/gattii NOT DETECTED NOT DETECTED Final   Methicillin resistance mecA/C DETECTED (A) NOT DETECTED Final    Comment: CRITICAL RESULT CALLED TO, READ BACK BY AND VERIFIED WITH: PHARMD ELLEN JACKSON 07/06/21'@3'$ :08 BY TW Performed at [facility], [address], [place], [place] [address]   MRSA Next Gen by PCR, Nasal     Status: None   Collection Time: 07/06/21  9:50 PM   Specimen: Nasal Mucosa; Nasal Swab  Result Value Ref Range Status   MRSA by PCR Next Gen NOT DETECTED NOT DETECTED Final    Comment: (NOTE) The GeneXpert MRSA Assay (FDA approved for NASAL specimens only), is one component of a comprehensive MRSA colonization surveillance program. It is not intended to diagnose MRSA infection nor to guide or monitor treatment for MRSA infections. Test performance is not FDA approved in patients less than 32 years old. Performed at [facility] [place] [address], [place], [place] [address]   Culture, blood (Routine X 2) w Reflex to ID Panel     Status: None (Preliminary result)   Collection Time: 07/08/21  9:31 AM   Specimen: BLOOD  Result Value Ref Range Status   Specimen Description   Final    BLOOD BLOOD LEFT HAND Performed at [facility] [address], [place], [place] [address]    Special Requests   Final    IN PEDIATRIC BOTTLE Blood Culture  adequate volume Performed at [facility] [address], [place], [place] [address]    Culture   Final    NO GROWTH 3 DAYS Performed at [facility], [place] [address], [place], [place] [address]    Report Status PENDING  Incomplete  Culture, blood (Routine X 2) w Reflex to ID Panel     Status: None (Preliminary result)   Collection Time: 07/08/21 10:10 AM   Specimen: BLOOD  Result Value Ref Range Status   Specimen Description   Final    BLOOD BLOOD RIGHT HAND Performed at [facility] [address] [place], [place] [address]    Special Requests   Final    BOTTLES DRAWN AEROBIC AND ANAEROBIC Blood Culture results may not be optimal due to an inadequate volume of blood received in culture bottles Performed at [facility] [address] [place], [place] [address]    Culture   Final    NO GROWTH 3 DAYS Performed at [facility], [place] [address] [place], [place] [address]    Report Status PENDING  Incomplete          Radiology Studies: DG Chest 2 View  Result Date: 07/11/2021 CLINICAL DATA:  Follow-up.  Pneumo mediastinum. EXAM: CHEST - 2 VIEW COMPARISON:  07/10/2021. FINDINGS: Stable cardiomediastinal contours. Pneumomediastinum is again noted bilaterally. Gas is noted tracking into the lower neck as before. Unchanged asymmetric opacification throughout the left lung. Mild peripheral opacities within the right lower lung is also unchanged. No signs of pneumothorax. Trace left pleural fluid. Unchanged. IMPRESSION: 1. No change in the appearance of pneumomediastinum  compared with recent chest CT from 07/10/2021. 2. No change in aeration to the left lung. Electronically Signed   By: Kerby Moors M.D.   On: 07/11/2021 11:15   CT Angio Chest Pulmonary Embolism (PE) W or WO Contrast  Addendum Date: 07/10/2021   ADDENDUM REPORT: 07/10/2021 11:00 ADDENDUM: These results were called by telephone at the time of  interpretation on 07/10/2021 at 11:00 am to provider Dr. Tyrell Antonio, Who verbally acknowledged these results. Electronically Signed   By: Zetta Bills M.D.   On: 07/10/2021 11:00   Result Date: 07/10/2021 CLINICAL DATA:  71 year old male presents for evaluation of shortness of breath and suspected pneumomediastinum. Also with known DVT. EXAM: CT ANGIOGRAPHY CHEST WITH CONTRAST TECHNIQUE: Multidetector CT imaging of the chest was performed using the standard protocol during bolus administration of intravenous contrast. Multiplanar CT image reconstructions and MIPs were obtained to evaluate the vascular anatomy. RADIATION DOSE REDUCTION: This exam was performed according to the departmental dose-optimization program which includes automated exposure control, adjustment of the mA and/or kV according to patient size and/or use of iterative reconstruction technique. CONTRAST:  13m OMNIPAQUE IOHEXOL 350 MG/ML SOLN COMPARISON:  CT of the chest from Jun 20, 2021. Chest x-ray of May 21 and Jul 10, 2021. FINDINGS: Cardiovascular: Calcified and noncalcified aortic atherosclerotic plaque. No aneurysmal dilation. Normal heart size without pericardial effusion. Central pulmonary vasculature is opacified of 410 Hounsfield units, adequate for assessment and negative for pulmonary embolism to the segmental level. Subsegmental branches particularly at the lung bases with limited evaluation due to respiratory motion. Mediastinum/Nodes: Extensive pneumomediastinum tracking into the low neck and involving the LEFT greater than RIGHT mediastinal border and extending along the esophagus and tracking underneath the crus of the LEFT hemidiaphragm but not extending into the abdomen. Small associated pneumothorax at the LEFT lung apex and anterior LEFT chest. No signs of mediastinal shift. No adenopathy in the chest. Postoperative changes of prior CABG as before. Median sternotomy changes. Lungs/Pleura: Patchy areas of ground-glass and  consolidation that are worse on the LEFT have shown improvement since previous imaging particularly in the RIGHT chest but also in the LEFT chest. Pneumothorax appears mildly loculated. There is a small associated LEFT-sided effusion with resolution of RIGHT-sided pleural fluid. Upper Abdomen: Pneumomediastinum does not extend into the upper abdomen but tracks beneath the LEFT diaphragmatic crus. Post cholecystectomy. Imaged portions of liver, pancreas, spleen, adrenal glands and kidneys without acute process. No acute gastrointestinal findings. Musculoskeletal: Post sternotomy. No acute bone finding. No destructive bone process. Spinal degenerative changes. Review of the MIP images confirms the above findings. IMPRESSION: 1. Negative for pulmonary embolism to the segmental level. 2. Extensive pneumomediastinum tracking into the low neck and involving the LEFT greater than RIGHT mediastinal border and extending along the esophagus and underneath the crus of the LEFT hemidiaphragm but not extending into the abdomen. 3. Etiology for above process is uncertain. Perhaps cough leading to barotrauma. Esophageal source is another differential consideration. No mediastinal fluid or focal thickening of the esophagus. This does not allow for exclusion of esophageal source but is perhaps reassuring 4. Small associated and partially loculated LEFT-sided pneumothorax at the apex, anteriorly and with a small loculated component in the major fissure. 5. Small LEFT-sided pleural effusion slightly diminished with resolution of RIGHT-sided effusion. 6. Improving airspace disease still with considerable ground-glass and septal thickening in the LEFT chest and worse at the LEFT lung base. 7. Aortic atherosclerosis, coronary artery disease and changes of CABG. Aortic Atherosclerosis (ICD10-I70.0). Electronically  Signed: By: Zetta Bills M.D. On: 07/10/2021 10:52   DG CHEST PORT 1 VIEW  Result Date: 07/10/2021 CLINICAL DATA:   Dyspnea EXAM: PORTABLE CHEST 1 VIEW COMPARISON:  07/09/2021 chest radiograph. FINDINGS: Intact sternotomy wires. CABG clips overlie the mediastinum. Normal heart size. Previously questioned left-sided pneumomediastinum in the region of the aortic arch is less distinct on today's radiograph. No pneumothorax. No significant right pleural effusion. Small left pleural effusion peer is increased. Patchy hazy opacities throughout the left lung, slightly worsened. Mild reticular opacity at the right costophrenic angle is unchanged. IMPRESSION: 1. Small left pleural effusion, increased. 2. Patchy hazy opacities throughout the left lung, slightly worsened, compatible with pneumonia. 3. Previously questioned left-sided pneumomediastinum in the region of the aortic arch appears decreased. Electronically Signed   By: Ilona Sorrel M.D.   On: 07/10/2021 08:08   DG ESOPHAGUS W SINGLE CM (SOL OR THIN BA)  Result Date: 07/10/2021 CLINICAL DATA:  Pneumomediastinum.  Rule out esophageal perforation. EXAM: ESOPHOGRAM/BARIUM SWALLOW TECHNIQUE: Single contrast examination was performed using water-soluble contrast material. FLUOROSCOPY: Radiation Exposure Index (as provided by the fluoroscopic device): 6.30 mGy Kerma COMPARISON:  Chest CTA 07/10/2021 FINDINGS: A limited water-soluble esophagram was performed. The patient drank contrast without difficulty. The esophagus is normal in caliber, and contrast passed freely into the stomach. No contrast extravasation was observed to indicate an esophageal perforation. There is a small sliding hiatal hernia. IMPRESSION: No evidence of esophageal perforation. Electronically Signed   By: Logan Bores M.D.   On: 07/10/2021 13:55        Scheduled Meds:  brimonidine  1 drop Left Eye BID   carvedilol  3.125 mg Oral BID WC   chlorhexidine  15 mL Mouth Rinse BID   clopidogrel  75 mg Oral Daily   dapagliflozin propanediol  10 mg Oral Daily   dorzolamide-timolol  1 drop Both Eyes BID    enoxaparin (LOVENOX) injection  65 mg Subcutaneous Q12H   ezetimibe  10 mg Oral Daily   feeding supplement  237 mL Oral BID BM   guaiFENesin  600 mg Oral BID   latanoprost  1 drop Both Eyes QHS   pantoprazole (PROTONIX) IV  40 mg Intravenous Q12H   predniSONE  40 mg Oral Q breakfast   Followed by   Derrill Memo ON 07/16/2021] predniSONE  30 mg Oral Q breakfast   Followed by   Derrill Memo ON 07/21/2021] predniSONE  20 mg Oral Q breakfast   Followed by   Derrill Memo ON 07/26/2021] predniSONE  10 mg Oral Q breakfast   Followed by   Derrill Memo ON 07/31/2021] predniSONE  5 mg Oral Q breakfast   Followed by   Derrill Memo ON 08/05/2021] predniSONE  2.5 mg Oral Q breakfast   Continuous Infusions:  cefTRIAXone (ROCEPHIN)  IV 2 g (07/10/21 1356)   metronidazole 500 mg (07/11/21 0237)     LOS: 6 days    Time spent: 35 Minutes.     Elmarie Shiley, MD [facility]   If 7PM-7AM, please contact night-coverage [email or web address]  07/11/2021, 12:56 PM

## 2021-07-11 NOTE — Progress Notes (Addendum)
ANTICOAGULATION CONSULT NOTE - Follow Up Consult  Pharmacy Consult for IV heparin (changed from SQ LMWH) Indication: acute DVT  No Known Allergies  Patient Measurements: Height: '5\' 9"'$  (175.3 cm) Weight: 66.3 kg (146 lb 4.1 oz) IBW/kg (Calculated) : 70.7 Heparin Dosing Weight: total body weight   Vital Signs: Temp: 97.6 F (36.4 C) (05/22 2353) Temp Source: Oral (05/22 2353) BP: 99/62 (05/22 2353) Pulse Rate: 77 (05/22 2353)  Labs: Recent Labs    07/09/21 0330 07/09/21 1005 07/10/21 0837 07/10/21 1247 07/10/21 1953 07/10/21 2058 07/11/21 0347  HGB 8.9*   < >  --  9.8* 8.8*  --  9.1*  HCT 26.4*   < >  --  30.1* 27.1*  --  28.1*  PLT 421*   < >  --  556* 451*  --  456*  HEPARINUNFRC  --    < >  --  0.47  --  0.58 0.89*  CREATININE 0.55*  --  0.64  --   --   --  0.63   < > = values in this interval not displayed.     Estimated Creatinine Clearance: 80.6 mL/min (by C-G formula based on SCr of 0.63 mg/dL).   Assessment: 76 YOM recently hospitalized with respiratory failure from viral pneumonia presented to ED on 07/04/21 with dyspnea and hypoxia. Patient was found to have elevated d-dimer but was unable to have chest CTA on admission due to AKI. He was started on Enoxaparin for suspected PE on admission. Lower extremity doppler on 5/17 showed RLE DVT. Patient was noted to have blood in stool on 5/21; anticoagulation changed from SQ enoxaparin to IV heparin. SCr improved on 5/22, chest CTA completed and was negative for PE.    Today, 07/11/21: -heparin level = 0.89 units/mL (supratherapeutic)   -Hgb 9.2 > 9.8 > 8.8 > 9.1, Pltc elevated  -Per GI, rectal bleeding has improved and plan is to continue supportive care at this time -Of note, Cardiology resumed Plavix today  - RN reports patient a large BM during shift.  BM was reported to her by tech as black (similar to what was seen with previous BM)  Goal of Therapy:  Heparin level 0.3-0.7 units/ml Monitor platelets by  anticoagulation protocol: Yes   Plan:  -Decrease heparin infusion to 950 units/hr -Monitor CBC at least daily (currently ordered q8h per MD)  -Monitor daily heparin level -Monitor for further rectal bleeding     Leone Haven, PharmD 07/11/2021,4:40 AM

## 2021-07-11 NOTE — Progress Notes (Signed)
Progress Note  Patient Name: Roberto Ponce Date of Encounter: 07/11/2021  [facility] Cardiologist: Werner Lean, MD   Subjective   No complaints   Inpatient Medications    Scheduled Meds:  brimonidine  1 drop Left Eye BID   carvedilol  3.125 mg Oral BID WC   chlorhexidine  15 mL Mouth Rinse BID   clopidogrel  75 mg Oral Daily   dapagliflozin propanediol  10 mg Oral Daily   dorzolamide-timolol  1 drop Both Eyes BID   ezetimibe  10 mg Oral Daily   feeding supplement  237 mL Oral BID BM   guaiFENesin  600 mg Oral BID   latanoprost  1 drop Both Eyes QHS   pantoprazole (PROTONIX) IV  40 mg Intravenous Q12H   predniSONE  40 mg Oral Q breakfast   Followed by   Derrill Memo ON 07/16/2021] predniSONE  30 mg Oral Q breakfast   Followed by   Derrill Memo ON 07/21/2021] predniSONE  20 mg Oral Q breakfast   Followed by   Derrill Memo ON 07/26/2021] predniSONE  10 mg Oral Q breakfast   Followed by   Derrill Memo ON 07/31/2021] predniSONE  5 mg Oral Q breakfast   Followed by   Derrill Memo ON 08/05/2021] predniSONE  2.5 mg Oral Q breakfast   Continuous Infusions:  cefTRIAXone (ROCEPHIN)  IV 2 g (07/10/21 1356)   heparin Stopped (07/11/21 0920)   metronidazole 500 mg (07/11/21 0237)   PRN Meds: docusate sodium, ipratropium-albuterol, ondansetron (ZOFRAN) IV, polyethylene glycol   Vital Signs    Vitals:   07/10/21 1702 07/10/21 2118 07/10/21 2353 07/11/21 0545  BP: (!) 117/57 116/62 99/62 (!) 109/57  Pulse: 95 89 77 68  Resp: '17 20 18 20  '$ Temp: 97.8 F (36.6 C) 97.8 F (36.6 C) 97.6 F (36.4 C) 97.7 F (36.5 C)  TempSrc: Oral  Oral Oral  SpO2: 100% 100% 100% 97%  Weight:      Height:        Intake/Output Summary (Last 24 hours) at 07/11/2021 0948 Last data filed at 07/11/2021 0900 Gross per 24 hour  Intake 1405.46 ml  Output 925 ml  Net 480.46 ml      07/09/2021    5:00 AM 07/08/2021    5:00 AM 07/07/2021    5:00 AM  Last 3 Weights  Weight (lbs) 146 lb 4.1 oz 144 lb 1.6 oz 142  lb 1.6 oz  Weight (kg) 66.34 kg 65.363 kg 64.456 kg      Telemetry    NSR - Personally Reviewed  ECG    SR LAE old IMI no acute changes  - Personally Reviewed  Physical Exam   Affect appropriate Healthy:  appears stated age HEENT: normal Neck supple with no adenopathy JVP normal no bruits no thyromegaly Lungs clear with no wheezing and good diaphragmatic motion Heart:  S1/S2 Apical MR murmur, no rub, gallop or click PMI normal post sternotomy Abdomen: benighn, BS positve, no tenderness, no AAA no bruit.  No HSM or HJR post orchiectomy  Distal pulses intact with no bruits No edema Neuro non-focal Skin warm and dry No muscular weakness   Labs    High Sensitivity Troponin:   Recent Labs  Lab 06/20/21 1723 06/21/21 0500 07/04/21 2210 07/04/21 2350  TROPONINIHS 61* '16 8 10     '$ Chemistry Recent Labs  Lab 07/04/21 2210 07/06/21 0238 07/08/21 0931 07/09/21 0330 07/10/21 0837 07/11/21 0347  NA 123*   < >  --  130*  131* 131*  K 4.1   < >  --  4.4 4.0 4.6  CL 86*   < >  --  93* 95* 98  CO2 28   < >  --  '30 29 29  '$ GLUCOSE 172*   < >  --  121* 90 114*  BUN 18   < >  --  27* 21 21  CREATININE 1.25*   < >  --  0.55* 0.64 0.63  CALCIUM 8.2*   < >  --  8.4* 8.0* 8.3*  MG  --   --  2.3  --   --   --   PROT 7.9  --   --   --   --   --   ALBUMIN 2.6*  --   --   --   --   --   AST 47*  --   --   --   --   --   ALT 73*  --   --   --   --   --   ALKPHOS 106  --   --   --   --   --   BILITOT 0.9  --   --   --   --   --   GFRNONAA >60   < >  --  >60 >60 >60  ANIONGAP 9   < >  --  7 7 4*   < > = values in this interval not displayed.    Lipids No results for input(s): CHOL, TRIG, HDL, LABVLDL, LDLCALC, CHOLHDL in the last 168 hours.  Hematology Recent Labs  Lab 07/10/21 1247 07/10/21 1953 07/11/21 0347  WBC 20.6* 16.4* 16.9*  RBC 3.24* 2.91* 3.01*  HGB 9.8* 8.8* 9.1*  HCT 30.1* 27.1* 28.1*  MCV 92.9 93.1 93.4  MCH 30.2 30.2 30.2  MCHC 32.6 32.5 32.4  RDW  17.2* 17.2* 17.5*  PLT 556* 451* 456*   Thyroid No results for input(s): TSH, FREET4 in the last 168 hours.  BNP Recent Labs  Lab 07/04/21 2210  BNP 89.9    DDimer  Recent Labs  Lab 07/04/21 2210  DDIMER >20.00*     Radiology    DG Chest 2 View  Result Date: 07/09/2021 CLINICAL DATA:  Shortness of breath. EXAM: CHEST - 2 VIEW COMPARISON:  07/06/2021 and prior studies FINDINGS: Pneumomediastinum is now identified, primarily on the LEFT. Improved airspace opacities within the LEFT lung noted. No definite pneumothorax is noted. The cardiomediastinal silhouette is otherwise unchanged with evidence of median sternotomy. IMPRESSION: 1. Pneumomediastinum, primarily on the LEFT. No definite pneumothorax. 2. Improved LEFT lung airspace opacities. These results will be called to the ordering clinician or representative by the Radiologist Assistant, and communication documented in the PACS or [employer]. Electronically Signed   By: Margarette Canada M.D.   On: 07/09/2021 12:19   CT Angio Chest Pulmonary Embolism (PE) W or WO Contrast  Addendum Date: 07/10/2021   ADDENDUM REPORT: 07/10/2021 11:00 ADDENDUM: These results were called by telephone at the time of interpretation on 07/10/2021 at 11:00 am to provider Dr. Tyrell Antonio, Who verbally acknowledged these results. Electronically Signed   By: Zetta Bills M.D.   On: 07/10/2021 11:00   Result Date: 07/10/2021 CLINICAL DATA:  72 year old male presents for evaluation of shortness of breath and suspected pneumomediastinum. Also with known DVT. EXAM: CT ANGIOGRAPHY CHEST WITH CONTRAST TECHNIQUE: Multidetector CT imaging of the chest was performed using the standard protocol during bolus administration  of intravenous contrast. Multiplanar CT image reconstructions and MIPs were obtained to evaluate the vascular anatomy. RADIATION DOSE REDUCTION: This exam was performed according to the departmental dose-optimization program which includes automated exposure  control, adjustment of the mA and/or kV according to patient size and/or use of iterative reconstruction technique. CONTRAST:  28m OMNIPAQUE IOHEXOL 350 MG/ML SOLN COMPARISON:  CT of the chest from Jun 20, 2021. Chest x-ray of May 21 and Jul 10, 2021. FINDINGS: Cardiovascular: Calcified and noncalcified aortic atherosclerotic plaque. No aneurysmal dilation. Normal heart size without pericardial effusion. Central pulmonary vasculature is opacified of 410 Hounsfield units, adequate for assessment and negative for pulmonary embolism to the segmental level. Subsegmental branches particularly at the lung bases with limited evaluation due to respiratory motion. Mediastinum/Nodes: Extensive pneumomediastinum tracking into the low neck and involving the LEFT greater than RIGHT mediastinal border and extending along the esophagus and tracking underneath the crus of the LEFT hemidiaphragm but not extending into the abdomen. Small associated pneumothorax at the LEFT lung apex and anterior LEFT chest. No signs of mediastinal shift. No adenopathy in the chest. Postoperative changes of prior CABG as before. Median sternotomy changes. Lungs/Pleura: Patchy areas of ground-glass and consolidation that are worse on the LEFT have shown improvement since previous imaging particularly in the RIGHT chest but also in the LEFT chest. Pneumothorax appears mildly loculated. There is a small associated LEFT-sided effusion with resolution of RIGHT-sided pleural fluid. Upper Abdomen: Pneumomediastinum does not extend into the upper abdomen but tracks beneath the LEFT diaphragmatic crus. Post cholecystectomy. Imaged portions of liver, pancreas, spleen, adrenal glands and kidneys without acute process. No acute gastrointestinal findings. Musculoskeletal: Post sternotomy. No acute bone finding. No destructive bone process. Spinal degenerative changes. Review of the MIP images confirms the above findings. IMPRESSION: 1. Negative for pulmonary  embolism to the segmental level. 2. Extensive pneumomediastinum tracking into the low neck and involving the LEFT greater than RIGHT mediastinal border and extending along the esophagus and underneath the crus of the LEFT hemidiaphragm but not extending into the abdomen. 3. Etiology for above process is uncertain. Perhaps cough leading to barotrauma. Esophageal source is another differential consideration. No mediastinal fluid or focal thickening of the esophagus. This does not allow for exclusion of esophageal source but is perhaps reassuring 4. Small associated and partially loculated LEFT-sided pneumothorax at the apex, anteriorly and with a small loculated component in the major fissure. 5. Small LEFT-sided pleural effusion slightly diminished with resolution of RIGHT-sided effusion. 6. Improving airspace disease still with considerable ground-glass and septal thickening in the LEFT chest and worse at the LEFT lung base. 7. Aortic atherosclerosis, coronary artery disease and changes of CABG. Aortic Atherosclerosis (ICD10-I70.0). Electronically Signed: By: GZetta BillsM.D. On: 07/10/2021 10:52   DG CHEST PORT 1 VIEW  Result Date: 07/10/2021 CLINICAL DATA:  Dyspnea EXAM: PORTABLE CHEST 1 VIEW COMPARISON:  07/09/2021 chest radiograph. FINDINGS: Intact sternotomy wires. CABG clips overlie the mediastinum. Normal heart size. Previously questioned left-sided pneumomediastinum in the region of the aortic arch is less distinct on today's radiograph. No pneumothorax. No significant right pleural effusion. Small left pleural effusion peer is increased. Patchy hazy opacities throughout the left lung, slightly worsened. Mild reticular opacity at the right costophrenic angle is unchanged. IMPRESSION: 1. Small left pleural effusion, increased. 2. Patchy hazy opacities throughout the left lung, slightly worsened, compatible with pneumonia. 3. Previously questioned left-sided pneumomediastinum in the region of the aortic  arch appears decreased. Electronically Signed   By: JRinaldo Ratel  Poff M.D.   On: 07/10/2021 08:08   DG ESOPHAGUS W SINGLE CM (SOL OR THIN BA)  Result Date: 07/10/2021 CLINICAL DATA:  Pneumomediastinum.  Rule out esophageal perforation. EXAM: ESOPHOGRAM/BARIUM SWALLOW TECHNIQUE: Single contrast examination was performed using water-soluble contrast material. FLUOROSCOPY: Radiation Exposure Index (as provided by the fluoroscopic device): 6.30 mGy Kerma COMPARISON:  Chest CTA 07/10/2021 FINDINGS: A limited water-soluble esophagram was performed. The patient drank contrast without difficulty. The esophagus is normal in caliber, and contrast passed freely into the stomach. No contrast extravasation was observed to indicate an esophageal perforation. There is a small sliding hiatal hernia. IMPRESSION: No evidence of esophageal perforation. Electronically Signed   By: Logan Bores M.D.   On: 07/10/2021 13:55    Cardiac Studies   Echo from 07/05/21:    1. Left ventricular ejection fraction, by estimation, is 40 to 45%. The  left ventricle has mildly decreased function. The left ventricle  demonstrates regional wall motion abnormalities (see scoring  diagram/findings for description). There is mild  concentric left ventricular hypertrophy. Left ventricular diastolic  parameters are indeterminate. There is akinesis of the left ventricular,  basal-mid inferior wall. There is hypokinesis of the left ventricular,  mid-apical inferolateral wall and inferior   wall.   2. Right ventricular systolic function is normal. The right ventricular  size is normal. There is mildly elevated pulmonary artery systolic  pressure.   3. The mitral valve is abnormal. Mild to moderate mitral valve  regurgitation.   4. Tricuspid valve regurgitation is mild to moderate.   5. The aortic valve is normal in structure. Aortic valve regurgitation is  not visualized. No aortic stenosis is present.   Comparison(s): No significant  change from prior study. Prior images  reviewed side by side.    Echo from 06/20/21:   1. Left ventricular ejection fraction, by estimation, is 40 to 45%. The  left ventricle has mildly decreased function. The left ventricle  demonstrates regional wall motion abnormalities (see scoring  diagram/findings for description). There is mild left  ventricular hypertrophy. Left ventricular diastolic parameters are  consistent with Grade II diastolic dysfunction (pseudonormalization).  Elevated left atrial pressure.   2. Right ventricular systolic function is mildly reduced. The right  ventricular size is mildly enlarged. There is moderately elevated  pulmonary artery systolic pressure. The estimated right ventricular  systolic pressure is 16.1 mmHg.   3. Tricuspid valve regurgitation is mild to moderate.   4. The aortic valve is tricuspid. Aortic valve regurgitation is not  visualized. Aortic valve sclerosis is present, with no evidence of aortic  valve stenosis.   5. The inferior vena cava is dilated in size with <50% respiratory  variability, suggesting right atrial pressure of 15 mmHg.   6. The mitral valve is abnormal. Moderate mitral valve regurgitation.  Eccentric posterior directed MR jet. Difficult to quantify severity given  eccentric jet, but normal LA/LV size argues against severe MR. Consider  TEE or cardiac MRI for further  evaluation    Right and left heart cath from 02/14/21:   1.  Severe 3-vessel coronary artery disease with total occlusion of the LAD and total occlusion of the RCA, and severe stenosis of the native left circumflex 2.  Status post CABG with continued patency of the LIMA to LAD and RIMA to RCA 3.  Successful PCI of severe 95% stenosis of the mid circumflex, treated with a 3.0 x 15 mm Onyx frontier DES 4.  Severe mitral regurgitation by echo assessment, hemodynamics demonstrate  low wedge pressure, no V wave, and no pulmonary hypertension.  Recommendations: DAPT  with aspirin and clopidogrel x6 months, repeat echocardiogram in 6 months, defer transcatheter edge-to-edge mitral valve repair at present to reevaluate after PCI.      Patient Profile     Roberto Ponce is a 71 y.o. male with a hx of CAD with hx of CABG in 1988 ( LIMA to LAD and RIMA to RCA) and PCI to mid Cx 01/2021, moderate to severe MR, chronic systolic and diastolic heart failure, testicular cancer status post orchiectomy,  HLD, glaucoma, presumed SIADH with hyponatremia (during hospitalization 06/18/21),  cardiology is following since 07/06/2021 for the evaluation of CHF at the request of Dr Halford Chessman.  Assessment & Plan    Acute hypoxic respiratory failure  - ? DVT RLE hypercoagulable with testicular cancer vs other BOOP ? Pneumothorax and pneumomediastinum from coughing Barium swallow with no esophageal perforation Follow CXR for pneumo make sure no chest tube needed per CCM  Chronic systolic and diastolic heart failure  - Echo repeat 07/05/21 LVEF 40-45%, no acute change of MR   - clinically euvolemic today  - GDMT: on PTA Coreg 3.'125mg'$  BID, spironolactone 12.'5mg'$ , farxiga '10MG'$  (not on losartan due to hx of hypotension), spironolactone held currently resume prior to d/c    Moderate to severe MR  - symptoms stable - follow up with Dr Burt Knack once discharged/recovered from acute illness  - given co morbidities and previous CABG not a candidate for repeat open heart surgery would need to consider clip   CAD with hx of CABG 1988 and PCI to mid Cx 02/14/2021 - Hs trop negative x2 - EKG no acute ischemic findings - no angina symptoms  - PTA medical therapy with ASA +Plavix (6 month), Coreg, Zetia, (ARB dopped due to hypotension last admission, statin was held due to unclear etiology of transaminitis last admission) - given therapeutic anticoagulation need with acute DVT, ASA was stopped and recommended continue Plavix+ Anticoagulation going forward for CAD per Dr Johney Frame; however patient  is now with possible GI bleed with dark stool, Plavix has been stopped and Lovenox transitioned to heparin gtt per primary team, GI consult from 5/21 noted, felt bleeding is scant, recommend to hold Plavix and continue heparin gtt while monitor GI bleed clinically for now, no scope planned thus far  - would resume Plavix as soon as able    Acute DVT of right popliteal /posterior tibial Letitia Caul /gastrocnemius veins  - noted on doppler 07/05/21 - suspicion for PE is strong, Echo 5/17  without right heart strain  - on heparin gtt now, managed per medicine    Hyponatremia - was felt due to SIADH in the setting of pneumonia last admission, Na was 130 at discharge on 06/28/21 and 123 on 07/04/21 this admission, improved to 130 after reducing Lasix this admission, diuretics held currently due to GIB and pneumomediastinum with pending workup   Testicular cancer - under surveillance, pending CT planned per urology   Suspected GI bleed Anemia  Pneumomediastinum  Staph epidermidis in 1/2 blood cultures 07/05/21  Leukocytosis  - managed per medicine    For questions or updates, please contact [place] Please consult [email or web address] for contact info under        Signed, Jenkins Rouge, MD  07/11/2021, 9:48 AM    Patient examined chart reviewed Discussed care with primary pulmonary and patients wife. Exam with rhonchi diffuse parenchymal sounds Apical MR murmur euvolemic post orchiectomy  Have ordered CTA r/o  PE will resume plavix post stenting December 2022 and stable Hct   Jenkins Rouge MD [facility]

## 2021-07-11 NOTE — Progress Notes (Signed)
NAME:  Roberto Ponce, MRN:  [id number], DOB:  01-25-1951, LOS: 6 ADMISSION DATE:  07/04/2021, CONSULTATION DATE:  07/04/2021 REFERRING MD:  Dr. 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  In evening of 07/04/21 he developed sudden onset of dyspnea associated with hypoxia with SpO2 in low 80s on room air.  He presented to ER and required Bipap. Work up consistent with acute RLE DVT.  Pertinent  Medical History  CAD, MVP with mod/severe MR, Testicular cancer s/p orchiectomy, Glaucoma, HLD  Significant Hospital Events:   5/16 Admit 5/18 Didn't need Bipap overnight.  Breathing better.  5/21 pneumomediastinum noted on chest radiograph, concern for GI bleeding, switched to heparin from lovenox 5/22 CTA Chest negative for PE. Pneumomediastinum and small left pneumothorax noted  Tests:  Echo 5/17 >> EF 40 to 45%, mild LVH, mild elevation in PASP, mild/mod MR, mild/mod TR Doppler legs b/l 5/17 >> acute DVT Rt popliteal, posterior tibial, peroneal and gastrocnemius veins  Interim History / Subjective:   No acute events overnight He continues to feel ok No further bloody bowel movements   Objective   Blood pressure (!) 109/57, pulse 68, temperature 97.7 F (36.5 C), temperature source Oral, resp. rate 20, height '5\' 9"'$  (1.753 m), weight 66.3 kg, SpO2 97 %.        Intake/Output Summary (Last 24 hours) at 07/11/2021 1044 Last data filed at 07/11/2021 0900 Gross per 24 hour  Intake 1405.46 ml  Output 925 ml  Net 480.46 ml   Filed Weights   07/07/21 0500 07/08/21 0500 07/09/21 0500  Weight: 64.5 kg 65.4 kg 66.3 kg    Examination: General: adult male sitting up in chair in NAD  HEENT: MM pink/moist, anicteric, [place] O2  Neuro: Awake, alert, speech clear, MAE. CV: s1s2 RRR, no  m/r/g PULM: non-labored at rest, crackles throughout left posteriorly, no wheezing GI: soft, bsx4 active  Extremities: warm/dry, RLE >LLE (known DVT)   Skin: no rashes or lesions  Assessment & Plan:   Acute hypoxic respiratory failure. Pneumomediastinum Suspect combination of acute pulmonary edema and recurrence of post-viral BOOP and possible PE. He has small left pneumothorax and pneumomediastinum  - Continue prednisone taper with '40mg'$  daily and dropping down by '10mg'$  every 5 days over the next month -wean O2 for sats >90% -No PE noted on CTA Chest 5/22 - Barium esophogram without apparent leak - Pneumomediastinum likely a result from the recent viral pneumonia with damage to the lungs and rupture of small bleb - Will continue to monitor with chest radiographs  Acute Rt leg DVT in setting of testicular cancer and recent hospitalization with decreased mobility. - transition back to lovenox  Concern for GI Bleeding - patient with bloody stools reported 5/21 - No further drop in H/H and no further bloody Bms noted - continue to monitor H/H   Coronary artery disease s/p CABG, mod/severe MR, Acute on chronic systolic CHF, HLD. Followed by Dr.  Rudean Haskell -appreciate Cardiology assistance with patient care  -defer to Cards re: if he should remain on ASA + Plavix -continue coreg, plavix, farxiga, zetia, lasix, spironolactone   Staph epidermidis in blood culture. 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 -fluid restriction  -follow sodium trend   Glaucoma. -continue eye drops  Hx of testicular cancer. Followed by Dr. Zola Button with oncology and Dr. Link Snuffer with [facility] -patient is scheduled for surveillance CT imaging the first week of June  Best Practice (right click and "Reselect all SmartList Selections" daily)  Diet/type: Regular consistency (see orders) DVT prophylaxis: systemic heparin GI  prophylaxis: N/A Lines: N/A Foley:  N/A Code Status:  full code Last date of multidisciplinary goals of care discussion: updated wife at bedside 5/19 on plan of care  Labs       Latest Ref Rng & Units 07/11/2021    3:47 AM 07/10/2021    8:37 AM 07/09/2021    3:30 AM  CMP  Glucose 70 - 99 mg/dL 114   90   121    BUN 8 - 23 mg/dL '21   21   27    '$ Creatinine 0.61 - 1.24 mg/dL 0.63   0.64   0.55    Sodium 135 - 145 mmol/L 131   131   130    Potassium 3.5 - 5.1 mmol/L 4.6   4.0   4.4    Chloride 98 - 111 mmol/L 98   95   93    CO2 22 - 32 mmol/L '29   29   30    '$ Calcium 8.9 - 10.3 mg/dL 8.3   8.0   8.4         Latest Ref Rng & Units 07/11/2021    3:47 AM 07/10/2021    7:53 PM 07/10/2021   12:47 PM  CBC  WBC 4.0 - 10.5 K/uL 16.9   16.4   20.6    Hemoglobin 13.0 - 17.0 g/dL 9.1   8.8   9.8    Hematocrit 39.0 - 52.0 % 28.1   27.1   30.1    Platelets 150 - 400 K/uL 456   451   556      ABG    Component Value Date/Time   PHART 7.46 (H) 07/04/2021 2220   PCO2ART 38 07/04/2021 2220   PO2ART 71 (L) 07/04/2021 2220   HCO3 27.0 07/04/2021 2220   TCO2 28 02/14/2021 1507   TCO2 27 02/14/2021 1507   ACIDBASEDEF 0.1 06/20/2021 0200   O2SAT 97 07/04/2021 2220    Signature:    Freda Jackson, MD [place] Office: [phone]   See Amion for personal pager PCCM on call pager [phone] until 7pm. Please call Elink 7p-7a. [phone]

## 2021-07-12 DIAGNOSIS — Z8679 Personal history of other diseases of the circulatory system: Secondary | ICD-10-CM

## 2021-07-12 DIAGNOSIS — I5022 Chronic systolic (congestive) heart failure: Secondary | ICD-10-CM

## 2021-07-12 DIAGNOSIS — I2510 Atherosclerotic heart disease of native coronary artery without angina pectoris: Secondary | ICD-10-CM | POA: Diagnosis not present

## 2021-07-12 DIAGNOSIS — I38 Endocarditis, valve unspecified: Secondary | ICD-10-CM

## 2021-07-12 DIAGNOSIS — E871 Hypo-osmolality and hyponatremia: Secondary | ICD-10-CM

## 2021-07-12 DIAGNOSIS — K921 Melena: Secondary | ICD-10-CM

## 2021-07-12 DIAGNOSIS — Z8639 Personal history of other endocrine, nutritional and metabolic disease: Secondary | ICD-10-CM | POA: Diagnosis not present

## 2021-07-12 DIAGNOSIS — Z8547 Personal history of malignant neoplasm of testis: Secondary | ICD-10-CM

## 2021-07-12 DIAGNOSIS — I82401 Acute embolism and thrombosis of unspecified deep veins of right lower extremity: Secondary | ICD-10-CM

## 2021-07-12 DIAGNOSIS — J9601 Acute respiratory failure with hypoxia: Secondary | ICD-10-CM | POA: Diagnosis not present

## 2021-07-12 DIAGNOSIS — R748 Abnormal levels of other serum enzymes: Secondary | ICD-10-CM

## 2021-07-12 DIAGNOSIS — J8489 Other specified interstitial pulmonary diseases: Secondary | ICD-10-CM

## 2021-07-12 DIAGNOSIS — D638 Anemia in other chronic diseases classified elsewhere: Secondary | ICD-10-CM

## 2021-07-12 DIAGNOSIS — J9621 Acute and chronic respiratory failure with hypoxia: Secondary | ICD-10-CM

## 2021-07-12 DIAGNOSIS — N179 Acute kidney failure, unspecified: Secondary | ICD-10-CM

## 2021-07-12 LAB — CBC
HCT: 29.3 % — ABNORMAL LOW (ref 39.0–52.0)
Hemoglobin: 9.3 g/dL — ABNORMAL LOW (ref 13.0–17.0)
MCH: 29.6 pg (ref 26.0–34.0)
MCHC: 31.7 g/dL (ref 30.0–36.0)
MCV: 93.3 fL (ref 80.0–100.0)
Platelets: 471 10*3/uL — ABNORMAL HIGH (ref 150–400)
RBC: 3.14 MIL/uL — ABNORMAL LOW (ref 4.22–5.81)
RDW: 17.8 % — ABNORMAL HIGH (ref 11.5–15.5)
WBC: 18.1 10*3/uL — ABNORMAL HIGH (ref 4.0–10.5)
nRBC: 0.1 % (ref 0.0–0.2)

## 2021-07-12 MED ORDER — ENSURE ENLIVE PO LIQD: 237.0000 mL | Freq: Two times a day (BID) | ORAL | 12 refills | Status: DC

## 2021-07-12 MED ORDER — POLYETHYLENE GLYCOL 3350 17 GM/SCOOP PO POWD: 17.0000 g | Freq: Two times a day (BID) | ORAL | 0 refills | Status: DC | PRN

## 2021-07-12 MED ORDER — PANTOPRAZOLE SODIUM 40 MG PO TBEC: 40.0000 mg | DELAYED_RELEASE_TABLET | Freq: Two times a day (BID) | ORAL | 1 refills | Status: DC

## 2021-07-12 MED ORDER — APIXABAN 5 MG PO TABS: 5.0000 mg | ORAL_TABLET | Freq: Two times a day (BID) | ORAL | 2 refills | Status: DC

## 2021-07-12 MED ORDER — GUAIFENESIN ER 600 MG PO TB12
600.0000 mg | ORAL_TABLET | Freq: Two times a day (BID) | ORAL | 0 refills | Status: AC
Start: 2021-07-12 — End: 2021-07-17

## 2021-07-12 MED ORDER — PREDNISONE 10 MG PO TABS: ORAL_TABLET | ORAL | 0 refills | Status: DC

## 2021-07-12 MED ORDER — FUROSEMIDE 40 MG PO TABS: 40.0000 mg | ORAL_TABLET | Freq: Every day | ORAL | 3 refills | Status: DC

## 2021-07-12 NOTE — Progress Notes (Signed)
SATURATION QUALIFICATIONS: (This note is used to comply with regulatory documentation for home oxygen)  Patient Saturations on Room Air at Rest = 96%  Patient Saturations on Room Air while Ambulating = 87%  Patient Saturations on 2 Liters of oxygen while Ambulating = 89%  Please briefly explain why patient needs home oxygen: Patient desaturates when ambulating.  Angie Fava, RN

## 2021-07-12 NOTE — Progress Notes (Signed)
Patient had large Type 6 brown bowel movement.  Angie Fava, RN

## 2021-07-12 NOTE — Plan of Care (Signed)

## 2021-07-12 NOTE — Discharge Summary (Signed)
Physician Discharge Summary  Roberto Ponce WYO:[id number] DOB: [birth year] DOA: 07/04/2021  PCP: Kristen Loader, FNP  Admit date: 07/04/2021 Discharge date: 07/12/2021 Admitted From: Home Disposition: Home Recommendations for Outpatient Follow-up:  Follow ups as below. Please obtain CBC/CMP/Mag at follow up Please follow up on the following pending results: None  Home Health: PT/OT Equipment/Devices: Home oxygen  Discharge Condition: Stable CODE STATUS: Full code  Follow-up Information     Kristen Loader, FNP. Schedule an appointment as soon as possible for a visit in 1 week(s).   Specialty: Family Medicine Contact information: [place] [address] [phone]         Werner Lean, MD .   Specialty: Cardiology Contact information: [place] [address] [phone]         [facility]. Schedule an appointment as soon as possible for a visit in 2 week(s).   Specialty: Pulmonology Contact information: [place] [phone] [facility] 71 year old M with PMH of CAD/CABG and recent stent in 01/2021, mitral valve prolapse, moderate to severe MR, systolic CHF, testicular cancer s/p orchiectomy, recent viral pneumonia complicated by BOOP and hypoxic respiratory failure on 4 L presenting with shortness of breath and hypoxemia to 80% on RA, and admitted for acute respiratory failure with hypoxia in the setting of recurrent postviral BOOP and acute distal RLE DVT.  Patient was started on steroid for recurrent BOOP and anticoagulation for RLE DVT.  CTA chest did not show PE but a small left-sided pneumothorax and moderate pneumomediastinum.  Pulmonology and GI consulted.  Esophagram did not show esophageal perforation.  Pneumomediastinum remained stable on subsequent x-rays.  Hypoxemia improved.  Pulmonology cleared patient for  discharge on steroid taper and PPI. There was concern about GI bleed on 5/21.  However, H&H remained stable.  Cardiology consulted given recent coronary stent and DAPT.  Cardiology recommended discontinuing aspirin and discharging patient on Plavix and Eliquis.  Therapy recommended home health and DME.  See individual problem list below for more on hospital course.  Problems addressed during this hospitalization Acute on chronic respiratory failure with hypoxia-due to recurrent postviral BOOP?  Improved.  Discharged on steroid taper, PPI and 2 L by nasal cannula. -Outpatient follow-up with pulmonology  Acute distal RLE DVT-in the setting of testicular cancer and recent hospitalization with decreased mobility.  CTA chest negative for PE.  Treated with heparin>>Lovenox.  Discharged on Eliquis.  Pneumomediastinum: Likely due to pulmonary leak from BOOP.  Also cardiogram negative for esophageal perforation.  Remained stable on subsequent chest x-rays.  Cleared for discharge by pulmonology -Outpatient follow-up with pulmonology  CAD s/p CABG and recent stent in 01/2021: Stable. -Aspirin discontinued per recommendation by cardiology -Discharged on Plavix and Eliquis -Cardiac meds resumed on discharge per cardiology recommendation  Chronic systolic CHF: Stable and euvolemic. -Advised to hold Lasix for 1 week and resume at half dose -Resumed other cardiac meds on discharge per cardiology recommendation -Outpatient follow-up with cardiology  Valvular disease: MVP/moderate to severe MVR -Outpatient follow-up with cardiology  AKI: Resolved.  Elevated liver enzymes -Recheck CMP at follow-up.  Anemia of chronic disease/melena: H&H stable. -P.o. Protonix 40 mg twice daily while on steroid -Repeat H&H outpatient  Positive blood culture: Staph epidermidis in 2 out of 4 bottles.  Likely contaminant  Hyponatremia: Could be due to  Aldactone, AKI or SIADH.  Improved. -Recheck BMP at  follow-up  History of testicular cancer -Outpatient follow-up with oncology  Physical deconditioning in the setting of acute on chronic illness -PT/OT  Bandemia: Likely demargination from steroid. -Recheck CBC once off steroid  Vital signs Vitals:   07/11/21 2105 07/12/21 0451 07/12/21 1154 07/12/21 1243  BP: (!) 114/56 115/72 (!) 132/54 (!) 118/52  Pulse: 83 87 81 77  Temp: (!) 97.5 F (36.4 C) (!) 97.5 F (36.4 C) 98 F (36.7 C) (!) 97.5 F (36.4 C)  Resp: 20 20 (!) 28 20  Height:      Weight:      SpO2: 99% 97% 98% 100%  TempSrc: Oral Oral Oral Oral  BMI (Calculated):         Discharge exam  GENERAL: No apparent distress.  Nontoxic. HEENT: MMM.  Vision and hearing grossly intact.  NECK: Supple.  No apparent JVD.  RESP:  No IWOB.  Fair aeration bilaterally. CVS:  RRR. Heart sounds normal.  ABD/GI/GU: BS+. Abd soft, NTND.  MSK/EXT:  Moves extremities. No apparent deformity.  Trace LLE edema.  1+ RLE edema. SKIN: no apparent skin lesion or wound NEURO: Awake and alert. Oriented appropriately.  No apparent focal neuro deficit. PSYCH: Calm. Normal affect.   Discharge Instructions Discharge Instructions     Call MD for:  difficulty breathing, headache or visual disturbances   Complete by: As directed    Call MD for:  extreme fatigue   Complete by: As directed    Call MD for:  persistant dizziness or light-headedness   Complete by: As directed    Call MD for:  severe uncontrolled pain   Complete by: As directed    Diet - low sodium heart healthy   Complete by: As directed    Discharge instructions   Complete by: As directed    It has been a pleasure taking care of you!  You were hospitalized due to shortness of breath and low oxygen.  You have been diagnosed with right leg DVT (blood clot) and pneumomediastinum (free air in your chest).  You have been treated with blood thinner for blood clot and a steroid for breathing issues.  Your symptoms improved to the  point we think it is safe to let you go home and follow-up with your doctors.  We are discharging you on Eliquis for blood clot.  We are discharging you on more steroid for breathing issues.  We have stopped your aspirin.  Please review your new medication list and the directions on your medications before you take them.  Follow-up with your primary care doctor, pulmonologist and cardiologist in 1 to 2 weeks or sooner if needed.   Take care,   Increase activity slowly   Complete by: As directed       Allergies as of 07/12/2021   No Known Allergies      Medication List     STOP taking these medications    aspirin EC 81 MG tablet       TAKE these medications    acetaminophen 500 MG tablet Commonly known as: TYLENOL Take 1,000 mg by mouth every 6 (six) hours as needed for moderate pain.   apixaban 5 MG Tabs tablet Commonly known as: ELIQUIS Take 1 tablet (5 mg total) by mouth 2 (two) times daily.   brimonidine 0.2 % ophthalmic solution Commonly known as: ALPHAGAN Place 1 drop into the left eye 2 (two) times daily.   carvedilol 3.125  MG tablet Commonly known as: COREG Take 1 tablet (3.125 mg total) by mouth 2 (two) times daily with a meal.   clopidogrel 75 MG tablet Commonly known as: PLAVIX Take 1 tablet (75 mg total) by mouth daily with breakfast.   dapagliflozin propanediol 10 MG Tabs tablet Commonly known as: FARXIGA Take 1 tablet (10 mg total) by mouth daily.   dorzolamide-timolol 22.3-6.8 MG/ML ophthalmic solution Commonly known as: COSOPT Place 1 drop into both eyes 2 (two) times daily.   ezetimibe 10 MG tablet Commonly known as: ZETIA TAKE 1 TABLET(10 MG) BY MOUTH DAILY What changed:  how much to take how to take this when to take this additional instructions   feeding supplement Liqd Take 237 mLs by mouth 2 (two) times daily between meals.   furosemide 40 MG tablet Commonly known as: Lasix Take 1 tablet (40 mg total) by mouth daily. Start  taking on: August 02, 2021 What changed:  when to take this These instructions start on August 02, 2021. If you are unsure what to do until then, ask your doctor or other care provider.   guaiFENesin 600 MG 12 hr tablet Commonly known as: MUCINEX Take 1 tablet (600 mg total) by mouth 2 (two) times daily for 5 days.   latanoprost 0.005 % ophthalmic solution Commonly known as: XALATAN Place 1 drop into both eyes at bedtime.   nitroGLYCERIN 0.4 MG SL tablet Commonly known as: Nitrostat Place 1 tablet (0.4 mg total) under the tongue every 5 (five) minutes as needed for chest pain.   pantoprazole 40 MG tablet Commonly known as: Protonix Take 1 tablet (40 mg total) by mouth 2 (two) times daily.   polyethylene glycol powder 17 GM/SCOOP powder Commonly known as: MiraLax Take 17 g by mouth 2 (two) times daily as needed for moderate constipation.   predniSONE 10 MG tablet Commonly known as: DELTASONE Take 4 tablets (40 mg total) by mouth daily with breakfast for 2 days, THEN 3 tablets (30 mg total) daily with breakfast for 3 days, THEN 2 tablets (20 mg total) daily with breakfast for 3 days, THEN 1 tablet (10 mg total) daily with breakfast for 3 days, THEN 0.5 tablets (5 mg total) daily with breakfast for 3 days. Start taking on: Jul 13, 2021   Sodium Fluoride 5000 PPM 1.1 % Pste Generic drug: Sodium Fluoride Apply 1 application. topically in the morning and at bedtime.   spironolactone 25 MG tablet Commonly known as: ALDACTONE Take 0.5 tablets (12.5 mg total) by mouth daily.   triamcinolone cream 0.1 % Commonly known as: KENALOG Apply 1 application topically 2 (two) times daily as needed (eczema).        Consultations: Pulmonology Gastroenterology Cardiology  Procedures/Studies:   DG Chest 2 View  Result Date: 07/11/2021 CLINICAL DATA:  Follow-up.  Pneumo mediastinum. EXAM: CHEST - 2 VIEW COMPARISON:  07/10/2021. FINDINGS: Stable cardiomediastinal contours.  Pneumomediastinum is again noted bilaterally. Gas is noted tracking into the lower neck as before. Unchanged asymmetric opacification throughout the left lung. Mild peripheral opacities within the right lower lung is also unchanged. No signs of pneumothorax. Trace left pleural fluid. Unchanged. IMPRESSION: 1. No change in the appearance of pneumomediastinum compared with recent chest CT from 07/10/2021. 2. No change in aeration to the left lung. Electronically Signed   By: Kerby Moors M.D.   On: 07/11/2021 11:15   DG Chest 2 View  Result Date: 07/09/2021 CLINICAL DATA:  Shortness of breath. EXAM: CHEST - 2 VIEW COMPARISON:  07/06/2021 and prior studies FINDINGS: Pneumomediastinum is now identified, primarily on the LEFT. Improved airspace opacities within the LEFT lung noted. No definite pneumothorax is noted. The cardiomediastinal silhouette is otherwise unchanged with evidence of median sternotomy. IMPRESSION: 1. Pneumomediastinum, primarily on the LEFT. No definite pneumothorax. 2. Improved LEFT lung airspace opacities. These results will be called to the ordering clinician or representative by the Radiologist Assistant, and communication documented in the PACS or [employer]. Electronically Signed   By: Margarette Canada M.D.   On: 07/09/2021 12:19   DG Chest 2 View  Result Date: 06/26/2021 CLINICAL DATA:  See HF with cough. EXAM: CHEST - 2 VIEW COMPARISON:  06/23/2021 FINDINGS: Mildly improved bilateral asymmetric airspace disease, more diffuse on the left than the right. No substantial pleural effusion. The cardiopericardial silhouette is within normal limits for size. Right PICC line tip overlies the mid SVC level. Telemetry leads overlie the chest. IMPRESSION: Persistent but improved bilateral asymmetric airspace disease, left greater than right. Electronically Signed   By: Misty Stanley M.D.   On: 06/26/2021 09:58   DG Chest 2 View  Result Date: 06/18/2021 CLINICAL DATA:  Shortness of breath.   Fever, chills, cough. EXAM: CHEST - 2 VIEW COMPARISON:  Chest CTA 03/16/2021 FINDINGS: Post median sternotomy and CABG. The heart is normal in size. There is peripheral subpleural reticulation the basilar predominant distribution that is similar to prior CT. Basilar honeycombing. Patchy left lower lobe opacity. No pleural effusion or pneumothorax. No acute osseous findings. IMPRESSION: 1. Patchy left lower lobe opacity suspicious for pneumonia in the setting of cough and fever. 2. Background chronic interstitial lung disease, unchanged from prior CT. Electronically Signed   By: Keith Rake M.D.   On: 06/18/2021 15:41   CT CHEST WO CONTRAST  Result Date: 06/20/2021 CLINICAL DATA:  Pneumonia, complication suspected. History coronary artery disease post MI and CABG. Recent diagnosis of testicular seminoma post orchiectomy. Presents with cough and shortness of breath, admitted for community-acquired pneumonia EXAM: CT CHEST WITHOUT CONTRAST TECHNIQUE: Multidetector CT imaging of the chest was performed following the standard protocol without IV contrast. RADIATION DOSE REDUCTION: This exam was performed according to the departmental dose-optimization program which includes automated exposure control, adjustment of the mA and/or kV according to patient size and/or use of iterative reconstruction technique. COMPARISON:  03/16/2021 FINDINGS: Cardiovascular: Atherosclerotic calcifications aorta, coronary arteries, proximal great vessels. Postsurgical changes of CABG. Enlargement of cardiac chambers. No pericardial effusion. Aorta normal caliber. Mediastinum/Nodes: Esophagus unremarkable. Base of cervical region normal appearance. Enlarged mediastinal lymph nodes new since previous exam including 14 mm RIGHT paratracheal node image 42, 13 mm subcarinal node image 62, 12 mm node adjacent to RIGHT mainstem bronchus image 55. Lungs/Pleura: Small BILATERAL pleural effusions. Calcified granuloma RIGHT lower lobe.  Extensive airspace consolidation bilaterally greater on LEFT. Central peribronchial thickening. No mass or pneumothorax. Upper Abdomen: Visualized upper abdomen unremarkable Musculoskeletal: No osseous abnormalities. IMPRESSION: Extensive BILATERAL airspace consolidation greater on LEFT, consistent with pneumonia. Small BILATERAL pleural effusions. Nonspecific mediastinal adenopathy, likely reactive in the setting of extensive infiltrates but follow-up until resolution recommended in light of history of testicular cancer. Extensive atherosclerotic disease changes including coronary arteries with postsurgical changes of CABG. Aortic Atherosclerosis (ICD10-I70.0). Electronically Signed   By: Lavonia Dana M.D.   On: 06/20/2021 13:39   CT Angio Chest Pulmonary Embolism (PE) W or WO Contrast  Addendum Date: 07/10/2021   ADDENDUM REPORT: 07/10/2021 11:00 ADDENDUM: These results were called by telephone at the time of interpretation  on 07/10/2021 at 11:00 am to provider Dr. Tyrell Antonio, Who verbally acknowledged these results. Electronically Signed   By: Zetta Bills M.D.   On: 07/10/2021 11:00   Result Date: 07/10/2021 CLINICAL DATA:  71 year old male presents for evaluation of shortness of breath and suspected pneumomediastinum. Also with known DVT. EXAM: CT ANGIOGRAPHY CHEST WITH CONTRAST TECHNIQUE: Multidetector CT imaging of the chest was performed using the standard protocol during bolus administration of intravenous contrast. Multiplanar CT image reconstructions and MIPs were obtained to evaluate the vascular anatomy. RADIATION DOSE REDUCTION: This exam was performed according to the departmental dose-optimization program which includes automated exposure control, adjustment of the mA and/or kV according to patient size and/or use of iterative reconstruction technique. CONTRAST:  59m OMNIPAQUE IOHEXOL 350 MG/ML SOLN COMPARISON:  CT of the chest from Jun 20, 2021. Chest x-ray of May 21 and Jul 10, 2021. FINDINGS:  Cardiovascular: Calcified and noncalcified aortic atherosclerotic plaque. No aneurysmal dilation. Normal heart size without pericardial effusion. Central pulmonary vasculature is opacified of 410 Hounsfield units, adequate for assessment and negative for pulmonary embolism to the segmental level. Subsegmental branches particularly at the lung bases with limited evaluation due to respiratory motion. Mediastinum/Nodes: Extensive pneumomediastinum tracking into the low neck and involving the LEFT greater than RIGHT mediastinal border and extending along the esophagus and tracking underneath the crus of the LEFT hemidiaphragm but not extending into the abdomen. Small associated pneumothorax at the LEFT lung apex and anterior LEFT chest. No signs of mediastinal shift. No adenopathy in the chest. Postoperative changes of prior CABG as before. Median sternotomy changes. Lungs/Pleura: Patchy areas of ground-glass and consolidation that are worse on the LEFT have shown improvement since previous imaging particularly in the RIGHT chest but also in the LEFT chest. Pneumothorax appears mildly loculated. There is a small associated LEFT-sided effusion with resolution of RIGHT-sided pleural fluid. Upper Abdomen: Pneumomediastinum does not extend into the upper abdomen but tracks beneath the LEFT diaphragmatic crus. Post cholecystectomy. Imaged portions of liver, pancreas, spleen, adrenal glands and kidneys without acute process. No acute gastrointestinal findings. Musculoskeletal: Post sternotomy. No acute bone finding. No destructive bone process. Spinal degenerative changes. Review of the MIP images confirms the above findings. IMPRESSION: 1. Negative for pulmonary embolism to the segmental level. 2. Extensive pneumomediastinum tracking into the low neck and involving the LEFT greater than RIGHT mediastinal border and extending along the esophagus and underneath the crus of the LEFT hemidiaphragm but not extending into the  abdomen. 3. Etiology for above process is uncertain. Perhaps cough leading to barotrauma. Esophageal source is another differential consideration. No mediastinal fluid or focal thickening of the esophagus. This does not allow for exclusion of esophageal source but is perhaps reassuring 4. Small associated and partially loculated LEFT-sided pneumothorax at the apex, anteriorly and with a small loculated component in the major fissure. 5. Small LEFT-sided pleural effusion slightly diminished with resolution of RIGHT-sided effusion. 6. Improving airspace disease still with considerable ground-glass and septal thickening in the LEFT chest and worse at the LEFT lung base. 7. Aortic atherosclerosis, coronary artery disease and changes of CABG. Aortic Atherosclerosis (ICD10-I70.0). Electronically Signed: By: GZetta BillsM.D. On: 07/10/2021 10:52   DG CHEST PORT 1 VIEW  Result Date: 07/10/2021 CLINICAL DATA:  Dyspnea EXAM: PORTABLE CHEST 1 VIEW COMPARISON:  07/09/2021 chest radiograph. FINDINGS: Intact sternotomy wires. CABG clips overlie the mediastinum. Normal heart size. Previously questioned left-sided pneumomediastinum in the region of the aortic arch is less distinct on today's radiograph. No  pneumothorax. No significant right pleural effusion. Small left pleural effusion peer is increased. Patchy hazy opacities throughout the left lung, slightly worsened. Mild reticular opacity at the right costophrenic angle is unchanged. IMPRESSION: 1. Small left pleural effusion, increased. 2. Patchy hazy opacities throughout the left lung, slightly worsened, compatible with pneumonia. 3. Previously questioned left-sided pneumomediastinum in the region of the aortic arch appears decreased. Electronically Signed   By: Ilona Sorrel M.D.   On: 07/10/2021 08:08   DG Chest Port 1 View  Result Date: 07/06/2021 CLINICAL DATA:  Admission for respiratory failure EXAM: PORTABLE CHEST 1 VIEW COMPARISON:  Portable chest 07/04/2021.  FINDINGS: 4:15 a.m., 07/06/2021. There are CABG changes mild cardiomegaly without central vascular prominence. Stable mediastinum with aortic tortuosity and atherosclerosis. There is a low inspiration on exam. Diffuse patchy interstitial and alveolar opacities of the left lung fields continue to be noted without improvement or worsening as well as patchy hazy opacities of the right mid to lower lung field which are much less dense. There has been no significant change in overall aeration. There is a small left pleural effusion, trace right pleural effusion. Intact thoracic cage. IMPRESSION: 1. No change in left-greater-than-right interstitial and airspace disease and small left and minimal right pleural effusions. Cardiac size is stable. Electronically Signed   By: Telford Nab M.D.   On: 07/06/2021 07:34   DG Chest Portable 1 View  Result Date: 07/04/2021 CLINICAL DATA:  Short of breath, hypoxia EXAM: PORTABLE CHEST 1 VIEW COMPARISON:  06/26/2021 FINDINGS: Single frontal view of the chest demonstrates stable postsurgical changes from median sternotomy. Cardiac silhouette is unremarkable. There is progressive asymmetric bilateral airspace disease, left greater than right. Small bilateral effusions are suspected. No pneumothorax. No acute bony abnormalities. IMPRESSION: 1. Progressive asymmetric bilateral airspace disease, left greater than right. Findings could reflect infection or asymmetric edema. Electronically Signed   By: Randa Ngo M.D.   On: 07/04/2021 22:36   DG Chest Port 1 View  Result Date: 06/23/2021 CLINICAL DATA:  Pulmonary edema EXAM: PORTABLE CHEST 1 VIEW COMPARISON:  Radiograph 06/21/2021 FINDINGS: Removal of endotracheal and orogastric tubes. There is focal narrowing of the upper trachea. Unchanged cardiomediastinal silhouette with prior median sternotomy and postsurgical changes of CABG. There is persistent diffuse airspace disease, slightly improved in the lung bases. Small bilateral  pleural effusions. No pneumothorax. No acute osseous abnormality. IMPRESSION: Persistent diffuse airspace disease, slightly improved in the lung bases. Stable small effusions. Removal of endotracheal tube with notable focal narrowing of the upper trachea. Electronically Signed   By: Maurine Simmering M.D.   On: 06/23/2021 08:43   DG CHEST PORT 1 VIEW  Result Date: 06/21/2021 CLINICAL DATA:  Central line placement EXAM: PORTABLE CHEST 1 VIEW COMPARISON:  Film from the previous day. FINDINGS: Endotracheal tube is noted just above the carina. Gastric catheter extends into the stomach. Right-sided PICC is seen at the cavoatrial junction. Cardiac shadow is stable. Postsurgical changes are again noted. Bilateral airspace opacities are noted left greater than right stable from the prior exam. IMPRESSION: New right-sided PICC in satisfactory position. The remainder of the study is stable. Electronically Signed   By: Inez Catalina M.D.   On: 06/21/2021 00:23   DG CHEST PORT 1 VIEW  Result Date: 06/20/2021 CLINICAL DATA:  ARDS. EXAM: PORTABLE CHEST 1 VIEW COMPARISON:  Chest x-ray 06/18/2021. FINDINGS: Endotracheal tube tip is 2 cm above the carina. Enteric tube extends below the diaphragm. Patient is status post cardiac surgery. Diffuse multifocal airspace  disease, left greater than right, has not significantly changed compared to prior CT. Small bilateral pleural effusions persist. No evidence for pneumothorax or acute fracture. IMPRESSION: 1. Stable bilateral multifocal airspace disease with small pleural effusions. Electronically Signed   By: Ronney Asters M.D.   On: 06/20/2021 19:35   DG CHEST PORT 1 VIEW  Result Date: 06/20/2021 CLINICAL DATA:  Endotracheal tube placement EXAM: PORTABLE CHEST 1 VIEW COMPARISON:  06/19/2021 FINDINGS: Endotracheal tube approximately 2 cm above the carina. NG tube in the stomach with the tip not visualized. Bilateral airspace disease asymmetric on the left. Mild progression of airspace  disease bilaterally. Postop CABG.  Heart size within normal limits. Small bilateral effusions IMPRESSION: Endotracheal tube 2 cm above the carina Mild progression of diffuse bilateral airspace disease left greater than right which may represent pneumonia Electronically Signed   By: Franchot Gallo M.D.   On: 06/20/2021 18:21   DG CHEST PORT 1 VIEW  Result Date: 06/19/2021 CLINICAL DATA:  Increased shortness of breath. EXAM: PORTABLE CHEST 1 VIEW COMPARISON:  Chest x-ray 06/18/2021.  Chest CT 03/16/2021. FINDINGS: Patient is status post cardiac surgery. Cardiomediastinal silhouette is within normal limits. There is new diffuse multifocal airspace disease throughout the left lung and new patchy airspace disease in the right mid and lower lung laterally. No pleural effusion or pneumothorax identified. No acute fractures. IMPRESSION: 1. New diffuse multifocal left lung airspace disease are new right mid and lower lung airspace disease. Findings are worrisome for multifocal pneumonia. Electronically Signed   By: Ronney Asters M.D.   On: 06/19/2021 23:54   ECHOCARDIOGRAM COMPLETE  Result Date: 07/05/2021    ECHOCARDIOGRAM REPORT   Patient Name:   Roberto Ponce Date of Exam: 07/05/2021 Medical Rec #:  [id number]           Height:       69.0 in Accession #:    [id number]          Weight:       145.7 lb Date of Birth:  1951-01-15           BSA:          1.806 m Patient Age:    64 years            BP:           102/61 mmHg Patient Gender: M                   HR:           76 bpm. Exam Location:  Inpatient Procedure: 2D Echo, Cardiac Doppler and Color Doppler Indications:    Acute respiratory distress  History:        Patient has prior history of Echocardiogram examinations, most                 recent 06/20/2021. CHF, CAD and Previous Myocardial Infarction,                 Signs/Symptoms:Murmur; Risk Factors:Dyslipidemia. CABG x 2.  Sonographer:    Luisa Hart RDCS Referring Phys: [id number] [place]   1. Left ventricular ejection fraction, by estimation, is 40 to 45%. The left ventricle has mildly decreased function. The left ventricle demonstrates regional wall motion abnormalities (see scoring diagram/findings for description). There is mild concentric left ventricular hypertrophy. Left ventricular diastolic parameters are indeterminate. There is akinesis of the left ventricular, basal-mid inferior wall. There is hypokinesis of the left ventricular, mid-apical inferolateral wall  and inferior  wall.  2. Right ventricular systolic function is normal. The right ventricular size is normal. There is mildly elevated pulmonary artery systolic pressure.  3. The mitral valve is abnormal. Mild to moderate mitral valve regurgitation.  4. Tricuspid valve regurgitation is mild to moderate.  5. The aortic valve is normal in structure. Aortic valve regurgitation is not visualized. No aortic stenosis is present. Comparison(s): No significant change from prior study. Prior images reviewed side by side. FINDINGS  Left Ventricle: Left ventricular ejection fraction, by estimation, is 40 to 45%. The left ventricle has mildly decreased function. The left ventricle demonstrates regional wall motion abnormalities. The left ventricular internal cavity size was normal in size. There is mild concentric left ventricular hypertrophy. Left ventricular diastolic parameters are indeterminate. Right Ventricle: The right ventricular size is normal. Right vetricular wall thickness was not well visualized. Right ventricular systolic function is normal. There is mildly elevated pulmonary artery systolic pressure. The tricuspid regurgitant velocity  is 2.71 m/s, and with an assumed right atrial pressure of 8 mmHg, the estimated right ventricular systolic pressure is 54.0 mmHg. Left Atrium: Left atrial size was normal in size. Right Atrium: Right atrial size was normal in size. Pericardium: There is no evidence of pericardial effusion. Mitral Valve:  The mitral valve is abnormal. Mild to moderate mitral valve regurgitation, with eccentric posteriorly directed jet. Tricuspid Valve: The tricuspid valve is normal in structure. Tricuspid valve regurgitation is mild to moderate. No evidence of tricuspid stenosis. Aortic Valve: The aortic valve is normal in structure. Aortic valve regurgitation is not visualized. No aortic stenosis is present. Aortic valve mean gradient measures 3.0 mmHg. Aortic valve peak gradient measures 6.7 mmHg. Aortic valve area, by VTI measures 2.55 cm. Pulmonic Valve: The pulmonic valve was grossly normal. Pulmonic valve regurgitation is not visualized. No evidence of pulmonic stenosis. Aorta: The aortic root, ascending aorta and aortic arch are all structurally normal, with no evidence of dilitation or obstruction. Venous: The inferior vena cava was not well visualized. IAS/Shunts: The interatrial septum was not well visualized.  LEFT VENTRICLE PLAX 2D LVIDd:         4.40 cm     Diastology LVIDs:         3.50 cm     LV e' medial:    8.05 cm/s LV PW:         1.20 cm     LV E/e' medial:  10.1 LV IVS:        1.20 cm     LV e' lateral:   14.30 cm/s LVOT diam:     2.00 cm     LV E/e' lateral: 5.7 LV SV:         58 LV SV Index:   32 LVOT Area:     3.14 cm  LV Volumes (MOD) LV vol d, MOD A2C: 49.9 ml LV vol d, MOD A4C: 71.0 ml LV vol s, MOD A2C: 31.4 ml LV vol s, MOD A4C: 47.8 ml LV SV MOD A2C:     18.5 ml LV SV MOD A4C:     71.0 ml LV SV MOD BP:      23.6 ml RIGHT VENTRICLE RV Basal diam:  3.10 cm RV Mid diam:    2.40 cm RV S prime:     8.33 cm/s TAPSE (M-mode): 1.5 cm LEFT ATRIUM             Index        RIGHT ATRIUM  Index LA diam:        3.90 cm 2.16 cm/m   RA Area:     11.50 cm LA Vol (A2C):   36.5 ml 20.21 ml/m  RA Volume:   28.20 ml  15.62 ml/m LA Vol (A4C):   51.2 ml 28.35 ml/m LA Biplane Vol: 44.0 ml 24.36 ml/m  AORTIC VALVE                    PULMONIC VALVE AV Area (Vmax):    2.36 cm     PV Vmax:       1.15 m/s AV Area  (Vmean):   2.00 cm     PV Vmean:      71.600 cm/s AV Area (VTI):     2.55 cm     PV VTI:        0.188 m AV Vmax:           129.00 cm/s  PV Peak grad:  5.3 mmHg AV Vmean:          85.900 cm/s  PV Mean grad:  2.0 mmHg AV VTI:            0.227 m AV Peak Grad:      6.7 mmHg AV Mean Grad:      3.0 mmHg LVOT Vmax:         96.80 cm/s LVOT Vmean:        54.600 cm/s LVOT VTI:          0.184 m LVOT/AV VTI ratio: 0.81  AORTA Ao Root diam: 2.90 cm Ao Asc diam:  3.00 cm MITRAL VALVE                  TRICUSPID VALVE MV Area (PHT): 5.13 cm       TR Peak grad:   29.4 mmHg MV Decel Time: 148 msec       TR Vmax:        271.00 cm/s MR Peak grad:    82.6 mmHg MR Mean grad:    52.0 mmHg    SHUNTS MR Vmax:         454.50 cm/s  Systemic VTI:  0.18 m MR Vmean:        339.0 cm/s   Systemic Diam: 2.00 cm MR PISA:         0.57 cm MR PISA Eff ROA: 4 mm MR PISA Radius:  0.30 cm MV E velocity: 81.00 cm/s MV A velocity: 86.80 cm/s MV E/A ratio:  0.93 Buford Dresser MD Electronically signed by Buford Dresser MD Signature Date/Time: 07/05/2021/11:58:23 AM    Final    ECHOCARDIOGRAM COMPLETE  Result Date: 06/20/2021    ECHOCARDIOGRAM REPORT   Patient Name:   Roberto Ponce Date of Exam: 06/20/2021 Medical Rec #:  [id number]           Height:       69.0 in Accession #:    [id number]          Weight:       179.4 lb Date of Birth:  1950/11/15           BSA:          1.973 m Patient Age:    30 years            BP:           126/69 mmHg Patient Gender: M  HR:           85 bpm. Exam Location:  Inpatient Procedure: 2D Echo, Cardiac Doppler and Color Doppler Indications:    MR  History:        Patient has prior history of Echocardiogram examinations, most                 recent 01/09/2021. CAD and Previous Myocardial Infarction,                 Signs/Symptoms:Murmur; Risk Factors:Dyslipidemia.  Sonographer:    Luisa Hart RDCS Referring Phys: [id number] [place]  1. Left ventricular ejection  fraction, by estimation, is 40 to 45%. The left ventricle has mildly decreased function. The left ventricle demonstrates regional wall motion abnormalities (see scoring diagram/findings for description). There is mild left ventricular hypertrophy. Left ventricular diastolic parameters are consistent with Grade II diastolic dysfunction (pseudonormalization). Elevated left atrial pressure.  2. Right ventricular systolic function is mildly reduced. The right ventricular size is mildly enlarged. There is moderately elevated pulmonary artery systolic pressure. The estimated right ventricular systolic pressure is 45.4 mmHg.  3. Tricuspid valve regurgitation is mild to moderate.  4. The aortic valve is tricuspid. Aortic valve regurgitation is not visualized. Aortic valve sclerosis is present, with no evidence of aortic valve stenosis.  5. The inferior vena cava is dilated in size with <50% respiratory variability, suggesting right atrial pressure of 15 mmHg.  6. The mitral valve is abnormal. Moderate mitral valve regurgitation. Eccentric posterior directed MR jet. Difficult to quantify severity given eccentric jet, but normal LA/LV size argues against severe MR. Consider TEE or cardiac MRI for further evaluation FINDINGS  Left Ventricle: Left ventricular ejection fraction, by estimation, is 40 to 45%. The left ventricle has mildly decreased function. The left ventricle demonstrates regional wall motion abnormalities. The left ventricular internal cavity size was normal in size. There is mild left ventricular hypertrophy. Left ventricular diastolic parameters are consistent with Grade II diastolic dysfunction (pseudonormalization). Elevated left atrial pressure.  LV Wall Scoring: The mid and distal lateral wall and entire inferior wall are akinetic. The entire anterior wall, antero-lateral wall, entire septum, basal inferolateral segment, and apex are normal. Right Ventricle: The right ventricular size is mildly enlarged.  Right vetricular wall thickness was not well visualized. Right ventricular systolic function is mildly reduced. There is moderately elevated pulmonary artery systolic pressure. The tricuspid  regurgitant velocity is 3.28 m/s, and with an assumed right atrial pressure of 15 mmHg, the estimated right ventricular systolic pressure is 09.8 mmHg. Left Atrium: Left atrial size was normal in size. Right Atrium: Right atrial size was normal in size. Pericardium: There is no evidence of pericardial effusion. Mitral Valve: The mitral valve is abnormal. Moderate mitral valve regurgitation. Tricuspid Valve: The tricuspid valve is normal in structure. Tricuspid valve regurgitation is mild to moderate. Aortic Valve: The aortic valve is tricuspid. Aortic valve regurgitation is not visualized. Aortic valve sclerosis is present, with no evidence of aortic valve stenosis. Aortic valve mean gradient measures 3.0 mmHg. Aortic valve peak gradient measures 6.7  mmHg. Aortic valve area, by VTI measures 2.02 cm. Pulmonic Valve: The pulmonic valve was not well visualized. Pulmonic valve regurgitation is not visualized. Aorta: The aortic root and ascending aorta are structurally normal, with no evidence of dilitation. Venous: The inferior vena cava is dilated in size with less than 50% respiratory variability, suggesting right atrial pressure of 15 mmHg. IAS/Shunts: The interatrial septum was not well visualized.  LEFT VENTRICLE PLAX 2D LVIDd:         5.50 cm     Diastology LVIDs:         4.20 cm     LV e' medial:    4.57 cm/s LV PW:         1.10 cm     LV E/e' medial:  26.0 LV IVS:        1.20 cm     LV e' lateral:   6.42 cm/s LVOT diam:     1.90 cm     LV E/e' lateral: 18.5 LV SV:         49 LV SV Index:   25 LVOT Area:     2.84 cm  LV Volumes (MOD) LV vol d, MOD A2C: 65.7 ml LV vol d, MOD A4C: 58.1 ml LV vol s, MOD A2C: 43.7 ml LV vol s, MOD A4C: 33.5 ml LV SV MOD A2C:     22.0 ml LV SV MOD A4C:     58.1 ml LV SV MOD BP:      24.0 ml  RIGHT VENTRICLE RV Basal diam:  4.60 cm RV Mid diam:    2.70 cm RV S prime:     7.07 cm/s TAPSE (M-mode): 1.7 cm LEFT ATRIUM             Index        RIGHT ATRIUM           Index LA diam:        4.10 cm 2.08 cm/m   RA Area:     15.60 cm LA Vol (A2C):   51.7 ml 26.21 ml/m  RA Volume:   40.80 ml  20.68 ml/m LA Vol (A4C):   66.6 ml 33.76 ml/m LA Biplane Vol: 59.3 ml 30.06 ml/m  AORTIC VALVE                    PULMONIC VALVE AV Area (Vmax):    2.03 cm     PV Vmax:       1.08 m/s AV Area (Vmean):   2.09 cm     PV Vmean:      75.200 cm/s AV Area (VTI):     2.02 cm     PV VTI:        0.232 m AV Vmax:           129.00 cm/s  PV Peak grad:  4.6 mmHg AV Vmean:          80.100 cm/s  PV Mean grad:  3.0 mmHg AV VTI:            0.244 m AV Peak Grad:      6.7 mmHg AV Mean Grad:      3.0 mmHg LVOT Vmax:         92.50 cm/s LVOT Vmean:        59.100 cm/s LVOT VTI:          0.174 m LVOT/AV VTI ratio: 0.71  AORTA Ao Root diam: 3.10 cm Ao Asc diam:  3.30 cm MITRAL VALVE                  TRICUSPID VALVE MV Area (PHT): 3.42 cm       TR Peak grad:   43.0 mmHg MV Decel Time: 222 msec       TR Vmax:        328.00 cm/s MR Peak grad:    110.7  mmHg MR Mean grad:    88.0 mmHg    SHUNTS MR Vmax:         526.00 cm/s  Systemic VTI:  0.17 m MR Vmean:        457.0 cm/s   Systemic Diam: 1.90 cm MR PISA:         1.01 cm MR PISA Eff ROA: 6 mm MR PISA Radius:  0.40 cm MV E velocity: 119.00 cm/s MV A velocity: 79.90 cm/s MV E/A ratio:  1.49 Oswaldo Milian MD Electronically signed by Oswaldo Milian MD Signature Date/Time: 06/20/2021/8:17:40 PM    Final    VAS [place] LOWER EXTREMITY VENOUS (DVT)  Result Date: 07/05/2021  Lower Venous DVT Study Patient Name:  Roberto Ponce  Date of Exam:   07/05/2021 Medical Rec #: [id number]            Accession #:    [id number] Date of Birth: 08/05/1950            Patient Gender: M Patient Age:   55 years Exam Location:  [facility] Procedure:      VAS [place] LOWER EXTREMITY VENOUS (DVT)  Referring Phys: Collier Bullock --------------------------------------------------------------------------------  Indications: Hypoxia.  Limitations: Poor ultrasound/tissue interface and calcific shadowing. Comparison Study: Previous exam on 06/21/21 was negaitve for DVT. Performing Technologist: Rogelia Rohrer RVT, RDMS  Examination Guidelines: A complete evaluation includes B-mode imaging, spectral Doppler, color Doppler, and power Doppler as needed of all accessible portions of each vessel. Bilateral testing is considered an integral part of a complete examination. Limited examinations for reoccurring indications may be performed as noted. The reflux portion of the exam is performed with the patient in reverse Trendelenburg.  +---------+---------------+---------+-----------+----------+--------------+ RIGHT    CompressibilityPhasicitySpontaneityPropertiesThrombus Aging +---------+---------------+---------+-----------+----------+--------------+ CFV      Full           Yes      Yes                                 +---------+---------------+---------+-----------+----------+--------------+ SFJ      Full                                                        +---------+---------------+---------+-----------+----------+--------------+ FV Prox  Full           Yes      Yes                                 +---------+---------------+---------+-----------+----------+--------------+ FV Mid   Full           Yes      Yes                                 +---------+---------------+---------+-----------+----------+--------------+ FV DistalFull           Yes      Yes                                 +---------+---------------+---------+-----------+----------+--------------+ PFV      Full                                                        +---------+---------------+---------+-----------+----------+--------------+  POP      Partial        No       No                   Acute           +---------+---------------+---------+-----------+----------+--------------+ PTV      Partial        No       No                   Acute          +---------+---------------+---------+-----------+----------+--------------+ PERO     None           No       No                   Acute          +---------+---------------+---------+-----------+----------+--------------+ Gastroc  None           No       No                   Acute          +---------+---------------+---------+-----------+----------+--------------+   +---------+---------------+---------+-----------+----------+--------------+ LEFT     CompressibilityPhasicitySpontaneityPropertiesThrombus Aging +---------+---------------+---------+-----------+----------+--------------+ CFV      Full           Yes      Yes                                 +---------+---------------+---------+-----------+----------+--------------+ SFJ      Full                                                        +---------+---------------+---------+-----------+----------+--------------+ FV Prox  Full           Yes      Yes                                 +---------+---------------+---------+-----------+----------+--------------+ FV Mid   Full           Yes      Yes                                 +---------+---------------+---------+-----------+----------+--------------+ FV DistalFull           Yes      Yes                                 +---------+---------------+---------+-----------+----------+--------------+ PFV      Full                                                        +---------+---------------+---------+-----------+----------+--------------+ POP      Full           Yes      Yes                                 +---------+---------------+---------+-----------+----------+--------------+  PTV      Full                                                         +---------+---------------+---------+-----------+----------+--------------+ PERO     Full                                                        +---------+---------------+---------+-----------+----------+--------------+ Rouleaux noted in popliteal vein    Summary: BILATERAL: - No evidence of superficial venous thrombosis in the lower extremities, bilaterally. -No evidence of popliteal cyst, bilaterally. RIGHT: - Findings consistent with acute deep vein thrombosis involving the right popliteal vein, right posterior tibial veins, right peroneal veins, and right gastrocnemius veins.  LEFT: - There is no evidence of deep vein thrombosis in the lower extremity.  *See table(s) above for measurements and observations. Electronically signed by Jamelle Haring on 07/05/2021 at 9:22:24 PM.    Final    VAS [place] LOWER EXTREMITY VENOUS (DVT)  Result Date: 06/21/2021  Lower Venous DVT Study Patient Name:  Roberto Ponce  Date of Exam:   06/21/2021 Medical Rec #: [id number]            Accession #:    [id number] Date of Birth: 1950-07-04            Patient Gender: M Patient Age:   27 years Exam Location:  [facility] Procedure:      VAS [place] LOWER EXTREMITY VENOUS (DVT) Referring Phys: MURALI RAMASWAMY --------------------------------------------------------------------------------  Indications: Edema.  Comparison Study: No previous exams Performing Technologist: Jody Hill RVT, RDMS  Examination Guidelines: A complete evaluation includes B-mode imaging, spectral Doppler, color Doppler, and power Doppler as needed of all accessible portions of each vessel. Bilateral testing is considered an integral part of a complete examination. Limited examinations for reoccurring indications may be performed as noted. The reflux portion of the exam is performed with the patient in reverse Trendelenburg.  +---------+---------------+---------+-----------+----------+--------------+ RIGHT     CompressibilityPhasicitySpontaneityPropertiesThrombus Aging +---------+---------------+---------+-----------+----------+--------------+ CFV      Full           Yes      Yes                                 +---------+---------------+---------+-----------+----------+--------------+ SFJ      Full                                                        +---------+---------------+---------+-----------+----------+--------------+ FV Prox  Full           Yes      Yes                                 +---------+---------------+---------+-----------+----------+--------------+ FV Mid   Full           Yes      Yes                                 +---------+---------------+---------+-----------+----------+--------------+  FV DistalFull           Yes      Yes                                 +---------+---------------+---------+-----------+----------+--------------+ PFV      Full                                                        +---------+---------------+---------+-----------+----------+--------------+ POP      Full           Yes      Yes                                 +---------+---------------+---------+-----------+----------+--------------+ PTV      Full                                                        +---------+---------------+---------+-----------+----------+--------------+ PERO     Full                                                        +---------+---------------+---------+-----------+----------+--------------+   +---------+---------------+---------+-----------+----------+--------------+ LEFT     CompressibilityPhasicitySpontaneityPropertiesThrombus Aging +---------+---------------+---------+-----------+----------+--------------+ CFV      Full           Yes      Yes                                 +---------+---------------+---------+-----------+----------+--------------+ SFJ      Full                                                         +---------+---------------+---------+-----------+----------+--------------+ FV Prox  Full           Yes      Yes                                 +---------+---------------+---------+-----------+----------+--------------+ FV Mid   Full           Yes      Yes                                 +---------+---------------+---------+-----------+----------+--------------+ FV DistalFull           Yes      Yes                                 +---------+---------------+---------+-----------+----------+--------------+ PFV      Full                                                        +---------+---------------+---------+-----------+----------+--------------+  POP      Full           Yes      Yes                                 +---------+---------------+---------+-----------+----------+--------------+ PTV      Full                                                        +---------+---------------+---------+-----------+----------+--------------+ PERO     Full                                                        +---------+---------------+---------+-----------+----------+--------------+ Rouleaux flow seen in left popliteal vein    Summary: BILATERAL: - No evidence of deep vein thrombosis seen in the lower extremities, bilaterally. - No evidence of superficial venous thrombosis in the lower extremities, bilaterally. -No evidence of popliteal cyst, bilaterally.   *See table(s) above for measurements and observations. Electronically signed by Harold Barban MD on 06/21/2021 at 9:25:11 PM.    Final    [place] EKG SITE RITE  Result Date: 06/20/2021 If Site Rite image not attached, placement could not be confirmed due to current cardiac rhythm.  US Abdomen Limited RUQ (LIVER/GB)  Result Date: 06/23/2021 CLINICAL DATA:  Elevated liver function tests EXAM: ULTRASOUND ABDOMEN LIMITED RIGHT UPPER QUADRANT COMPARISON:  CT 03/16/2021 FINDINGS: Gallbladder: Surgically absent.  Common bile duct: Diameter: 3 mm. Liver: No focal lesion identified. Within normal limits in parenchymal echogenicity. Portal vein is patent on color Doppler imaging with normal direction of blood flow towards the liver. Other: None. IMPRESSION: Unremarkable right upper quadrant ultrasound status post cholecystectomy. Electronically Signed   By: Davina Poke D.O.   On: 06/23/2021 10:09   DG ESOPHAGUS W SINGLE CM (SOL OR THIN BA)  Result Date: 07/10/2021 CLINICAL DATA:  Pneumomediastinum.  Rule out esophageal perforation. EXAM: ESOPHOGRAM/BARIUM SWALLOW TECHNIQUE: Single contrast examination was performed using water-soluble contrast material. FLUOROSCOPY: Radiation Exposure Index (as provided by the fluoroscopic device): 6.30 mGy Kerma COMPARISON:  Chest CTA 07/10/2021 FINDINGS: A limited water-soluble esophagram was performed. The patient drank contrast without difficulty. The esophagus is normal in caliber, and contrast passed freely into the stomach. No contrast extravasation was observed to indicate an esophageal perforation. There is a small sliding hiatal hernia. IMPRESSION: No evidence of esophageal perforation. Electronically Signed   By: Logan Bores M.D.   On: 07/10/2021 13:55       The results of significant diagnostics from this hospitalization (including imaging, microbiology, ancillary and laboratory) are listed below for reference.     Microbiology: Recent Results (from the past 240 hour(s))  Resp Panel by RT-PCR (Flu A&B, Covid) Nasopharyngeal Swab     Status: None   Collection Time: 07/04/21 10:10 PM   Specimen: Nasopharyngeal Swab; Nasopharyngeal(NP) swabs in vial transport medium  Result Value Ref Range Status   SARS Coronavirus 2 by RT PCR NEGATIVE NEGATIVE Final    Comment: (NOTE) SARS-CoV-2 target nucleic acids are NOT DETECTED.  The SARS-CoV-2 RNA is generally detectable in upper respiratory specimens during the acute  phase of infection. The lowest concentration of  SARS-CoV-2 viral copies this assay can detect is 138 copies/mL. A negative result does not preclude SARS-Cov-2 infection and should not be used as the sole basis for treatment or other patient management decisions. A negative result may occur with  improper specimen collection/handling, submission of specimen other than nasopharyngeal swab, presence of viral mutation(s) within the areas targeted by this assay, and inadequate number of viral copies(<138 copies/mL). A negative result must be combined with clinical observations, patient history, and epidemiological information. The expected result is Negative.  Fact Sheet for Patients:  EntrepreneurPulse.com.au  Fact Sheet for Healthcare Providers:  IncredibleEmployment.be  This test is no t yet approved or cleared by the [place] FDA and  has been authorized for detection and/or diagnosis of SARS-CoV-2 by FDA under an Emergency Use Authorization (EUA). This EUA will remain  in effect (meaning this test can be used) for the duration of the COVID-19 declaration under Section 564(b)(1) of the Act, 21 U.S.C.section 360bbb-3(b)(1), unless the authorization is terminated  or revoked sooner.       Influenza A by PCR NEGATIVE NEGATIVE Final   Influenza B by PCR NEGATIVE NEGATIVE Final    Comment: (NOTE) The Xpert Xpress SARS-CoV-2/FLU/RSV plus assay is intended as an aid in the diagnosis of influenza from Nasopharyngeal swab specimens and should not be used as a sole basis for treatment. Nasal washings and aspirates are unacceptable for Xpert Xpress SARS-CoV-2/FLU/RSV testing.  Fact Sheet for Patients: EntrepreneurPulse.com.au  Fact Sheet for Healthcare Providers: IncredibleEmployment.be  This test is not yet approved or cleared by the [place] FDA and has been authorized for detection and/or diagnosis of SARS-CoV-2 by FDA under an Emergency Use  Authorization (EUA). This EUA will remain in effect (meaning this test can be used) for the duration of the COVID-19 declaration under Section 564(b)(1) of the Act, 21 U.S.C. section 360bbb-3(b)(1), unless the authorization is terminated or revoked.  Performed at [facility], [place] [address] [place], [place] [address]   Culture, blood (Routine X 2) w Reflex to ID Panel     Status: None   Collection Time: 07/05/21  1:00 AM   Specimen: BLOOD  Result Value Ref Range Status   Specimen Description   Final    BLOOD BLOOD RIGHT WRIST Performed at [facility] [address], [place], [place] [address]    Special Requests   Final    BOTTLES DRAWN AEROBIC AND ANAEROBIC Blood Culture adequate volume Performed at [facility] [address] [place], [place] [address]    Culture   Final    NO GROWTH 5 DAYS Performed at [facility], [place] [address] [place], [place] [address]    Report Status 07/10/2021 FINAL  Final  Culture, blood (Routine X 2) w Reflex to ID Panel     Status: Abnormal   Collection Time: 07/05/21  1:07 AM   Specimen: BLOOD  Result Value Ref Range Status   Specimen Description   Final    BLOOD LEFT ANTECUBITAL Performed at [facility] [address] [place], [place] [address]    Special Requests   Final    BOTTLES DRAWN AEROBIC AND ANAEROBIC Blood Culture adequate volume Performed at [facility] [address], [place], [place] [address]    Culture  Setup Time   Final    GRAM POSITIVE COCCI IN BOTH AEROBIC AND ANAEROBIC BOTTLES CRITICAL RESULT CALLED TO,  READ BACK BY AND VERIFIED WITH: PHARMD ELLEN JACKSON 07/06/21'@3'$ :08 BY TW    Culture (A)  Final    STAPHYLOCOCCUS EPIDERMIDIS THE SIGNIFICANCE OF ISOLATING THIS ORGANISM FROM A SINGLE SET OF BLOOD CULTURES WHEN MULTIPLE SETS ARE DRAWN IS UNCERTAIN. PLEASE NOTIFY THE MICROBIOLOGY DEPARTMENT WITHIN ONE WEEK IF  SPECIATION AND SENSITIVITIES ARE REQUIRED. Performed at [facility], [place] [address], [place], [place] [address]    Report Status 07/08/2021 FINAL  Final  Blood Culture ID Panel (Reflexed)     Status: Abnormal   Collection Time: 07/05/21  1:07 AM  Result Value Ref Range Status   Enterococcus faecalis NOT DETECTED NOT DETECTED Final   Enterococcus Faecium NOT DETECTED NOT DETECTED Final   Listeria monocytogenes NOT DETECTED NOT DETECTED Final   Staphylococcus species DETECTED (A) NOT DETECTED Final    Comment: CRITICAL RESULT CALLED TO, READ BACK BY AND VERIFIED WITH: PHARMD ELLEN JACKSON 07/06/21'@3'$ :08 BY TW    Staphylococcus aureus (BCID) NOT DETECTED NOT DETECTED Final   Staphylococcus epidermidis DETECTED (A) NOT DETECTED Final    Comment: Methicillin (oxacillin) resistant coagulase negative staphylococcus. Possible blood culture contaminant (unless isolated from more than one blood culture draw or clinical case suggests pathogenicity). No antibiotic treatment is indicated for blood  culture contaminants. CRITICAL RESULT CALLED TO, READ BACK BY AND VERIFIED WITH: PHARMD ELLEN JACKSON 07/06/21'@3'$ :08 BY TW    Staphylococcus lugdunensis NOT DETECTED NOT DETECTED Final   Streptococcus species NOT DETECTED NOT DETECTED Final   Streptococcus agalactiae NOT DETECTED NOT DETECTED Final   Streptococcus pneumoniae NOT DETECTED NOT DETECTED Final   Streptococcus pyogenes NOT DETECTED NOT DETECTED Final   A.calcoaceticus-baumannii NOT DETECTED NOT DETECTED Final   Bacteroides fragilis NOT DETECTED NOT DETECTED Final   Enterobacterales NOT DETECTED NOT DETECTED Final   Enterobacter cloacae complex NOT DETECTED NOT DETECTED Final   Escherichia coli NOT DETECTED NOT DETECTED Final   Klebsiella aerogenes NOT DETECTED NOT DETECTED Final   Klebsiella oxytoca NOT DETECTED NOT DETECTED Final   Klebsiella pneumoniae NOT DETECTED NOT DETECTED Final   Proteus species NOT DETECTED NOT DETECTED Final    Salmonella species NOT DETECTED NOT DETECTED Final   Serratia marcescens NOT DETECTED NOT DETECTED Final   Haemophilus influenzae NOT DETECTED NOT DETECTED Final   Neisseria meningitidis NOT DETECTED NOT DETECTED Final   Pseudomonas aeruginosa NOT DETECTED NOT DETECTED Final   Stenotrophomonas maltophilia NOT DETECTED NOT DETECTED Final   Candida albicans NOT DETECTED NOT DETECTED Final   Candida auris NOT DETECTED NOT DETECTED Final   Candida glabrata NOT DETECTED NOT DETECTED Final   Candida krusei NOT DETECTED NOT DETECTED Final   Candida parapsilosis NOT DETECTED NOT DETECTED Final   Candida tropicalis NOT DETECTED NOT DETECTED Final   Cryptococcus neoformans/gattii NOT DETECTED NOT DETECTED Final   Methicillin resistance mecA/C DETECTED (A) NOT DETECTED Final    Comment: CRITICAL RESULT CALLED TO, READ BACK BY AND VERIFIED WITH: PHARMD ELLEN JACKSON 07/06/21'@3'$ :08 BY TW Performed at [facility], [address], [place], [place] [address]   MRSA Next Gen by PCR, Nasal     Status: None   Collection Time: 07/06/21  9:50 PM   Specimen: Nasal Mucosa; Nasal Swab  Result Value Ref Range Status   MRSA by PCR Next Gen NOT DETECTED NOT DETECTED Final    Comment: (NOTE) The GeneXpert MRSA Assay (FDA approved for NASAL specimens only), is one component of a comprehensive MRSA colonization surveillance program. It is not intended  to diagnose MRSA infection nor to guide or monitor treatment for MRSA infections. Test performance is not FDA approved in patients less than 56 years old. Performed at [facility], [place] [address], [place], [place] [address]   Culture, blood (Routine X 2) w Reflex to ID Panel     Status: None (Preliminary result)   Collection Time: 07/08/21  9:31 AM   Specimen: BLOOD  Result Value Ref Range Status   Specimen Description   Final    BLOOD BLOOD LEFT HAND Performed at [facility] [address], [place], [place]  [address]    Special Requests   Final    IN PEDIATRIC BOTTLE Blood Culture adequate volume Performed at [facility] [address], [place], [place] [address]    Culture   Final    NO GROWTH 4 DAYS Performed at [facility], [place] [address] [place], [place] [address]    Report Status PENDING  Incomplete  Culture, blood (Routine X 2) w Reflex to ID Panel     Status: None (Preliminary result)   Collection Time: 07/08/21 10:10 AM   Specimen: BLOOD  Result Value Ref Range Status   Specimen Description   Final    BLOOD BLOOD RIGHT HAND Performed at [facility] [address], [place], [place] [address]    Special Requests   Final    BOTTLES DRAWN AEROBIC AND ANAEROBIC Blood Culture results may not be optimal due to an inadequate volume of blood received in culture bottles Performed at [facility] [address], [place], [place] [address]    Culture   Final    NO GROWTH 4 DAYS Performed at [facility], [place] [address] [place], [place] [address]    Report Status PENDING  Incomplete     Labs:  CBC: Recent Labs  Lab 07/10/21 1953 07/11/21 0347 07/11/21 1138 07/11/21 2023 07/12/21 0402  WBC 16.4* 16.9* 17.6* 14.1* 18.1*  HGB 8.8* 9.1* 8.6* 9.2* 9.3*  HCT 27.1* 28.1* 26.5* 28.9* 29.3*  MCV 93.1 93.4 93.3 93.2 93.3  PLT 451* 456* 424* 497* 471*   BMP &GFR Recent Labs  Lab 07/07/21 0427 07/08/21 0425 07/08/21 0931 07/09/21 0330 07/10/21 0837 07/11/21 0347  NA 128* 131*  --  130* 131* 131*  K 4.5 4.3  --  4.4 4.0 4.6  CL 90* 94*  --  93* 95* 98  CO2 29 31  --  '30 29 29  '$ GLUCOSE 143* 132*  --  121* 90 114*  BUN 40* 34*  --  27* 21 21  CREATININE 0.69 0.65  --  0.55* 0.64 0.63  CALCIUM 8.4* 8.4*  --  8.4* 8.0* 8.3*  MG  --   --  2.3  --   --   --   PHOS  --   --  4.0  --   --   --    Estimated Creatinine Clearance: 80.6 mL/min (by C-G formula based on SCr of 0.63 mg/dL). Liver & Pancreas: No  results for input(s): AST, ALT, ALKPHOS, BILITOT, PROT, ALBUMIN in the last 168 hours. No results for input(s): LIPASE, AMYLASE in the last 168 hours. No results for input(s): AMMONIA in the last 168 hours. Diabetic: No results for input(s): HGBA1C in the last 72 hours. No results for input(s): GLUCAP in the last 168 hours. Cardiac Enzymes: No results for input(s): CKTOTAL, CKMB, CKMBINDEX, TROPONINI in the last 168 hours.  No results for input(s): PROBNP in the last 8760 hours. Coagulation Profile: No results for input(s): INR, PROTIME in the last 168 hours. Thyroid Function Tests: No results for input(s): TSH, T4TOTAL, FREET4, T3FREE, THYROIDAB in the last 72 hours. Lipid Profile: No results for input(s): CHOL, HDL, LDLCALC, TRIG, CHOLHDL, LDLDIRECT in the last 72 hours. Anemia Panel: No results for input(s): VITAMINB12, FOLATE, FERRITIN, TIBC, IRON, RETICCTPCT in the last 72 hours. Urine analysis:    Component Value Date/Time   COLORURINE STRAW (A) 07/05/2021 0043   APPEARANCEUR CLEAR 07/05/2021 0043   LABSPEC 1.005 07/05/2021 0043   PHURINE 7.0 07/05/2021 0043   GLUCOSEU 50 (A) 07/05/2021 0043   HGBUR SMALL (A) 07/05/2021 0043   BILIRUBINUR NEGATIVE 07/05/2021 [place] 07/05/2021 0043   PROTEINUR NEGATIVE 07/05/2021 0043   NITRITE NEGATIVE 07/05/2021 0043   LEUKOCYTESUR NEGATIVE 07/05/2021 0043   Sepsis Labs: Invalid input(s): PROCALCITONIN, LACTICIDVEN   Time coordinating discharge: 55 minutes  SIGNED:  Mercy Riding, MD  [facility] 07/12/2021, 9:30 PM

## 2021-07-12 NOTE — TOC Progression Note (Signed)
Transition of Care [facility]) - Progression Note    Patient Details  Name: Roberto Ponce MRN: [id number] Date of Birth: 1950/03/27  [facility]) CM/SW Contact  Leeroy Cha, RN Phone Number: 07/12/2021, 9:33 AM  Clinical Narrative:    [place] for toc needs. Plan is to return to home.   Expected Discharge Plan: [place] Barriers to Discharge: No Barriers Identified  Expected Discharge Plan and Services Expected Discharge Plan: [place]   Discharge Planning Services: CM Consult Post Acute Care Choice: [place] arrangements for the past 2 months: Single Family Home                                       Social Determinants of Health (SDOH) Interventions    Readmission Risk Interventions     View : No data to display.

## 2021-07-12 NOTE — Progress Notes (Addendum)
Progress Note  Patient Name: Roberto Ponce Date of Encounter: 07/12/2021  [facility] Cardiologist: Werner Lean, MD   Subjective   Breathing is stable.   Inpatient Medications    Scheduled Meds:  brimonidine  1 drop Left Eye BID   carvedilol  3.125 mg Oral BID WC   chlorhexidine  15 mL Mouth Rinse BID   clopidogrel  75 mg Oral Daily   dapagliflozin propanediol  10 mg Oral Daily   dorzolamide-timolol  1 drop Both Eyes BID   enoxaparin (LOVENOX) injection  65 mg Subcutaneous Q12H   ezetimibe  10 mg Oral Daily   feeding supplement  237 mL Oral BID BM   guaiFENesin  600 mg Oral BID   latanoprost  1 drop Both Eyes QHS   pantoprazole (PROTONIX) IV  40 mg Intravenous Q12H   predniSONE  40 mg Oral Q breakfast   Followed by   Derrill Memo ON 07/16/2021] predniSONE  30 mg Oral Q breakfast   Followed by   Derrill Memo ON 07/21/2021] predniSONE  20 mg Oral Q breakfast   Followed by   Derrill Memo ON 07/26/2021] predniSONE  10 mg Oral Q breakfast   Followed by   Derrill Memo ON 07/31/2021] predniSONE  5 mg Oral Q breakfast   Followed by   Derrill Memo ON 08/05/2021] predniSONE  2.5 mg Oral Q breakfast   Continuous Infusions:  cefTRIAXone (ROCEPHIN)  IV 2 g (07/11/21 1302)   metronidazole 500 mg (07/12/21 0150)   PRN Meds: docusate sodium, ipratropium-albuterol, ondansetron (ZOFRAN) IV, polyethylene glycol   Vital Signs    Vitals:   07/11/21 0545 07/11/21 1600 07/11/21 2105 07/12/21 0451  BP: (!) 109/57  (!) 114/56 115/72  Pulse: 68  83 87  Resp: '20  20 20  '$ Temp: 97.7 F (36.5 C)  (!) 97.5 F (36.4 C) (!) 97.5 F (36.4 C)  TempSrc: Oral  Oral Oral  SpO2: 97% 100% 99% 97%  Weight:      Height:        Intake/Output Summary (Last 24 hours) at 07/12/2021 0839 Last data filed at 07/12/2021 0829 Gross per 24 hour  Intake 1067.21 ml  Output 2651 ml  Net -1583.79 ml      07/09/2021    5:00 AM 07/08/2021    5:00 AM 07/07/2021    5:00 AM  Last 3 Weights  Weight (lbs) 146 lb 4.1 oz 144  lb 1.6 oz 142 lb 1.6 oz  Weight (kg) 66.34 kg 65.363 kg 64.456 kg      Telemetry    NSR with PVCs - Personally Reviewed  ECG    NSR with q wave in the inferior leads - Personally Reviewed  Physical Exam   GEN: No acute distress.   Neck: No JVD Cardiac: RRR, no murmurs, rubs, or gallops.  Respiratory: Clear to auscultation bilaterally. GI: Soft, nontender, non-distended  MS: 1-2+ LLE edema; No deformity. Neuro:  Nonfocal  Psych: Normal affect   Labs    High Sensitivity Troponin:   Recent Labs  Lab 06/20/21 1723 06/21/21 0500 07/04/21 2210 07/04/21 2350  TROPONINIHS 61* '16 8 10     '$ Chemistry Recent Labs  Lab 07/08/21 0931 07/09/21 0330 07/10/21 0837 07/11/21 0347  NA  --  130* 131* 131*  K  --  4.4 4.0 4.6  CL  --  93* 95* 98  CO2  --  '30 29 29  '$ GLUCOSE  --  121* 90 114*  BUN  --  27* 21 21  CREATININE  --  0.55* 0.64 0.63  CALCIUM  --  8.4* 8.0* 8.3*  MG 2.3  --   --   --   GFRNONAA  --  >60 >60 >60  ANIONGAP  --  7 7 4*    Lipids No results for input(s): CHOL, TRIG, HDL, LABVLDL, LDLCALC, CHOLHDL in the last 168 hours.  Hematology Recent Labs  Lab 07/11/21 1138 07/11/21 2023 07/12/21 0402  WBC 17.6* 14.1* 18.1*  RBC 2.84* 3.10* 3.14*  HGB 8.6* 9.2* 9.3*  HCT 26.5* 28.9* 29.3*  MCV 93.3 93.2 93.3  MCH 30.3 29.7 29.6  MCHC 32.5 31.8 31.7  RDW 17.3* 17.9* 17.8*  PLT 424* 497* 471*   Thyroid No results for input(s): TSH, FREET4 in the last 168 hours.  BNPNo results for input(s): BNP, PROBNP in the last 168 hours.  DDimer No results for input(s): DDIMER in the last 168 hours.   Radiology    DG Chest 2 View  Result Date: 07/11/2021 CLINICAL DATA:  Follow-up.  Pneumo mediastinum. EXAM: CHEST - 2 VIEW COMPARISON:  07/10/2021. FINDINGS: Stable cardiomediastinal contours. Pneumomediastinum is again noted bilaterally. Gas is noted tracking into the lower neck as before. Unchanged asymmetric opacification throughout the left lung. Mild peripheral  opacities within the right lower lung is also unchanged. No signs of pneumothorax. Trace left pleural fluid. Unchanged. IMPRESSION: 1. No change in the appearance of pneumomediastinum compared with recent chest CT from 07/10/2021. 2. No change in aeration to the left lung. Electronically Signed   By: Kerby Moors M.D.   On: 07/11/2021 11:15   CT Angio Chest Pulmonary Embolism (PE) W or WO Contrast  Addendum Date: 07/10/2021   ADDENDUM REPORT: 07/10/2021 11:00 ADDENDUM: These results were called by telephone at the time of interpretation on 07/10/2021 at 11:00 am to provider Dr. Tyrell Antonio, Who verbally acknowledged these results. Electronically Signed   By: Zetta Bills M.D.   On: 07/10/2021 11:00   Result Date: 07/10/2021 CLINICAL DATA:  70 year old male presents for evaluation of shortness of breath and suspected pneumomediastinum. Also with known DVT. EXAM: CT ANGIOGRAPHY CHEST WITH CONTRAST TECHNIQUE: Multidetector CT imaging of the chest was performed using the standard protocol during bolus administration of intravenous contrast. Multiplanar CT image reconstructions and MIPs were obtained to evaluate the vascular anatomy. RADIATION DOSE REDUCTION: This exam was performed according to the departmental dose-optimization program which includes automated exposure control, adjustment of the mA and/or kV according to patient size and/or use of iterative reconstruction technique. CONTRAST:  59m OMNIPAQUE IOHEXOL 350 MG/ML SOLN COMPARISON:  CT of the chest from Jun 20, 2021. Chest x-ray of May 21 and Jul 10, 2021. FINDINGS: Cardiovascular: Calcified and noncalcified aortic atherosclerotic plaque. No aneurysmal dilation. Normal heart size without pericardial effusion. Central pulmonary vasculature is opacified of 410 Hounsfield units, adequate for assessment and negative for pulmonary embolism to the segmental level. Subsegmental branches particularly at the lung bases with limited evaluation due to respiratory  motion. Mediastinum/Nodes: Extensive pneumomediastinum tracking into the low neck and involving the LEFT greater than RIGHT mediastinal border and extending along the esophagus and tracking underneath the crus of the LEFT hemidiaphragm but not extending into the abdomen. Small associated pneumothorax at the LEFT lung apex and anterior LEFT chest. No signs of mediastinal shift. No adenopathy in the chest. Postoperative changes of prior CABG as before. Median sternotomy changes. Lungs/Pleura: Patchy areas of ground-glass and consolidation that are worse on the LEFT have shown improvement since previous imaging particularly in  the RIGHT chest but also in the LEFT chest. Pneumothorax appears mildly loculated. There is a small associated LEFT-sided effusion with resolution of RIGHT-sided pleural fluid. Upper Abdomen: Pneumomediastinum does not extend into the upper abdomen but tracks beneath the LEFT diaphragmatic crus. Post cholecystectomy. Imaged portions of liver, pancreas, spleen, adrenal glands and kidneys without acute process. No acute gastrointestinal findings. Musculoskeletal: Post sternotomy. No acute bone finding. No destructive bone process. Spinal degenerative changes. Review of the MIP images confirms the above findings. IMPRESSION: 1. Negative for pulmonary embolism to the segmental level. 2. Extensive pneumomediastinum tracking into the low neck and involving the LEFT greater than RIGHT mediastinal border and extending along the esophagus and underneath the crus of the LEFT hemidiaphragm but not extending into the abdomen. 3. Etiology for above process is uncertain. Perhaps cough leading to barotrauma. Esophageal source is another differential consideration. No mediastinal fluid or focal thickening of the esophagus. This does not allow for exclusion of esophageal source but is perhaps reassuring 4. Small associated and partially loculated LEFT-sided pneumothorax at the apex, anteriorly and with a small  loculated component in the major fissure. 5. Small LEFT-sided pleural effusion slightly diminished with resolution of RIGHT-sided effusion. 6. Improving airspace disease still with considerable ground-glass and septal thickening in the LEFT chest and worse at the LEFT lung base. 7. Aortic atherosclerosis, coronary artery disease and changes of CABG. Aortic Atherosclerosis (ICD10-I70.0). Electronically Signed: By: Zetta Bills M.D. On: 07/10/2021 10:52   DG ESOPHAGUS W SINGLE CM (SOL OR THIN BA)  Result Date: 07/10/2021 CLINICAL DATA:  Pneumomediastinum.  Rule out esophageal perforation. EXAM: ESOPHOGRAM/BARIUM SWALLOW TECHNIQUE: Single contrast examination was performed using water-soluble contrast material. FLUOROSCOPY: Radiation Exposure Index (as provided by the fluoroscopic device): 6.30 mGy Kerma COMPARISON:  Chest CTA 07/10/2021 FINDINGS: A limited water-soluble esophagram was performed. The patient drank contrast without difficulty. The esophagus is normal in caliber, and contrast passed freely into the stomach. No contrast extravasation was observed to indicate an esophageal perforation. There is a small sliding hiatal hernia. IMPRESSION: No evidence of esophageal perforation. Electronically Signed   By: Logan Bores M.D.   On: 07/10/2021 13:55    Cardiac Studies   Cath 02/14/2022 1.  Severe 3-vessel coronary artery disease with total occlusion of the LAD and total occlusion of the RCA, and severe stenosis of the native left circumflex 2.  Status post CABG with continued patency of the LIMA to LAD and RIMA to RCA 3.  Successful PCI of severe 95% stenosis of the mid circumflex, treated with a 3.0 x 15 mm Onyx frontier DES 4.  Severe mitral regurgitation by echo assessment, hemodynamics demonstrate low wedge pressure, no V wave, and no pulmonary hypertension.  Recommendations: DAPT with aspirin and clopidogrel x6 months, repeat echocardiogram in 6 months, defer transcatheter edge-to-edge mitral  valve repair at present to reevaluate after PCI.   Echo 07/05/2021  1. Left ventricular ejection fraction, by estimation, is 40 to 45%. The  left ventricle has mildly decreased function. The left ventricle  demonstrates regional wall motion abnormalities (see scoring  diagram/findings for description). There is mild  concentric left ventricular hypertrophy. Left ventricular diastolic  parameters are indeterminate. There is akinesis of the left ventricular,  basal-mid inferior wall. There is hypokinesis of the left ventricular,  mid-apical inferolateral wall and inferior   wall.   2. Right ventricular systolic function is normal. The right ventricular  size is normal. There is mildly elevated pulmonary artery systolic  pressure.   3.  The mitral valve is abnormal. Mild to moderate mitral valve  regurgitation.   4. Tricuspid valve regurgitation is mild to moderate.   5. The aortic valve is normal in structure. Aortic valve regurgitation is  not visualized. No aortic stenosis is present.   Comparison(s): No significant change from prior study. Prior images  reviewed side by side.    CTA of chest 07/10/2021 IMPRESSION: 1. Negative for pulmonary embolism to the segmental level. 2. Extensive pneumomediastinum tracking into the low neck and involving the LEFT greater than RIGHT mediastinal border and extending along the esophagus and underneath the crus of the LEFT hemidiaphragm but not extending into the abdomen. 3. Etiology for above process is uncertain. Perhaps cough leading to barotrauma. Esophageal source is another differential consideration. No mediastinal fluid or focal thickening of the esophagus. This does not allow for exclusion of esophageal source but is perhaps reassuring 4. Small associated and partially loculated LEFT-sided pneumothorax at the apex, anteriorly and with a small loculated component in the major fissure. 5. Small LEFT-sided pleural effusion slightly  diminished with resolution of RIGHT-sided effusion. 6. Improving airspace disease still with considerable ground-glass and septal thickening in the LEFT chest and worse at the LEFT lung base. 7. Aortic atherosclerosis, coronary artery disease and changes of CABG.  Patient Profile     71 y.o. 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 Cardiology is following since 07/06/2021 for evaluation of CHF  Assessment & Plan    Acute hypoxic respiratory failure: - recent admission from 4/30-5/10 for resp failure from viral PNA with post viral BOOP, auce pulm edema and hyponatreamia. Readmitted on 5/16 with worsening dyspnea  - CTA 5/22 negative for PE, extensive pneumomediastinum tracking into the low neck and involving the L>R mediastinal border and extending along the esophagus and underneath the crus of L hemidiaphgram, ?if caused by coughing vs esophageal source (ruled out by esophagogram). Small associated and partially loculated L sided pneumothorax at apex. Esophagogram negative for perforation.    - euvolemic on exam, RLE edema likely related to DVT, no edema in the LLE  Chronic systolic and diastolic heart failure  - Echo 5/17 showed EF 40-45%, akinesis of LV basal mid inferior wall, hypokinesis of LV mid apical inferolateral wall and inferior wall, mildly elevated pulm artery systolic pressure, mild to moderate MR, mild to moderate TR.  - coreg, farxiga, not on losartan due to hypotension. Spironolactone current held, resume on discharge.   - euvolemic on exam today  Moderate to severe MR: stable from symptomatic perspective, plan to follow up with Dr. Burt Knack after discharge.   CAD s/p CABG 1988 and PCI to mid LCx 02/14/2021  - plavix held for 5 days after admission, resumed on 5/22. ASA continued for 2 days after  admission, discontinued after 5/18 given the need for anticoagulation due to presence of DVT.   - denies any chest pain  Acute DVT of R popliteal/posterior tibial/peroneal/gastrocnemius vein: noted on doppler 07/05/2021. Echo 5/17 without R heart strain  - on lovenox, switch NOAC when able. Plan for plavix + Eliquis on discharge.   Hyponatremia: was felt due to SIADH in the setting of PNA last admission.   Staph epidermidis in 1/2 blood culture 5/17  Bloody stool: per GI, rectal bleeding seems to have improved, no plan for procedure at this time. Supportive care,  protonix '40mg'$  BID.       For questions or updates, please contact [place] Please consult [email or web address] for contact info under    Patient examined chart reviewed Discussed care with patient/wife Lung exam still abnormal with rhonchi and inspiratory "pops" Plavix for stent done 01/2021 Change to eliquis dosing for DVT. Pneumomediastinum/BOOP per pulmonary MR only mild to moderate on most recent echo   Jenkins Rouge MD [place], [place], [place]  07/12/2021, 8:39 AM

## 2021-07-12 NOTE — Discharge Instructions (Signed)

## 2021-07-12 NOTE — Progress Notes (Signed)
NAME:  Roberto Ponce, MRN:  [id number], DOB:  1950/05/28, LOS: 7 ADMISSION DATE:  07/04/2021, CONSULTATION DATE:  07/04/2021 REFERRING MD:  Dr. 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  In evening of 07/04/21 he developed sudden onset of dyspnea associated with hypoxia with SpO2 in low 80s on room air.  He presented to ER and required Bipap. Work up consistent with acute RLE DVT.  Pertinent  Medical History  CAD, MVP with mod/severe MR, Testicular cancer s/p orchiectomy, Glaucoma, HLD  Significant Hospital Events:   5/16 Admit 5/18 Didn't need Bipap overnight.  Breathing better.  5/21 pneumomediastinum noted on chest radiograph, concern for GI bleeding, switched to heparin from lovenox 5/22 CTA Chest negative for PE. Pneumomediastinum and small left pneumothorax noted  Tests:  Echo 5/17 >> EF 40 to 45%, mild LVH, mild elevation in PASP, mild/mod MR, mild/mod TR Doppler legs b/l 5/17 >> acute DVT Rt popliteal, posterior tibial, peroneal and gastrocnemius veins  Interim History / Subjective:   No acute events overnight Wife is at bedside No bloody bowel movements He is down to 1L of O2 at rest   Objective   Blood pressure 115/72, pulse 87, temperature (!) 97.5 F (36.4 C), temperature source Oral, resp. rate 20, height '5\' 9"'$  (1.753 m), weight 66.3 kg, SpO2 97 %.        Intake/Output Summary (Last 24 hours) at 07/12/2021 0831 Last data filed at 07/12/2021 9371 Gross per 24 hour  Intake 1067.21 ml  Output 2651 ml  Net -1583.79 ml   Filed Weights   07/07/21 0500 07/08/21 0500 07/09/21 0500  Weight: 64.5 kg 65.4 kg 66.3 kg    Examination: General: adult male sitting up in chair in NAD  HEENT: MM pink/moist, anicteric, [place] O2  Neuro: Awake, alert, speech clear,  MAE. CV: s1s2 RRR, no m/r/g PULM: non-labored at rest, crackles throughout left posteriorly, no wheezing GI: soft, bsx4 active  Extremities: warm/dry, RLE >LLE (known DVT)   Skin: no rashes or lesions  Assessment & Plan:   Acute hypoxic respiratory failure. Pneumomediastinum Suspect combination of acute pulmonary edema and recurrence of post-viral BOOP and possible PE. He has small left pneumothorax and pneumomediastinum  - Continue prednisone taper with '40mg'$  daily and dropping down by '10mg'$  every 5 days over the next month -wean O2 for sats >90% - No PE noted on CTA Chest 5/22 - Barium esophogram without apparent leak - Pneumomediastinum likely a result from the recent viral pneumonia with damage to the lungs and rupture of small bleb.  - Will monitor pneumomediastinum with chest x-rays as needed if he develops worsening shortness of breath, chest pain/pressure, increase in O2 needs or chang in vitals. - He has supplemental O2 setup at home. He will need order for portable oxygen concentrator at discharge.  Acute Rt leg DVT in setting of testicular cancer and recent hospitalization with decreased mobility. - transition back to lovenox - Can transition to eliquis or xarelto in coming days  Concern for GI Bleeding - patient with bloody stools reported 5/21 - No further drop in H/H and no further bloody Bms noted - continue to monitor H/H   Coronary artery disease s/p CABG, mod/severe MR, Acute on chronic systolic CHF, HLD. Followed by Dr.  Rudean Haskell -appreciate Cardiology assistance with  patient care  -therapeutic anticoagulation + Plavix. Holding ASA. -continue coreg, plavix, farxiga, zetia, lasix, spironolactone   Staph epidermidis in blood culture. 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 -fluid restriction  -follow sodium trend   Glaucoma. -continue eye drops   Hx of testicular cancer. Followed by Dr.  Zola Button with oncology and Dr. Link Snuffer with [facility] -patient is scheduled for surveillance CT imaging the first week of June   PCCM will sign off at this time. Please schedule patient for follow up visit 7-10 days after his discharge. Please call with any further questions.   Best Practice (right click and "Reselect all SmartList Selections" daily)  Diet/type: Regular consistency (see orders) DVT prophylaxis: systemic heparin GI prophylaxis: N/A Lines: N/A Foley:  N/A Code Status:  full code Last date of multidisciplinary goals of care discussion: updated wife at bedside 5/19 on plan of care  Labs       Latest Ref Rng & Units 07/11/2021    3:47 AM 07/10/2021    8:37 AM 07/09/2021    3:30 AM  CMP  Glucose 70 - 99 mg/dL 114   90   121    BUN 8 - 23 mg/dL '21   21   27    '$ Creatinine 0.61 - 1.24 mg/dL 0.63   0.64   0.55    Sodium 135 - 145 mmol/L 131   131   130    Potassium 3.5 - 5.1 mmol/L 4.6   4.0   4.4    Chloride 98 - 111 mmol/L 98   95   93    CO2 22 - 32 mmol/L '29   29   30    '$ Calcium 8.9 - 10.3 mg/dL 8.3   8.0   8.4         Latest Ref Rng & Units 07/12/2021    4:02 AM 07/11/2021    8:23 PM 07/11/2021   11:38 AM  CBC  WBC 4.0 - 10.5 K/uL 18.1   14.1   17.6    Hemoglobin 13.0 - 17.0 g/dL 9.3   9.2   8.6    Hematocrit 39.0 - 52.0 % 29.3   28.9   26.5    Platelets 150 - 400 K/uL 471   497   424      ABG    Component Value Date/Time   PHART 7.46 (H) 07/04/2021 2220   PCO2ART 38 07/04/2021 2220   PO2ART 71 (L) 07/04/2021 2220   HCO3 27.0 07/04/2021 2220   TCO2 28 02/14/2021 1507   TCO2 27 02/14/2021 1507   ACIDBASEDEF 0.1 06/20/2021 0200   O2SAT 97 07/04/2021 2220    Signature:   Freda Jackson, MD [facility] Office: [phone]   See Amion for personal pager PCCM on call pager [phone] until 7pm. Please call Elink 7p-7a. [phone]

## 2021-07-12 NOTE — Progress Notes (Signed)
[facility] Gastroenterology Progress Note  Roberto Ponce 71 y.o. [birth year]   Subjective: Patient seen sitting in chair wife at bedside.  Patient notes no abdominal pain today.  Notes dark brown bowel movement this morning.  He is feeling well.   Objective: Vital signs in last 24 hours: Vitals:   07/11/21 2105 07/12/21 0451  BP: (!) 114/56 115/72  Pulse: 83 87  Resp: 20 20  Temp: (!) 97.5 F (36.4 C) (!) 97.5 F (36.4 C)  SpO2: 99% 97%    Physical Exam:  General:  Alert, cooperative, no distress, appears stated age  Head:  Normocephalic, without obvious abnormality, atraumatic  Eyes:  Anicteric sclera, EOM's intact  Lungs:   Clear to auscultation bilaterally, respirations unlabored  Heart:  Regular rate and rhythm, S1, S2 normal  Abdomen:   Soft, non-tender, bowel sounds active all four quadrants,  no masses,   Extremities: Extremities normal, atraumatic, no  edema  Pulses: 2+ and symmetric    Lab Results: Recent Labs    07/10/21 0837 07/11/21 0347  NA 131* 131*  K 4.0 4.6  CL 95* 98  CO2 29 29  GLUCOSE 90 114*  BUN 21 21  CREATININE 0.64 0.63  CALCIUM 8.0* 8.3*   No results for input(s): AST, ALT, ALKPHOS, BILITOT, PROT, ALBUMIN in the last 72 hours. Recent Labs    07/11/21 2023 07/12/21 0402  WBC 14.1* 18.1*  HGB 9.2* 9.3*  HCT 28.9* 29.3*  MCV 93.2 93.3  PLT 497* 471*   No results for input(s): LABPROT, INR in the last 72 hours.    Assessment Hematochezia  Significant mitral valve regurgitation with CHF Acute hypoxic respiratory failure pneumomediastinum Right leg DVT Anticoagulation SIADH   Patient with no further episodes of rectal bleeding.  Hemoglobin stable at 9.3.   At this time no significant bleeding.      Plan: Continue supportive care Patient stable from GI standpoint  Arvella Nigh Allysson Rinehimer PA-C 07/12/2021, 11:53 AM  Contact #  [phone]

## 2021-07-12 NOTE — Progress Notes (Signed)
Discharge instructions provided to and reviewed with patient and patient's wife.  Home use oxygen delivered to room.  PIV and cardiac monitoring removed.  Patient escorted to main entrance with belongings via wheelchair for transport home with wife.  Angie Fava, RN

## 2021-07-12 NOTE — TOC Transition Note (Addendum)
Transition of Care [facility]) - CM/SW Discharge Note   Patient Details  Name: Roberto Ponce MRN: [id number] Date of Birth: 21-Feb-1950  [facility] CM/SW Contact:  Leeroy Cha, RN Phone Number: 07/12/2021, 11:01 AM   Clinical Narrative:    Dcd to return home/ [employer] notified of need of travel o2 and wife needs update on the concentrator at home- Bethanne Ginger with [employer] notified.   Final next level of care: Home/Self Care Barriers to Discharge: No Barriers Identified   Patient Goals and CMS Choice Patient states their goals for this hospitalization and ongoing recovery are:: to go home CMS Medicare.gov Compare Post Acute Care list provided to:: Patient    Discharge Placement                       Discharge Plan and Services   Discharge Planning Services: CM Consult Post Acute Care Choice: Home Health                               Social Determinants of Health (SDOH) Interventions     Readmission Risk Interventions     View : No data to display.

## 2021-07-13 DIAGNOSIS — J9621 Acute and chronic respiratory failure with hypoxia: Secondary | ICD-10-CM | POA: Diagnosis not present

## 2021-07-13 DIAGNOSIS — I5022 Chronic systolic (congestive) heart failure: Secondary | ICD-10-CM | POA: Diagnosis not present

## 2021-07-13 LAB — CULTURE, BLOOD (ROUTINE X 2)
Culture: NO GROWTH
Culture: NO GROWTH
Special Requests: ADEQUATE

## 2021-07-19 DIAGNOSIS — E222 Syndrome of inappropriate secretion of antidiuretic hormone: Secondary | ICD-10-CM | POA: Diagnosis not present

## 2021-07-19 DIAGNOSIS — E871 Hypo-osmolality and hyponatremia: Secondary | ICD-10-CM | POA: Diagnosis not present

## 2021-07-19 DIAGNOSIS — I509 Heart failure, unspecified: Secondary | ICD-10-CM | POA: Diagnosis not present

## 2021-07-19 DIAGNOSIS — I82401 Acute embolism and thrombosis of unspecified deep veins of right lower extremity: Secondary | ICD-10-CM | POA: Diagnosis not present

## 2021-07-19 DIAGNOSIS — E878 Other disorders of electrolyte and fluid balance, not elsewhere classified: Secondary | ICD-10-CM | POA: Diagnosis not present

## 2021-07-19 DIAGNOSIS — Z09 Encounter for follow-up examination after completed treatment for conditions other than malignant neoplasm: Secondary | ICD-10-CM | POA: Diagnosis not present

## 2021-07-19 DIAGNOSIS — E875 Hyperkalemia: Secondary | ICD-10-CM | POA: Diagnosis not present

## 2021-07-20 DIAGNOSIS — C6211 Malignant neoplasm of descended right testis: Secondary | ICD-10-CM | POA: Diagnosis not present

## 2021-07-21 DIAGNOSIS — R634 Abnormal weight loss: Secondary | ICD-10-CM | POA: Diagnosis not present

## 2021-07-21 DIAGNOSIS — C6211 Malignant neoplasm of descended right testis: Secondary | ICD-10-CM | POA: Diagnosis not present

## 2021-07-21 DIAGNOSIS — C6290 Malignant neoplasm of unspecified testis, unspecified whether descended or undescended: Secondary | ICD-10-CM | POA: Diagnosis not present

## 2021-07-21 DIAGNOSIS — I340 Nonrheumatic mitral (valve) insufficiency: Secondary | ICD-10-CM | POA: Diagnosis not present

## 2021-07-21 DIAGNOSIS — J45909 Unspecified asthma, uncomplicated: Secondary | ICD-10-CM | POA: Diagnosis not present

## 2021-07-21 DIAGNOSIS — J189 Pneumonia, unspecified organism: Secondary | ICD-10-CM | POA: Diagnosis not present

## 2021-07-21 DIAGNOSIS — I422 Other hypertrophic cardiomyopathy: Secondary | ICD-10-CM | POA: Diagnosis not present

## 2021-07-21 DIAGNOSIS — I2510 Atherosclerotic heart disease of native coronary artery without angina pectoris: Secondary | ICD-10-CM | POA: Diagnosis not present

## 2021-07-21 DIAGNOSIS — M47817 Spondylosis without myelopathy or radiculopathy, lumbosacral region: Secondary | ICD-10-CM | POA: Diagnosis not present

## 2021-07-21 DIAGNOSIS — I25810 Atherosclerosis of coronary artery bypass graft(s) without angina pectoris: Secondary | ICD-10-CM | POA: Diagnosis not present

## 2021-07-21 DIAGNOSIS — I350 Nonrheumatic aortic (valve) stenosis: Secondary | ICD-10-CM | POA: Diagnosis not present

## 2021-07-21 DIAGNOSIS — J939 Pneumothorax, unspecified: Secondary | ICD-10-CM | POA: Diagnosis not present

## 2021-07-21 DIAGNOSIS — I252 Old myocardial infarction: Secondary | ICD-10-CM | POA: Diagnosis not present

## 2021-07-21 DIAGNOSIS — K449 Diaphragmatic hernia without obstruction or gangrene: Secondary | ICD-10-CM | POA: Diagnosis not present

## 2021-07-21 DIAGNOSIS — J479 Bronchiectasis, uncomplicated: Secondary | ICD-10-CM | POA: Diagnosis not present

## 2021-07-21 DIAGNOSIS — E871 Hypo-osmolality and hyponatremia: Secondary | ICD-10-CM | POA: Diagnosis not present

## 2021-07-21 DIAGNOSIS — I872 Venous insufficiency (chronic) (peripheral): Secondary | ICD-10-CM | POA: Diagnosis not present

## 2021-07-21 LAB — FUNGUS CULTURE WITH STAIN

## 2021-07-21 LAB — FUNGUS CULTURE RESULT

## 2021-07-21 LAB — FUNGAL ORGANISM REFLEX

## 2021-07-24 ENCOUNTER — Telehealth: Payer: Self-pay | Admitting: Internal Medicine

## 2021-07-24 NOTE — Telephone Encounter (Signed)
Patient returning call to [facility]

## 2021-07-24 NOTE — Telephone Encounter (Signed)
Left a message to call back.

## 2021-07-24 NOTE — Telephone Encounter (Signed)
Patient's wife called and wanted to ask the doctor and his blood sugar and if he needs to check it regularly while he is taking dapagliflozin propanediol (FARXIGA) 10 MG TABS tablet  Patient is also losing weight and they want to know if patient can drink ENSURE

## 2021-07-25 ENCOUNTER — Ambulatory Visit (INDEPENDENT_AMBULATORY_CARE_PROVIDER_SITE_OTHER): Payer: PPO

## 2021-07-25 ENCOUNTER — Ambulatory Visit: Payer: PPO | Admitting: Pulmonary Disease

## 2021-07-25 VITALS — BP 110/78 | HR 74 | Ht 69.0 in | Wt 137.2 lb

## 2021-07-25 DIAGNOSIS — J982 Interstitial emphysema: Secondary | ICD-10-CM

## 2021-07-25 DIAGNOSIS — J939 Pneumothorax, unspecified: Secondary | ICD-10-CM | POA: Diagnosis not present

## 2021-07-25 DIAGNOSIS — J8489 Other specified interstitial pulmonary diseases: Secondary | ICD-10-CM

## 2021-07-25 DIAGNOSIS — J189 Pneumonia, unspecified organism: Secondary | ICD-10-CM | POA: Diagnosis not present

## 2021-07-25 NOTE — Patient Instructions (Addendum)
Continue on steroid taper as planned for now  We will check a chest x-ray and consider going back up higher on the steroid dose  Follow up in 1 month

## 2021-07-25 NOTE — Telephone Encounter (Signed)
Called pt advised that regular checking of blood sugar is not warranted.  Educated pt on the s/s of low blood sugar.  Pt does not have a glucometer at home.  Advised pt if has these symptoms BS check may be needed.  Also advised on the importance of good peri care while taking [place].  Advised pt to f/u with PCP in regards to Ensure.  Pt voiced no further concerns.

## 2021-07-25 NOTE — Progress Notes (Signed)
Synopsis: Referred in June 2023 for hospital follow up  Subjective:   PATIENT ID: Roberto Ponce GENDER: male DOB: 1950-08-02, MRN: [id number]  HPI  Chief Complaint  Patient presents with   Hospitalization Follow-up    HFU for resp. Failure. States he has been doing well at home, trying to get his strength back. Finished prednisone taper today.    Roberto Ponce is a 71 year old male, never smoker with coronary artery disease, moderate-severe mitral regurgitation and testicular cancer s/p orchiectomy who is referred to pulmonary clinic for hospital follow up for acute hypoxemic respiratory failure due to viral pneumonia with post-viral BOOP and pneumomediastinum.   Continue prednisone taper with '40mg'$  daily and dropping down by '10mg'$  every 5 days over the next month  Working with PT at home. He continues on supplemental oxygen.  Not dropping as significant  No appetite. Still eating ok. Drinking an ensure.  No bloody or dark bowel movements. Has follow up with urology on Thursday, had CT abdomen last week.   Intermittent cough. Not productive.  No chest pains On going dyspnea  Past Medical History:  Diagnosis Date   Asthma    Coronary artery disease    Glaucoma    History of gallstones    Hyperlipidemia    Hypotension due to medication 06/22/2021   LVH (left ventricular hypertrophy)    MI (myocardial infarction) ([place])    MI, old    Mild aortic sclerosis    Mitral regurgitation    Mixed dyslipidemia    Shock ([place]) 06/22/2021   Venous stasis      Family History  Problem Relation Age of Onset   Heart attack Father    Stroke Father      Social History   Socioeconomic History   Marital status: Married    Spouse name: Not on file   Number of children: Not on file   Years of education: Not on file   Highest education level: Not on file  Occupational History   Not on file  Tobacco Use   Smoking status: Never   Smokeless tobacco: Never  Vaping Use   Vaping  Use: Never used  Substance and Sexual Activity   Alcohol use: No   Drug use: No   Sexual activity: Not on file  Other Topics Concern   Not on file  Social History Narrative   Not on file   Social Determinants of Health   Financial Resource Strain: Not on file  Food Insecurity: Not on file  Transportation Needs: Not on file  Physical Activity: Not on file  Stress: Not on file  Social Connections: Not on file  Intimate Partner Violence: Not on file     No Known Allergies   Outpatient Medications Prior to Visit  Medication Sig Dispense Refill   acetaminophen (TYLENOL) 500 MG tablet Take 1,000 mg by mouth every 6 (six) hours as needed for moderate pain.     apixaban (ELIQUIS) 5 MG TABS tablet Take 1 tablet (5 mg total) by mouth 2 (two) times daily. 60 tablet 2   brimonidine (ALPHAGAN) 0.2 % ophthalmic solution Place 1 drop into the left eye 2 (two) times daily.     carvedilol (COREG) 3.125 MG tablet Take 1 tablet (3.125 mg total) by mouth 2 (two) times daily with a meal. 60 tablet 2   clopidogrel (PLAVIX) 75 MG tablet Take 1 tablet (75 mg total) by mouth daily with breakfast. 90 tablet 3   dapagliflozin propanediol (FARXIGA)  10 MG TABS tablet Take 1 tablet (10 mg total) by mouth daily. 30 tablet 2   dorzolamide-timolol (COSOPT) 22.3-6.8 MG/ML ophthalmic solution Place 1 drop into both eyes 2 (two) times daily.     ezetimibe (ZETIA) 10 MG tablet TAKE 1 TABLET(10 MG) BY MOUTH DAILY (Patient taking differently: Take 10 mg by mouth daily.) 90 tablet 1   feeding supplement (ENSURE ENLIVE / ENSURE PLUS) LIQD Take 237 mLs by mouth 2 (two) times daily between meals. 237 mL 12   latanoprost (XALATAN) 0.005 % ophthalmic solution Place 1 drop into both eyes at bedtime.      nitroGLYCERIN (NITROSTAT) 0.4 MG SL tablet Place 1 tablet (0.4 mg total) under the tongue every 5 (five) minutes as needed for chest pain. 25 tablet 3   pantoprazole (PROTONIX) 40 MG tablet Take 1 tablet (40 mg total) by  mouth 2 (two) times daily. 60 tablet 1   SODIUM FLUORIDE 5000 PPM 1.1 % PSTE Apply 1 application. topically in the morning and at bedtime.     spironolactone (ALDACTONE) 25 MG tablet Take 0.5 tablets (12.5 mg total) by mouth daily. 30 tablet 2   triamcinolone cream (KENALOG) 0.1 % Apply 1 application topically 2 (two) times daily as needed (eczema).     [START ON 08/02/2021] furosemide (LASIX) 40 MG tablet Take 1 tablet (40 mg total) by mouth daily. (Patient not taking: Reported on 07/25/2021) 60 tablet 3   polyethylene glycol powder (MIRALAX) 17 GM/SCOOP powder Take 17 g by mouth 2 (two) times daily as needed for moderate constipation. 255 g 0   predniSONE (DELTASONE) 10 MG tablet Take 4 tablets (40 mg total) by mouth daily with breakfast for 2 days, THEN 3 tablets (30 mg total) daily with breakfast for 3 days, THEN 2 tablets (20 mg total) daily with breakfast for 3 days, THEN 1 tablet (10 mg total) daily with breakfast for 3 days, THEN 0.5 tablets (5 mg total) daily with breakfast for 3 days. 27.5 tablet 0   No facility-administered medications prior to visit.   Review of Systems  Constitutional:  Negative for chills, fever, malaise/fatigue and weight loss.  HENT:  Negative for congestion, sinus pain and sore throat.   Eyes: Negative.   Respiratory:  Negative for cough, hemoptysis, sputum production, shortness of breath and wheezing.   Cardiovascular:  Negative for chest pain, palpitations, orthopnea, claudication and leg swelling.  Gastrointestinal:  Negative for abdominal pain, heartburn, nausea and vomiting.  Genitourinary: Negative.   Musculoskeletal:  Negative for joint pain and myalgias.  Skin:  Negative for rash.  Neurological:  Negative for weakness.  Endo/Heme/Allergies: Negative.   Psychiatric/Behavioral: Negative.     Objective:   Vitals:   07/25/21 1503  BP: 110/78  Pulse: 74  SpO2: 94%  Weight: 137 lb 3.2 oz (62.2 kg)  Height: '5\' 9"'$  (1.753 m)   Physical  Exam Constitutional:      General: He is not in acute distress. HENT:     Head: Normocephalic and atraumatic.  Eyes:     Extraocular Movements: Extraocular movements intact.     Conjunctiva/sclera: Conjunctivae normal.     Pupils: Pupils are equal, round, and reactive to light.  Cardiovascular:     Rate and Rhythm: Normal rate and regular rhythm.     Pulses: Normal pulses.     Heart sounds: Normal heart sounds. No murmur heard. Pulmonary:     Breath sounds: Decreased air movement present. Decreased breath sounds and rales present.  Abdominal:  General: Bowel sounds are normal.     Palpations: Abdomen is soft.  Musculoskeletal:     Right lower leg: No edema.     Left lower leg: No edema.  Lymphadenopathy:     Cervical: No cervical adenopathy.  Skin:    General: Skin is warm and dry.  Neurological:     General: No focal deficit present.     Mental Status: He is lethargic.  Psychiatric:        Mood and Affect: Mood normal.        Behavior: Behavior normal.        Thought Content: Thought content normal.        Judgment: Judgment normal.   CBC    Component Value Date/Time   WBC 18.1 (H) 07/12/2021 0402   RBC 3.14 (L) 07/12/2021 0402   HGB 9.3 (L) 07/12/2021 0402   HGB 14.0 01/26/2021 0859   HCT 29.3 (L) 07/12/2021 0402   HCT 41.2 01/26/2021 0859   PLT 471 (H) 07/12/2021 0402   PLT 186 01/26/2021 0859   MCV 93.3 07/12/2021 0402   MCV 89 01/26/2021 0859   MCH 29.6 07/12/2021 0402   MCHC 31.7 07/12/2021 0402   RDW 17.8 (H) 07/12/2021 0402   RDW 14.1 01/26/2021 0859   LYMPHSABS 1.4 07/04/2021 2210   MONOABS 0.6 07/04/2021 2210   EOSABS 0.5 07/04/2021 2210   BASOSABS 0.0 07/04/2021 2210      Latest Ref Rng & Units 07/11/2021    3:47 AM 07/10/2021    8:37 AM 07/09/2021    3:30 AM  BMP  Glucose 70 - 99 mg/dL 114   90   121    BUN 8 - 23 mg/dL '21   21   27    '$ Creatinine 0.61 - 1.24 mg/dL 0.63   0.64   0.55    Sodium 135 - 145 mmol/L 131   131   130    Potassium  3.5 - 5.1 mmol/L 4.6   4.0   4.4    Chloride 98 - 111 mmol/L 98   95   93    CO2 22 - 32 mmol/L '29   29   30    '$ Calcium 8.9 - 10.3 mg/dL 8.3   8.0   8.4     Chest imaging: CXR 07/25/21 1. Increasing left basilar airspace disease. 2. Stable small left apical pneumothorax in left pneumomediastinum. 3. Stable chronic interstitial changes.  CXR 07/11/21 Stable cardiomediastinal contours. Pneumomediastinum is again noted bilaterally. Gas is noted tracking into the lower neck as before. Unchanged asymmetric opacification throughout the left lung. Mild peripheral opacities within the right lower lung is also unchanged. No signs of pneumothorax. Trace left pleural fluid. Unchanged.  CTA Chest 07/10/21 1. Negative for pulmonary embolism to the segmental level. 2. Extensive pneumomediastinum tracking into the low neck and involving the LEFT greater than RIGHT mediastinal border and extending along the esophagus and underneath the crus of the LEFT hemidiaphragm but not extending into the abdomen. 3. Etiology for above process is uncertain. Perhaps cough leading to barotrauma. Esophageal source is another differential consideration. No mediastinal fluid or focal thickening of the esophagus. This does not allow for exclusion of esophageal source but is perhaps reassuring 4. Small associated and partially loculated LEFT-sided pneumothorax at the apex, anteriorly and with a small loculated component in the major fissure. 5. Small LEFT-sided pleural effusion slightly diminished with resolution of RIGHT-sided effusion. 6. Improving airspace disease still with considerable ground-glass and  septal thickening in the LEFT chest and worse at the LEFT lung base. 7. Aortic atherosclerosis, coronary artery disease and changes of CABG.  PFT:     View : No data to display.          Labs:  Path:  Echo 07/05/21: EF 40-45%. RV systolic function and size is normal. Mildly elevated PASP.  Heart  Catheterization:  Assessment & Plan:   Pneumomediastinum ([place])  Bronchiolitis obliterans with organizing pneumonia ([place])  Discussion: Roberto Ponce is a 71 year old male, never smoker with coronary artery disease, moderate-severe mitral regurgitation and testicular cancer s/p orchiectomy who is referred to pulmonary clinic for hospital follow up for acute hypoxemic respiratory failure due to viral pneumonia with post-viral BOOP and pneumomediastinum.   Patient has tapered down on steroids fairly rapidly to allow for healing of the pneumothorax and pneumomediastinum. These have improved based on chest radiograph today. He has increasing left basilar airspace disease concerning for ongoing organizing pneumonia.   We will increase his steroids to '30mg'$  daily and he is to take bactrim prophylaxis 3 days per week until follow up visit in 1 month.   Freda Jackson, MD [facility] Office: [phone]    Current Outpatient Medications:    acetaminophen (TYLENOL) 500 MG tablet, Take 1,000 mg by mouth every 6 (six) hours as needed for moderate pain., Disp: , Rfl:    apixaban (ELIQUIS) 5 MG TABS tablet, Take 1 tablet (5 mg total) by mouth 2 (two) times daily., Disp: 60 tablet, Rfl: 2   brimonidine (ALPHAGAN) 0.2 % ophthalmic solution, Place 1 drop into the left eye 2 (two) times daily., Disp: , Rfl:    carvedilol (COREG) 3.125 MG tablet, Take 1 tablet (3.125 mg total) by mouth 2 (two) times daily with a meal., Disp: 60 tablet, Rfl: 2   clopidogrel (PLAVIX) 75 MG tablet, Take 1 tablet (75 mg total) by mouth daily with breakfast., Disp: 90 tablet, Rfl: 3   dapagliflozin propanediol (FARXIGA) 10 MG TABS tablet, Take 1 tablet (10 mg total) by mouth daily., Disp: 30 tablet, Rfl: 2   dorzolamide-timolol (COSOPT) 22.3-6.8 MG/ML ophthalmic solution, Place 1 drop into both eyes 2 (two) times daily., Disp: , Rfl:    ezetimibe (ZETIA) 10 MG tablet, TAKE 1 TABLET(10 MG) BY MOUTH  DAILY (Patient taking differently: Take 10 mg by mouth daily.), Disp: 90 tablet, Rfl: 1   feeding supplement (ENSURE ENLIVE / ENSURE PLUS) LIQD, Take 237 mLs by mouth 2 (two) times daily between meals., Disp: 237 mL, Rfl: 12   latanoprost (XALATAN) 0.005 % ophthalmic solution, Place 1 drop into both eyes at bedtime. , Disp: , Rfl:    nitroGLYCERIN (NITROSTAT) 0.4 MG SL tablet, Place 1 tablet (0.4 mg total) under the tongue every 5 (five) minutes as needed for chest pain., Disp: 25 tablet, Rfl: 3   pantoprazole (PROTONIX) 40 MG tablet, Take 1 tablet (40 mg total) by mouth 2 (two) times daily., Disp: 60 tablet, Rfl: 1   SODIUM FLUORIDE 5000 PPM 1.1 % PSTE, Apply 1 application. topically in the morning and at bedtime., Disp: , Rfl:    spironolactone (ALDACTONE) 25 MG tablet, Take 0.5 tablets (12.5 mg total) by mouth daily., Disp: 30 tablet, Rfl: 2   triamcinolone cream (KENALOG) 0.1 %, Apply 1 application topically 2 (two) times daily as needed (eczema)., Disp: , Rfl:    [START ON 08/02/2021] furosemide (LASIX) 40 MG tablet, Take 1 tablet (40 mg total) by mouth daily. (Patient  not taking: Reported on 07/25/2021), Disp: 60 tablet, Rfl: 3

## 2021-07-27 ENCOUNTER — Encounter: Payer: Self-pay | Admitting: Pulmonary Disease

## 2021-07-27 ENCOUNTER — Telehealth: Payer: Self-pay | Admitting: Pulmonary Disease

## 2021-07-27 DIAGNOSIS — C6211 Malignant neoplasm of descended right testis: Secondary | ICD-10-CM | POA: Diagnosis not present

## 2021-07-27 DIAGNOSIS — D414 Neoplasm of uncertain behavior of bladder: Secondary | ICD-10-CM | POA: Diagnosis not present

## 2021-07-27 MED ORDER — PREDNISONE 10 MG PO TABS: 30.0000 mg | ORAL_TABLET | Freq: Every day | ORAL | 1 refills | Status: AC

## 2021-07-27 MED ORDER — SULFAMETHOXAZOLE-TRIMETHOPRIM 800-160 MG PO TABS: 1.0000 | ORAL_TABLET | ORAL | 1 refills | Status: DC

## 2021-07-27 NOTE — Telephone Encounter (Signed)
I spoke with patient and his wife this morning about the chest x-ray results.   We will be going back up on his steroids for the concern of organizing pneumonia to '30mg'$  daily with bactrim DS prophylaxis 1 tab 3 days per week.   He reports episodes of wheezing and oxygen desaturations that has occurred a couple times over the past week. I let them know I am concerned about his lungs and would not be surprised if he needed to go back to the hospital for evaluation.   Freda Jackson, MD [facility] Office: [phone]

## 2021-07-27 NOTE — Telephone Encounter (Signed)
Received call report from [place] with [facility] on patient's cxr done on 07/25/21.    IMPRESSION: 1. Increasing left basilar airspace disease. 2. Stable small left apical pneumothorax in left pneumomediastinum. 3. Stable chronic interstitial changes.  Stated to Roberto Ponce that Roberto Ponce was aware of the results. Nothing further needed.

## 2021-07-28 NOTE — Telephone Encounter (Signed)
Called patient this afternoon and he is wanting to know after all he heard from Dr Erin Fulling yesterday and his results he is wanting to know if it is still safe for him to work out.   Please advise sir

## 2021-08-01 DIAGNOSIS — E875 Hyperkalemia: Secondary | ICD-10-CM | POA: Diagnosis not present

## 2021-08-01 DIAGNOSIS — E871 Hypo-osmolality and hyponatremia: Secondary | ICD-10-CM | POA: Diagnosis not present

## 2021-08-01 DIAGNOSIS — E878 Other disorders of electrolyte and fluid balance, not elsewhere classified: Secondary | ICD-10-CM | POA: Diagnosis not present

## 2021-08-02 ENCOUNTER — Telehealth: Payer: Self-pay | Admitting: Internal Medicine

## 2021-08-02 NOTE — Telephone Encounter (Signed)
Patient has been holding lasix for about 3 week now. Looks like d/c note said to hold 1 week, but d/c paperwork said 1 month. He did have AKI in hospital, but resolved before d/c.  I think since he has been off of diuretic for so long, his need for diuretic and fluid status needs to reassessed before restarting.

## 2021-08-02 NOTE — Telephone Encounter (Signed)
Patient's wife called and wanted to know if patient should take 2 lasix or 1 lasix.Marland Kitchen...was in hospital and was told to stop taking lasix and to start back today on 6/14. Wants to know also if it should be taken day or night

## 2021-08-02 NOTE — Telephone Encounter (Signed)
Called spouse advised of pharmacist recommendation to hold off on restarting diuretic until can be evaluated.  Spouse reports weight is stable, not having productive cough, small amount of swelling to toes.  Advised spouse to call office if wt increases 2-3lbs overnight, notices productive cough, or sob.  Pt currently on 2L O2 continuously.  Scheduled pt to see Dr.Nahser on Monday 08-07-21 at 11 am.  (Last OV with Dr. Acie Fredrickson 07-22-20)

## 2021-08-02 NOTE — Telephone Encounter (Signed)
Called pt spouse advised per Discharge Summary from 07-12-21 is to start furosemide 40 mg PO QD.  Spouse questioning why medication was held.  Wonders if it has anything to do with pt steroid and antibiotic therapy.  Pt is currently taking prednisone and antibiotic.  Will route to pharmacist to review if prednisone and antibiotic interact with furosemide.

## 2021-08-06 ENCOUNTER — Encounter: Payer: Self-pay | Admitting: Cardiovascular Disease

## 2021-08-06 NOTE — Progress Notes (Unsigned)
Cardiology Office Note   Date:  08/07/2021   ID:  Roberto Ponce, DOB [birth year], MRN [id number]  PCP:  Kristen Loader, FNP  Cardiologist:   Mertie Moores, MD   Chief Complaint  Patient presents with   Coronary Artery Disease        Mitral Valve Prolapse   Hyperlipidemia   1. Coronary artery disease, CABG ( LIMA to LAD, RIMA to RCA)  - 25 ( age 71) , old Inf. MI prior to CABG.   2. Hyperlipidemia  Previous notes:   Roberto Ponce is a 71 yo with hx of CAD, CABG.  He was previously a patient of Dr. Susa Simmonds.  He has not had any angina.  Stays on a good diet.   He is now retired .      Jun 21, 2014:    Roberto Ponce is a 71 y.o. male who presents for  His CAD. Stays very active.  Exercises regularly .  Jul 05, 2015:  Roberto Ponce is seen today for follow up of his CAD, had CABG in 1988 Roberto Heater, MD)  Very active.  Works out on the treadmill regularly . Diet is generally good   No CP or dyspnea.   Jul 09, 2016:  Roberto Ponce is seen today for follow Up of his coronary artery disease. Coronary artery bypass grafting at age 73. Is now retired.  Keeps busy with his 6 grandchildren.   Still exercising regularly , works on the treadmill and "[employer]" Labs have been checked by Hulan Fess, MD   Jul 09, 2017:  Roberto Ponce is seen today for follow up of his CAD . Enjoying retirement . BP is a bit elevated today .   Home readings have also been elevated. Has been eating more salt than he should  Going to the gym - treadmill 4 days a week.    Jul 13, 2019: Roberto Ponce is seen today for follow-up of his coronary artery disease. Had shingles last month.  Feeling better now.  Is back exercising .  No CP or dyspnea Labs were reviewed..  Chol levels look ok . LDL is 83.   On rosuvastatin 40 and zetia 10 mg a day   July 22, 2020: Roberto Ponce is seen today for follow up of his CAD. S/p CABG  Had covid in January. Is gradually getting stronger .  No CP or angina    August 07, 2021 Roberto Ponce is seen today for follow up of his CAD, CABG Has lost lots of weight .   30 lbs this year .  Appears chronically ill ,  look Was in the hospital in May with pneumonia and CHF , BOOP and respiratory failure.  Was on the vent for a day or so .  Has severe MR  Is seeing Burt Knack  soon for consideration of mitra clip   Was readmitted in [facility] May for hypoxemia  BOOP  Has been on steroids    Also was found to have a R DVT in his leg   Has started rehab.  Appt with Burt Knack on July 5 to discuss mitracliip      Past Medical History:  Diagnosis Date   Asthma    Coronary artery disease    Glaucoma    History of gallstones    Hyperlipidemia    Hypotension due to medication 06/22/2021   LVH (left ventricular hypertrophy)    MI (myocardial infarction) (HCC)    MI, old    Mild aortic  sclerosis    Mitral regurgitation    Mixed dyslipidemia    Shock ([place]) 06/22/2021   Venous stasis     Past Surgical History:  Procedure Laterality Date   CARDIAC CATHETERIZATION  09/09/1986   EF 56%   CARDIOVASCULAR STRESS TEST  12/16/2007   EF 54% NO ISCHEMIA   CHOLECYSTECTOMY     CORONARY ARTERY BYPASS GRAFT     LIMA TO THE LAD AND RIMA TO RIGHT CORONARY ARTERY   CORONARY STENT INTERVENTION N/A 02/14/2021   Procedure: CORONARY STENT INTERVENTION;  Surgeon: Sherren Mocha, MD;  Location: [place] [facility];  Service: Cardiovascular;  Laterality: N/A;   ORCHIECTOMY Right 04/19/2021   Procedure: RIGHT RADICAL INGUINAL ORCHIECTOMY;  Surgeon: Lucas Mallow, MD;  Location: [facility];  Service: Urology;  Laterality: Right;  1 HR FOR CASE   RIGHT/LEFT HEART CATH AND CORONARY/GRAFT ANGIOGRAPHY N/A 02/14/2021   Procedure: RIGHT/LEFT HEART CATH AND CORONARY/GRAFT ANGIOGRAPHY;  Surgeon: Sherren Mocha, MD;  Location: [place] [facility];  Service: Cardiovascular;  Laterality: N/A;   TEE WITHOUT CARDIOVERSION N/A 09/02/2020   Procedure: TRANSESOPHAGEAL ECHOCARDIOGRAM (TEE);  Surgeon: Werner Lean, MD;  Location[facility];  Service: Cardiovascular;  Laterality: N/A;   US ECHOCARDIOGRAPHY  12/12/2007   EF 55-60%   VASECTOMY  1988     Current Outpatient Medications  Medication Sig Dispense Refill   acetaminophen (TYLENOL) 500 MG tablet Take 1,000 mg by mouth every 6 (six) hours as needed for moderate pain.     apixaban (ELIQUIS) 5 MG TABS tablet Take 1 tablet (5 mg total) by mouth 2 (two) times daily. 60 tablet 2   brimonidine (ALPHAGAN) 0.2 % ophthalmic solution Place 1 drop into the left eye 2 (two) times daily.     carvedilol (COREG) 3.125 MG tablet Take 1 tablet (3.125 mg total) by mouth 2 (two) times daily with a meal. 60 tablet 2   clopidogrel (PLAVIX) 75 MG tablet Take 1 tablet (75 mg total) by mouth daily with breakfast. 90 tablet 3   dapagliflozin propanediol (FARXIGA) 10 MG TABS tablet Take 1 tablet (10 mg total) by mouth daily. 30 tablet 2   dorzolamide-timolol (COSOPT) 22.3-6.8 MG/ML ophthalmic solution Place 1 drop into both eyes 2 (two) times daily.     ezetimibe (ZETIA) 10 MG tablet TAKE 1 TABLET(10 MG) BY MOUTH DAILY 90 tablet 1   feeding supplement (ENSURE ENLIVE / ENSURE PLUS) LIQD Take 237 mLs by mouth 2 (two) times daily between meals. 237 mL 12   furosemide (LASIX) 40 MG tablet Take 1 tablet (40 mg total) by mouth daily. 60 tablet 3   furosemide (LASIX) 40 MG tablet Take 1 tablet (40 mg total) by mouth daily. 90 tablet 3   latanoprost (XALATAN) 0.005 % ophthalmic solution Place 1 drop into both eyes at bedtime.      nitroGLYCERIN (NITROSTAT) 0.4 MG SL tablet Place 1 tablet (0.4 mg total) under the tongue every 5 (five) minutes as needed for chest pain. 25 tablet 3   predniSONE (DELTASONE) 10 MG tablet Take 3 tablets (30 mg total) by mouth daily with breakfast. 90 tablet 1   SODIUM FLUORIDE 5000 PPM 1.1 % PSTE Apply 1 application. topically in the morning and at bedtime.     spironolactone (ALDACTONE) 25 MG tablet Take 0.5 tablets (12.5 mg total) by mouth  daily. 30 tablet 2   sulfamethoxazole-trimethoprim (BACTRIM DS) 800-160 MG tablet Take 1 tablet by mouth 3 (three) times a week. 24 tablet  1   triamcinolone cream (KENALOG) 0.1 % Apply 1 application topically 2 (two) times daily as needed (eczema).     No current facility-administered medications for this visit.    Allergies:   Patient has no known allergies.    Social History:  The patient  reports that he has never smoked. He has never used smokeless tobacco. He reports that he does not drink alcohol and does not use drugs.   Family History:  The patient's family history includes Heart attack in his father; Stroke in his father.    ROS:   Noted in current history, otherwise review of systems is negative.   Physical Exam: Blood pressure 116/68, pulse 92, height '5\' 9"'$  (1.753 m), weight 135 lb 3.2 oz (61.3 kg), SpO2 97 %.  GEN:  chronically ill appearing man  HEENT: Normal NECK: No JVD; No carotid bruits LYMPHATICS: No lymphadenopathy CARDIAC: RRR  , + systolic murmur  RESPIRATORY:  Clear to auscultation without rales, wheezing or rhonchi  ABDOMEN: Soft, non-tender, non-distended MUSCULOSKELETAL:  No edema; No deformity  SKIN: Warm and dry NEUROLOGIC:  Alert and oriented x 3   EKG:      Recent Labs: 07/04/2021: ALT 73; B Natriuretic Peptide 89.9 07/08/2021: Magnesium 2.3 07/11/2021: BUN 21; Creatinine, Ser 0.63; Potassium 4.6; Sodium 131 07/12/2021: Hemoglobin 9.3; Platelets 471    Lipid Panel    Component Value Date/Time   CHOL 136 06/16/2020 0954   TRIG 86 06/21/2021 0358   HDL 34 (L) 06/16/2020 0954   CHOLHDL 4.0 06/16/2020 0954   LDLCALC 86 06/16/2020 0954      Wt Readings from Last 3 Encounters:  08/07/21 135 lb 3.2 oz (61.3 kg)  07/25/21 137 lb 3.2 oz (62.2 kg)  07/09/21 146 lb 4.1 oz (66.3 kg)      Other studies Reviewed: Additional studies/ records that were reviewed today include: . Review of the above records demonstrates:    ASSESSMENT AND  PLAN:  1. Coronary artery disease, CABG ( LIMA to LAD, RIMA to RCA)  - 67 ( age 32) ,  .  No angina     2. Hyperlipidemia -    3.  MVP :  Has a late peaking systolic murmur Has severe MR by echo. EF is 40-45%. Will restart lasix 40 mg a dy  He will see Dr. Burt Knack in a month. Overall, his condition is generally declining .     Current medicines are reviewed at length with the patient today.  The patient does not have concerns regarding medicines.  The following changes have been made:  no change  Labs/ tests ordered today include:   Orders Placed This Encounter  Procedures   Basic metabolic panel     Disposition:        Mertie Moores, MD  08/07/2021 2:26 PM    [address], [place], [place]  [address] Phone: [phone]; Fax: [phone]

## 2021-08-07 ENCOUNTER — Encounter: Payer: Self-pay | Admitting: Cardiovascular Disease

## 2021-08-07 ENCOUNTER — Ambulatory Visit (INDEPENDENT_AMBULATORY_CARE_PROVIDER_SITE_OTHER): Payer: PPO | Admitting: Cardiovascular Disease

## 2021-08-07 VITALS — BP 116/68 | HR 92 | Ht 69.0 in | Wt 135.2 lb

## 2021-08-07 DIAGNOSIS — Z79899 Other long term (current) drug therapy: Secondary | ICD-10-CM | POA: Diagnosis not present

## 2021-08-07 DIAGNOSIS — I340 Nonrheumatic mitral (valve) insufficiency: Secondary | ICD-10-CM | POA: Diagnosis not present

## 2021-08-07 DIAGNOSIS — E782 Mixed hyperlipidemia: Secondary | ICD-10-CM | POA: Diagnosis not present

## 2021-08-07 DIAGNOSIS — I25810 Atherosclerosis of coronary artery bypass graft(s) without angina pectoris: Secondary | ICD-10-CM | POA: Diagnosis not present

## 2021-08-07 MED ORDER — FUROSEMIDE 40 MG PO TABS: 40.0000 mg | ORAL_TABLET | Freq: Every day | ORAL | 3 refills | Status: DC

## 2021-08-07 NOTE — Telephone Encounter (Signed)
Patient can be physically active as tolerated.  Thanks, JD

## 2021-08-07 NOTE — Telephone Encounter (Signed)
Spoke with the pt and notified of response per Dr Erin Fulling. He verbalized understanding. Nothing further needed.

## 2021-08-07 NOTE — Patient Instructions (Signed)
Medication Instructions:  START Furosemide 18m daily *If you need a refill on your cardiac medications before your next appointment, please call your pharmacy*   Lab Work: MET in 2-3 weeks If you have labs (blood work) drawn today and your tests are completely normal, you will receive your results only by: [place](if you have MyChart) OR A paper copy in the mail If you have any lab test that is abnormal or we need to change your treatment, we will call you to review the results.   Testing/Procedures: NONE   Follow-Up: [facility] and your health needs are our priority.  As part of our continuing mission to provide you with exceptional heart care, we have created designated Provider Care Teams.  These Care Teams include your primary Cardiologist (physician) and Advanced Practice Providers (APPs -  Physician Assistants and Nurse Practitioners) who all work together to provide you with the care you need, when you need it.  Your next appointment:   3 month(s)  The format for your next appointment:   In Person  Provider:   MWerner Lean MD {  Important Information About Sugar

## 2021-08-08 ENCOUNTER — Telehealth: Payer: Self-pay

## 2021-08-08 NOTE — Telephone Encounter (Signed)
Per Dr. Burt Knack, cancelled echocardiogram on 08/23/21 as he had one recently. The patient will keep his appointment with Dr. Burt Knack on 08/23/2021 at 1020. Roberto Ponce was grateful for call and agrees with plan.

## 2021-08-10 DIAGNOSIS — H401133 Primary open-angle glaucoma, bilateral, severe stage: Secondary | ICD-10-CM | POA: Diagnosis not present

## 2021-08-10 LAB — ACID FAST CULTURE WITH REFLEXED SENSITIVITIES (MYCOBACTERIA): Acid Fast Culture: NEGATIVE

## 2021-08-13 DIAGNOSIS — I5022 Chronic systolic (congestive) heart failure: Secondary | ICD-10-CM | POA: Diagnosis not present

## 2021-08-13 DIAGNOSIS — J9621 Acute and chronic respiratory failure with hypoxia: Secondary | ICD-10-CM | POA: Diagnosis not present

## 2021-08-14 ENCOUNTER — Other Ambulatory Visit (HOSPITAL_COMMUNITY): Payer: PPO

## 2021-08-23 ENCOUNTER — Telehealth: Payer: Self-pay | Admitting: Pulmonary Disease

## 2021-08-23 ENCOUNTER — Encounter: Payer: Self-pay | Admitting: Cardiovascular Disease

## 2021-08-23 ENCOUNTER — Other Ambulatory Visit (HOSPITAL_COMMUNITY): Payer: PPO

## 2021-08-23 ENCOUNTER — Other Ambulatory Visit: Payer: PPO | Admitting: *Deleted

## 2021-08-23 ENCOUNTER — Ambulatory Visit (INDEPENDENT_AMBULATORY_CARE_PROVIDER_SITE_OTHER): Payer: PPO | Admitting: Cardiovascular Disease

## 2021-08-23 VITALS — BP 90/64 | HR 74 | Ht 69.0 in | Wt 134.0 lb

## 2021-08-23 DIAGNOSIS — E782 Mixed hyperlipidemia: Secondary | ICD-10-CM

## 2021-08-23 DIAGNOSIS — I340 Nonrheumatic mitral (valve) insufficiency: Secondary | ICD-10-CM | POA: Diagnosis not present

## 2021-08-23 DIAGNOSIS — I25810 Atherosclerosis of coronary artery bypass graft(s) without angina pectoris: Secondary | ICD-10-CM

## 2021-08-23 DIAGNOSIS — Z79899 Other long term (current) drug therapy: Secondary | ICD-10-CM | POA: Diagnosis not present

## 2021-08-23 MED ORDER — ASPIRIN 81 MG PO TBEC: 81.0000 mg | DELAYED_RELEASE_TABLET | Freq: Every day | ORAL | 3 refills | Status: DC

## 2021-08-23 NOTE — Telephone Encounter (Signed)
Called and spoke with patient and his wife [name]. They wanted to know if he needed to continue the prednisone. I advised them that per the last OV, he needs to remain on the prednisone until his next visit (09/01/21). She confirmed that he has a refill on the prednisone.   Nothing further needed at time of call.

## 2021-08-23 NOTE — Patient Instructions (Signed)
Medication Instructions:  STOP Clopidogrel (Plavix) START/Continue Aspirin '81mg'$  daily *If you need a refill on your cardiac medications before your next appointment, please call your pharmacy*   Lab Work: NONE If you have labs (blood work) drawn today and your tests are completely normal, you will receive your results only by: [place] (if you have MyChart) OR A paper copy in the mail If you have any lab test that is abnormal or we need to change your treatment, we will call you to review the results.   Testing/Procedures: NONE   Follow-Up: At [facility], you and your health needs are our priority.  As part of our continuing mission to provide you with exceptional heart care, we have created designated Provider Care Teams.  These Care Teams include your primary Cardiologist (physician) and Advanced Practice Providers (APPs -  Physician Assistants and Nurse Practitioners) who all work together to provide you with the care you need, when you need it.  Your next appointment:   As Needed with Dr Burt Knack     Important Information About Sugar

## 2021-08-23 NOTE — Progress Notes (Signed)
Cardiology Office Note:    Date:  08/23/2021   ID:  Roberto Ponce, DOB 11-02-1950, MRN [id number]  PCP:  Kristen Loader, [place] [facility] Cardiologist:  Werner Lean, MD     Referring MD: Kristen Loader, FNP   Chief Complaint  Patient presents with   Mitral Regurgitation    History of Present Illness:    Roberto Ponce is a 71 y.o. male presenting for follow-up evaluation of mitral regurgitation.  The patient was initially seen in December 2022 for evaluation of mitral regurgitation.  He has a history of ischemic heart disease and underwent CABG at age 37 with LIMA to LAD and RIMA to RCA.  He was felt to have restriction of the posterior leaflet with anterior leaflet override and severe mitral regurgitation based on TEE assessment.  He underwent right and left heart catheterization which showed normal left and right heart filling pressures but demonstrated severe stenosis in the native circumflex and he was treated with PCI using a drug-eluting stent.  His LIMA and RIMA grafts were patent.  I recommended that he follow-up in 6 months as his MR did not appear to have significant hemodynamic effects.  The patient is here today with his wife.  He has had a really difficult time since his last visit.  He was hospitalized in April with pneumonia and heart failure complicated by pneumomediastinum.  He is now on home oxygen and he is in a wheelchair today.  He has lost over 35 pounds since I saw him 6 months ago.  He looks incredibly frail.  He is short of breath but denies orthopnea, PND, or leg edema.  He has had no chest pain.  He has been increasing calories but still having trouble getting 2000 cal/day in.  He has been trying to eat a low-sodium diet, with eating fruit, vegetables, and nutritional shakes.  Past Medical History:  Diagnosis Date   Asthma    Coronary artery disease    Glaucoma    History of gallstones    Hyperlipidemia     Hypotension due to medication 06/22/2021   LVH (left ventricular hypertrophy)    MI (myocardial infarction) ([place])    MI, old    Mild aortic sclerosis    Mitral regurgitation    Mixed dyslipidemia    Shock ([place]) 06/22/2021   Venous stasis     Past Surgical History:  Procedure Laterality Date   CARDIAC CATHETERIZATION  09/09/1986   EF 56%   CARDIOVASCULAR STRESS TEST  12/16/2007   EF 54% NO ISCHEMIA   CHOLECYSTECTOMY     CORONARY ARTERY BYPASS GRAFT     LIMA TO THE LAD AND RIMA TO RIGHT CORONARY ARTERY   CORONARY STENT INTERVENTION N/A 02/14/2021   Procedure: CORONARY STENT INTERVENTION;  Surgeon: Sherren Mocha, MD;  Location: [facility];  Service: Cardiovascular;  Laterality: N/A;   ORCHIECTOMY Right 04/19/2021   Procedure: RIGHT RADICAL INGUINAL ORCHIECTOMY;  Surgeon: Lucas Mallow, MD;  Location: [facility];  Service: Urology;  Laterality: Right;  1 HR FOR CASE   RIGHT/LEFT HEART CATH AND CORONARY/GRAFT ANGIOGRAPHY N/A 02/14/2021   Procedure: RIGHT/LEFT HEART CATH AND CORONARY/GRAFT ANGIOGRAPHY;  Surgeon: Sherren Mocha, MD;  Location: [facility];  Service: Cardiovascular;  Laterality: N/A;   TEE WITHOUT CARDIOVERSION N/A 09/02/2020   Procedure: TRANSESOPHAGEAL ECHOCARDIOGRAM (TEE);  Surgeon: Werner Lean, MD;  Location: [facility];  Service: Cardiovascular;  Laterality: N/A;  US ECHOCARDIOGRAPHY  12/12/2007   EF 55-60%   VASECTOMY  1988    Current Medications: Current Meds  Medication Sig   acetaminophen (TYLENOL) 500 MG tablet Take 1,000 mg by mouth every 6 (six) hours as needed for moderate pain.   apixaban (ELIQUIS) 5 MG TABS tablet Take 1 tablet (5 mg total) by mouth 2 (two) times daily.   aspirin EC 81 MG tablet Take 1 tablet (81 mg total) by mouth daily. Swallow whole.   brimonidine (ALPHAGAN) 0.2 % ophthalmic solution Place 1 drop into the left eye 2 (two) times daily.   carvedilol (COREG) 3.125 MG tablet Take 1 tablet (3.125 mg total) by  mouth 2 (two) times daily with a meal.   dapagliflozin propanediol (FARXIGA) 10 MG TABS tablet Take 1 tablet (10 mg total) by mouth daily.   dorzolamide-timolol (COSOPT) 22.3-6.8 MG/ML ophthalmic solution Place 1 drop into both eyes 2 (two) times daily.   ezetimibe (ZETIA) 10 MG tablet TAKE 1 TABLET(10 MG) BY MOUTH DAILY   feeding supplement (ENSURE ENLIVE / ENSURE PLUS) LIQD Take 237 mLs by mouth 2 (two) times daily between meals.   furosemide (LASIX) 40 MG tablet Take 1 tablet (40 mg total) by mouth daily.   latanoprost (XALATAN) 0.005 % ophthalmic solution Place 1 drop into both eyes at bedtime.    nitroGLYCERIN (NITROSTAT) 0.4 MG SL tablet Place 1 tablet (0.4 mg total) under the tongue every 5 (five) minutes as needed for chest pain.   predniSONE (DELTASONE) 10 MG tablet Take 3 tablets (30 mg total) by mouth daily with breakfast.   SODIUM FLUORIDE 5000 PPM 1.1 % PSTE Apply 1 application. topically in the morning and at bedtime.   spironolactone (ALDACTONE) 25 MG tablet Take 0.5 tablets (12.5 mg total) by mouth daily.   sulfamethoxazole-trimethoprim (BACTRIM DS) 800-160 MG tablet Take 1 tablet by mouth 3 (three) times a week.   triamcinolone cream (KENALOG) 0.1 % Apply 1 application topically 2 (two) times daily as needed (eczema).   [DISCONTINUED] clopidogrel (PLAVIX) 75 MG tablet Take 1 tablet (75 mg total) by mouth daily with breakfast.     Allergies:   Patient has no known allergies.   Social History   Socioeconomic History   Marital status: Married    Spouse name: Not on file   Number of children: Not on file   Years of education: Not on file   Highest education level: Not on file  Occupational History   Not on file  Tobacco Use   Smoking status: Never   Smokeless tobacco: Never  Vaping Use   Vaping Use: Never used  Substance and Sexual Activity   Alcohol use: No   Drug use: No   Sexual activity: Not on file  Other Topics Concern   Not on file  Social History Narrative    Not on file   Social Determinants of Health   Financial Resource Strain: Not on file  Food Insecurity: Not on file  Transportation Needs: Not on file  Physical Activity: Not on file  Stress: Not on file  Social Connections: Not on file     Family History: The patient's family history includes Heart attack in his father; Stroke in his father.  ROS:   Please see the history of present illness.    All other systems reviewed and are negative.  EKGs/Labs/Other Studies Reviewed:    The following studies were reviewed today: TEE September 28, 2020: 1. The mitral valve is abnormal- there is both anterior mitral  valve  prolapse and some posterior tetthering that lead to a eccentric mitral  regurgitation; the Coanda effect is exhibited. Severe mitral valve  regurgitation. No evidence of mitral stenosis.   The mean mitral valve gradient is 3.0 mmHg.   2. Left ventricular ejection fraction, by estimation, is 60 to 65%. The  left ventricle has normal function and size.   3. Right ventricular systolic function is normal. The right ventricular  size is normal.   4. No left atrial/left atrial appendage thrombus was detected.   5. The aortic valve is tricuspid. Aortic valve regurgitation is not  visualized. No aortic stenosis is present.   Echo 07/05/2021:  1. Left ventricular ejection fraction, by estimation, is 40 to 45%. The  left ventricle has mildly decreased function. The left ventricle  demonstrates regional wall motion abnormalities (see scoring  diagram/findings for description). There is mild  concentric left ventricular hypertrophy. Left ventricular diastolic  parameters are indeterminate. There is akinesis of the left ventricular,  basal-mid inferior wall. There is hypokinesis of the left ventricular,  mid-apical inferolateral wall and inferior   wall.   2. Right ventricular systolic function is normal. The right ventricular  size is normal. There is mildly elevated pulmonary artery  systolic  pressure.   3. The mitral valve is abnormal. Mild to moderate mitral valve  regurgitation.   4. Tricuspid valve regurgitation is mild to moderate.   5. The aortic valve is normal in structure. Aortic valve regurgitation is  not visualized. No aortic stenosis is present.   Comparison(s): No significant change from prior study. Prior images  reviewed side by side.   Cardiac Cath 02/14/2021: 1.  Severe 3-vessel coronary artery disease with total occlusion of the LAD and total occlusion of the RCA, and severe stenosis of the native left circumflex 2.  Status post CABG with continued patency of the LIMA to LAD and RIMA to RCA 3.  Successful PCI of severe 95% stenosis of the mid circumflex, treated with a 3.0 x 15 mm Onyx frontier DES 4.  Severe mitral regurgitation by echo assessment, hemodynamics demonstrate low wedge pressure, no V wave, and no pulmonary hypertension.  Recommendations: DAPT with aspirin and clopidogrel x6 months, repeat echocardiogram in 6 months, defer transcatheter edge-to-edge mitral valve repair at present to reevaluate after PCI.  EKG:  EKG is not ordered today.    Recent Labs: 07/04/2021: ALT 73; B Natriuretic Peptide 89.9 07/08/2021: Magnesium 2.3 07/11/2021: BUN 21; Creatinine, Ser 0.63; Potassium 4.6; Sodium 131 07/12/2021: Hemoglobin 9.3; Platelets 471  Recent Lipid Panel    Component Value Date/Time   CHOL 136 06/16/2020 0954   TRIG 86 06/21/2021 0358   HDL 34 (L) 06/16/2020 0954   CHOLHDL 4.0 06/16/2020 0954   LDLCALC 86 06/16/2020 0954     Risk Assessment/Calculations:           Physical Exam:    VS:  BP 90/64   Pulse 74   Ht '5\' 9"'$  (1.753 m)   Wt 134 lb (60.8 kg)   SpO2 93%   BMI 19.79 kg/m     Wt Readings from Last 3 Encounters:  08/23/21 134 lb (60.8 kg)  08/07/21 135 lb 3.2 oz (61.3 kg)  07/25/21 137 lb 3.2 oz (62.2 kg)     GEN: Thin, elderly appearing male in no acute distress HEENT: Normal NECK: No JVD; No carotid  bruits LYMPHATICS: No lymphadenopathy CARDIAC: RRR, 2/6 holosystolic murmur at the apex RESPIRATORY:  Clear to auscultation without rales, wheezing  or rhonchi  ABDOMEN: Soft, non-tender, non-distended MUSCULOSKELETAL:  No edema; No deformity  SKIN: Warm and dry NEUROLOGIC:  Alert and oriented x 3 PSYCHIATRIC:  Normal affect   ASSESSMENT:    1. Nonrheumatic mitral valve regurgitation    PLAN:    In order of problems listed above:  The patient has ischemic cardiomyopathy and associated mitral regurgitation, likely moderate to severe.  His most recent echo actually showed mild to moderate mitral regurgitation.  His exam is consistent with moderate MR.  The patient has developed significant protein calorie malnutrition, marked weight loss over a short period of time, and has really had a difficult time since he had a complicated pneumonia in April.  He is not a candidate for transcatheter edge-to-edge mitral valve repair and I do not think it would really help him at all from a symptomatic perspective.  We talked about his nutritional status at length today.  I have recommended that he liberalize his diet and basically eat what ever he feels like.  He really needs to get more calories and and we talked about not adding salt to his food but eating things that taste good to him.  He will follow-up with his primary cardiologist and I be happy to see him back in the future if problems arise.  With respect to his coronary artery disease.  He is out greater than 6 months from PCI and he is treated with apixaban for DVT.  I recommended that he can stop clopidogrel and start aspirin 81 mg daily for his CAD.      Medication Adjustments/Labs and Tests Ordered: Current medicines are reviewed at length with the patient today.  Concerns regarding medicines are outlined above.  No orders of the defined types were placed in this encounter.  Meds ordered this encounter  Medications   aspirin EC 81 MG tablet     Sig: Take 1 tablet (81 mg total) by mouth daily. Swallow whole.    Dispense:  90 tablet    Refill:  3    Patient Instructions  Medication Instructions:  STOP Clopidogrel (Plavix) START/Continue Aspirin '81mg'$  daily *If you need a refill on your cardiac medications before your next appointment, please call your pharmacy*   Lab Work: NONE If you have labs (blood work) drawn today and your tests are completely normal, you will receive your results only by: [facility] (if you have MyChart) OR A paper copy in the mail If you have any lab test that is abnormal or we need to change your treatment, we will call you to review the results.   Testing/Procedures: NONE   Follow-Up: At [facility], you and your health needs are our priority.  As part of our continuing mission to provide you with exceptional heart care, we have created designated Provider Care Teams.  These Care Teams include your primary Cardiologist (physician) and Advanced Practice Providers (APPs -  Physician Assistants and Nurse Practitioners) who all work together to provide you with the care you need, when you need it.  Your next appointment:   As Needed with Dr Burt Knack     Important Information About Sugar         Signed, Sherren Mocha, MD  08/23/2021 1:20 PM    [place]

## 2021-08-24 DIAGNOSIS — I252 Old myocardial infarction: Secondary | ICD-10-CM | POA: Diagnosis not present

## 2021-08-24 DIAGNOSIS — I5022 Chronic systolic (congestive) heart failure: Secondary | ICD-10-CM | POA: Diagnosis not present

## 2021-08-24 DIAGNOSIS — I340 Nonrheumatic mitral (valve) insufficiency: Secondary | ICD-10-CM | POA: Diagnosis not present

## 2021-08-24 DIAGNOSIS — I25810 Atherosclerosis of coronary artery bypass graft(s) without angina pectoris: Secondary | ICD-10-CM | POA: Diagnosis not present

## 2021-08-24 DIAGNOSIS — I2510 Atherosclerotic heart disease of native coronary artery without angina pectoris: Secondary | ICD-10-CM | POA: Diagnosis not present

## 2021-08-24 DIAGNOSIS — J982 Interstitial emphysema: Secondary | ICD-10-CM | POA: Diagnosis not present

## 2021-08-24 DIAGNOSIS — J9621 Acute and chronic respiratory failure with hypoxia: Secondary | ICD-10-CM | POA: Diagnosis not present

## 2021-08-24 DIAGNOSIS — Z9981 Dependence on supplemental oxygen: Secondary | ICD-10-CM | POA: Diagnosis not present

## 2021-08-24 DIAGNOSIS — J45909 Unspecified asthma, uncomplicated: Secondary | ICD-10-CM | POA: Diagnosis not present

## 2021-08-24 DIAGNOSIS — I350 Nonrheumatic aortic (valve) stenosis: Secondary | ICD-10-CM | POA: Diagnosis not present

## 2021-08-24 LAB — BASIC METABOLIC PANEL
BUN/Creatinine Ratio: 39 — ABNORMAL HIGH (ref 10–24)
BUN: 17 mg/dL (ref 8–27)
CO2: 30 mmol/L — ABNORMAL HIGH (ref 20–29)
Calcium: 8.2 mg/dL — ABNORMAL LOW (ref 8.6–10.2)
Chloride: 79 mmol/L — ABNORMAL LOW (ref 96–106)
Creatinine, Ser: 0.44 mg/dL — ABNORMAL LOW (ref 0.76–1.27)
Glucose: 95 mg/dL (ref 70–99)
Potassium: 4.4 mmol/L (ref 3.5–5.2)
Sodium: 121 mmol/L — ABNORMAL LOW (ref 134–144)
eGFR: 114 mL/min/{1.73_m2} (ref >59)

## 2021-08-25 ENCOUNTER — Telehealth: Payer: Self-pay

## 2021-08-25 DIAGNOSIS — Z79899 Other long term (current) drug therapy: Secondary | ICD-10-CM

## 2021-08-25 MED ORDER — FUROSEMIDE 40 MG PO TABS: ORAL_TABLET | ORAL | 3 refills | Status: DC

## 2021-08-25 NOTE — Telephone Encounter (Signed)
Called and reviewed results and recommendations with patient who verbalizes understanding. Medication updated on [facility], order for repeat BMET placed and lab appt scheduled for 09/28/21.

## 2021-08-25 NOTE — Telephone Encounter (Signed)
-----   Message from Thayer Headings, MD sent at 08/24/2021  1:34 PM EDT ----- Sodium and chloride are low He may be getting too much diuretic. Lasix is listed twice Will you make sure he is not doubling up in lasix.  Reduce lasix to 40 mg a day on M,W,F Recheck bmp in 1 month

## 2021-08-25 NOTE — Telephone Encounter (Signed)
  Pt is calling back and requesting to speak with RN again. He said to call [phone]

## 2021-08-25 NOTE — Telephone Encounter (Signed)
Returned call to patient who wished for me to relay recommendations to wife. I did, and all questions answered.

## 2021-08-31 ENCOUNTER — Telehealth: Payer: Self-pay

## 2021-08-31 NOTE — Telephone Encounter (Signed)
Could be related to Bactrim, pulm started him on this 07/28/21. Could also be related to his Lasix, both meds can cause skin-related hypersensitivity reaction. Agree PCP evaluation would be helpful too.

## 2021-08-31 NOTE — Telephone Encounter (Signed)
Discussed with the patient's wife. The patient sees Pulmonary this week and will finish his course of Bactrim. He also decreased his Lasix this week to three times weekly.  She reports the patient has no obvious swelling at this time.  They will discuss sores with Pulmonary and see if sores diminish after stopping Bactrim.  If the sores persist, the will call back to see if Dr. Acie Fredrickson is OK with holding Lasix to see if it could be the culprit.   In the meantime, they will call their PCP for evaluation in case the sores are not medication related.  They were grateful for call and agrees with plan.

## 2021-08-31 NOTE — Telephone Encounter (Signed)
The patient reports he has had sores in his mouth and nose for a little over 1 week and he thinks it is medication related.  He saw Dr. Burt Knack 7/5 and Plavix was stopped and ASA started, but he states he has never had issues with ASA before.  He restarted Lasix 6/19 per Dr. Acie Fredrickson and said he saw "sores" listed on the insert.  His wife looked while on the phone and said the sores are mostly on his tongue and are pink, not white like thrush. The patient saw his PCP 1.5 months ago - he understands the sores may not be medication related and needs to make a follow-up appointment if the sores do not improve.  Will route to pharmacy to review medication list for potential cause.

## 2021-09-01 ENCOUNTER — Ambulatory Visit (INDEPENDENT_AMBULATORY_CARE_PROVIDER_SITE_OTHER): Payer: PPO | Admitting: Pulmonary Disease

## 2021-09-01 ENCOUNTER — Ambulatory Visit (INDEPENDENT_AMBULATORY_CARE_PROVIDER_SITE_OTHER): Payer: PPO

## 2021-09-01 ENCOUNTER — Encounter: Payer: Self-pay | Admitting: Pulmonary Disease

## 2021-09-01 ENCOUNTER — Telehealth: Payer: Self-pay | Admitting: Pulmonary Disease

## 2021-09-01 VITALS — BP 114/70 | HR 98 | Ht 69.0 in

## 2021-09-01 DIAGNOSIS — J939 Pneumothorax, unspecified: Secondary | ICD-10-CM | POA: Diagnosis not present

## 2021-09-01 DIAGNOSIS — K120 Recurrent oral aphthae: Secondary | ICD-10-CM | POA: Diagnosis not present

## 2021-09-01 DIAGNOSIS — J982 Interstitial emphysema: Secondary | ICD-10-CM

## 2021-09-01 DIAGNOSIS — J8489 Other specified interstitial pulmonary diseases: Secondary | ICD-10-CM

## 2021-09-01 MED ORDER — NYSTATIN 100000 UNIT/ML MT SUSP: 5.0000 mL | Freq: Three times a day (TID) | OROMUCOSAL | 1 refills | Status: DC | PRN

## 2021-09-01 NOTE — Patient Instructions (Addendum)
Prednisone Taper: '20mg'$  daily 7/15 to 7/29 '15mg'$  daily 7/30 to 8/13 '10mg'$  daily 8/14 to 8/28 '5mg'$  daily 8/29 to 9/5 Then Stop  Take bactrim until 7/29 then stop  Start magic mouth wash 3 times per day as needed for the oral sores  Follow up in 1 month

## 2021-09-01 NOTE — Progress Notes (Unsigned)
Synopsis: Referred in June 2023 for hospital follow up  Subjective:   PATIENT ID: Roberto Ponce GENDER: male DOB: 02/09/1951, MRN: [id number]  HPI  Chief Complaint  Patient presents with   Follow-up    1 mo f/u after starting Bactrim and prednisone. States he has developed small sores in his mouth. Believes it is coming from the prednisone or antibiotic. Breathing has been stable since last visit.    Roberto Ponce is a 71 year old male, never smoker with coronary artery disease, moderate-severe mitral regurgitation and testicular cancer s/p orchiectomy who is referred to pulmonary clinic for hospital follow up for acute hypoxemic respiratory failure due to viral pneumonia with post-viral BOOP and pneumomediastinum.   He reports he is feeling better since last visit with the increase in steroids to '30mg'$  daily. He remains on bactrim prophlaxis.   He is using 2L of oxygen. He is ambulating more. His appetite is better and he is maintaining his weight.   He complains of sores on his tongue and mouth.   Past Medical History:  Diagnosis Date   Asthma    Coronary artery disease    Glaucoma    History of gallstones    Hyperlipidemia    Hypotension due to medication 06/22/2021   LVH (left ventricular hypertrophy)    MI (myocardial infarction) ([place])    MI, old    Mild aortic sclerosis    Mitral regurgitation    Mixed dyslipidemia    Shock ([place]) 06/22/2021   Venous stasis      Family History  Problem Relation Age of Onset   Heart attack Father    Stroke Father      Social History   Socioeconomic History   Marital status: Married    Spouse name: Not on file   Number of children: Not on file   Years of education: Not on file   Highest education level: Not on file  Occupational History   Not on file  Tobacco Use   Smoking status: Never   Smokeless tobacco: Never  Vaping Use   Vaping Use: Never used  Substance and Sexual Activity   Alcohol use: No   Drug use: No    Sexual activity: Not on file  Other Topics Concern   Not on file  Social History Narrative   Not on file   Social Determinants of Health   Financial Resource Strain: Not on file  Food Insecurity: Not on file  Transportation Needs: Not on file  Physical Activity: Not on file  Stress: Not on file  Social Connections: Not on file  Intimate Partner Violence: Not on file     No Known Allergies   Outpatient Medications Prior to Visit  Medication Sig Dispense Refill   acetaminophen (TYLENOL) 500 MG tablet Take 1,000 mg by mouth every 6 (six) hours as needed for moderate pain.     apixaban (ELIQUIS) 5 MG TABS tablet Take 1 tablet (5 mg total) by mouth 2 (two) times daily. 60 tablet 2   aspirin EC 81 MG tablet Take 1 tablet (81 mg total) by mouth daily. Swallow whole. 90 tablet 3   brimonidine (ALPHAGAN) 0.2 % ophthalmic solution Place 1 drop into the left eye 2 (two) times daily.     carvedilol (COREG) 3.125 MG tablet Take 1 tablet (3.125 mg total) by mouth 2 (two) times daily with a meal. 60 tablet 2   dapagliflozin propanediol (FARXIGA) 10 MG TABS tablet Take 1 tablet (10 mg  total) by mouth daily. 30 tablet 2   dorzolamide-timolol (COSOPT) 22.3-6.8 MG/ML ophthalmic solution Place 1 drop into both eyes 2 (two) times daily.     ezetimibe (ZETIA) 10 MG tablet TAKE 1 TABLET(10 MG) BY MOUTH DAILY 90 tablet 1   feeding supplement (ENSURE ENLIVE / ENSURE PLUS) LIQD Take 237 mLs by mouth 2 (two) times daily between meals. 237 mL 12   furosemide (LASIX) 40 MG tablet Take 1 tablet by mouth on Mondays, Wednesdays, and Fridays only 40 tablet 3   latanoprost (XALATAN) 0.005 % ophthalmic solution Place 1 drop into both eyes at bedtime.      nitroGLYCERIN (NITROSTAT) 0.4 MG SL tablet Place 1 tablet (0.4 mg total) under the tongue every 5 (five) minutes as needed for chest pain. 25 tablet 3   predniSONE (DELTASONE) 10 MG tablet Take 3 tablets (30 mg total) by mouth daily with breakfast. 90 tablet 1    SODIUM FLUORIDE 5000 PPM 1.1 % PSTE Apply 1 application. topically in the morning and at bedtime.     spironolactone (ALDACTONE) 25 MG tablet Take 0.5 tablets (12.5 mg total) by mouth daily. 30 tablet 2   sulfamethoxazole-trimethoprim (BACTRIM DS) 800-160 MG tablet Take 1 tablet by mouth 3 (three) times a week. 24 tablet 1   triamcinolone cream (KENALOG) 0.1 % Apply 1 application topically 2 (two) times daily as needed (eczema).     No facility-administered medications prior to visit.   Review of Systems  Constitutional:  Negative for chills, fever, malaise/fatigue and weight loss.  HENT:  Negative for congestion, sinus pain and sore throat.   Eyes: Negative.   Respiratory:  Positive for shortness of breath. Negative for cough, hemoptysis, sputum production and wheezing.   Cardiovascular:  Negative for chest pain, palpitations, orthopnea, claudication and leg swelling.  Gastrointestinal:  Negative for abdominal pain, heartburn, nausea and vomiting.  Genitourinary: Negative.   Musculoskeletal:  Negative for joint pain and myalgias.  Skin:  Negative for rash.  Neurological:  Negative for weakness.  Endo/Heme/Allergies: Negative.   Psychiatric/Behavioral: Negative.      Objective:   Vitals:   09/01/21 1348  BP: 114/70  Pulse: 98  SpO2: 99%  Height: '5\' 9"'$  (1.753 m)   Physical Exam Constitutional:      General: He is not in acute distress. HENT:     Head: Normocephalic and atraumatic.     Mouth/Throat:     Tongue: Lesions (sores) present.  Eyes:     Extraocular Movements: Extraocular movements intact.     Conjunctiva/sclera: Conjunctivae normal.     Pupils: Pupils are equal, round, and reactive to light.  Cardiovascular:     Rate and Rhythm: Normal rate and regular rhythm.     Pulses: Normal pulses.     Heart sounds: Normal heart sounds. No murmur heard. Pulmonary:     Breath sounds: Decreased air movement present. Decreased breath sounds and rales present.  Abdominal:      General: Bowel sounds are normal.     Palpations: Abdomen is soft.  Musculoskeletal:     Right lower leg: No edema.     Left lower leg: No edema.  Lymphadenopathy:     Cervical: No cervical adenopathy.  Skin:    General: Skin is warm and dry.  Neurological:     General: No focal deficit present.  Psychiatric:        Mood and Affect: Mood normal.        Behavior: Behavior normal.  Thought Content: Thought content normal.        Judgment: Judgment normal.    CBC    Component Value Date/Time   WBC 18.1 (H) 07/12/2021 0402   RBC 3.14 (L) 07/12/2021 0402   HGB 9.3 (L) 07/12/2021 0402   HGB 14.0 01/26/2021 0859   HCT 29.3 (L) 07/12/2021 0402   HCT 41.2 01/26/2021 0859   PLT 471 (H) 07/12/2021 0402   PLT 186 01/26/2021 0859   MCV 93.3 07/12/2021 0402   MCV 89 01/26/2021 0859   MCH 29.6 07/12/2021 0402   MCHC 31.7 07/12/2021 0402   RDW 17.8 (H) 07/12/2021 0402   RDW 14.1 01/26/2021 0859   LYMPHSABS 1.4 07/04/2021 2210   MONOABS 0.6 07/04/2021 2210   EOSABS 0.5 07/04/2021 2210   BASOSABS 0.0 07/04/2021 2210      Latest Ref Rng & Units 08/23/2021   11:12 AM 07/11/2021    3:47 AM 07/10/2021    8:37 AM  BMP  Glucose 70 - 99 mg/dL 95  114  90   BUN 8 - 27 mg/dL '17  21  21   '$ Creatinine 0.76 - 1.27 mg/dL 0.44  0.63  0.64   BUN/Creat Ratio 10 - 24 39     Sodium 134 - 144 mmol/L 121  131  131   Potassium 3.5 - 5.2 mmol/L 4.4  4.6  4.0   Chloride 96 - 106 mmol/L 79  98  95   CO2 20 - 29 mmol/L '30  29  29   '$ Calcium 8.6 - 10.2 mg/dL 8.2  8.3  8.0    Chest imaging: CXR 09/01/21 1. Persistent small left apical and medial pneumothorax. 2. Interval resolution of pneumomediastinum. 3. Advanced left lung fibrotic changes and chronic scarring. 4. No new acute cardiopulmonary process.  CXR 07/25/21 1. Increasing left basilar airspace disease. 2. Stable small left apical pneumothorax in left pneumomediastinum. 3. Stable chronic interstitial changes.  CXR 07/11/21 Stable  cardiomediastinal contours. Pneumomediastinum is again noted bilaterally. Gas is noted tracking into the lower neck as before. Unchanged asymmetric opacification throughout the left lung. Mild peripheral opacities within the right lower lung is also unchanged. No signs of pneumothorax. Trace left pleural fluid. Unchanged.  CTA Chest 07/10/21 1. Negative for pulmonary embolism to the segmental level. 2. Extensive pneumomediastinum tracking into the low neck and involving the LEFT greater than RIGHT mediastinal border and extending along the esophagus and underneath the crus of the LEFT hemidiaphragm but not extending into the abdomen. 3. Etiology for above process is uncertain. Perhaps cough leading to barotrauma. Esophageal source is another differential consideration. No mediastinal fluid or focal thickening of the esophagus. This does not allow for exclusion of esophageal source but is perhaps reassuring 4. Small associated and partially loculated LEFT-sided pneumothorax at the apex, anteriorly and with a small loculated component in the major fissure. 5. Small LEFT-sided pleural effusion slightly diminished with resolution of RIGHT-sided effusion. 6. Improving airspace disease still with considerable ground-glass and septal thickening in the LEFT chest and worse at the LEFT lung base. 7. Aortic atherosclerosis, coronary artery disease and changes of CABG.  PFT:     No data to display          Labs:  Path:  Echo 07/05/21: EF 40-45%. RV systolic function and size is normal. Mildly elevated PASP.  Heart Catheterization:  Assessment & Plan:   Bronchiolitis obliterans with organizing pneumonia ([place]) - Plan: DG Chest 2 View  Pneumomediastinum [facility]) - Plan: DG Chest  2 View  Canker sores oral - Plan: magic mouthwash (nystatin, lidocaine, diphenhydrAMINE) suspension  Discussion: Roberto Ponce is a 71 year old male, never smoker with coronary artery disease,  moderate-severe mitral regurgitation and testicular cancer s/p orchiectomy who is referred to pulmonary clinic for hospital follow up for acute hypoxemic respiratory failure due to viral pneumonia with post-viral BOOP and pneumomediastinum.   He has done well since last visit with extending higher dose prednisone. We will plan to taper prednisone as below.  Prednisone Taper: '20mg'$  daily 7/15 to 7/29 '15mg'$  daily 7/30 to 8/13 '10mg'$  daily 8/14 to 8/28 '5mg'$  daily 8/29 to 9/5 Then Stop  Take bactrim until 7/29 then stop  He is to start magic mouth wash three times per day as needed. He appears to have sores on his tongue. I did not note thrush on exam.   He is to remain as active as possible.   Follow up in 1 month.  Freda Jackson, MD [facility] Office: [phone]   Current Outpatient Medications:    acetaminophen (TYLENOL) 500 MG tablet, Take 1,000 mg by mouth every 6 (six) hours as needed for moderate pain., Disp: , Rfl:    apixaban (ELIQUIS) 5 MG TABS tablet, Take 1 tablet (5 mg total) by mouth 2 (two) times daily., Disp: 60 tablet, Rfl: 2   aspirin EC 81 MG tablet, Take 1 tablet (81 mg total) by mouth daily. Swallow whole., Disp: 90 tablet, Rfl: 3   brimonidine (ALPHAGAN) 0.2 % ophthalmic solution, Place 1 drop into the left eye 2 (two) times daily., Disp: , Rfl:    carvedilol (COREG) 3.125 MG tablet, Take 1 tablet (3.125 mg total) by mouth 2 (two) times daily with a meal., Disp: 60 tablet, Rfl: 2   dapagliflozin propanediol (FARXIGA) 10 MG TABS tablet, Take 1 tablet (10 mg total) by mouth daily., Disp: 30 tablet, Rfl: 2   dorzolamide-timolol (COSOPT) 22.3-6.8 MG/ML ophthalmic solution, Place 1 drop into both eyes 2 (two) times daily., Disp: , Rfl:    ezetimibe (ZETIA) 10 MG tablet, TAKE 1 TABLET(10 MG) BY MOUTH DAILY, Disp: 90 tablet, Rfl: 1   feeding supplement (ENSURE ENLIVE / ENSURE PLUS) LIQD, Take 237 mLs by mouth 2 (two) times daily between meals., Disp:  237 mL, Rfl: 12   furosemide (LASIX) 40 MG tablet, Take 1 tablet by mouth on Mondays, Wednesdays, and Fridays only, Disp: 40 tablet, Rfl: 3   latanoprost (XALATAN) 0.005 % ophthalmic solution, Place 1 drop into both eyes at bedtime. , Disp: , Rfl:    magic mouthwash (nystatin, lidocaine, diphenhydrAMINE) suspension, Take 5 mLs by mouth 3 (three) times daily as needed for mouth pain., Disp: 180 mL, Rfl: 1   nitroGLYCERIN (NITROSTAT) 0.4 MG SL tablet, Place 1 tablet (0.4 mg total) under the tongue every 5 (five) minutes as needed for chest pain., Disp: 25 tablet, Rfl: 3   predniSONE (DELTASONE) 10 MG tablet, Take 3 tablets (30 mg total) by mouth daily with breakfast., Disp: 90 tablet, Rfl: 1   SODIUM FLUORIDE 5000 PPM 1.1 % PSTE, Apply 1 application. topically in the morning and at bedtime., Disp: , Rfl:    spironolactone (ALDACTONE) 25 MG tablet, Take 0.5 tablets (12.5 mg total) by mouth daily., Disp: 30 tablet, Rfl: 2   sulfamethoxazole-trimethoprim (BACTRIM DS) 800-160 MG tablet, Take 1 tablet by mouth 3 (three) times a week., Disp: 24 tablet, Rfl: 1   triamcinolone cream (KENALOG) 0.1 %, Apply 1 application topically 2 (two) times  daily as needed (eczema)., Disp: , Rfl:

## 2021-09-01 NOTE — Telephone Encounter (Signed)
Called and spoke with pharmacy staff and cleared up the order for the magic mouth wash. Nothing further needed

## 2021-09-04 ENCOUNTER — Encounter: Payer: Self-pay | Admitting: Pulmonary Disease

## 2021-09-05 DIAGNOSIS — H4089 Other specified glaucoma: Secondary | ICD-10-CM | POA: Diagnosis not present

## 2021-09-05 DIAGNOSIS — N400 Enlarged prostate without lower urinary tract symptoms: Secondary | ICD-10-CM | POA: Diagnosis not present

## 2021-09-05 DIAGNOSIS — I509 Heart failure, unspecified: Secondary | ICD-10-CM | POA: Diagnosis not present

## 2021-09-05 DIAGNOSIS — I2510 Atherosclerotic heart disease of native coronary artery without angina pectoris: Secondary | ICD-10-CM | POA: Diagnosis not present

## 2021-09-05 DIAGNOSIS — E782 Mixed hyperlipidemia: Secondary | ICD-10-CM | POA: Diagnosis not present

## 2021-09-12 DIAGNOSIS — J9621 Acute and chronic respiratory failure with hypoxia: Secondary | ICD-10-CM | POA: Diagnosis not present

## 2021-09-12 DIAGNOSIS — I5022 Chronic systolic (congestive) heart failure: Secondary | ICD-10-CM | POA: Diagnosis not present

## 2021-09-19 DIAGNOSIS — H401133 Primary open-angle glaucoma, bilateral, severe stage: Secondary | ICD-10-CM | POA: Diagnosis not present

## 2021-09-21 DIAGNOSIS — I350 Nonrheumatic aortic (valve) stenosis: Secondary | ICD-10-CM | POA: Diagnosis not present

## 2021-09-21 DIAGNOSIS — I340 Nonrheumatic mitral (valve) insufficiency: Secondary | ICD-10-CM | POA: Diagnosis not present

## 2021-09-21 DIAGNOSIS — I25810 Atherosclerosis of coronary artery bypass graft(s) without angina pectoris: Secondary | ICD-10-CM | POA: Diagnosis not present

## 2021-09-21 DIAGNOSIS — J9621 Acute and chronic respiratory failure with hypoxia: Secondary | ICD-10-CM | POA: Diagnosis not present

## 2021-09-21 DIAGNOSIS — J982 Interstitial emphysema: Secondary | ICD-10-CM | POA: Diagnosis not present

## 2021-09-21 DIAGNOSIS — J45909 Unspecified asthma, uncomplicated: Secondary | ICD-10-CM | POA: Diagnosis not present

## 2021-09-21 DIAGNOSIS — I252 Old myocardial infarction: Secondary | ICD-10-CM | POA: Diagnosis not present

## 2021-09-21 DIAGNOSIS — Z9981 Dependence on supplemental oxygen: Secondary | ICD-10-CM | POA: Diagnosis not present

## 2021-09-21 DIAGNOSIS — I2510 Atherosclerotic heart disease of native coronary artery without angina pectoris: Secondary | ICD-10-CM | POA: Diagnosis not present

## 2021-09-21 DIAGNOSIS — I5022 Chronic systolic (congestive) heart failure: Secondary | ICD-10-CM | POA: Diagnosis not present

## 2021-09-25 DIAGNOSIS — E782 Mixed hyperlipidemia: Secondary | ICD-10-CM | POA: Diagnosis not present

## 2021-09-25 DIAGNOSIS — Z125 Encounter for screening for malignant neoplasm of prostate: Secondary | ICD-10-CM | POA: Diagnosis not present

## 2021-09-25 DIAGNOSIS — N400 Enlarged prostate without lower urinary tract symptoms: Secondary | ICD-10-CM | POA: Diagnosis not present

## 2021-09-25 DIAGNOSIS — E871 Hypo-osmolality and hyponatremia: Secondary | ICD-10-CM | POA: Diagnosis not present

## 2021-09-25 DIAGNOSIS — D649 Anemia, unspecified: Secondary | ICD-10-CM | POA: Diagnosis not present

## 2021-09-25 DIAGNOSIS — Z0000 Encounter for general adult medical examination without abnormal findings: Secondary | ICD-10-CM | POA: Diagnosis not present

## 2021-09-26 DIAGNOSIS — J982 Interstitial emphysema: Secondary | ICD-10-CM | POA: Diagnosis not present

## 2021-09-26 DIAGNOSIS — I82409 Acute embolism and thrombosis of unspecified deep veins of unspecified lower extremity: Secondary | ICD-10-CM | POA: Diagnosis not present

## 2021-09-26 DIAGNOSIS — E46 Unspecified protein-calorie malnutrition: Secondary | ICD-10-CM | POA: Diagnosis not present

## 2021-09-26 DIAGNOSIS — E261 Secondary hyperaldosteronism: Secondary | ICD-10-CM | POA: Diagnosis not present

## 2021-09-26 DIAGNOSIS — I110 Hypertensive heart disease with heart failure: Secondary | ICD-10-CM | POA: Diagnosis not present

## 2021-09-26 DIAGNOSIS — I25119 Atherosclerotic heart disease of native coronary artery with unspecified angina pectoris: Secondary | ICD-10-CM | POA: Diagnosis not present

## 2021-09-26 DIAGNOSIS — I509 Heart failure, unspecified: Secondary | ICD-10-CM | POA: Diagnosis not present

## 2021-09-26 DIAGNOSIS — F3342 Major depressive disorder, recurrent, in full remission: Secondary | ICD-10-CM | POA: Diagnosis not present

## 2021-09-26 DIAGNOSIS — I4891 Unspecified atrial fibrillation: Secondary | ICD-10-CM | POA: Diagnosis not present

## 2021-09-26 DIAGNOSIS — D6869 Other thrombophilia: Secondary | ICD-10-CM | POA: Diagnosis not present

## 2021-09-26 DIAGNOSIS — E785 Hyperlipidemia, unspecified: Secondary | ICD-10-CM | POA: Diagnosis not present

## 2021-09-26 DIAGNOSIS — J9610 Chronic respiratory failure, unspecified whether with hypoxia or hypercapnia: Secondary | ICD-10-CM | POA: Diagnosis not present

## 2021-09-27 ENCOUNTER — Telehealth: Payer: Self-pay

## 2021-09-27 NOTE — Telephone Encounter (Signed)
The patient called the Structural [facility] line and left a message for Dr. Oralia Rud nurse to call him back.  Called the patient and spoke with his wife. She states Roberto Ponce gained 1 pound since last night. She says his ankles are a little puffy and do improve with elevation.  She also says for about a week, his has had a little more DOE than usual.   Reiterated to her to weigh daily in the AM before food. Encouraged limiting salt and continued leg elevation.  Will route to Dr. Gasper Sells and his nurse for follow-up.

## 2021-09-27 NOTE — Telephone Encounter (Signed)
Called spoke with pt and spouse.  Reports increased SOB, DOE and feet swelling.  Reports no recent increase in salt intake, does elevate legs throughout the day.  BP runs around 105/60-HR 70-low 100's. Takes furosemide 40 mg on M-W-F also takes aldactone 12.5 mg PO QD.   Spouse does not know if symptoms are r/t heart or lung issues.  Would feel better if pt is seen by provider.  Scheduled OV with Dr. Gasper Sells for 09/28/21 at 3:30 pm.

## 2021-09-28 ENCOUNTER — Ambulatory Visit: Payer: PPO | Admitting: Internal Medicine

## 2021-09-28 ENCOUNTER — Encounter (HOSPITAL_COMMUNITY): Payer: Self-pay | Admitting: Internal Medicine

## 2021-09-28 ENCOUNTER — Other Ambulatory Visit: Payer: PPO

## 2021-09-28 ENCOUNTER — Emergency Department (HOSPITAL_COMMUNITY): Payer: PPO

## 2021-09-28 ENCOUNTER — Inpatient Hospital Stay (HOSPITAL_COMMUNITY)
Admission: EM | Admit: 2021-09-28 | Discharge: 2021-10-01 | DRG: 391 | Disposition: A | Discharge status: Home Health | Payer: PPO | Attending: Internal Medicine | Admitting: Internal Medicine

## 2021-09-28 ENCOUNTER — Telehealth: Payer: Self-pay

## 2021-09-28 ENCOUNTER — Other Ambulatory Visit: Payer: Self-pay

## 2021-09-28 DIAGNOSIS — K922 Gastrointestinal hemorrhage, unspecified: Secondary | ICD-10-CM | POA: Diagnosis present

## 2021-09-28 DIAGNOSIS — Z7982 Long term (current) use of aspirin: Secondary | ICD-10-CM

## 2021-09-28 DIAGNOSIS — E43 Unspecified severe protein-calorie malnutrition: Secondary | ICD-10-CM | POA: Diagnosis not present

## 2021-09-28 DIAGNOSIS — Z6820 Body mass index (BMI) 20.0-20.9, adult: Secondary | ICD-10-CM

## 2021-09-28 DIAGNOSIS — K449 Diaphragmatic hernia without obstruction or gangrene: Secondary | ICD-10-CM | POA: Diagnosis not present

## 2021-09-28 DIAGNOSIS — D509 Iron deficiency anemia, unspecified: Secondary | ICD-10-CM | POA: Diagnosis present

## 2021-09-28 DIAGNOSIS — Z7901 Long term (current) use of anticoagulants: Secondary | ICD-10-CM

## 2021-09-28 DIAGNOSIS — Z8249 Family history of ischemic heart disease and other diseases of the circulatory system: Secondary | ICD-10-CM

## 2021-09-28 DIAGNOSIS — R64 Cachexia: Secondary | ICD-10-CM | POA: Diagnosis present

## 2021-09-28 DIAGNOSIS — J9611 Chronic respiratory failure with hypoxia: Secondary | ICD-10-CM | POA: Diagnosis present

## 2021-09-28 DIAGNOSIS — Z86718 Personal history of other venous thrombosis and embolism: Secondary | ICD-10-CM | POA: Diagnosis not present

## 2021-09-28 DIAGNOSIS — R0602 Shortness of breath: Secondary | ICD-10-CM | POA: Diagnosis not present

## 2021-09-28 DIAGNOSIS — I5022 Chronic systolic (congestive) heart failure: Secondary | ICD-10-CM | POA: Diagnosis not present

## 2021-09-28 DIAGNOSIS — R195 Other fecal abnormalities: Secondary | ICD-10-CM

## 2021-09-28 DIAGNOSIS — Z823 Family history of stroke: Secondary | ICD-10-CM

## 2021-09-28 DIAGNOSIS — I878 Other specified disorders of veins: Secondary | ICD-10-CM | POA: Diagnosis present

## 2021-09-28 DIAGNOSIS — I110 Hypertensive heart disease with heart failure: Secondary | ICD-10-CM | POA: Diagnosis not present

## 2021-09-28 DIAGNOSIS — E222 Syndrome of inappropriate secretion of antidiuretic hormone: Secondary | ICD-10-CM | POA: Diagnosis not present

## 2021-09-28 DIAGNOSIS — Z79899 Other long term (current) drug therapy: Secondary | ICD-10-CM

## 2021-09-28 DIAGNOSIS — I255 Ischemic cardiomyopathy: Secondary | ICD-10-CM | POA: Diagnosis not present

## 2021-09-28 DIAGNOSIS — Z7984 Long term (current) use of oral hypoglycemic drugs: Secondary | ICD-10-CM

## 2021-09-28 DIAGNOSIS — H409 Unspecified glaucoma: Secondary | ICD-10-CM | POA: Diagnosis not present

## 2021-09-28 DIAGNOSIS — I340 Nonrheumatic mitral (valve) insufficiency: Secondary | ICD-10-CM | POA: Diagnosis present

## 2021-09-28 DIAGNOSIS — I2510 Atherosclerotic heart disease of native coronary artery without angina pectoris: Secondary | ICD-10-CM | POA: Diagnosis present

## 2021-09-28 DIAGNOSIS — E782 Mixed hyperlipidemia: Secondary | ICD-10-CM | POA: Diagnosis not present

## 2021-09-28 DIAGNOSIS — J45909 Unspecified asthma, uncomplicated: Secondary | ICD-10-CM | POA: Diagnosis not present

## 2021-09-28 DIAGNOSIS — Z9981 Dependence on supplemental oxygen: Secondary | ICD-10-CM

## 2021-09-28 DIAGNOSIS — R54 Age-related physical debility: Secondary | ICD-10-CM | POA: Diagnosis not present

## 2021-09-28 DIAGNOSIS — K648 Other hemorrhoids: Secondary | ICD-10-CM | POA: Diagnosis not present

## 2021-09-28 DIAGNOSIS — M7989 Other specified soft tissue disorders: Secondary | ICD-10-CM | POA: Diagnosis not present

## 2021-09-28 DIAGNOSIS — R0682 Tachypnea, not elsewhere classified: Secondary | ICD-10-CM | POA: Diagnosis not present

## 2021-09-28 DIAGNOSIS — Z951 Presence of aortocoronary bypass graft: Secondary | ICD-10-CM

## 2021-09-28 DIAGNOSIS — K5730 Diverticulosis of large intestine without perforation or abscess without bleeding: Secondary | ICD-10-CM | POA: Diagnosis not present

## 2021-09-28 DIAGNOSIS — K2100 Gastro-esophageal reflux disease with esophagitis, without bleeding: Secondary | ICD-10-CM | POA: Diagnosis present

## 2021-09-28 DIAGNOSIS — I509 Heart failure, unspecified: Secondary | ICD-10-CM | POA: Diagnosis not present

## 2021-09-28 DIAGNOSIS — Z955 Presence of coronary angioplasty implant and graft: Secondary | ICD-10-CM

## 2021-09-28 DIAGNOSIS — Z8547 Personal history of malignant neoplasm of testis: Secondary | ICD-10-CM

## 2021-09-28 DIAGNOSIS — K921 Melena: Secondary | ICD-10-CM | POA: Diagnosis not present

## 2021-09-28 DIAGNOSIS — J8489 Other specified interstitial pulmonary diseases: Secondary | ICD-10-CM | POA: Diagnosis not present

## 2021-09-28 DIAGNOSIS — I252 Old myocardial infarction: Secondary | ICD-10-CM

## 2021-09-28 DIAGNOSIS — D649 Anemia, unspecified: Secondary | ICD-10-CM

## 2021-09-28 DIAGNOSIS — Z9079 Acquired absence of other genital organ(s): Secondary | ICD-10-CM

## 2021-09-28 DIAGNOSIS — Z792 Long term (current) use of antibiotics: Secondary | ICD-10-CM

## 2021-09-28 HISTORY — DX: Acute embolism and thrombosis of unspecified deep veins of unspecified lower extremity: I82.409

## 2021-09-28 HISTORY — DX: Interstitial emphysema: J98.2

## 2021-09-28 HISTORY — DX: Unspecified protein-calorie malnutrition: E46

## 2021-09-28 HISTORY — DX: Chronic respiratory failure, unspecified whether with hypoxia or hypercapnia: J96.10

## 2021-09-28 HISTORY — DX: Hypo-osmolality and hyponatremia: E87.1

## 2021-09-28 HISTORY — DX: Other specified interstitial pulmonary diseases: J84.89

## 2021-09-28 HISTORY — DX: Chronic systolic (congestive) heart failure: I50.22

## 2021-09-28 LAB — CBC
HCT: 23.2 % — ABNORMAL LOW (ref 39.0–52.0)
Hemoglobin: 6.5 g/dL — CL (ref 13.0–17.0)
MCH: 20.6 pg — ABNORMAL LOW (ref 26.0–34.0)
MCHC: 28 g/dL — ABNORMAL LOW (ref 30.0–36.0)
MCV: 73.7 fL — ABNORMAL LOW (ref 80.0–100.0)
Platelets: 468 10*3/uL — ABNORMAL HIGH (ref 150–400)
RBC: 3.15 MIL/uL — ABNORMAL LOW (ref 4.22–5.81)
RDW: 17.9 % — ABNORMAL HIGH (ref 11.5–15.5)
WBC: 12 10*3/uL — ABNORMAL HIGH (ref 4.0–10.5)
nRBC: 0.4 % — ABNORMAL HIGH (ref 0.0–0.2)

## 2021-09-28 LAB — IRON AND TIBC
Iron: 13 ug/dL — ABNORMAL LOW (ref 45–182)
Saturation Ratios: 4 % — ABNORMAL LOW (ref 17.9–39.5)
TIBC: 295 ug/dL (ref 250–450)
UIBC: 282 ug/dL

## 2021-09-28 LAB — COMPREHENSIVE METABOLIC PANEL
ALT: 21 U/L (ref 0–44)
AST: 17 U/L (ref 15–41)
Albumin: 3.1 g/dL — ABNORMAL LOW (ref 3.5–5.0)
Alkaline Phosphatase: 55 U/L (ref 38–126)
Anion gap: 8 (ref 5–15)
BUN: 15 mg/dL (ref 8–23)
CO2: 30 mmol/L (ref 22–32)
Calcium: 8.6 mg/dL — ABNORMAL LOW (ref 8.9–10.3)
Chloride: 93 mmol/L — ABNORMAL LOW (ref 98–111)
Creatinine, Ser: 0.6 mg/dL — ABNORMAL LOW (ref 0.61–1.24)
GFR, Estimated: >60 mL/min (ref >60)
Glucose, Bld: 105 mg/dL — ABNORMAL HIGH (ref 70–99)
Potassium: 4.8 mmol/L (ref 3.5–5.1)
Sodium: 131 mmol/L — ABNORMAL LOW (ref 135–145)
Total Bilirubin: 0.5 mg/dL (ref 0.3–1.2)
Total Protein: 6.2 g/dL — ABNORMAL LOW (ref 6.5–8.1)

## 2021-09-28 LAB — PREPARE RBC (CROSSMATCH)

## 2021-09-28 LAB — RETICULOCYTES
Immature Retic Fract: 28.8 % — ABNORMAL HIGH (ref 2.3–15.9)
RBC.: 3.18 MIL/uL — ABNORMAL LOW (ref 4.22–5.81)
Retic Count, Absolute: 110 10*3/uL (ref 19.0–186.0)
Retic Ct Pct: 3.5 % — ABNORMAL HIGH (ref 0.4–3.1)

## 2021-09-28 LAB — VITAMIN B12: Vitamin B-12: 180 pg/mL (ref 180–914)

## 2021-09-28 LAB — FOLATE: Folate: 15.1 ng/mL (ref >5.9)

## 2021-09-28 LAB — ABO/RH: ABO/RH(D): A NEG

## 2021-09-28 LAB — FERRITIN: Ferritin: 6 ng/mL — ABNORMAL LOW (ref 24–336)

## 2021-09-28 MED ORDER — SODIUM CHLORIDE 0.9 % IV SOLN: 250.0000 mL | INTRAVENOUS | Status: DC | PRN

## 2021-09-28 MED ORDER — SULFAMETHOXAZOLE-TRIMETHOPRIM 800-160 MG PO TABS: 1.0000 | ORAL_TABLET | ORAL | Status: DC

## 2021-09-28 MED ORDER — ONDANSETRON HCL 4 MG/2ML IJ SOLN: 4.0000 mg | Freq: Four times a day (QID) | INTRAMUSCULAR | Status: DC | PRN

## 2021-09-28 MED ORDER — HYDRALAZINE HCL 25 MG PO TABS: 25.0000 mg | ORAL_TABLET | Freq: Four times a day (QID) | ORAL | Status: DC | PRN

## 2021-09-28 MED ORDER — ACETAMINOPHEN 325 MG PO TABS: 650.0000 mg | ORAL_TABLET | ORAL | Status: DC | PRN

## 2021-09-28 MED ORDER — DAPAGLIFLOZIN PROPANEDIOL 10 MG PO TABS
10.0000 mg | ORAL_TABLET | Freq: Every day | ORAL | Status: DC
  Filled 2021-09-28: qty 1

## 2021-09-28 MED ORDER — SODIUM CHLORIDE 0.9 % IV SOLN: 10.0000 mL/h | Freq: Once | INTRAVENOUS | Status: DC

## 2021-09-28 MED ORDER — EZETIMIBE 10 MG PO TABS
10.0000 mg | ORAL_TABLET | Freq: Every day | ORAL | Status: DC
  Administered 2021-09-30 – 2021-10-01 (×2): 10 mg via ORAL
  Filled 2021-09-28 (×3): qty 1

## 2021-09-28 MED ORDER — SODIUM CHLORIDE 0.9% FLUSH
3.0000 mL | Freq: Two times a day (BID) | INTRAVENOUS | Status: DC
  Administered 2021-09-28 – 2021-10-01 (×6): 3 mL via INTRAVENOUS

## 2021-09-28 MED ORDER — PANTOPRAZOLE INFUSION (NEW) - SIMPLE MED
8.0000 mg/h | INTRAVENOUS | Status: DC
  Administered 2021-09-28 – 2021-09-29 (×4): 8 mg/h via INTRAVENOUS
  Filled 2021-09-28: qty 100
  Filled 2021-09-28 (×3): qty 80
  Filled 2021-09-28: qty 100
  Filled 2021-09-28: qty 80

## 2021-09-28 MED ORDER — SODIUM CHLORIDE 0.9% FLUSH
3.0000 mL | INTRAVENOUS | Status: DC | PRN
  Administered 2021-09-29: 3 mL via INTRAVENOUS

## 2021-09-28 MED ORDER — FERROUS SULFATE 300 (60 FE) MG/5ML PO SYRP
300.0000 mg | ORAL_SOLUTION | Freq: Every day | ORAL | Status: DC
  Filled 2021-09-28: qty 5

## 2021-09-28 MED ORDER — DORZOLAMIDE HCL-TIMOLOL MAL 2-0.5 % OP SOLN
1.0000 [drp] | Freq: Two times a day (BID) | OPHTHALMIC | Status: DC
  Administered 2021-09-28 – 2021-10-01 (×6): 1 [drp] via OPHTHALMIC
  Filled 2021-09-28: qty 10

## 2021-09-28 MED ORDER — LATANOPROST 0.005 % OP SOLN
1.0000 [drp] | Freq: Every day | OPHTHALMIC | Status: DC
  Administered 2021-09-28 – 2021-09-30 (×3): 1 [drp] via OPHTHALMIC
  Filled 2021-09-28: qty 2.5

## 2021-09-28 MED ORDER — TRIAMCINOLONE ACETONIDE 0.1 % EX CREA
1.0000 | TOPICAL_CREAM | Freq: Two times a day (BID) | CUTANEOUS | Status: DC | PRN
  Filled 2021-09-28: qty 15

## 2021-09-28 MED ORDER — PREDNISONE 5 MG PO TABS
15.0000 mg | ORAL_TABLET | Freq: Every day | ORAL | Status: DC
  Administered 2021-09-30 – 2021-10-01 (×2): 15 mg via ORAL
  Filled 2021-09-28 (×3): qty 1

## 2021-09-28 MED ORDER — SODIUM CHLORIDE 0.9 % IV SOLN
500.0000 mg | Freq: Once | INTRAVENOUS | Status: AC
  Administered 2021-09-29: 500 mg via INTRAVENOUS
  Filled 2021-09-28: qty 25

## 2021-09-28 MED ORDER — BRIMONIDINE TARTRATE 0.2 % OP SOLN
1.0000 [drp] | Freq: Two times a day (BID) | OPHTHALMIC | Status: DC
  Administered 2021-09-28 – 2021-10-01 (×6): 1 [drp] via OPHTHALMIC
  Filled 2021-09-28 (×2): qty 5

## 2021-09-28 MED ORDER — PANTOPRAZOLE 80MG IVPB - SIMPLE MED
80.0000 mg | Freq: Once | INTRAVENOUS | Status: AC
  Administered 2021-09-28: 80 mg via INTRAVENOUS
  Filled 2021-09-28: qty 80

## 2021-09-28 MED ORDER — ACETAMINOPHEN 500 MG PO TABS: 1000.0000 mg | ORAL_TABLET | Freq: Four times a day (QID) | ORAL | Status: DC | PRN

## 2021-09-28 MED ORDER — MAGIC MOUTHWASH: 5.0000 mL | Freq: Three times a day (TID) | ORAL | Status: DC | PRN

## 2021-09-28 MED ORDER — NYSTATIN 100000 UNIT/ML MT SUSP: 5.0000 mL | Freq: Three times a day (TID) | OROMUCOSAL | Status: DC | PRN

## 2021-09-28 MED ORDER — ASPIRIN 81 MG PO TBEC
81.0000 mg | DELAYED_RELEASE_TABLET | Freq: Every day | ORAL | Status: DC
  Administered 2021-09-30 – 2021-10-01 (×2): 81 mg via ORAL
  Filled 2021-09-28 (×3): qty 1

## 2021-09-28 MED ORDER — CARVEDILOL 3.125 MG PO TABS
3.1250 mg | ORAL_TABLET | Freq: Two times a day (BID) | ORAL | Status: DC
  Administered 2021-09-28 – 2021-10-01 (×4): 3.125 mg via ORAL
  Filled 2021-09-28 (×6): qty 1

## 2021-09-28 MED ORDER — ENSURE ENLIVE PO LIQD: 237.0000 mL | Freq: Two times a day (BID) | ORAL | Status: DC

## 2021-09-28 NOTE — ED Notes (Signed)
Orthostatic vitals done while resident MD in the room. Patient began breathing hard while obtaining BP. Pt c/o SOB while standing. RN and EDP are aware.

## 2021-09-28 NOTE — ED Notes (Signed)
EDP at bedside  

## 2021-09-28 NOTE — ED Provider Triage Note (Signed)
Emergency Medicine Provider Triage Evaluation Note  Roberto Ponce , a 71 y.o. male  was evaluated in triage.  Pt complains of shortness of breath and abnormal labs. Had labs checked at PCP and had hemoglobin of 6. Is on home O2 as needed but has needed it more frequently this week. Was hospitalized in May for CHF and pneumonia. Currently on eliquis for prior DVT. Hx of low iron  Review of Systems  Positive: Fatigue, SOB, leg swelling Negative: CP, bloody stools  Physical Exam  BP 105/65 (BP Location: Right Arm)   Pulse 95   Temp 98.9 F (37.2 C) (Oral)   Resp (!) 24   SpO2 100%  Gen:   Awake, no distress   Resp:  Normal effort  MSK:   Moves extremities without difficulty  Other:  Pale, 2-3 sec cap refill  Medical Decision Making  Medically screening exam initiated at 2:10 PM.  Appropriate orders placed.  Roberto Ponce was informed that the remainder of the evaluation will be completed by another provider, this initial triage assessment does not replace that evaluation, and the importance of remaining in the ED until their evaluation is complete.  Initiated lab workup including anemia panel   Janika Jedlicka T, PA-C 09/28/21 1412

## 2021-09-28 NOTE — ED Notes (Signed)
6N RN states that are ready for the pt

## 2021-09-28 NOTE — ED Triage Notes (Signed)
Pt sent by PCP for hgb of 6 per report.  Pt does have shob. Is on home O2 as needed but has needed it all the time this week

## 2021-09-28 NOTE — H&P (Signed)
History and Physical    Roberto SCHERTZER Ponce:[id number] DOB: [birth year] DOA: 09/28/2021  PCP: Kristen Loader, FNP (Confirm with patient/family/NH records and if not entered, this has to be entered at [facility] point of entry) Patient coming from: Home  I have personally briefly reviewed patient's old medical records in [place]  Chief Complaint: Feeling weak  HPI: Roberto Ponce is a 71 y.o. 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  Patient was recently started on Eliquis for RLE DVT found on last admission in May 2023.  Patient has history of diverticulosis diagnosed with routine screening colonoscopy back in 2017 but never had diverticulosis bleeding or diverticulitis before.  Patient was doing okay with the Eliquis denies any abdominal pain no blood in the stool no dark-colored stool.  However he does feel increasing lethargy and went to see PCP today who did a blood work showed hemoglobin= 6.1 and sent to ED.  No chest pain no cough.  Patient has BOOP of the left lung and recently completed Bactrim treatment for 3 weeks and still on prednisone tapering and now is on 50 mg daily.  ED Course: Blood pressure borderline low, respiratory distress and stabilized on 5 L oxygen.  Hemoglobin= 6.5, iron saturation 4%.  Review of Systems: As per HPI otherwise 14 point review of systems negative.    Past Medical History:  Diagnosis Date   Asthma    Coronary artery disease    Glaucoma    History of gallstones    Hyperlipidemia    Hypotension due to medication 06/22/2021   LVH (left  ventricular hypertrophy)    MI (myocardial infarction) ([place])    MI, old    Mild aortic sclerosis    Mitral regurgitation    Mixed dyslipidemia    Shock ([place]) 06/22/2021   Venous stasis     Past Surgical History:  Procedure Laterality Date   CARDIAC CATHETERIZATION  09/09/1986   EF 56%   CARDIOVASCULAR STRESS TEST  12/16/2007   EF 54% NO ISCHEMIA   CHOLECYSTECTOMY     CORONARY ARTERY BYPASS GRAFT     LIMA TO THE LAD AND RIMA TO RIGHT CORONARY ARTERY   CORONARY STENT INTERVENTION N/A 02/14/2021   Procedure: CORONARY STENT INTERVENTION;  Surgeon: Sherren Mocha, MD;  Location: [place] CV LAB;  Service: Cardiovascular;  Laterality: N/A;   ORCHIECTOMY Right 04/19/2021   Procedure: RIGHT RADICAL INGUINAL ORCHIECTOMY;  Surgeon: Lucas Mallow, MD;  Location: [facility] ORS;  Service: Urology;  Laterality: Right;  1 HR FOR CASE   RIGHT/LEFT HEART CATH AND CORONARY/GRAFT ANGIOGRAPHY N/A 02/14/2021   Procedure: RIGHT/LEFT HEART CATH AND CORONARY/GRAFT ANGIOGRAPHY;  Surgeon: Sherren Mocha, MD;  Location: [place] CV LAB;  Service: Cardiovascular;  Laterality: N/A;   TEE WITHOUT CARDIOVERSION N/A 09/02/2020   Procedure: TRANSESOPHAGEAL ECHOCARDIOGRAM (TEE);  Surgeon: Werner Lean, MD;  Location: [facility] ENDOSCOPY;  Service: Cardiovascular;  Laterality: N/A;   US ECHOCARDIOGRAPHY  12/12/2007   EF 55-60%   VASECTOMY  1988     reports that he has never smoked. He has never used smokeless tobacco. He reports that he does not drink alcohol and does not use drugs.  No Known Allergies  Family History  Problem Relation Age of Onset   Heart attack Father    Stroke Father      Prior to Admission medications   Medication Sig Start Date End Date Taking? Authorizing Provider  acetaminophen (TYLENOL) 500 MG tablet Take 1,000 mg by mouth every 6 (six) hours as needed for moderate pain.    [provider]  apixaban (ELIQUIS) 5 MG TABS tablet Take 1 tablet (5 mg total) by mouth 2  (two) times daily. 07/12/21   Mercy Riding, MD  aspirin EC 81 MG tablet Take 1 tablet (81 mg total) by mouth daily. Swallow whole. 08/23/21   Sherren Mocha, MD  brimonidine [facility]) 0.2 % ophthalmic solution Place 1 drop into the left eye 2 (two) times daily. 12/19/20   [provider]  carvedilol (COREG) 3.125 MG tablet Take 1 tablet (3.125 mg total) by mouth 2 (two) times daily with a meal. 06/28/21   Oswald Hillock, MD  dapagliflozin propanediol (FARXIGA) 10 MG TABS tablet Take 1 tablet (10 mg total) by mouth daily. 06/29/21   Oswald Hillock, MD  dorzolamide-timolol (COSOPT) 22.3-6.8 MG/ML ophthalmic solution Place 1 drop into both eyes 2 (two) times daily.    [provider]  ezetimibe (ZETIA) 10 MG tablet TAKE 1 TABLET(10 MG) BY MOUTH DAILY 04/16/19   Nahser, Wonda Cheng, MD  feeding supplement (ENSURE ENLIVE / ENSURE PLUS) LIQD Take 237 mLs by mouth 2 (two) times daily between meals. 07/12/21   Mercy Riding, MD  furosemide (LASIX) 40 MG tablet Take 1 tablet by mouth on Mondays, Wednesdays, and Fridays only 08/25/21   Nahser, Wonda Cheng, MD  latanoprost (XALATAN) 0.005 % ophthalmic solution Place 1 drop into both eyes at bedtime.  05/27/13   [provider]  magic mouthwash (nystatin, lidocaine, diphenhydrAMINE) suspension Take 5 mLs by mouth 3 (three) times daily as needed for mouth pain. 09/01/21   Freddi Starr, MD  nitroGLYCERIN (NITROSTAT) 0.4 MG SL tablet Place 1 tablet (0.4 mg total) under the tongue every 5 (five) minutes as needed for chest pain. 02/15/21 02/15/22  Sande Rives E, PA-C  SODIUM FLUORIDE 5000 PPM 1.1 % PSTE Apply 1 application. topically in the morning and at bedtime. 05/17/21   [provider]  spironolactone (ALDACTONE) 25 MG tablet Take 0.5 tablets (12.5 mg total) by mouth daily. 06/29/21   Oswald Hillock, MD  sulfamethoxazole-trimethoprim (BACTRIM DS) 800-160 MG tablet Take 1 tablet by mouth 3 (three) times a week. 07/28/21   Freddi Starr, MD  triamcinolone cream (KENALOG) 0.1 % Apply 1 application topically 2 (two) times daily as needed (eczema).    [provider]    Physical Exam: Vitals:   09/28/21 1815 09/28/21 1830 09/28/21 1900 09/28/21 1909  BP: (!) 97/54 (!) 94/59 102/63 102/63  Pulse: 88 85 88 84  Resp: 20 (!) 25 (!) 21 (!) 22  Temp:    97.9 F (36.6 C)  TempSrc:    Oral  SpO2: 100% 100% 100% 100%    Constitutional: NAD, calm, comfortable Vitals:   09/28/21 1815 09/28/21 1830 09/28/21 1900 09/28/21 1909  BP: (!) 97/54 (!) 94/59 102/63 102/63  Pulse: 88 85 88 84  Resp: 20 (!) 25 (!) 21 (!) 22  Temp:    97.9 F (36.6 C)  TempSrc:    Oral  SpO2: 100% 100% 100% 100%   Eyes: PERRL, lids and conjunctivae normal ENMT: Mucous membranes are moist. Posterior  pharynx clear of any exudate or lesions.Normal dentition.  Neck: normal, supple, no masses, no thyromegaly Respiratory: clear to auscultation bilaterally, no wheezing, diffused coarse crackles on left lung.  Increasing respiratory effort. No accessory muscle use.  Cardiovascular: Regular rate and rhythm, diastolic murmur on apex. No extremity edema. 2+ pedal pulses. No carotid bruits.  Abdomen: no tenderness, no masses palpated. No hepatosplenomegaly. Bowel sounds positive.  Musculoskeletal: no clubbing / cyanosis. No joint deformity upper and lower extremities. Good ROM, no contractures. Normal muscle tone.  Skin: no rashes, lesions, ulcers. No induration Neurologic: CN 2-12 grossly intact. Sensation intact, DTR normal. Strength 5/5 in all 4.  Psychiatric: Normal judgment and insight. Alert and oriented x 3. Normal mood.   (  Labs on Admission: I have personally reviewed following labs and imaging studies  CBC: Recent Labs  Lab 09/28/21 1420  WBC 12.0*  HGB 6.5*  HCT 23.2*  MCV 73.7*  PLT 322*   Basic Metabolic Panel: Recent Labs  Lab 09/28/21 1420  NA 131*  K 4.8  CL 93*  CO2 30  GLUCOSE 105*  BUN 15  CREATININE 0.60*   CALCIUM 8.6*   GFR: CrCl cannot be calculated (Unknown ideal weight.). Liver Function Tests: Recent Labs  Lab 09/28/21 1420  AST 17  ALT 21  ALKPHOS 55  BILITOT 0.5  PROT 6.2*  ALBUMIN 3.1*   No results for input(s): "LIPASE", "AMYLASE" in the last 168 hours. No results for input(s): "AMMONIA" in the last 168 hours. Coagulation Profile: No results for input(s): "INR", "PROTIME" in the last 168 hours. Cardiac Enzymes: No results for input(s): "CKTOTAL", "CKMB", "CKMBINDEX", "TROPONINI" in the last 168 hours. BNP (last 3 results) No results for input(s): "PROBNP" in the last 8760 hours. HbA1C: No results for input(s): "HGBA1C" in the last 72 hours. CBG: No results for input(s): "GLUCAP" in the last 168 hours. Lipid Profile: No results for input(s): "CHOL", "HDL", "LDLCALC", "TRIG", "CHOLHDL", "LDLDIRECT" in the last 72 hours. Thyroid Function Tests: No results for input(s): "TSH", "T4TOTAL", "FREET4", "T3FREE", "THYROIDAB" in the last 72 hours. Anemia Panel: Recent Labs    09/28/21 1420  VITAMINB12 180  FOLATE 15.1  FERRITIN 6*  TIBC 295  IRON 13*  RETICCTPCT 3.5*   Urine analysis:    Component Value Date/Time   COLORURINE STRAW (A) 07/05/2021 0043   APPEARANCEUR CLEAR 07/05/2021 0043   LABSPEC 1.005 07/05/2021 0043   PHURINE 7.0 07/05/2021 0043   GLUCOSEU 50 (A) 07/05/2021 0043   HGBUR SMALL (A) 07/05/2021 0043   BILIRUBINUR NEGATIVE 07/05/2021 [place] 07/05/2021 0043   PROTEINUR NEGATIVE 07/05/2021 0043   NITRITE NEGATIVE 07/05/2021 0043   LEUKOCYTESUR NEGATIVE 07/05/2021 0043    Radiological Exams on Admission: DG Chest Portable 1 View  Result Date: 09/28/2021 CLINICAL DATA:  Tachypnea, shortness of breath EXAM: PORTABLE CHEST 1 VIEW COMPARISON:  09/01/2021 FINDINGS: Diffuse airspace disease throughout the left lung compatible with fibrotic changes seen on prior CT and plain films, unchanged. No confluent opacity on the right. Prior  median sternotomy and CABG. Heart is normal size. Aortic atherosclerosis. No acute bony abnormality. IMPRESSION: Stable chronic fibrotic changes most pronounced in the left lung. No acute cardiopulmonary disease. Electronically Signed   By: Rolm Baptise M.D.   On: 09/28/2021 17:29    EKG: Independently reviewed.  Sinus, prolonged QTc 546.  Assessment/Plan Principal Problem:   GI bleed Active Problems:   Symptomatic anemia  (please populate well all problems here in Problem List. (For  example, if patient is on BP meds at home and you resume or decide to hold them, it is a problem that needs to be her. Same for CAD, COPD, HLD and so on)  Acute on chronic iron deficiency anemia, symptomatic -Likely has a slow undetected GI bleed. -PPI BID -PRBC x1 -Echo GI consulted -Venofer x 1 tomorrow, and start p.o. iron supplement tomorrow.  Recent DVT -Long discussion with patient and his wife at bedside regarding solution to treat DVT.  As last admission discharge summary considered his DVT is provoked by testicle or cancer however patient underwent orchiectomy in March and was cleared by oncology afterwards that " everything was removed". -Acutely, Eliquis contraindicated given acute decompensated anemia, however whether patient still in hypercoagulable state is unknown at this point.  Recommend outpatient follow-up with his oncology to discuss indication for long-term anticoagulation. -Also discussed with patient regarding option of IVC filter.  Patient and wife however concerned about increasing leg swelling and whether IVC filter compatible with his heart conditions, family request Dr. Gasper Sells be consulted on the issue. -For now, will check DVT study again, while waiting for GI workup result.  Chronic HFrEF -Stable, clinically appears to be euvolemic -Continue 3 times a week Lasix.  HTN -Blood pressure borderline low, hold off home BP meds, start as needed hydralazine  Left lung  BOOP -Off antibiotics, continue steroid tapering  Chronic hyponatremia -Stable, euvolemic  Severe to moderate MR -Cardiology follow-up outpatient  DVT prophylaxis: SCD Code Status: Full code Family Communication: Wife at bedside Disposition Plan: Patient sick with GI bleed symptomatic anemia while on Eliquis, requiring inpatient GI workup and transfusion, expect more than 2 midnight hospital stay. Consults called: GI Admission status: Telemetry admission   Lequita Halt MD [facility] Pager [phone]  09/28/2021, 7:22 PM

## 2021-09-28 NOTE — Telephone Encounter (Signed)
Pt currently in the hospital 

## 2021-09-28 NOTE — ED Provider Notes (Signed)
[facility] EMERGENCY DEPARTMENT Provider Note   CSN: [id number] Arrival date & time: 09/28/21  1319     History  No chief complaint on file.  HPI Roberto Ponce is a 71 y.o. male PMH acute MI s/p CABG, LVH, CAD, CHF, DVT, testicular cancer, SIADH who is presenting with abnormal lab value.  Wife is at bedside, contributes to history.  They report that for the last 1 to 2 weeks, patient has been experiencing worsening shortness of breath.  The shortness of breath is primarily upon exertion, such as when the patient stands up.  Patient normally wears 1 to 2 L nasal cannula at home, though he has been trying to turn it off while he is sitting down/resting.  Patient denies any fever, cough, congestion, sore throat.  Patient denies any chest pain.  Patient denies any nausea, vomiting, abdominal pain, diarrhea.  Patient denies any dark, tarry stools, bright red blood per rectum.  Patient and wife have noted some swelling in the patient's feet and ankles.  They weigh the patient every morning and every night, and have not noticed a significant increase in his weight.    Home Medications Prior to Admission medications   Medication Sig Start Date End Date Taking? Authorizing Provider  acetaminophen (TYLENOL) 500 MG tablet Take 1,000 mg by mouth every 6 (six) hours as needed for moderate pain.    [provider]  apixaban (ELIQUIS) 5 MG TABS tablet Take 1 tablet (5 mg total) by mouth 2 (two) times daily. 07/12/21   Mercy Riding, MD  aspirin EC 81 MG tablet Take 1 tablet (81 mg total) by mouth daily. Swallow whole. 08/23/21   Sherren Mocha, MD  brimonidine [facility]) 0.2 % ophthalmic solution Place 1 drop into the left eye 2 (two) times daily. 12/19/20   [provider]  carvedilol (COREG) 3.125 MG tablet Take 1 tablet (3.125 mg total) by mouth 2 (two) times daily with a meal. 06/28/21   Oswald Hillock, MD  dapagliflozin propanediol (FARXIGA) 10 MG TABS tablet Take  1 tablet (10 mg total) by mouth daily. 06/29/21   Oswald Hillock, MD  dorzolamide-timolol (COSOPT) 22.3-6.8 MG/ML ophthalmic solution Place 1 drop into both eyes 2 (two) times daily.    [provider]  ezetimibe (ZETIA) 10 MG tablet TAKE 1 TABLET(10 MG) BY MOUTH DAILY 04/16/19   Nahser, Wonda Cheng, MD  feeding supplement (ENSURE ENLIVE / ENSURE PLUS) LIQD Take 237 mLs by mouth 2 (two) times daily between meals. 07/12/21   Mercy Riding, MD  furosemide (LASIX) 40 MG tablet Take 1 tablet by mouth on Mondays, Wednesdays, and Fridays only 08/25/21   Nahser, Wonda Cheng, MD  latanoprost (XALATAN) 0.005 % ophthalmic solution Place 1 drop into both eyes at bedtime.  05/27/13   [provider]  magic mouthwash (nystatin, lidocaine, diphenhydrAMINE) suspension Take 5 mLs by mouth 3 (three) times daily as needed for mouth pain. 09/01/21   Freddi Starr, MD  nitroGLYCERIN (NITROSTAT) 0.4 MG SL tablet Place 1 tablet (0.4 mg total) under the tongue every 5 (five) minutes as needed for chest pain. 02/15/21 02/15/22  Sande Rives E, PA-C  SODIUM FLUORIDE 5000 PPM 1.1 % PSTE Apply 1 application. topically in the morning and at bedtime. 05/17/21   [provider]  spironolactone (ALDACTONE) 25 MG tablet Take 0.5 tablets (12.5 mg total) by mouth daily. 06/29/21   Oswald Hillock, MD  sulfamethoxazole-trimethoprim (BACTRIM DS) 800-160 MG tablet Take  1 tablet by mouth 3 (three) times a week. 07/28/21   Freddi Starr, MD  triamcinolone cream (KENALOG) 0.1 % Apply 1 application topically 2 (two) times daily as needed (eczema).    [provider]      Allergies    Patient has no known allergies.    Review of Systems   Review of Systems As in HPI.  Physical Exam Updated Vital Signs BP 105/65 (BP Location: Right Arm)   Pulse 95   Temp 98.9 F (37.2 C) (Oral)   Resp (!) 24   SpO2 100%   Physical Exam Vitals and nursing note reviewed.  Constitutional:      General: He is not in  acute distress.    Appearance: Normal appearance. He is well-developed.     Comments: Elderly, thin appearing male laying in bed with nasal cannula in place.  HENT:     Head: Normocephalic and atraumatic.     Nose: Nose normal.  Cardiovascular:     Rate and Rhythm: Normal rate and regular rhythm.     Heart sounds: No murmur heard. Pulmonary:     Effort: Tachypnea and prolonged expiration present. No respiratory distress or retractions.     Breath sounds: Decreased air movement present. No wheezing or rhonchi.     Comments: Diminished breath sounds heard in the left lung field compared to right. Abdominal:     Palpations: Abdomen is soft.     Tenderness: There is no abdominal tenderness.  Genitourinary:    Rectum: Normal. Guaiac result positive.     Comments: Stool not grossly bloody or black in color.  Musculoskeletal:        General: Swelling present.     Cervical back: Neck supple.     Right lower leg: Edema present.     Left lower leg: Edema present.     Comments: Pitting bilateral lower extremity edema to the level of the knees.  Skin:    General: Skin is warm and dry.  Neurological:     Mental Status: He is alert.     ED Results / Procedures / Treatments   Labs (all labs ordered are listed, but only abnormal results are displayed) Labs Reviewed  COMPREHENSIVE METABOLIC PANEL - Abnormal; Notable for the following components:      Result Value   Sodium 131 (*)    Chloride 93 (*)    Glucose, Bld 105 (*)    Creatinine, Ser 0.60 (*)    Calcium 8.6 (*)    Total Protein 6.2 (*)    Albumin 3.1 (*)    All other components within normal limits  CBC - Abnormal; Notable for the following components:   WBC 12.0 (*)    RBC 3.15 (*)    Hemoglobin 6.5 (*)    HCT 23.2 (*)    MCV 73.7 (*)    MCH 20.6 (*)    MCHC 28.0 (*)    RDW 17.9 (*)    Platelets 468 (*)    nRBC 0.4 (*)    All other components within normal limits  RETICULOCYTES - Abnormal; Notable for the following  components:   Retic Ct Pct 3.5 (*)    RBC. 3.18 (*)    Immature Retic Fract 28.8 (*)    All other components within normal limits  VITAMIN B12  FOLATE  IRON AND TIBC  FERRITIN  POC OCCULT BLOOD, ED  TYPE AND SCREEN  ABO/RH    EKG EKG Interpretation  Date/Time:  Thursday September 28 2021 15:59:55 EDT Ventricular Rate:  84 PR Interval:  122 QRS Duration: 143 QT Interval:  446 QTC Calculation: 546 R Axis:   53 Text Interpretation: Sinus rhythm Multiple premature complexes, vent & supraven Probable left atrial enlargement Nonspecific intraventricular conduction delay Lateral infarct, acute (LAD) No significant change since last tracing Confirmed by Wandra Arthurs [phone]) on 09/28/2021 4:11:51 PM  Radiology DG Chest Portable 1 View  Result Date: 09/28/2021 CLINICAL DATA:  Tachypnea, shortness of breath EXAM: PORTABLE CHEST 1 VIEW COMPARISON:  09/01/2021 FINDINGS: Diffuse airspace disease throughout the left lung compatible with fibrotic changes seen on prior CT and plain films, unchanged. No confluent opacity on the right. Prior median sternotomy and CABG. Heart is normal size. Aortic atherosclerosis. No acute bony abnormality. IMPRESSION: Stable chronic fibrotic changes most pronounced in the left lung. No acute cardiopulmonary disease. Electronically Signed   By: Rolm Baptise M.D.   On: 09/28/2021 17:29    Procedures Procedures   Medications Ordered in ED Medications - No data to display  ED Course/ Medical Decision Making/ A&P    Medical Decision Making Amount and/or Complexity of Data Reviewed Labs: ordered. Radiology: ordered.  Risk Prescription drug management.   KASYN ROLPH is a 71 y.o. male PMH acute MI s/p CABG, LVH, CAD, CHF, DVT, testicular cancer, SIADH who is presenting with abnormal lab value.   Vitals at presentation notable for tachypnea.  Patient is hemodynamically stable, afebrile, but requires supplemental O2 to 6L [place] (significantly raised from home  O2 requirement of 1-2L).  Physical exam notable for guaiac positive stools, pitting bilateral lower extremity edema, increased work of breathing with tachypnea and prolonged expiration with pursued lips.  Normal heart sounds.  Abdomen is soft, nontender to palpation.    Initial differential includes but is not limited to: anemia 2'/2 GI losses, chronic disease, inappropriate RBC production  Lab work obtained, resulted notable for CMP without evidence of AKI, elevated LFTs, or anion gap.  Sodium, chloride mildly low, but improved from prior.  CBC with leukocytosis 12, which is downtrending from prior labs, and hemoglobin of 6.5.  Reticulocytes percentage is elevated, likely in the setting of appropriate bone marrow response to anemia.  Iron is low, in accordance to the patient's known history of iron deficiency anemia.  POC occult blood is positive.   Chest x-ray obtained, per radiology read notable for "stable chronic fibrotic changes most pronounced in the left lung".  EKG obtained, demonstrates sinus rhythm, ventricular rate 84 bpm.  No evidence of acute ischemia.  Qtc mildly prolonged 566m. Interpreted by myself and my attending.  Interventions include IV protonix, 1U pRBCs  We will admit the patient to hospitalist for additional evaluation of his anemia with increased O2 requirement.  The plan for this patient was discussed with Dr. YDarl Householder who voiced agreement and who oversaw evaluation and treatment of this patient.    Final Clinical Impression(s) / ED Diagnoses Final diagnoses:  Guaiac positive stools  Anemia, unspecified type    Rx / DC Orders ED Discharge Orders     None         RFaylene Million MD 09/28/21 2350    YDrenda Freeze MD 09/29/21 [phone]

## 2021-09-28 NOTE — Telephone Encounter (Addendum)
The patient reports he had lab work drawn at his PCP and they called him today with results. He was told his Hgb=6.2 and he was instructed to go to the ER. He called to see if he needed to go to the ER or if he could wait until he sees Dr. Gasper Sells later this afternoon.  He stated he has no obvious bleeding and he denied CP but continues to have increased DOE.  Instructed the patient to go to the ER for evaluation of low hgb and increased SOB because he will need blood work confirmation and to have potential treatments that are not available in the office.   He was grateful for assistance.  Will cancel Dr. Oralia Rud visit this afternoon and send to him/his nurse for follow-up.

## 2021-09-29 ENCOUNTER — Encounter (HOSPITAL_COMMUNITY): Payer: Self-pay | Admitting: Internal Medicine

## 2021-09-29 ENCOUNTER — Inpatient Hospital Stay (HOSPITAL_COMMUNITY): Payer: PPO

## 2021-09-29 DIAGNOSIS — D649 Anemia, unspecified: Secondary | ICD-10-CM

## 2021-09-29 DIAGNOSIS — M7989 Other specified soft tissue disorders: Secondary | ICD-10-CM

## 2021-09-29 DIAGNOSIS — Z86718 Personal history of other venous thrombosis and embolism: Secondary | ICD-10-CM

## 2021-09-29 DIAGNOSIS — I5022 Chronic systolic (congestive) heart failure: Secondary | ICD-10-CM

## 2021-09-29 DIAGNOSIS — J8489 Other specified interstitial pulmonary diseases: Secondary | ICD-10-CM | POA: Diagnosis not present

## 2021-09-29 DIAGNOSIS — K922 Gastrointestinal hemorrhage, unspecified: Secondary | ICD-10-CM | POA: Diagnosis not present

## 2021-09-29 DIAGNOSIS — J9611 Chronic respiratory failure with hypoxia: Secondary | ICD-10-CM

## 2021-09-29 DIAGNOSIS — I340 Nonrheumatic mitral (valve) insufficiency: Secondary | ICD-10-CM

## 2021-09-29 LAB — TYPE AND SCREEN
ABO/RH(D): A NEG
Antibody Screen: NEGATIVE
Unit division: 0

## 2021-09-29 LAB — OCCULT BLOOD, POC DEVICE: Fecal Occult Bld: POSITIVE — AB

## 2021-09-29 LAB — BPAM RBC
Blood Product Expiration Date: 202308222359
ISSUE DATE / TIME: 202308101900
Unit Type and Rh: 600

## 2021-09-29 LAB — CBC
HCT: 26.1 % — ABNORMAL LOW (ref 39.0–52.0)
Hemoglobin: 7.7 g/dL — ABNORMAL LOW (ref 13.0–17.0)
MCH: 22.4 pg — ABNORMAL LOW (ref 26.0–34.0)
MCHC: 29.5 g/dL — ABNORMAL LOW (ref 30.0–36.0)
MCV: 75.9 fL — ABNORMAL LOW (ref 80.0–100.0)
Platelets: 357 10*3/uL (ref 150–400)
RBC: 3.44 MIL/uL — ABNORMAL LOW (ref 4.22–5.81)
RDW: 19.3 % — ABNORMAL HIGH (ref 11.5–15.5)
WBC: 8 10*3/uL (ref 4.0–10.5)
nRBC: 0.5 % — ABNORMAL HIGH (ref 0.0–0.2)

## 2021-09-29 MED ORDER — PEG-KCL-NACL-NASULF-NA ASC-C 100 G PO SOLR
1.0000 | Freq: Once | ORAL | Status: DC
Start: 2021-09-29 — End: 2021-09-29

## 2021-09-29 MED ORDER — ADULT MULTIVITAMIN W/MINERALS CH
1.0000 | ORAL_TABLET | Freq: Every day | ORAL | Status: DC
  Administered 2021-09-29 – 2021-10-01 (×3): 1 via ORAL
  Filled 2021-09-29 (×3): qty 1

## 2021-09-29 MED ORDER — ENSURE ENLIVE PO LIQD
237.0000 mL | Freq: Three times a day (TID) | ORAL | Status: DC
  Administered 2021-09-30 – 2021-10-01 (×3): 237 mL via ORAL

## 2021-09-29 MED ORDER — BISACODYL 10 MG RE SUPP: 10.0000 mg | Freq: Once | RECTAL | Status: DC

## 2021-09-29 MED ORDER — VITAMIN B-12 1000 MCG PO TABS
1000.0000 ug | ORAL_TABLET | Freq: Every day | ORAL | Status: DC
  Administered 2021-09-29 – 2021-10-01 (×3): 1000 ug via ORAL
  Filled 2021-09-29 (×3): qty 1

## 2021-09-29 MED ORDER — PEG-KCL-NACL-NASULF-NA ASC-C 100 G PO SOLR
0.5000 | Freq: Once | ORAL | Status: AC
  Administered 2021-09-30: 100 g via ORAL
  Filled 2021-09-29: qty 1

## 2021-09-29 MED ORDER — PEG-KCL-NACL-NASULF-NA ASC-C 100 G PO SOLR
0.5000 | Freq: Once | ORAL | Status: AC
  Administered 2021-09-29: 100 g via ORAL
  Filled 2021-09-29: qty 1

## 2021-09-29 NOTE — Progress Notes (Signed)
Carvedilol 3.125 mg tablet telephone order by MD Mansy.

## 2021-09-29 NOTE — Progress Notes (Signed)
Bilateral LE venous duplex study completed. Please see CV Proc for preliminary results.  Chayla Shands BS, RVT 09/29/2021 11:32 AM

## 2021-09-29 NOTE — Consult Note (Signed)
[facility] Gastroenterology Consultation Note  Referring Provider: No ref. provider found Primary Care Physician:  Kristen Loader, FNP Primary Gastroenterologist:  Sadie Haber (Dr. Penelope Coop)  Reason for Consultation:  anemia, hemoccult-positive stools  HPI: Roberto Ponce is a 71 y.o. male presenting worsening weakness.  Breathing improving, near baseline.  No hematemesis, melena, hematochezia, abdominal pain, change in bowel habits.  No prior endoscopy.  Last colonoscopy 2018 by Dr. Penelope Coop showed pancolonic diverticulosis otherwise unrevealing.   Past Medical History:  Diagnosis Date   Asthma    BOOP (bronchiolitis obliterans with organizing pneumonia) ([place])    Chronic respiratory failure ([place])    Coronary artery disease    DVT (deep venous thrombosis) (H[facility])    Glaucoma    Heart failure with mid-range ejection fraction ([place])    History of gallstones    Hyperlipidemia    Hyponatremia    Hypotension due to medication 06/22/2021   LVH (left ventricular hypertrophy)    MI (myocardial infarction) ([place])    Mitral regurgitation    Mixed dyslipidemia    Pneumomediastinum (HCC)    Protein calorie malnutrition ([place])    Shock ([place]) 06/22/2021   Venous stasis     Past Surgical History:  Procedure Laterality Date   CARDIAC CATHETERIZATION  09/09/1986   EF 56%   CARDIOVASCULAR STRESS TEST  12/16/2007   EF 54% NO ISCHEMIA   CHOLECYSTECTOMY     CORONARY ARTERY BYPASS GRAFT     LIMA TO THE LAD AND RIMA TO RIGHT CORONARY ARTERY   CORONARY STENT INTERVENTION N/A 02/14/2021   Procedure: CORONARY STENT INTERVENTION;  Surgeon: Sherren Mocha, MD;  Location: [place] [facility];  Service: Cardiovascular;  Laterality: N/A;   ORCHIECTOMY Right 04/19/2021   Procedure: RIGHT RADICAL INGUINAL ORCHIECTOMY;  Surgeon: Lucas Mallow, MD;  Location: [facility];  Service: Urology;  Laterality: Right;  1 HR FOR CASE   RIGHT/LEFT HEART CATH AND CORONARY/GRAFT ANGIOGRAPHY N/A 02/14/2021   Procedure: RIGHT/LEFT  HEART CATH AND CORONARY/GRAFT ANGIOGRAPHY;  Surgeon: Sherren Mocha, MD;  Location: [place] [facility];  Service: Cardiovascular;  Laterality: N/A;   TEE WITHOUT CARDIOVERSION N/A 09/02/2020   Procedure: TRANSESOPHAGEAL ECHOCARDIOGRAM (TEE);  Surgeon: Werner Lean, MD;  Location: [facility] ENDOSCOPY;  Service: Cardiovascular;  Laterality: N/A;   US ECHOCARDIOGRAPHY  12/12/2007   EF 55-60%   VASECTOMY  1988    Prior to Admission medications   Medication Sig Start Date End Date Taking? Authorizing Provider  acetaminophen (TYLENOL) 500 MG tablet Take 1,000 mg by mouth every 6 (six) hours as needed for moderate pain.   Yes [provider]  apixaban (ELIQUIS) 5 MG TABS tablet Take 1 tablet (5 mg total) by mouth 2 (two) times daily. 07/12/21  Yes Mercy Riding, MD  aspirin EC 81 MG tablet Take 1 tablet (81 mg total) by mouth daily. Swallow whole. 08/23/21  Yes Sherren Mocha, MD  brimonidine [facility]) 0.2 % ophthalmic solution Place 1 drop into the left eye 2 (two) times daily. 12/19/20  Yes [provider]  carvedilol (COREG) 3.125 MG tablet Take 1 tablet (3.125 mg total) by mouth 2 (two) times daily with a meal. 06/28/21  Yes Darrick Meigs, Marge Duncans, MD  dapagliflozin propanediol (FARXIGA) 10 MG TABS tablet Take 1 tablet (10 mg total) by mouth daily. 06/29/21  Yes Oswald Hillock, MD  dorzolamide-timolol (COSOPT) 22.3-6.8 MG/ML ophthalmic solution Place 1 drop into both eyes 2 (two) times daily.   Yes [provider]  ezetimibe (ZETIA)  10 MG tablet TAKE 1 TABLET(10 MG) BY MOUTH DAILY Patient taking differently: Take 10 mg by mouth daily. 04/16/19  Yes Nahser, Wonda Cheng, MD  feeding supplement (ENSURE ENLIVE / ENSURE PLUS) LIQD Take 237 mLs by mouth 2 (two) times daily between meals. 07/12/21  Yes Mercy Riding, MD  furosemide (LASIX) 40 MG tablet Take 1 tablet by mouth on Mondays, Wednesdays, and Fridays only Patient taking differently: Take 40 mg by mouth See admin instructions. Take  1 tablet by mouth on Mondays, Wednesdays, and Fridays only 08/25/21  Yes Nahser, Wonda Cheng, MD  latanoprost (XALATAN) 0.005 % ophthalmic solution Place 1 drop into both eyes at bedtime.  05/27/13  Yes [provider]  magic mouthwash (nystatin, lidocaine, diphenhydrAMINE) suspension Take 5 mLs by mouth 3 (three) times daily as needed for mouth pain. 09/01/21  Yes Freddi Starr, MD  nitroGLYCERIN (NITROSTAT) 0.4 MG SL tablet Place 1 tablet (0.4 mg total) under the tongue every 5 (five) minutes as needed for chest pain. 02/15/21 02/15/22 Yes Sande Rives E, PA-C  predniSONE (DELTASONE) 10 MG tablet Take 10 mg by mouth See admin instructions. Take 20 mg tablet daily (started on 7/15-7/29), Take 15 mg by mouth daily (7/30-8/13), Take 10 mg daily(8/14-8/28), take 5 mg daily(8/29-9/5)   Yes [provider]  SODIUM FLUORIDE 5000 PPM 1.1 % PSTE Apply 1 application. topically in the morning and at bedtime. 05/17/21  Yes [provider]  spironolactone (ALDACTONE) 25 MG tablet Take 0.5 tablets (12.5 mg total) by mouth daily. 06/29/21  Yes Oswald Hillock, MD  triamcinolone cream (KENALOG) 0.1 % Apply 1 application topically 2 (two) times daily as needed (eczema).   Yes [provider]  sulfamethoxazole-trimethoprim (BACTRIM DS) 800-160 MG tablet Take 1 tablet by mouth 3 (three) times a week. Patient not taking: Reported on 09/28/2021 07/28/21   Freddi Starr, MD    Current Facility-Administered Medications  Medication Dose Route Frequency Provider Last Rate Last Admin   0.9 %  sodium chloride infusion  10 mL/hr Intravenous Once Faylene Million, MD       0.9 %  sodium chloride infusion  250 mL Intravenous PRN Wynetta Fines T, MD       acetaminophen (TYLENOL) tablet 1,000 mg  1,000 mg Oral Q6H PRN Lequita Halt, MD       aspirin EC tablet 81 mg  81 mg Oral Daily Wynetta Fines T, MD       brimonidine (ALPHAGAN) 0.2 % ophthalmic solution 1 drop  1 drop Left Eye BID Wynetta Fines T,  MD   1 drop at 09/29/21 1056   carvedilol (COREG) tablet 3.125 mg  3.125 mg Oral BID WC Mansy, Jan A, MD   3.125 mg at 09/28/21 2301   cyanocobalamin (VITAMIN B12) tablet 1,000 mcg  1,000 mcg Oral Daily Vann, Jessica U, DO       dapagliflozin propanediol (FARXIGA) tablet 10 mg  10 mg Oral Daily Wynetta Fines T, MD       dorzolamide-timolol (COSOPT) 22.3-6.8 MG/ML ophthalmic solution 1 drop  1 drop Both Eyes BID Wynetta Fines T, MD   1 drop at 09/29/21 1043   ezetimibe (ZETIA) tablet 10 mg  10 mg Oral Daily Wynetta Fines T, MD       feeding supplement (ENSURE ENLIVE / ENSURE PLUS) liquid 237 mL  237 mL Oral BID BM Wynetta Fines T, MD       hydrALAZINE (APRESOLINE) tablet 25 mg  25 mg Oral  Q6H PRN Wynetta Fines T, MD       iron sucrose (VENOFER) 500 mg in sodium chloride 0.9 % 250 mL IVPB  500 mg Intravenous Once Wynetta Fines T, MD 69 mL/hr at 09/29/21 1140 500 mg at 09/29/21 1140   latanoprost (XALATAN) 0.005 % ophthalmic solution 1 drop  1 drop Both Eyes QHS Wynetta Fines T, MD   1 drop at 09/28/21 2334   magic mouthwash  5 mL Oral TID PRN Lequita Halt, MD       ondansetron [facility]) injection 4 mg  4 mg Intravenous Q6H PRN Lequita Halt, MD       pantoprozole (PROTONIX) 80 mg /NS 100 mL infusion  8 mg/hr Intravenous Continuous Drenda Freeze, MD 10 mL/hr at 09/29/21 1152 8 mg/hr at 09/29/21 1152   predniSONE (DELTASONE) tablet 15 mg  15 mg Oral Q breakfast Wynetta Fines T, MD       sodium chloride flush (NS) 0.9 % injection 3 mL  3 mL Intravenous Q12H Wynetta Fines T, MD   3 mL at 09/29/21 1058   sodium chloride flush (NS) 0.9 % injection 3 mL  3 mL Intravenous PRN Wynetta Fines T, MD       triamcinolone cream (KENALOG) 0.1 % cream 1 Application  1 Application Topical BID PRN Lequita Halt, MD        Allergies as of 09/28/2021   (No Known Allergies)    Family History  Problem Relation Age of Onset   Heart attack Father    Stroke Father     Social History   Socioeconomic History   Marital status:  Married    Spouse name: Not on file   Number of children: Not on file   Years of education: Not on file   Highest education level: Not on file  Occupational History   Not on file  Tobacco Use   Smoking status: Never   Smokeless tobacco: Never  Vaping Use   Vaping Use: Never used  Substance and Sexual Activity   Alcohol use: No   Drug use: No   Sexual activity: Not on file  Other Topics Concern   Not on file  Social History Narrative   Not on file   Social Determinants of Health   Financial Resource Strain: Not on file  Food Insecurity: Not on file  Transportation Needs: Not on file  Physical Activity: Not on file  Stress: Not on file  Social Connections: Not on file  Intimate Partner Violence: Not on file    Review of Systems: As per HPI, all others negative  Physical Exam: Vital signs in last 24 hours: Temp:  [97.8 F (36.6 C)-98.6 F (37 C)] 98.2 F (36.8 C) (08/11 0824) Pulse Rate:  [79-136] 87 (08/11 0824) Resp:  [16-40] 17 (08/11 0824) BP: (85-115)/(54-82) 101/66 (08/11 0824) SpO2:  [73 %-100 %] 100 % (08/11 0824) Weight:  [63.2 kg] 63.2 kg (08/11 0500) Last BM Date : 09/27/21 General:   Alert,  pale, Well-developed, well-nourished, pleasant and cooperative in NAD Head:  Normocephalic and atraumatic. Eyes:  Sclera clear, no icterus.   Conjunctiva pale Ears:  Normal auditory acuity. Nose:  No deformity, discharge,  or lesions. Mouth:  No deformity or lesions.  Oropharynx pink & moist. Neck:  Supple; no masses or thyromegaly. Heart:  Regular rate and rhythm; no murmurs, clicks, rubs,  or gallops. Abdomen:  Soft, nontender and nondistended. No masses, hepatosplenomegaly or hernias noted. Normal bowel sounds, without  guarding, and without rebound.     Msk:  Symmetrical without gross deformities. Normal posture. Pulses:  Normal pulses noted. Extremities:  Without clubbing or edema. Neurologic:  Alert and  oriented x4;  grossly normal neurologically. Skin:   Pale, Intact without significant lesions or rashes. Psych:  Alert and cooperative. Normal mood and affect.   Lab Results: Recent Labs    09/28/21 1420 09/29/21 0123  WBC 12.0* 8.0  HGB 6.5* 7.7*  HCT 23.2* 26.1*  PLT 468* 357   BMET Recent Labs    09/28/21 1420  NA 131*  K 4.8  CL 93*  CO2 30  GLUCOSE 105*  BUN 15  CREATININE 0.60*  CALCIUM 8.6*   LFT Recent Labs    09/28/21 1420  PROT 6.2*  ALBUMIN 3.1*  AST 17  ALT 21  ALKPHOS 55  BILITOT 0.5   PT/INR No results for input(s): "LABPROT", "INR" in the last 72 hours.  Studies/Results: VAS [place] LOWER EXTREMITY VENOUS (DVT)  Result Date: 09/29/2021  Lower Venous DVT Study Patient Name:  ERNIE KASLER  Date of Exam:   09/29/2021 Medical Rec #: [id number]            Accession #:    [id number] Date of Birth: 05-Jun-1950            Patient Gender: M Patient Age:   68 years Exam Location[facility]  [facility] Procedure:      VAS [place] LOWER EXTREMITY VENOUS (DVT) Referring Phys: Wynetta Fines --------------------------------------------------------------------------------  Indications: Hx of DVT, and Swelling.  Comparison Study: 07/05/21, positive for DVT on the right in the popliteal, PTV,                   pero and gastroc veins. Resolved on this exam. Performing Technologist: Bobetta Lime BS, RVT  Examination Guidelines: A complete evaluation includes B-mode imaging, spectral Doppler, color Doppler, and power Doppler as needed of all accessible portions of each vessel. Bilateral testing is considered an integral part of a complete examination. Limited examinations for reoccurring indications may be performed as noted. The reflux portion of the exam is performed with the patient in reverse Trendelenburg.  +---------+---------------+---------+-----------+----------+--------------+ RIGHT    CompressibilityPhasicitySpontaneityPropertiesThrombus Aging +---------+---------------+---------+-----------+----------+--------------+ CFV       Full           Yes      Yes                                 +---------+---------------+---------+-----------+----------+--------------+ SFJ      Full                                                        +---------+---------------+---------+-----------+----------+--------------+ FV Prox  Full                                                        +---------+---------------+---------+-----------+----------+--------------+ FV Mid   Full                                                        +---------+---------------+---------+-----------+----------+--------------+  FV DistalFull                                                        +---------+---------------+---------+-----------+----------+--------------+ PFV      Full                                                        +---------+---------------+---------+-----------+----------+--------------+ POP      Full           Yes      Yes                                 +---------+---------------+---------+-----------+----------+--------------+ PTV      Full                    Yes                                 +---------+---------------+---------+-----------+----------+--------------+ PERO     Full                    Yes                                 +---------+---------------+---------+-----------+----------+--------------+   +---------+---------------+---------+-----------+----------+--------------+ LEFT     CompressibilityPhasicitySpontaneityPropertiesThrombus Aging +---------+---------------+---------+-----------+----------+--------------+ CFV      Full           Yes      Yes                                 +---------+---------------+---------+-----------+----------+--------------+ SFJ      Full                                                        +---------+---------------+---------+-----------+----------+--------------+ FV Prox  Full                                                         +---------+---------------+---------+-----------+----------+--------------+ FV Mid   Full                                                        +---------+---------------+---------+-----------+----------+--------------+ FV DistalFull                                                        +---------+---------------+---------+-----------+----------+--------------+  PFV      Full                                                        +---------+---------------+---------+-----------+----------+--------------+ POP      Full           Yes      Yes                                 +---------+---------------+---------+-----------+----------+--------------+ PTV      Full                                                        +---------+---------------+---------+-----------+----------+--------------+ PERO     Full                    Yes                                 +---------+---------------+---------+-----------+----------+--------------+    Summary: BILATERAL: - No evidence of deep vein thrombosis seen in the lower extremities, bilaterally. -No evidence of popliteal cyst, bilaterally. RIGHT: - Findings suggest resolution of previously noted thrombus.   *See table(s) above for measurements and observations.    Preliminary    DG Chest Portable 1 View  Result Date: 09/28/2021 CLINICAL DATA:  Tachypnea, shortness of breath EXAM: PORTABLE CHEST 1 VIEW COMPARISON:  09/01/2021 FINDINGS: Diffuse airspace disease throughout the left lung compatible with fibrotic changes seen on prior CT and plain films, unchanged. No confluent opacity on the right. Prior median sternotomy and CABG. Heart is normal size. Aortic atherosclerosis. No acute bony abnormality. IMPRESSION: Stable chronic fibrotic changes most pronounced in the left lung. No acute cardiopulmonary disease. Electronically Signed   By: Rolm Baptise M.D.   On: 09/28/2021 17:29    Impression:    Weakness. Symptomatic anemia.  Worsening. Hemoccult-positive stools. DVT May 2023, on apixaban, last dose yesterday. Chronic anticoagulation. Bronchiolitis obliterans organizing pneumonia (BOOP).  Plan:   Cardiology and Pulmonary clearance for endoscopy/colonoscopy tomorrow. Tentative plans for endoscopy/colonoscopy tomorrow, pending cardiology/pulmonary clearance. Risks (bleeding, infection, bowel perforation that could require surgery, sedation-related changes in cardiopulmonary systems), benefits (identification and possible treatment of source of symptoms, exclusion of certain causes of symptoms), and alternatives (watchful waiting, radiographic imaging studies, empiric medical treatment) of upper endoscopy (EGD) were explained to patient/family in detail and patient wishes to proceed.  Risks (bleeding, infection, bowel perforation that could require surgery, sedation-related changes in cardiopulmonary systems), benefits (identification and possible treatment of source of symptoms, exclusion of certain causes of symptoms), and alternatives (watchful waiting, radiographic imaging studies, empiric medical treatment) of colonoscopy were explained to patient/family in detail and patient wishes to proceed.    LOS: 1 day   Carlson Belland M  09/29/2021, 2:00 PM  Cell [phone] If no answer or after 5 PM call [phone]

## 2021-09-29 NOTE — Progress Notes (Signed)
Heart Failure Navigator Progress Note  Assessed for Heart & Vascular TOC clinic readiness.  Patient does not meet criteria due to anemia diagnosis., Not acute CHF.     Roberto Ponce, BSN, Clinical cytogeneticist Only

## 2021-09-29 NOTE — Progress Notes (Addendum)
PROGRESS NOTE    Roberto Ponce  IWL:[id number] DOB: November 07, 1950 DOA: 09/28/2021 PCP: Kristen Loader, FNP    Brief Narrative:  Roberto Ponce is a 71 y.o. 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   Patient was recently started on Eliquis for RLE DVT found on last admission in May 2023.  Patient has history of diverticulosis diagnosed with routine screening colonoscopy back in 2017 but never had diverticulosis bleeding or diverticulitis before.  Patient was doing okay with the Eliquis denies any abdominal pain no blood in the stool no dark-colored stool.  However he does feel increasing lethargy and went to see PCP today who did a blood work showed hemoglobin= 6.1 and sent to ED.  No chest pain no cough.   Patient has BOOP of the left lung and recently completed Bactrim treatment for 3 weeks and still on prednisone tapering and now is on 15 mg daily.   Assessment and Plan:  Acute on chronic iron deficiency anemia, symptomatic -Likely has a slow undetected GI bleed. -PPI BID -PRBC x1 - GI consulted -Venofer x 1    Recent DVT - last admission discharge summary considered his DVT is provoked by testicle or cancer however patient underwent orchiectomy in March and was cleared by oncology afterwards that " everything was removed". -Acutely, Eliquis contraindicated given acute decompensated anemia, however whether patient still in hypercoagulable state is unknown at this point.  Recommend outpatient follow-up with his oncology to discuss indication for long-term anticoagulation. -For  now, will check DVT study  - waiting for GI workup with EGD/colonoscopy   Chronic HFrEF -Stable, clinically appears to be euvolemic -Continue 3 times a week Lasix.   HTN -Blood pressure borderline low, hold off home BP meds   Left lung BOOP -Off antibiotics, continue steroid tapering- down to '15mg'$  -paged PCCM to discuss risk with egd/colonoscopy - briefly discussed with on-call-- may need stress dose steroids if develops hypotension.   -on 2L [place]   Chronic hyponatremia -Stable -labs in AM   Severe to moderate MR -Cardiology consult  Low normal B12 -replace PO  DVT prophylaxis: SCDs Start: 09/28/21 1921    Code Status: Full Code Family Communication: wife at bedside  Disposition Plan:  Level of care: Telemetry Medical Status is: Inpatient Remains inpatient appropriate because: needs EGD/colonoscopy    Consultants:  GI  Cards PCCM (phone)   Subjective: hungry  Objective: Vitals:   09/29/21 0206 09/29/21 0500 09/29/21 0555 09/29/21 0824  BP: 101/67  105/61 101/66  Pulse: 82  92 87  Resp: '16  16 17  '$ Temp: 98.6 F (37 C)  98.3 F (36.8 C) 98.2 F (36.8 C)  TempSrc:    Oral  SpO2: 100%  94% 100%  Weight:  63.2 kg    Height:        Intake/Output Summary (Last 24 hours) at 09/29/2021 1340 Last data filed at 09/29/2021 1228 Gross per 24 hour  Intake 454.69 ml  Output 1500 ml  Net -1045.31 ml   Filed Weights   09/28/21 2300 09/29/21 0500  Weight: 63.2 kg 63.2 kg    Examination:   General: Appearance:    Chronically ill appearing male in  no acute distress     Lungs:     Diminished, on 2L [place], respirations unlabored  Heart:    Normal heart rate.    MS:   All extremities are intact.    Neurologic:   Awake, alert       Data Reviewed: I have personally reviewed following labs and imaging studies  CBC: Recent Labs  Lab 09/28/21 1420 09/29/21 0123  WBC 12.0* 8.0  HGB 6.5* 7.7*  HCT 23.2* 26.1*  MCV 73.7* 75.9*  PLT 468* 109   Basic  Metabolic Panel: Recent Labs  Lab 09/28/21 1420  NA 131*  K 4.8  CL 93*  CO2 30  GLUCOSE 105*  BUN 15  CREATININE 0.60*  CALCIUM 8.6*   GFR: Estimated Creatinine Clearance: 76.8 mL/min (A) (by C-G formula based on SCr of 0.6 mg/dL (L)). Liver Function Tests: Recent Labs  Lab 09/28/21 1420  AST 17  ALT 21  ALKPHOS 55  BILITOT 0.5  PROT 6.2*  ALBUMIN 3.1*   No results for input(s): "LIPASE", "AMYLASE" in the last 168 hours. No results for input(s): "AMMONIA" in the last 168 hours. Coagulation Profile: No results for input(s): "INR", "PROTIME" in the last 168 hours. Cardiac Enzymes: No results for input(s): "CKTOTAL", "CKMB", "CKMBINDEX", "TROPONINI" in the last 168 hours. BNP (last 3 results) No results for input(s): "PROBNP" in the last 8760 hours. HbA1C: No results for input(s): "HGBA1C" in the last 72 hours. CBG: No results for input(s): "GLUCAP" in the last 168 hours. Lipid Profile: No results for input(s): "CHOL", "HDL", "LDLCALC", "TRIG", "CHOLHDL", "LDLDIRECT" in the last 72 hours. Thyroid Function Tests: No results for input(s): "TSH", "T4TOTAL", "FREET4", "T3FREE", "THYROIDAB" in the last 72 hours. Anemia Panel: Recent Labs    09/28/21 1420  VITAMINB12 180  FOLATE 15.1  FERRITIN 6*  TIBC 295  IRON 13*  RETICCTPCT 3.5*   Sepsis Labs: No results for input(s): "PROCALCITON", "LATICACIDVEN" in the last 168 hours.  No results found for this or any previous visit (from the past 240 hour(s)).       Radiology Studies: VAS [place] LOWER EXTREMITY VENOUS (DVT)  Result Date: 09/29/2021  Lower Venous DVT Study Patient Name:  Roberto Ponce  Date of Exam:   09/29/2021 Medical Rec #: [id number]            Accession #:    [id number] Date of Birth: October 28, 1950            Patient Gender: M Patient Age:   53 years Exam Location:  [facility] Procedure:      VAS [place] LOWER EXTREMITY VENOUS (DVT) Referring Phys: Wynetta Fines  --------------------------------------------------------------------------------  Indications: Hx of DVT, and Swelling.  Comparison Study: 07/05/21, positive for DVT on the right in the popliteal, PTV,                   pero and gastroc veins. Resolved on this exam. Performing Technologist: Bobetta Lime BS, RVT  Examination Guidelines: A complete evaluation includes B-mode imaging, spectral Doppler, color Doppler, and power Doppler as needed of all accessible portions of each vessel. Bilateral testing is considered an integral part of a complete examination. Limited examinations for reoccurring indications may be performed as noted. The reflux portion of the exam is performed with the patient in reverse Trendelenburg.  +---------+---------------+---------+-----------+----------+--------------+ RIGHT    CompressibilityPhasicitySpontaneityPropertiesThrombus Aging +---------+---------------+---------+-----------+----------+--------------+ CFV      Full           Yes  Yes                                 +---------+---------------+---------+-----------+----------+--------------+ SFJ      Full                                                        +---------+---------------+---------+-----------+----------+--------------+ FV Prox  Full                                                        +---------+---------------+---------+-----------+----------+--------------+ FV Mid   Full                                                        +---------+---------------+---------+-----------+----------+--------------+ FV DistalFull                                                        +---------+---------------+---------+-----------+----------+--------------+ PFV      Full                                                        +---------+---------------+---------+-----------+----------+--------------+ POP      Full           Yes      Yes                                  +---------+---------------+---------+-----------+----------+--------------+ PTV      Full                    Yes                                 +---------+---------------+---------+-----------+----------+--------------+ PERO     Full                    Yes                                 +---------+---------------+---------+-----------+----------+--------------+   +---------+---------------+---------+-----------+----------+--------------+ LEFT     CompressibilityPhasicitySpontaneityPropertiesThrombus Aging +---------+---------------+---------+-----------+----------+--------------+ CFV      Full           Yes      Yes                                 +---------+---------------+---------+-----------+----------+--------------+ SFJ      Full                                                        +---------+---------------+---------+-----------+----------+--------------+  FV Prox  Full                                                        +---------+---------------+---------+-----------+----------+--------------+ FV Mid   Full                                                        +---------+---------------+---------+-----------+----------+--------------+ FV DistalFull                                                        +---------+---------------+---------+-----------+----------+--------------+ PFV      Full                                                        +---------+---------------+---------+-----------+----------+--------------+ POP      Full           Yes      Yes                                 +---------+---------------+---------+-----------+----------+--------------+ PTV      Full                                                        +---------+---------------+---------+-----------+----------+--------------+ PERO     Full                    Yes                                  +---------+---------------+---------+-----------+----------+--------------+    Summary: BILATERAL: - No evidence of deep vein thrombosis seen in the lower extremities, bilaterally. -No evidence of popliteal cyst, bilaterally. RIGHT: - Findings suggest resolution of previously noted thrombus.   *See table(s) above for measurements and observations.    Preliminary    DG Chest Portable 1 View  Result Date: 09/28/2021 CLINICAL DATA:  Tachypnea, shortness of breath EXAM: PORTABLE CHEST 1 VIEW COMPARISON:  09/01/2021 FINDINGS: Diffuse airspace disease throughout the left lung compatible with fibrotic changes seen on prior CT and plain films, unchanged. No confluent opacity on the right. Prior median sternotomy and CABG. Heart is normal size. Aortic atherosclerosis. No acute bony abnormality. IMPRESSION: Stable chronic fibrotic changes most pronounced in the left lung. No acute cardiopulmonary disease. Electronically Signed   By: Rolm Baptise M.D.   On: 09/28/2021 17:29        Scheduled Meds:  aspirin EC  81 mg Oral Daily   brimonidine  1 drop Left Eye BID   carvedilol  3.125 mg Oral BID WC  dapagliflozin propanediol  10 mg Oral Daily   dorzolamide-timolol  1 drop Both Eyes BID   ezetimibe  10 mg Oral Daily   feeding supplement  237 mL Oral BID BM   ferrous sulfate  300 mg Oral Q breakfast   latanoprost  1 drop Both Eyes QHS   predniSONE  15 mg Oral Q breakfast   sodium chloride flush  3 mL Intravenous Q12H   Continuous Infusions:  sodium chloride     sodium chloride     iron sucrose 500 mg (09/29/21 1140)   pantoprazole 8 mg/hr (09/29/21 1152)     LOS: 1 day    Time spent: 45 minutes spent on chart review, discussion with nursing staff, consultants, updating family and interview/physical exam; more than 50% of that time was spent in counseling and/or coordination of care.    Geradine Girt, DO [facility] Available via Epic secure chat 7am-7pm After these hours, please refer  to coverage provider listed on [email or web address] 09/29/2021, 1:40 PM

## 2021-09-29 NOTE — Consult Note (Addendum)
Cardiology Consultation:   Patient ID: Roberto Ponce MRN: [id number]; DOB: October 31, 1950  Admit date: 09/28/2021 Date of Consult: 09/29/2021  PCP:  Roberto Ponce, [place] Providers Cardiologist:  Roberto Lean, MD        Patient Profile:   Roberto Ponce is a 71 y.o. male with a hx of CAD with old inferior MI s/p CABG in 17 (age 44) and DES to Cx 01/2021, mitral regurgitation (not felt to be a MitraClip candidate), chronic HFmrEF, BOOP with pneumomediastinum 06/2021 with subsequent chronic respiratory failure on home O2, asthma, dyslipidemia, venous stasis, diverticulosis, glaucoma, protein-calorie malnutrition, testicular CA s/p R orchiectomy 04/2021, RLE DVT, chronic appearing hyponatremia (reported presumed SIADH) who is being seen 09/29/2021 for pre-procedural evaluation at the request of Dr. Eliseo Ponce.  History of Present Illness:   Roberto Ponce was formerly followed by Dr. Doreatha Ponce then Dr. Nahser/Dr. Gasper Ponce as well as Dr. Burt Ponce. He had remote CABG in 1988 and prior known mitral regurgitation. In 2022 he developed worsening MR (moderate by echo, severe by TEE) and was referred to structural team. Decision was made to pursue cath for worsening dyspnea which was performed 01/2021 which showed 3 vessel CAD with CTO of LAD, CTO of RCA, and severe stenosis of the native LCX. LIMA to LAD and RIMA to RCA were patent -> received DES to LCx. Plan was to follow response to PCI and MR with serial echoes. He was admitted 06/2021 with acute on chronic hypoxic respiratory failure felt possibly due to BOOP as well as pneumomediastinum, RLE DVT, AKI, elevated LFTs, hyponatremia, and anemia of chronic disease. Echo during that admission showed EF 40-45% (decreased from 60-65% in 2022), +WMA, mildly elevated PASP, mild-moderate MR, mild-moderate TR. In follow-up 08/2021 with Dr. Burt Ponce he had had significant weight loss and frailty and was felt to be a poor candidate for  MitraClip given overall general decline - he felt MR was more in the moderate range. Dr. Burt Ponce stopped Plavix as he was >6 months out from PCI.  This admission, he presented to the hospital with acute on chronic anemia discovered on PCP labs. He had a physical next week and usually goes for labs 1 week prior. Hgb was 6.1 prompting call to go to the ER. He had noticed worsening SOB superimposed on baseline dyspnea over the last week and a half as well as some mild edema and 1lb weight gain. He had not had any CP or orthopnea. Here in the hospital, FOBT was positive. He had not observed any s/sx of GI/GU bleeding. He had increasing O2 requirement from baseline as well. GI bleed suspected. Has been treated with blood transfusion, PPI and iron. The internal medicine team discussed potential IVC filter with the patient and wife who requested cardiology consultation. The GI team has also requested cardiology/pulmonary clearance for EGD/colonoscopy scheduled for tomorrow. He appears to have increased RR at baseline. Wife observed that breathing actually seems to be getting a little better since the current blood transfusion started.  Prelim LE venous duplex today showed resolution of prior DVT.   Past Medical History:  Diagnosis Date   Asthma    BOOP (bronchiolitis obliterans with organizing pneumonia) ([place])    Chronic respiratory failure ([place])    Coronary artery disease    DVT (deep venous thrombosis) ([facility])    Glaucoma    Heart failure with mid-range ejection fraction ([place])    History of gallstones    Hyperlipidemia    Hyponatremia  Hypotension due to medication 06/22/2021   LVH (left ventricular hypertrophy)    MI (myocardial infarction) ([place])    Mitral regurgitation    Mixed dyslipidemia    Pneumomediastinum ([facility])    Protein calorie malnutrition ([place])    Shock ([place]) 06/22/2021   Venous stasis     Past Surgical History:  Procedure Laterality Date   CARDIAC CATHETERIZATION  09/09/1986    EF 56%   CARDIOVASCULAR STRESS TEST  12/16/2007   EF 54% NO ISCHEMIA   CHOLECYSTECTOMY     CORONARY ARTERY BYPASS GRAFT     LIMA TO THE LAD AND RIMA TO RIGHT CORONARY ARTERY   CORONARY STENT INTERVENTION N/A 02/14/2021   Procedure: CORONARY STENT INTERVENTION;  Surgeon: Roberto Mocha, MD;  Location: [facility];  Service: Cardiovascular;  Laterality: N/A;   ORCHIECTOMY Right 04/19/2021   Procedure: RIGHT RADICAL INGUINAL ORCHIECTOMY;  Surgeon: Roberto Mallow, MD;  Location: [facility];  Service: Urology;  Laterality: Right;  1 HR FOR CASE   RIGHT/LEFT HEART CATH AND CORONARY/GRAFT ANGIOGRAPHY N/A 02/14/2021   Procedure: RIGHT/LEFT HEART CATH AND CORONARY/GRAFT ANGIOGRAPHY;  Surgeon: Roberto Mocha, MD;  Location: [facility];  Service: Cardiovascular;  Laterality: N/A;   TEE WITHOUT CARDIOVERSION N/A 09/02/2020   Procedure: TRANSESOPHAGEAL ECHOCARDIOGRAM (TEE);  Surgeon: Roberto Lean, MD;  Location: [facility];  Service: Cardiovascular;  Laterality: N/A;   US ECHOCARDIOGRAPHY  12/12/2007   EF 55-60%   VASECTOMY  1988     Home Medications:  Prior to Admission medications   Medication Sig Start Date End Date Taking? Authorizing Provider  acetaminophen (TYLENOL) 500 MG tablet Take 1,000 mg by mouth every 6 (six) hours as needed for moderate pain.   Yes [provider]  apixaban (ELIQUIS) 5 MG TABS tablet Take 1 tablet (5 mg total) by mouth 2 (two) times daily. 07/12/21  Yes Roberto Riding, MD  aspirin EC 81 MG tablet Take 1 tablet (81 mg total) by mouth daily. Swallow whole. 08/23/21  Yes Roberto Mocha, MD  brimonidine [facility]) 0.2 % ophthalmic solution Place 1 drop into the left eye 2 (two) times daily. 12/19/20  Yes [provider]  carvedilol (COREG) 3.125 MG tablet Take 1 tablet (3.125 mg total) by mouth 2 (two) times daily with a meal. 06/28/21  Yes Roberto Ponce, Roberto Duncans, MD  dapagliflozin propanediol (FARXIGA) 10 MG TABS tablet Take 1 tablet (10 mg  total) by mouth daily. 06/29/21  Yes Roberto Hillock, MD  dorzolamide-timolol (COSOPT) 22.3-6.8 MG/ML ophthalmic solution Place 1 drop into both eyes 2 (two) times daily.   Yes [provider]  ezetimibe (ZETIA) 10 MG tablet TAKE 1 TABLET(10 MG) BY MOUTH DAILY Patient taking differently: Take 10 mg by mouth daily. 04/16/19  Yes Roberto Ponce, Wonda Cheng, MD  feeding supplement (ENSURE ENLIVE / ENSURE PLUS) LIQD Take 237 mLs by mouth 2 (two) times daily between meals. 07/12/21  Yes Roberto Riding, MD  furosemide (LASIX) 40 MG tablet Take 1 tablet by mouth on Mondays, Wednesdays, and Fridays only Patient taking differently: Take 40 mg by mouth See admin instructions. Take 1 tablet by mouth on Mondays, Wednesdays, and Fridays only 08/25/21  Yes Roberto Ponce, Wonda Cheng, MD  latanoprost (XALATAN) 0.005 % ophthalmic solution Place 1 drop into both eyes at bedtime.  05/27/13  Yes [provider]  magic mouthwash (nystatin, lidocaine, diphenhydrAMINE) suspension Take 5 mLs by mouth 3 (three) times daily as needed for mouth pain. 09/01/21  Yes [name],  Cheryle Horsfall, MD  nitroGLYCERIN (NITROSTAT) 0.4 MG SL tablet Place 1 tablet (0.4 mg total) under the tongue every 5 (five) minutes as needed for chest pain. 02/15/21 02/15/22 Yes Sande Rives E, PA-C  predniSONE (DELTASONE) 10 MG tablet Take 10 mg by mouth See admin instructions. Take 20 mg tablet daily (started on 7/15-7/29), Take 15 mg by mouth daily (7/30-8/13), Take 10 mg daily(8/14-8/28), take 5 mg daily(8/29-9/5)   Yes [provider]  SODIUM FLUORIDE 5000 PPM 1.1 % PSTE Apply 1 application. topically in the morning and at bedtime. 05/17/21  Yes [provider]  spironolactone (ALDACTONE) 25 MG tablet Take 0.5 tablets (12.5 mg total) by mouth daily. 06/29/21  Yes Roberto Hillock, MD  triamcinolone cream (KENALOG) 0.1 % Apply 1 application topically 2 (two) times daily as needed (eczema).   Yes [provider]  sulfamethoxazole-trimethoprim  (BACTRIM DS) 800-160 MG tablet Take 1 tablet by mouth 3 (three) times a week. Patient not taking: Reported on 09/28/2021 07/28/21   Freddi Starr, MD    Inpatient Medications: Scheduled Meds:  aspirin EC  81 mg Oral Daily   brimonidine  1 drop Left Eye BID   carvedilol  3.125 mg Oral BID WC   dapagliflozin propanediol  10 mg Oral Daily   dorzolamide-timolol  1 drop Both Eyes BID   ezetimibe  10 mg Oral Daily   feeding supplement  237 mL Oral BID BM   latanoprost  1 drop Both Eyes QHS   predniSONE  15 mg Oral Q breakfast   sodium chloride flush  3 mL Intravenous Q12H   Continuous Infusions:  sodium chloride     sodium chloride     iron sucrose 500 mg (09/29/21 1140)   pantoprazole 8 mg/hr (09/29/21 1152)   PRN Meds: sodium chloride, acetaminophen, hydrALAZINE, magic mouthwash, ondansetron (ZOFRAN) IV, sodium chloride flush, triamcinolone cream  Allergies:   No Known Allergies  Social History:   Social History   Socioeconomic History   Marital status: Married    Spouse name: Not on file   Number of children: Not on file   Years of education: Not on file   Highest education level: Not on file  Occupational History   Not on file  Tobacco Use   Smoking status: Never   Smokeless tobacco: Never  Vaping Use   Vaping Use: Never used  Substance and Sexual Activity   Alcohol use: No   Drug use: No   Sexual activity: Not on file  Other Topics Concern   Not on file  Social History Narrative   Not on file   Social Determinants of Health   Financial Resource Strain: Not on file  Food Insecurity: Not on file  Transportation Needs: Not on file  Physical Activity: Not on file  Stress: Not on file  Social Connections: Not on file  Intimate Partner Violence: Not on file    Family History:   Family History  Problem Relation Age of Onset   Heart attack Father    Stroke Father      ROS:  Please see the history of present illness.  All other ROS reviewed and  negative.     Physical Exam/Data:   Vitals:   09/29/21 0206 09/29/21 0500 09/29/21 0555 09/29/21 0824  BP: 101/67  105/61 101/66  Pulse: 82  92 87  Resp: '16  16 17  '$ Temp: 98.6 F (37 C)  98.3 F (36.8 C) 98.2 F (36.8 C)  TempSrc:  Oral  SpO2: 100%  94% 100%  Weight:  63.2 kg    Height:        Intake/Output Summary (Last 24 hours) at 09/29/2021 1345 Last data filed at 09/29/2021 1228 Gross per 24 hour  Intake 454.69 ml  Output 1500 ml  Net -1045.31 ml      09/29/2021    5:00 AM 09/28/2021   11:00 PM 08/23/2021   10:24 AM  Last 3 Weights  Weight (lbs) 139 lb 5.3 oz 139 lb 5.3 oz 134 lb  Weight (kg) 63.2 kg 63.2 kg 60.782 kg     Body mass index is 20.57 kg/m.  General: Frail cachectic appearing WM in no acute distress. Head: Normocephalic, atraumatic, sclera non-icteric, no xanthomas, nares are without discharge. Neck: Negative for carotid bruits. JVP not elevated. Lungs: Diffuse fibrotic crackles without wheezing or rhonchi. Breathing is unlabored. Heart: RRR S1 S2, SEM at apex, no rubs or gallops.  Abdomen: Soft, non-tender, non-distended with normoactive bowel sounds. No rebound/guarding. Extremities: No clubbing or cyanosis. No edema. Distal pedal pulses are 2+ and equal bilaterally. Neuro: Alert and oriented X 3. Moves all extremities spontaneously. Psych:  Responds to questions appropriately with a normal affect.   EKG:  The EKG was personally reviewed and demonstrates:  NSR 84bpm, baseline wander, nonspecific STTW changes  Telemetry:  Telemetry was personally reviewed and demonstrates:  NSR  Relevant CV Studies:  2D echo 06/2021  1. Left ventricular ejection fraction, by estimation, is 40 to 45%. The  left ventricle has mildly decreased function. The left ventricle  demonstrates regional wall motion abnormalities (see scoring  diagram/findings for description). There is mild  concentric left ventricular hypertrophy. Left ventricular diastolic  parameters  are indeterminate. There is akinesis of the left ventricular,  basal-mid inferior wall. There is hypokinesis of the left ventricular,  mid-apical inferolateral wall and inferior   wall.   2. Right ventricular systolic function is normal. The right ventricular  size is normal. There is mildly elevated pulmonary artery systolic  pressure.   3. The mitral valve is abnormal. Mild to moderate mitral valve  regurgitation.   4. Tricuspid valve regurgitation is mild to moderate.   5. The aortic valve is normal in structure. Aortic valve regurgitation is  not visualized. No aortic stenosis is present.   Comparison(s): No significant change from prior study. Prior images  reviewed side by side.   Cath 01/2021 1.  Severe 3-vessel coronary artery disease with total occlusion of the LAD and total occlusion of the RCA, and severe stenosis of the native left circumflex 2.  Status post CABG with continued patency of the LIMA to LAD and RIMA to RCA 3.  Successful PCI of severe 95% stenosis of the mid circumflex, treated with a 3.0 x 15 mm Onyx frontier DES 4.  Severe mitral regurgitation by echo assessment, hemodynamics demonstrate low wedge pressure, no V wave, and no pulmonary hypertension.  Recommendations: DAPT with aspirin and clopidogrel x6 months, repeat echocardiogram in 6 months, defer transcatheter edge-to-edge mitral valve repair at present to reevaluate after PCI  Laboratory Data:  High Sensitivity Troponin:  No results for input(s): "TROPONINIHS" in the last 720 hours.   Chemistry Recent Labs  Lab 09/28/21 1420  NA 131*  K 4.8  CL 93*  CO2 30  GLUCOSE 105*  BUN 15  CREATININE 0.60*  CALCIUM 8.6*  GFRNONAA >60  ANIONGAP 8    Recent Labs  Lab 09/28/21 1420  PROT 6.2*  ALBUMIN 3.1*  AST 17  ALT 21  ALKPHOS 55  BILITOT 0.5   Lipids No results for input(s): "CHOL", "TRIG", "HDL", "LABVLDL", "LDLCALC", "CHOLHDL" in the last 168 hours.  Hematology Recent Labs  Lab  09/28/21 1420 09/29/21 0123  WBC 12.0* 8.0  RBC 3.15*  3.18* 3.44*  HGB 6.5* 7.7*  HCT 23.2* 26.1*  MCV 73.7* 75.9*  MCH 20.6* 22.4*  MCHC 28.0* 29.5*  RDW 17.9* 19.3*  PLT 468* 357    Radiology/Studies:  VAS [place] LOWER EXTREMITY VENOUS (DVT)  Result Date: 09/29/2021  Lower Venous DVT Study Patient Name:  Roberto Ponce  Date of Exam:   09/29/2021 Medical Rec #: [id number]            Accession #:    [id number] Date of Birth: 09/10/1950            Patient Gender: M Patient Age:   40 years Exam Location:  [facility] Procedure:      VAS [place] LOWER EXTREMITY VENOUS (DVT) Referring Phys: Wynetta Fines --------------------------------------------------------------------------------  Indications: Hx of DVT, and Swelling.  Comparison Study: 07/05/21, positive for DVT on the right in the popliteal, PTV,                   pero and gastroc veins. Resolved on this exam. Performing Technologist: Bobetta Lime BS, RVT  Examination Guidelines: A complete evaluation includes B-mode imaging, spectral Doppler, color Doppler, and power Doppler as needed of all accessible portions of each vessel. Bilateral testing is considered an integral part of a complete examination. Limited examinations for reoccurring indications may be performed as noted. The reflux portion of the exam is performed with the patient in reverse Trendelenburg.  +---------+---------------+---------+-----------+----------+--------------+ RIGHT    CompressibilityPhasicitySpontaneityPropertiesThrombus Aging +---------+---------------+---------+-----------+----------+--------------+ CFV      Full           Yes      Yes                                 +---------+---------------+---------+-----------+----------+--------------+ SFJ      Full                                                        +---------+---------------+---------+-----------+----------+--------------+ FV Prox  Full                                                         +---------+---------------+---------+-----------+----------+--------------+ FV Mid   Full                                                        +---------+---------------+---------+-----------+----------+--------------+ FV DistalFull                                                        +---------+---------------+---------+-----------+----------+--------------+  PFV      Full                                                        +---------+---------------+---------+-----------+----------+--------------+ POP      Full           Yes      Yes                                 +---------+---------------+---------+-----------+----------+--------------+ PTV      Full                    Yes                                 +---------+---------------+---------+-----------+----------+--------------+ PERO     Full                    Yes                                 +---------+---------------+---------+-----------+----------+--------------+   +---------+---------------+---------+-----------+----------+--------------+ LEFT     CompressibilityPhasicitySpontaneityPropertiesThrombus Aging +---------+---------------+---------+-----------+----------+--------------+ CFV      Full           Yes      Yes                                 +---------+---------------+---------+-----------+----------+--------------+ SFJ      Full                                                        +---------+---------------+---------+-----------+----------+--------------+ FV Prox  Full                                                        +---------+---------------+---------+-----------+----------+--------------+ FV Mid   Full                                                        +---------+---------------+---------+-----------+----------+--------------+ FV DistalFull                                                         +---------+---------------+---------+-----------+----------+--------------+ PFV      Full                                                        +---------+---------------+---------+-----------+----------+--------------+  POP      Full           Yes      Yes                                 +---------+---------------+---------+-----------+----------+--------------+ PTV      Full                                                        +---------+---------------+---------+-----------+----------+--------------+ PERO     Full                    Yes                                 +---------+---------------+---------+-----------+----------+--------------+    Summary: BILATERAL: - No evidence of deep vein thrombosis seen in the lower extremities, bilaterally. -No evidence of popliteal cyst, bilaterally. RIGHT: - Findings suggest resolution of previously noted thrombus.   *See table(s) above for measurements and observations.    Preliminary    DG Chest Portable 1 View  Result Date: 09/28/2021 CLINICAL DATA:  Tachypnea, shortness of breath EXAM: PORTABLE CHEST 1 VIEW COMPARISON:  09/01/2021 FINDINGS: Diffuse airspace disease throughout the left lung compatible with fibrotic changes seen on prior CT and plain films, unchanged. No confluent opacity on the right. Prior median sternotomy and CABG. Heart is normal size. Aortic atherosclerosis. No acute bony abnormality. IMPRESSION: Stable chronic fibrotic changes most pronounced in the left lung. No acute cardiopulmonary disease. Electronically Signed   By: Rolm Baptise M.D.   On: 09/28/2021 17:29     Assessment and Plan:   1. Acute on chronic anemia with suspected GIB, complicated by recent DVT 06/2021 (not seen on duplex this adm) - patient/family wanted input on IVC filter given cardiac conditions and GI requested cardiac clearance for EGD/colonoscopy - patient's RCRI is 6.6% indicating moderate CV risk but GI procedures seem necessary to  determine source of anemia especially given need for anticoagulation with recent DVT -> will review with MD  2. Chronic HFmrEF/ischemic cardiomyopathy - on carvedilol 3.'125mg'$  BID, Farxiga '10mg'$  daily ordered here but held this AM due to NPO - home spironolactone and Lasix on hold with ABL anemia - follow volume status carefully - will discuss med rx and potential diuretic while receiving blood transfusion with MD  3. CAD s/p remote CABG 1988, PCI to Cx 01/2021 - Plavix stopped 08/2021 as >6 months out from last PCI - ASA continued by primary team, but not given this AM due to NPO status - continue BB, Zetia (statin previously stopped due to elevated LFTs - can revisit resumption as outpatient)  4. Mitral regurgitation - varying severity, moderate-severe in 2022, then mild-moderate by echo 06/2021 - Dr. Burt Ponce felt exam more c/w moderate MR - continue to follow clinically, not felt to be a candidate for MitraClip per Dr. Burt Ponce  5. Essential HTN - BP low/normal which was previously observed as OP - follow in context above  6. BOOP - increased O2 requirement on admission -> pulmonary consultation had been requested per GI notes - if worsening SOB does not improve with measures above, may need to consider CTA to exclude PE though  seems less likely with patient adherence to anticoagulation prior to admission   Risk Assessment/Risk Scores:        New York Heart Association (NYHA) Functional Class NYHA Class III-IV     For questions or updates, please contact [place] Please consult [email or web address] for contact info under    Signed, Charlie Pitter, PA-C  09/29/2021 1:45 PM  Patient seen and examined and agree with Melina Copa, PA-C as detailed above.  In brief, the patient is a  71 y.o. male with a hx of CAD with old inferior MI s/p CABG in 11 (age 33) and DES to Cx 01/2021, mitral regurgitation (not felt to be a MitraClip candidate), chronic HFmrEF, BOOP with pneumomediastinum  06/2021 with subsequent chronic respiratory failure on home O2, asthma, dyslipidemia, venous stasis, diverticulosis, glaucoma, protein-calorie malnutrition, testicular CA s/p R orchiectomy 04/2021, RLE DVT, chronic appearing hyponatremia (reported presumed SIADH) who presented with acute on chronic anemia. Cardiology is consulted for pre-operative evaluation prior to EGD/[place]o.  Patient with known extensive cardiac history with CAD s/p CABG and PCI to Lcx in 01/2021 and moderate to severe MR deemed not to be a Mitra-Clip candidate. TTE 06/2021 with LVEF EF 40-45% (decreased from 60-65% in 2022),  mildly elevated PASP, moderate MR, mild-moderate TR. He has been having general decline over the past several months with new diagnosis of BOOP with chronic respiratory failure, failure to thrive with weight loss, RLE DVT on apixaban, pneumomediastinu. He was admitted with worsening anemia with concern for GIB in the setting of AC.  Overall, the patient is high risk for procedures based on his significant medical comorbiditie. He has no ischemic symptoms and is not decompensated from a HF standpoint. No indication for ischemic testing prior to procedure and likely his greatest risk is respiratory decompensation with anesthesia.   GEN: Thin, cachectic appearing, tachypneic but NAD Neck: No JVD Cardiac: RRR, 2/6 systolic murmur at apex Respiratory: Diffuse coarse crackles throughout lung fields GI: Soft, nontender, non-distended  MS: Thin, warm Neuro:  Nonfocal  Psych: Normal affect    Plan: -No further cardiac work-up needed prior to EGD/Colo as no ischemic symptoms and patient is not decompensated from HF standpoint -He is high risk based on significant medical comorbidities especially from a respiratory standpoint -He is not a candidate for intervention for MR; continue medical therapy -Holding [facility] given GIB; given resolution of DVT on LE doppler, favor not resuming due to significant risk of  re-bleeding -Continue coreg 3.'125mg'$  BID -Resume farxiga, lasix and spiro once more clinically stable -Overall, poor prognosis. Consider palliative care consult  Cardiology will sign-off. Please do not hesitate to call with questions or concerns.   Gwyndolyn Kaufman, MD

## 2021-09-29 NOTE — Plan of Care (Signed)
  Problem: Pain Managment: Goal: General experience of comfort will improve Outcome: Progressing   Problem: Safety: Goal: Ability to remain free from injury will improve Outcome: Progressing   

## 2021-09-29 NOTE — Progress Notes (Signed)
Initial Nutrition Assessment  DOCUMENTATION CODES:   Severe malnutrition in context of chronic illness  INTERVENTION:  Advance diet to regular after GI procedure tomorrow to allow maximum amount of dietary choices Ensure Enlive po TID, each supplement provides 350 kcal and 20 grams of protein. MVI with minerals daily Magic cup TID with meals, each supplement provides 290 kcal and 9 grams of protein  NUTRITION DIAGNOSIS:   Severe Malnutrition related to chronic illness (CHF) as evidenced by severe muscle depletion, severe fat depletion.  GOAL:   Patient will meet greater than or equal to 90% of their needs  MONITOR:   PO intake, Supplement acceptance, Diet advancement, Weight trends, I & O's  REASON FOR ASSESSMENT:   Malnutrition Screening Tool    ASSESSMENT:   Pt with hx of CHF, HTN, HLD, chronic respiratory failure, CAD, hx testicular cancer presented to ED after lab work showed he was quite anemic. Has been more SOB recently  Met with patient and family in room. Pt currently on a clear liquid diet in preparation for colonoscopy tomorrow.    Discussed recent nutrition hx. Wife reports that pt has lost ~30 lbs since he began to have his health issues earlier in the year. Significant deficits noted. Wife reports trying to adhere to low Na diet. Very careful to read food labels but states she was unsure of the amount to limit as she has seen some recommendations for 2.2g and others for as low as 1.5g. Encouraged a more liberal Na restriction as it can be difficult to obtain adequate nutrition and pt's intake has declined. Limited intake at the end of the day due to SOB. Encouraged largest meal to be in the AM and discussed ways to increase kcal and protein in meals and snacks.  Pt drinks ensure every day/every other day, likes chocolate.  NUTRITION - FOCUSED PHYSICAL EXAM:  Flowsheet Row Most Recent Value  Orbital Region Mild depletion  Upper Arm Region Severe depletion   Thoracic and Lumbar Region Severe depletion  Buccal Region Mild depletion  Temple Region Mild depletion  Clavicle Bone Region Severe depletion  Clavicle and Acromion Bone Region Severe depletion  Scapular Bone Region Moderate depletion  Dorsal Hand Severe depletion  Patellar Region Severe depletion  Anterior Thigh Region Severe depletion  Posterior Calf Region Severe depletion  Edema (RD Assessment) None  Hair Reviewed  Eyes Reviewed  Mouth Reviewed  Skin Reviewed  Nails Reviewed       Diet Order:   Diet Order             Diet clear liquid Room service appropriate? Yes; Fluid consistency: Thin  Diet effective now                   EDUCATION NEEDS:   Education needs have been addressed  Skin:  Skin Assessment: Reviewed RN Assessment  Last BM:  8/9  Height:   Ht Readings from Last 1 Encounters:  09/28/21 5' 9.02" (1.753 m)    Weight:   Wt Readings from Last 1 Encounters:  09/29/21 63.2 kg    Ideal Body Weight:  72.7 kg  BMI:  Body mass index is 20.57 kg/m.  Estimated Nutritional Needs:   Kcal:  1800-2000 kcal/d  Protein:  90-100 g/d  Fluid:  >/=2L/d    Ranell Patrick, RD, LDN Clinical Dietitian RD pager # available in AMION  After hours/weekend pager # available in [facility]

## 2021-09-30 ENCOUNTER — Inpatient Hospital Stay (HOSPITAL_COMMUNITY): Payer: PPO | Admitting: Anesthesiology

## 2021-09-30 ENCOUNTER — Encounter (HOSPITAL_COMMUNITY)
Admission: EM | Disposition: A | Discharge status: Home Health | Payer: Self-pay | Source: Home / Self Care | Attending: Internal Medicine

## 2021-09-30 DIAGNOSIS — K2100 Gastro-esophageal reflux disease with esophagitis, without bleeding: Secondary | ICD-10-CM | POA: Diagnosis not present

## 2021-09-30 DIAGNOSIS — K648 Other hemorrhoids: Secondary | ICD-10-CM

## 2021-09-30 DIAGNOSIS — K5730 Diverticulosis of large intestine without perforation or abscess without bleeding: Secondary | ICD-10-CM | POA: Diagnosis not present

## 2021-09-30 DIAGNOSIS — D509 Iron deficiency anemia, unspecified: Secondary | ICD-10-CM

## 2021-09-30 DIAGNOSIS — D649 Anemia, unspecified: Secondary | ICD-10-CM | POA: Diagnosis not present

## 2021-09-30 DIAGNOSIS — K449 Diaphragmatic hernia without obstruction or gangrene: Secondary | ICD-10-CM | POA: Diagnosis not present

## 2021-09-30 DIAGNOSIS — E43 Unspecified severe protein-calorie malnutrition: Secondary | ICD-10-CM | POA: Insufficient documentation

## 2021-09-30 DIAGNOSIS — I2510 Atherosclerotic heart disease of native coronary artery without angina pectoris: Secondary | ICD-10-CM

## 2021-09-30 DIAGNOSIS — I110 Hypertensive heart disease with heart failure: Secondary | ICD-10-CM

## 2021-09-30 DIAGNOSIS — K922 Gastrointestinal hemorrhage, unspecified: Secondary | ICD-10-CM | POA: Diagnosis not present

## 2021-09-30 DIAGNOSIS — I509 Heart failure, unspecified: Secondary | ICD-10-CM

## 2021-09-30 HISTORY — PX: COLONOSCOPY WITH PROPOFOL: SHX5780

## 2021-09-30 HISTORY — PX: ESOPHAGOGASTRODUODENOSCOPY (EGD) WITH PROPOFOL: SHX5813

## 2021-09-30 LAB — CBC
HCT: 31.4 % — ABNORMAL LOW (ref 39.0–52.0)
Hemoglobin: 9.1 g/dL — ABNORMAL LOW (ref 13.0–17.0)
MCH: 22.2 pg — ABNORMAL LOW (ref 26.0–34.0)
MCHC: 29 g/dL — ABNORMAL LOW (ref 30.0–36.0)
MCV: 76.8 fL — ABNORMAL LOW (ref 80.0–100.0)
Platelets: 441 10*3/uL — ABNORMAL HIGH (ref 150–400)
RBC: 4.09 MIL/uL — ABNORMAL LOW (ref 4.22–5.81)
RDW: 19.7 % — ABNORMAL HIGH (ref 11.5–15.5)
WBC: 11.5 10*3/uL — ABNORMAL HIGH (ref 4.0–10.5)
nRBC: 0.7 % — ABNORMAL HIGH (ref 0.0–0.2)

## 2021-09-30 LAB — BASIC METABOLIC PANEL
Anion gap: 6 (ref 5–15)
BUN: 9 mg/dL (ref 8–23)
CO2: 27 mmol/L (ref 22–32)
Calcium: 8.3 mg/dL — ABNORMAL LOW (ref 8.9–10.3)
Chloride: 98 mmol/L (ref 98–111)
Creatinine, Ser: 0.67 mg/dL (ref 0.61–1.24)
GFR, Estimated: >60 mL/min (ref >60)
Glucose, Bld: 85 mg/dL (ref 70–99)
Potassium: 3.7 mmol/L (ref 3.5–5.1)
Sodium: 131 mmol/L — ABNORMAL LOW (ref 135–145)

## 2021-09-30 SURGERY — ESOPHAGOGASTRODUODENOSCOPY (EGD) WITH PROPOFOL
Anesthesia: Monitor Anesthesia Care

## 2021-09-30 MED ORDER — PHENYLEPHRINE HCL-NACL 20-0.9 MG/250ML-% IV SOLN
INTRAVENOUS | Status: DC | PRN
  Administered 2021-09-30: 45 ug/min via INTRAVENOUS

## 2021-09-30 MED ORDER — PHENYLEPHRINE 80 MCG/ML (10ML) SYRINGE FOR IV PUSH (FOR BLOOD PRESSURE SUPPORT)
PREFILLED_SYRINGE | INTRAVENOUS | Status: DC | PRN
  Administered 2021-09-30: 160 ug via INTRAVENOUS

## 2021-09-30 MED ORDER — LACTATED RINGERS IV SOLN
INTRAVENOUS | Status: AC | PRN
  Administered 2021-09-30: 20 mL/h via INTRAVENOUS

## 2021-09-30 MED ORDER — PANTOPRAZOLE SODIUM 40 MG IV SOLR
40.0000 mg | Freq: Two times a day (BID) | INTRAVENOUS | Status: DC
  Administered 2021-09-30 – 2021-10-01 (×3): 40 mg via INTRAVENOUS
  Filled 2021-09-30 (×3): qty 10

## 2021-09-30 MED ORDER — PROPOFOL 10 MG/ML IV BOLUS
INTRAVENOUS | Status: DC | PRN
  Administered 2021-09-30: 40 mg via INTRAVENOUS

## 2021-09-30 MED ORDER — PROPOFOL 500 MG/50ML IV EMUL
INTRAVENOUS | Status: DC | PRN
  Administered 2021-09-30: 75 ug/kg/min via INTRAVENOUS

## 2021-09-30 MED ORDER — SODIUM CHLORIDE 0.9 % IV SOLN
INTRAVENOUS | Status: DC
Start: 2021-09-30 — End: 2021-09-30

## 2021-09-30 MED ORDER — METHYLPREDNISOLONE SODIUM SUCC 125 MG IJ SOLR: 125.0000 mg | Freq: Once | INTRAMUSCULAR | Status: DC

## 2021-09-30 MED ORDER — ALBUMIN HUMAN 5 % IV SOLN: INTRAVENOUS | Status: DC | PRN

## 2021-09-30 SURGICAL SUPPLY — 25 items

## 2021-09-30 NOTE — Brief Op Note (Signed)
09/28/2021 - 09/30/2021  9:20 AM  PATIENT:  [name]  71 y.o. male  PRE-OPERATIVE DIAGNOSIS:  anemia, hemoccult-positive stools  POST-OPERATIVE DIAGNOSIS:  EGD: esophagitis COLON: hemorrhoids. diverticulosis  PROCEDURE:  Procedure(s): ESOPHAGOGASTRODUODENOSCOPY (EGD) WITH PROPOFOL (N/A) COLONOSCOPY WITH PROPOFOL (N/A)  SURGEON:  Surgeon(s) and Role:    * Juana Haralson, MD - Primary  Findings ------------ -EGD showed LA grade B esophagitis.  No evidence of active bleeding. -Colonoscopy also showed no evidence of active bleeding.  Normal TI, diverticulosis and hemorrhoids seen.  Recommendations ------------------------- -Start heart healthy diet -Protonix 40 mg twice a day for 4 weeks followed by Protonix 40 mg once a day -No further inpatient GI work-up planned. -Need to resume anticoagulation from cardiac standpoint. -GI will sign off.  Call us back if needed -Discussed with patient's wife over the phone.  Otis Brace MD, FACP 09/30/2021, 9:22 AM  Contact #  [phone]

## 2021-09-30 NOTE — Plan of Care (Signed)
  Problem: Clinical Measurements: Goal: Ability to maintain clinical measurements within normal limits will improve Outcome: Progressing   

## 2021-09-30 NOTE — Transfer of Care (Signed)
Immediate Anesthesia Transfer of Care Note  Patient: Roberto Ponce  Procedure(s) Performed: ESOPHAGOGASTRODUODENOSCOPY (EGD) WITH PROPOFOL COLONOSCOPY WITH PROPOFOL  Patient Location: Endoscopy Unit  Anesthesia Type:MAC  Level of Consciousness: drowsy  Airway & Oxygen Therapy: Patient Spontanous Breathing and Patient connected to nasal cannula oxygen  Post-op Assessment: Report given to RN, Post -op Vital signs reviewed and stable and Patient moving all extremities X 4  Post vital signs: Reviewed and stable  Last Vitals:  Vitals Value Taken Time  BP    Temp    Pulse 96 09/30/21 0854  Resp 16 09/30/21 0855  SpO2 100 % 09/30/21 0854  Vitals shown include unvalidated device data.  Last Pain:  Vitals:   09/30/21 0731  TempSrc: Temporal  PainSc: 0-No pain         Complications: No notable events documented.

## 2021-09-30 NOTE — Op Note (Signed)
[facility] Patient Name: Roberto Ponce Procedure Date : 09/30/2021 MRN: [id number] Attending MD: Otis Brace , MD Date of Birth: May 03, 1950 CSN: [id number] Age: 71 Admit Type: Inpatient Procedure:                Colonoscopy Indications:              Heme positive stool, Iron deficiency anemia Providers:                Otis Brace, MD, Grace Isaac, RN, William Dalton, Technician Referring MD:              Medicines:                Sedation Administered by an Anesthesia Professional Complications:            No immediate complications. Estimated Blood Loss:     Estimated blood loss was minimal. Procedure:                Pre-Anesthesia Assessment:                           - Prior to the procedure, a History and Physical                            was performed, and patient medications and                            allergies were reviewed. The patient's tolerance of                            previous anesthesia was also reviewed. The risks                            and benefits of the procedure and the sedation                            options and risks were discussed with the patient.                            All questions were answered, and informed consent                            was obtained. Prior Anticoagulants: The patient has                            taken Eliquis (apixaban), last dose was 2 days                            prior to procedure. ASA Grade Assessment: IV - A                            patient with severe systemic disease that is a  constant threat to life. After reviewing the risks                            and benefits, the patient was deemed in                            satisfactory condition to undergo the procedure.                           After obtaining informed consent, the colonoscope                            was passed under direct vision. Throughout the                             procedure, the patient's blood pressure, pulse, and                            oxygen saturations were monitored continuously. The                            [id number]-HQ190TL ([id number]) Olympus peds colonoscope was                            introduced through the anus and advanced to the the                            terminal ileum, with identification of the                            appendiceal orifice and IC valve. The colonoscopy                            was performed without difficulty. The patient                            tolerated the procedure well. The quality of the                            bowel preparation was good. Scope In: 8:35:21 AM Scope Out: 8:46:58 AM Scope Withdrawal Time: 0 hours 5 minutes 26 seconds  Total Procedure Duration: 0 hours 11 minutes 37 seconds  Findings:      Hemorrhoids were found on perianal exam.      The terminal ileum appeared normal.      Multiple small and large-mouthed diverticula were found in the sigmoid       colon.      Internal hemorrhoids were found during retroflexion. The hemorrhoids       were medium-sized. Impression:               - Hemorrhoids found on perianal exam.                           - The examined portion of the ileum was normal.                           -  Diverticulosis in the sigmoid colon.                           - Internal hemorrhoids.                           - No specimens collected. Recommendation:           - Return patient to hospital ward for ongoing care.                           - Cardiac diet.                           - Continue present medications. Procedure Code(s):        --- Professional ---                           [phone], Colonoscopy, flexible; diagnostic, including                            collection of specimen(s) by brushing or washing,                            when performed (separate procedure) Diagnosis Code(s):        --- Professional ---                            K64.8, Other hemorrhoids                           R19.5, Other fecal abnormalities                           D50.9, Iron deficiency anemia, unspecified                           K57.30, Diverticulosis of large intestine without                            perforation or abscess without bleeding CPT copyright 2019 [employer]. All rights reserved. The codes documented in this report are preliminary and upon coder review may  be revised to meet current compliance requirements. Otis Brace, MD Otis Brace, MD 09/30/2021 9:05:51 AM Number of Addenda: 0

## 2021-09-30 NOTE — Anesthesia Postprocedure Evaluation (Signed)
Anesthesia Post Note  Patient: Roberto Ponce  Procedure(s) Performed: ESOPHAGOGASTRODUODENOSCOPY (EGD) WITH PROPOFOL COLONOSCOPY WITH PROPOFOL     Patient location during evaluation: Endoscopy Anesthesia Type: MAC Level of consciousness: oriented, awake and alert and awake Pain management: pain level controlled Vital Signs Assessment: post-procedure vital signs reviewed and stable Respiratory status: spontaneous breathing, nonlabored ventilation, respiratory function stable and patient connected to nasal cannula oxygen Cardiovascular status: blood pressure returned to baseline and stable Postop Assessment: no headache, no backache and no apparent nausea or vomiting Anesthetic complications: no   No notable events documented.  Last Vitals:  Vitals:   09/30/21 0955 09/30/21 1017  BP: 98/61 97/64  Pulse: 93 97  Resp: 19 18  Temp: 36.5 C 36.6 C  SpO2:      Last Pain:  Vitals:   09/30/21 1058  TempSrc:   PainSc: 0-No pain                 Santa Lighter

## 2021-09-30 NOTE — Progress Notes (Signed)
PROGRESS NOTE    Roberto Ponce  DEY:[id number] DOB: 03-07-1950 DOA: 09/28/2021 PCP: Kristen Loader, FNP    Brief Narrative:  Roberto Ponce is a 71 y.o. 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   Patient was recently started on Eliquis for RLE DVT found on last admission in May 2023.  Patient has history of diverticulosis diagnosed with routine screening colonoscopy back in 2017 but never had diverticulosis bleeding or diverticulitis before.  Patient was doing okay with the Eliquis denies any abdominal pain no blood in the stool no dark-colored stool.  However he does feel increasing lethargy and went to see PCP today who did a blood work showed hemoglobin= 6.1 and sent to ED.  No chest pain no cough.   Patient has BOOP of the left lung and recently completed Bactrim treatment for 3 weeks and still on prednisone tapering and now is on 15 mg daily.   Assessment and Plan:  Acute on chronic iron deficiency anemia, symptomatic -Likely has a slow undetected GI bleed. -PPI BID -PRBC x1 - GI consulted- s/p EGD/colonoscopy: EGD showed LA grade B esophagitis.  No evidence of active bleeding. -Colonoscopy also showed no evidence of active bleeding.  Normal TI, diverticulosis and hemorrhoids seen. -Protonix 40 mg twice a day for 4 weeks followed by Protonix 40 mg once a day -No further inpatient GI work-up planned. -Need to resume anticoagulation from cardiac standpoint -Venofer x 1    Recent DVT - last admission discharge summary considered his DVT is provoked by testicle or cancer  however patient underwent orchiectomy in March and was cleared by oncology afterwards that " everything was removed". -duplex negative for DVT currently-- suspected provoked clot due to hospitalization/not moving -will not resume eliquis   Chronic HFrEF -Stable, clinically appears to be euvolemic -Continue 3 times a week Lasix.   HTN -Blood pressure borderline low, holding parameters for BP meds   Left lung BOOP -Off antibiotics, continue steroid tapering- down to '15mg'$  -if hypotension continues, may need stress dose steroids   -on 2L [place] at baseline   Chronic hyponatremia -Stable -labs in AM   Severe to moderate MR -Cardiology consult  Low normal B12 -replace PO  Labs in AM and PT eval  DVT prophylaxis: SCDs Start: 09/28/21 1921    Code Status: Full Code Family Communication: wife at bedside  Disposition Plan:  Level of care: Telemetry Medical Status is: Inpatient Remains inpatient appropriate because: home 24-48 hours    Consultants:  GI  Cards PCCM (phone)   Subjective: No current complaints  Objective: Vitals:   09/30/21 0910 09/30/21 0920 09/30/21 0955 09/30/21 1017  BP: 110/69 1'14/61 98/61 97/64 '$  Pulse: (!) 102 98 93 97  Resp: (!) 23 (!) '27 19 18  '$ Temp:   97.7 F (36.5 C) 97.8 F (36.6 C)  TempSrc:   Oral Oral  SpO2: 100% 100%    Weight:      Height:        Intake/Output Summary (Last 24 hours) at 09/30/2021 1309 Last data filed at 09/30/2021 0846 Gross per 24 hour  Intake 1788.94 ml  Output --  Net 1788.94 ml   [employer]  09/28/21 2300 09/29/21 0500  Weight: 63.2 kg 63.2 kg    Examination:    General: Appearance:    Chronically ill appearing male in no acute distress     Lungs:     respirations unlabored  Heart:    Normal heart rate.   MS:   All extremities are intact.   Neurologic:   Awake, alert         Data Reviewed: I have personally reviewed following labs and imaging studies  CBC: Recent Labs  Lab  09/28/21 1420 09/29/21 0123 09/30/21 0100  WBC 12.0* 8.0 11.5*  HGB 6.5* 7.7* 9.1*  HCT 23.2* 26.1* 31.4*  MCV 73.7* 75.9* 76.8*  PLT 468* 357 448*   Basic Metabolic Panel: Recent Labs  Lab 09/28/21 1420 09/30/21 0702  NA 131* 131*  K 4.8 3.7  CL 93* 98  CO2 30 27  GLUCOSE 105* 85  BUN 15 9  CREATININE 0.60* 0.67  CALCIUM 8.6* 8.3*   GFR: Estimated Creatinine Clearance: 76.8 mL/min (by C-G formula based on SCr of 0.67 mg/dL). Liver Function Tests: Recent Labs  Lab 09/28/21 1420  AST 17  ALT 21  ALKPHOS 55  BILITOT 0.5  PROT 6.2*  ALBUMIN 3.1*   No results for input(s): "LIPASE", "AMYLASE" in the last 168 hours. No results for input(s): "AMMONIA" in the last 168 hours. Coagulation Profile: No results for input(s): "INR", "PROTIME" in the last 168 hours. Cardiac Enzymes: No results for input(s): "CKTOTAL", "CKMB", "CKMBINDEX", "TROPONINI" in the last 168 hours. BNP (last 3 results) No results for input(s): "PROBNP" in the last 8760 hours. HbA1C: No results for input(s): "HGBA1C" in the last 72 hours. CBG: No results for input(s): "GLUCAP" in the last 168 hours. Lipid Profile: No results for input(s): "CHOL", "HDL", "LDLCALC", "TRIG", "CHOLHDL", "LDLDIRECT" in the last 72 hours. Thyroid Function Tests: No results for input(s): "TSH", "T4TOTAL", "FREET4", "T3FREE", "THYROIDAB" in the last 72 hours. Anemia Panel: Recent Labs    09/28/21 1420  VITAMINB12 180  FOLATE 15.1  FERRITIN 6*  TIBC 295  IRON 13*  RETICCTPCT 3.5*   Sepsis Labs: No results for input(s): "PROCALCITON", "LATICACIDVEN" in the last 168 hours.  No results found for this or any previous visit (from the past 240 hour(s)).       Radiology Studies: VAS [place] LOWER EXTREMITY VENOUS (DVT)  Result Date: 09/30/2021  Lower Venous DVT Study Patient Name:  Roberto Ponce  Date of Exam:   09/29/2021 Medical Rec #: [id number]            Accession #:    [id number] Date of Birth: Aug 24, 1950             Patient Gender: M Patient Age:   76 years Exam Location:  [facility] Procedure:      VAS [place] LOWER EXTREMITY VENOUS (DVT) Referring Phys: Wynetta Fines --------------------------------------------------------------------------------  Indications: Hx of DVT, and Swelling.  Comparison Study: 07/05/21, positive for DVT on the right in the popliteal, PTV,                   pero and gastroc veins. Resolved on this exam. Performing Technologist: Bobetta Lime BS, RVT  Examination Guidelines: A complete evaluation includes B-mode imaging, spectral Doppler, color Doppler, and power Doppler as needed of all accessible portions of each vessel. Bilateral testing is considered an integral part of a complete examination. Limited examinations for reoccurring indications may be performed as noted. The reflux portion of the exam is  performed with the patient in reverse Trendelenburg.  +---------+---------------+---------+-----------+----------+--------------+ RIGHT    CompressibilityPhasicitySpontaneityPropertiesThrombus Aging +---------+---------------+---------+-----------+----------+--------------+ CFV      Full           Yes      Yes                                 +---------+---------------+---------+-----------+----------+--------------+ SFJ      Full                                                        +---------+---------------+---------+-----------+----------+--------------+ FV Prox  Full                                                        +---------+---------------+---------+-----------+----------+--------------+ FV Mid   Full                                                        +---------+---------------+---------+-----------+----------+--------------+ FV DistalFull                                                        +---------+---------------+---------+-----------+----------+--------------+ PFV      Full                                                         +---------+---------------+---------+-----------+----------+--------------+ POP      Full           Yes      Yes                                 +---------+---------------+---------+-----------+----------+--------------+ PTV      Full                    Yes                                 +---------+---------------+---------+-----------+----------+--------------+ PERO     Full                    Yes                                 +---------+---------------+---------+-----------+----------+--------------+   +---------+---------------+---------+-----------+----------+--------------+ LEFT     CompressibilityPhasicitySpontaneityPropertiesThrombus Aging +---------+---------------+---------+-----------+----------+--------------+ CFV      Full           Yes      Yes                                 +---------+---------------+---------+-----------+----------+--------------+  SFJ      Full                                                        +---------+---------------+---------+-----------+----------+--------------+ FV Prox  Full                                                        +---------+---------------+---------+-----------+----------+--------------+ FV Mid   Full                                                        +---------+---------------+---------+-----------+----------+--------------+ FV DistalFull                                                        +---------+---------------+---------+-----------+----------+--------------+ PFV      Full                                                        +---------+---------------+---------+-----------+----------+--------------+ POP      Full           Yes      Yes                                 +---------+---------------+---------+-----------+----------+--------------+ PTV      Full                                                         +---------+---------------+---------+-----------+----------+--------------+ PERO     Full                    Yes                                 +---------+---------------+---------+-----------+----------+--------------+     Summary: BILATERAL: - No evidence of deep vein thrombosis seen in the lower extremities, bilaterally. -No evidence of popliteal cyst, bilaterally. RIGHT: - Findings suggest resolution of previously noted thrombus.   *See table(s) above for measurements and observations. Electronically signed by Orlie Pollen on 09/30/2021 at 12:28:08 PM.    Final    DG Chest Portable 1 View  Result Date: 09/28/2021 CLINICAL DATA:  Tachypnea, shortness of breath EXAM: PORTABLE CHEST 1 VIEW COMPARISON:  09/01/2021 FINDINGS: Diffuse airspace disease throughout the left lung compatible with fibrotic changes seen on prior CT and plain films, unchanged. No confluent opacity on the right. Prior median sternotomy and CABG.  Heart is normal size. Aortic atherosclerosis. No acute bony abnormality. IMPRESSION: Stable chronic fibrotic changes most pronounced in the left lung. No acute cardiopulmonary disease. Electronically Signed   By: Rolm Baptise M.D.   On: 09/28/2021 17:29        Scheduled Meds:  aspirin EC  81 mg Oral Daily   bisacodyl  10 mg Rectal Once   brimonidine  1 drop Left Eye BID   carvedilol  3.125 mg Oral BID WC   vitamin B-12  1,000 mcg Oral Daily   dorzolamide-timolol  1 drop Both Eyes BID   ezetimibe  10 mg Oral Daily   feeding supplement  237 mL Oral TID BM   latanoprost  1 drop Both Eyes QHS   multivitamin with minerals  1 tablet Oral Daily   pantoprazole (PROTONIX) IV  40 mg Intravenous Q12H   predniSONE  15 mg Oral Q breakfast   sodium chloride flush  3 mL Intravenous Q12H   Continuous Infusions:  sodium chloride     sodium chloride       LOS: 2 days    Time spent: 45 minutes spent on chart review, discussion with nursing staff, consultants, updating family and  interview/physical exam; more than 50% of that time was spent in counseling and/or coordination of care.    Geradine Girt, DO [facility] Available via Epic secure chat 7am-7pm After these hours, please refer to coverage provider listed on [email or web address] 09/30/2021, 1:09 PM

## 2021-09-30 NOTE — Progress Notes (Signed)
   09/30/21 1547  Assess: MEWS Score  Temp 98.2 F (36.8 C)  BP (!) 100/57  MAP (mmHg) 70  Pulse Rate (!) 103  Resp 20  Level of Consciousness Alert  SpO2 100 %  O2 Device Nasal Cannula  O2 Flow Rate (L/min) 2 L/min  Assess: MEWS Score  MEWS Temp 0  MEWS Systolic 1  MEWS Pulse 1  MEWS RR 0  MEWS LOC 0  MEWS Score 2  MEWS Score Color Yellow  Assess: if the MEWS score is Yellow or Red  Were vital signs taken at a resting state? Yes  Focused Assessment No change from prior assessment  Does the patient meet 2 or more of the SIRS criteria? No  MEWS guidelines implemented *See Row Information* Yes  Treat  MEWS Interventions Escalated (See documentation below)  Pain Scale 0-10  Pain Score 0  Take Vital Signs  Increase Vital Sign Frequency  Yellow: Q 2hr X 2 then Q 4hr X 2, if remains yellow, continue Q 4hrs  Escalate  MEWS: Escalate Yellow: discuss with charge nurse/RN and consider discussing with provider and RRT  Notify: Charge Nurse/RN  Name of Charge Nurse/RN Notified Valliant, RN  Date Charge Nurse/RN Notified 09/30/21  Notify: Provider  Provider Name/Title Eulogio Bear, DO  Date Provider Notified 09/30/21  Time Provider Notified 1552  Method of Notification Page  Notification Reason Other (Comment) (Elevated Pulse rate of 104. Yellow [place])  Provider response See new orders  Date of Provider Response 09/30/21  Time of Provider Response 1555  Assess: SIRS CRITERIA  SIRS Temperature  0  SIRS Pulse 1  SIRS Respirations  0  SIRS WBC 1  SIRS Score Sum  2

## 2021-09-30 NOTE — Progress Notes (Signed)
[facility] Progress Note  Roberto Ponce 71 y.o. [birth year]  CC:   Anemia, heme positive stool   Subjective: Patient seen and examined at bedside in the Endo unit.  No acute issues overnight.  ROS : Positive for weakness, positive for shortness of breath   Objective: Vital signs in last 24 hours: Vitals:   09/30/21 0516 09/30/21 0731  BP: 103/62 105/64  Pulse: 88 97  Resp: 17 (!) 23  Temp: 97.6 F (36.4 C) (!) 97.5 F (36.4 C)  SpO2: 100% 100%    Physical Exam:  General -elderly patient, not in acute distress Abdomen -soft, nontender, nondistended, bowel sounds present, no peritoneal signs Neuro -Alert and oriented x3 Psych -mood and affect normal   Lab Results: Recent Labs    09/28/21 1420 09/30/21 0702  NA 131* 131*  K 4.8 3.7  CL 93* 98  CO2 30 27  GLUCOSE 105* 85  BUN 15 9  CREATININE 0.60* 0.67  CALCIUM 8.6* 8.3*   Recent Labs    09/28/21 1420  AST 17  ALT 21  ALKPHOS 55  BILITOT 0.5  PROT 6.2*  ALBUMIN 3.1*   Recent Labs    09/29/21 0123 09/30/21 0100  WBC 8.0 11.5*  HGB 7.7* 9.1*  HCT 26.1* 31.4*  MCV 75.9* 76.8*  PLT 357 441*   No results for input(s): "LABPROT", "INR" in the last 72 hours.    Assessment/Plan: -Anemia with heme positive stool.  Hemoglobin was down to 6.1.  HGB improved with blood transfusion -History of DVT.  Anticoagulation on hold. -History of coronary disease and CHF -bronchiolitis obliterans organizing pneumonia (BOOP) on steroid  Recommendations -------------------------- -Appreciate cardiology input.  Cleared for EGD and colonoscopy from cardiac standpoint.   - According to cardiology, overall prognosis is poor and they have recommended palliative care consult -Also according to cardiology, no need to resume anticoagulation given resolution of DVT on repeat Doppler ultrasound -Proceed with EGD and colonoscopy today.  Risks (bleeding, infection, bowel perforation that could require  surgery, sedation-related changes in cardiopulmonary systems), benefits (identification and possible treatment of source of symptoms, exclusion of certain causes of symptoms), and alternatives (watchful waiting, radiographic imaging studies, empiric medical treatment)  were explained to patient/family in detail and patient wishes to proceed.     Otis Brace MD, [place] 09/30/2021, 8:13 AM  Contact #  [phone]

## 2021-09-30 NOTE — Anesthesia Preprocedure Evaluation (Addendum)
Anesthesia Evaluation  Patient identified by MRN, date of birth, ID band Patient awake    Reviewed: Allergy & Precautions, NPO status , Patient's Chart, lab work & pertinent test results, reviewed documented beta blocker date and time   Airway Mallampati: II  TM Distance: >3 FB Neck ROM: Full    Dental  (+) Teeth Intact, Dental Advisory Given   Pulmonary asthma , pneumonia (home O2),  Left lung BOOP -Off antibiotics, steroid tapering- down to 69m -PCCM: may need stress dose steroids if develops hypotension.   -on 2L [place]   Pulmonary exam normal breath sounds clear to auscultation       Cardiovascular hypertension, Pt. on home beta blockers and Pt. on medications + CAD, + Past MI, + Cardiac Stents, + CABG, +CHF and + DVT  Normal cardiovascular exam+ Valvular Problems/Murmurs MR  Rhythm:Regular Rate:Normal  Echo 07/05/21: 1. Left ventricular ejection fraction, by estimation, is 40 to 45%. The  left ventricle has mildly decreased function. The left ventricle  demonstrates regional wall motion abnormalities (see scoring  diagram/findings for description). There is mild  concentric left ventricular hypertrophy. Left ventricular diastolic  parameters are indeterminate. There is akinesis of the left ventricular,  basal-mid inferior wall. There is hypokinesis of the left ventricular,  mid-apical inferolateral wall and inferior  wall.  2. Right ventricular systolic function is normal. The right ventricular  size is normal. There is mildly elevated pulmonary artery systolic  pressure.  3. The mitral valve is abnormal. Mild to moderate mitral valve  regurgitation.  4. Tricuspid valve regurgitation is mild to moderate.  5. The aortic valve is normal in structure. Aortic valve regurgitation is  not visualized. No aortic stenosis is present.    Neuro/Psych negative neurological ROS     GI/Hepatic negative GI ROS, Neg liver ROS,    Endo/Other  diabetes, Type 2, Oral Hypoglycemic Agents  Renal/GU negative Renal ROS   testicular CA s/p R orchiectomy 04/2021    Musculoskeletal negative musculoskeletal ROS (+)   Abdominal   Peds  Hematology  (+) Blood dyscrasia (Eliquis), anemia ,   Anesthesia Other Findings   Reproductive/Obstetrics                            Anesthesia Physical Anesthesia Plan  ASA: 4  Anesthesia Plan: MAC   Post-op Pain Management: Minimal or no pain anticipated   Induction: Intravenous  PONV Risk Score and Plan: 1 and TIVA and Treatment may vary due to age or medical condition  Airway Management Planned: Natural Airway and Nasal Cannula  Additional Equipment:   Intra-op Plan:   Post-operative Plan:   Informed Consent: I have reviewed the patients History and Physical, chart, labs and discussed the procedure including the risks, benefits and alternatives for the proposed anesthesia with the patient or authorized representative who has indicated his/her understanding and acceptance.     Dental advisory given  Plan Discussed with: CRNA  Anesthesia Plan Comments:        Anesthesia Quick Evaluation

## 2021-09-30 NOTE — Progress Notes (Signed)
Patient refused bed alarm. Falls education done.

## 2021-09-30 NOTE — Plan of Care (Signed)
  Problem: Nutrition: Goal: Adequate nutrition will be maintained Outcome: Progressing   Problem: Pain Managment: Goal: General experience of comfort will improve Outcome: Progressing   Problem: Safety: Goal: Ability to remain free from injury will improve Outcome: Progressing   

## 2021-09-30 NOTE — Op Note (Signed)
[facility] Patient Name: Roberto Ponce Procedure Date : 09/30/2021 MRN: [id number] Attending MD: Otis Brace , MD Date of Birth: 07-19-1950 CSN: [id number] Age: 71 Admit Type: Inpatient Procedure:                Upper GI endoscopy Indications:              Iron deficiency anemia, Heme positive stool Providers:                Otis Brace, MD, Grace Isaac, RN, William Dalton, Technician Referring MD:              Medicines:                Sedation Administered by an Anesthesia Professional Complications:            No immediate complications. Estimated Blood Loss:     Estimated blood loss was minimal. Procedure:                Pre-Anesthesia Assessment:                           - Prior to the procedure, a History and Physical                            was performed, and patient medications and                            allergies were reviewed. The patient's tolerance of                            previous anesthesia was also reviewed. The risks                            and benefits of the procedure and the sedation                            options and risks were discussed with the patient.                            All questions were answered, and informed consent                            was obtained. Prior Anticoagulants: The patient has                            taken Eliquis (apixaban), last dose was 2 days                            prior to procedure. ASA Grade Assessment: IV - A                            patient with severe systemic disease that is a  constant threat to life. After reviewing the risks                            and benefits, the patient was deemed in                            satisfactory condition to undergo the procedure.                           After obtaining informed consent, the endoscope was                            passed under direct vision. Throughout  the                            procedure, the patient's blood pressure, pulse, and                            oxygen saturations were monitored continuously. The                            [id number]-[id number] ([id number]) Olympus endoscope was introduced                            through the mouth, and advanced to the second part                            of duodenum. The upper GI endoscopy was                            accomplished without difficulty. The patient                            tolerated the procedure well. Scope In: Scope Out: Findings:      LA Grade B (one or more mucosal breaks greater than 5 mm, not extending       between the tops of two mucosal folds) esophagitis with no bleeding was       found in the distal esophagus.      A small hiatal hernia was present.      No gross lesions were noted in the entire examined stomach.      The cardia and gastric fundus were normal on retroflexion.      The duodenal bulb, first portion of the duodenum and second portion of       the duodenum were normal. Impression:               - LA Grade B reflux esophagitis with no bleeding.                           - Small hiatal hernia.                           - No gross lesions in the stomach.                           -  Normal duodenal bulb, first portion of the                            duodenum and second portion of the duodenum.                           - No specimens collected. Recommendation:           - Perform a colonoscopy today.                           - Use Protonix (pantoprazole) 40 mg PO BID. Procedure Code(s):        --- Professional ---                           [phone], Esophagogastroduodenoscopy, flexible,                            transoral; diagnostic, including collection of                            specimen(s) by brushing or washing, when performed                            (separate procedure) Diagnosis Code(s):        --- Professional ---                            K21.00, Gastro-esophageal reflux disease with                            esophagitis, without bleeding                           K44.9, Diaphragmatic hernia without obstruction or                            gangrene                           D50.9, Iron deficiency anemia, unspecified                           R19.5, Other fecal abnormalities CPT copyright 2019 [employer]. All rights reserved. The codes documented in this report are preliminary and upon coder review may  be revised to meet current compliance requirements. Otis Brace, MD Otis Brace, MD 09/30/2021 8:56:47 AM Number of Addenda: 0

## 2021-10-01 ENCOUNTER — Encounter (HOSPITAL_COMMUNITY): Payer: Self-pay | Admitting: Gastroenterology

## 2021-10-01 DIAGNOSIS — K922 Gastrointestinal hemorrhage, unspecified: Secondary | ICD-10-CM | POA: Diagnosis not present

## 2021-10-01 LAB — CBC
HCT: 27.1 % — ABNORMAL LOW (ref 39.0–52.0)
Hemoglobin: 7.8 g/dL — ABNORMAL LOW (ref 13.0–17.0)
MCH: 22.5 pg — ABNORMAL LOW (ref 26.0–34.0)
MCHC: 28.8 g/dL — ABNORMAL LOW (ref 30.0–36.0)
MCV: 78.1 fL — ABNORMAL LOW (ref 80.0–100.0)
Platelets: 386 10*3/uL (ref 150–400)
RBC: 3.47 MIL/uL — ABNORMAL LOW (ref 4.22–5.81)
RDW: 21.3 % — ABNORMAL HIGH (ref 11.5–15.5)
WBC: 8.5 10*3/uL (ref 4.0–10.5)
nRBC: 0.6 % — ABNORMAL HIGH (ref 0.0–0.2)

## 2021-10-01 LAB — BASIC METABOLIC PANEL
Anion gap: 5 (ref 5–15)
BUN: 7 mg/dL — ABNORMAL LOW (ref 8–23)
CO2: 31 mmol/L (ref 22–32)
Calcium: 8.3 mg/dL — ABNORMAL LOW (ref 8.9–10.3)
Chloride: 98 mmol/L (ref 98–111)
Creatinine, Ser: 0.63 mg/dL (ref 0.61–1.24)
GFR, Estimated: >60 mL/min (ref >60)
Glucose, Bld: 113 mg/dL — ABNORMAL HIGH (ref 70–99)
Potassium: 3.9 mmol/L (ref 3.5–5.1)
Sodium: 134 mmol/L — ABNORMAL LOW (ref 135–145)

## 2021-10-01 MED ORDER — PANTOPRAZOLE SODIUM 40 MG PO TBEC: DELAYED_RELEASE_TABLET | ORAL | 1 refills | Status: DC

## 2021-10-01 MED ORDER — CYANOCOBALAMIN 1000 MCG PO TABS: 1000.0000 ug | ORAL_TABLET | Freq: Every day | ORAL | Status: DC

## 2021-10-01 NOTE — Discharge Summary (Signed)
Physician Discharge Summary  Roberto Ponce:[id number] DOB: [birth year] DOA: 09/28/2021  PCP: Kristen Loader, FNP  Admit date: 09/28/2021 Discharge date: 10/01/2021  Admitted From: home Discharge disposition: home   Recommendations for Outpatient Follow-Up:   Cbc at next appointment Consider outpatient palliative care referral Stopped eliquis--- monitor for blood clots- may need hematology referral   Discharge Diagnosis:   Principal Problem:   GI bleed Active Problems:   Symptomatic anemia   Protein-calorie malnutrition, severe    Discharge Condition: Improved.  Diet recommendation:  Regular.  Wound care: None.  Code status: Full.   History of Present Illness:   Roberto Ponce is a 71 y.o. 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   Patient was recently started on Eliquis for RLE DVT found on last admission in May 2023.  Patient has history of diverticulosis diagnosed with routine screening colonoscopy back in 2017 but never had diverticulosis bleeding or diverticulitis before.  Patient was doing okay with the Eliquis denies any abdominal pain no blood in the stool no dark-colored stool.  However he does feel increasing lethargy and went to see PCP today who did a blood work showed hemoglobin= 6.1 and sent to ED.  No chest pain no cough.   Patient has BOOP of the left lung and recently completed Bactrim treatment for 3 weeks and still on prednisone tapering and now is on 50 mg daily.     Hospital Course by Problem:   Acute on chronic iron  deficiency anemia, symptomatic -Likely has a slow undetected GI bleed. -PRBC x1 - GI consulted- s/p EGD/colonoscopy: EGD showed LA grade B esophagitis.  No evidence of active bleeding. -Colonoscopy also showed no evidence of active bleeding.  Normal TI, diverticulosis and hemorrhoids seen. -Protonix 40 mg twice a day for 4 weeks followed by Protonix 40 mg once a day -No further inpatient GI work-up planned. -Need to resume anticoagulation from cardiac standpoint-asa 81 mg -Venofer x 1    Recent DVT - last admission discharge summary considered his DVT is provoked by testicle or cancer however patient underwent orchiectomy in March and was cleared by oncology afterwards that " everything was removed". -duplex negative for DVT currently-- suspected provoked clot due to hospitalization/not moving -will not resume eliquis   Chronic HFrEF -Stable, clinically appears to be euvolemic -Continue 3 times a week Lasix.   HTN -Blood pressure borderline low   Left lung BOOP -Off antibiotics, continue steroid tapering- down to '15mg'$  -on 2L Levasy at baseline   Chronic hyponatremia -Stable    Severe to moderate MR -Cardiology consult appreciated    Low normal B12 -replace PO    Medical Consultants:   GI cards   Discharge Exam:   Vitals:   10/01/21 0607 10/01/21 0937  BP: 105/66 118/67  Pulse: 86 (!) 102  Resp: 19 16  Temp: 98.3 F (36.8 C) 98 F (36.7 C)  SpO2:  100%   Vitals:   09/30/21 2226 10/01/21 0205 10/01/21 0607 10/01/21 0937  BP: (!) 106/57 110/66 105/66 118/67  Pulse: 98 93 86 (!) 102  Resp: (!) '22 17 19 16  '$ Temp: 98.1 F (36.7 C) 98.5  F (36.9 C) 98.3 F (36.8 C) 98 F (36.7 C)  TempSrc: Oral Oral Oral Oral  SpO2: 100% 100%  100%  Weight:      Height:        General exam: Appears calm and comfortable.     The results of significant diagnostics from this hospitalization (including imaging, microbiology, ancillary and laboratory) are listed below for  reference.     Procedures and Diagnostic Studies:   VAS [place] LOWER EXTREMITY VENOUS (DVT)  Result Date: 09/30/2021  Lower Venous DVT Study Patient Name:  Roberto Ponce  Date of Exam:   09/29/2021 Medical Rec #: [id number]            Accession #:    [id number] Date of Birth: 02/21/1950            Patient Gender: M Patient Age:   71 years Exam Location:  [facility] Procedure:      VAS [place] LOWER EXTREMITY VENOUS (DVT) Referring Phys: Wynetta Fines --------------------------------------------------------------------------------  Indications: Hx of DVT, and Swelling.  Comparison Study: 07/05/21, positive for DVT on the right in the popliteal, PTV,                   pero and gastroc veins. Resolved on this exam. Performing Technologist: Bobetta Lime BS, RVT  Examination Guidelines: A complete evaluation includes B-mode imaging, spectral Doppler, color Doppler, and power Doppler as needed of all accessible portions of each vessel. Bilateral testing is considered an integral part of a complete examination. Limited examinations for reoccurring indications may be performed as noted. The reflux portion of the exam is performed with the patient in reverse Trendelenburg.  +---------+---------------+---------+-----------+----------+--------------+ RIGHT    CompressibilityPhasicitySpontaneityPropertiesThrombus Aging +---------+---------------+---------+-----------+----------+--------------+ CFV      Full           Yes      Yes                                 +---------+---------------+---------+-----------+----------+--------------+ SFJ      Full                                                        +---------+---------------+---------+-----------+----------+--------------+ FV Prox  Full                                                        +---------+---------------+---------+-----------+----------+--------------+ FV Mid   Full                                                         +---------+---------------+---------+-----------+----------+--------------+ FV DistalFull                                                        +---------+---------------+---------+-----------+----------+--------------+ PFV  Full                                                        +---------+---------------+---------+-----------+----------+--------------+ POP      Full           Yes      Yes                                 +---------+---------------+---------+-----------+----------+--------------+ PTV      Full                    Yes                                 +---------+---------------+---------+-----------+----------+--------------+ PERO     Full                    Yes                                 +---------+---------------+---------+-----------+----------+--------------+   +---------+---------------+---------+-----------+----------+--------------+ LEFT     CompressibilityPhasicitySpontaneityPropertiesThrombus Aging +---------+---------------+---------+-----------+----------+--------------+ CFV      Full           Yes      Yes                                 +---------+---------------+---------+-----------+----------+--------------+ SFJ      Full                                                        +---------+---------------+---------+-----------+----------+--------------+ FV Prox  Full                                                        +---------+---------------+---------+-----------+----------+--------------+ FV Mid   Full                                                        +---------+---------------+---------+-----------+----------+--------------+ FV DistalFull                                                        +---------+---------------+---------+-----------+----------+--------------+ PFV      Full                                                         +---------+---------------+---------+-----------+----------+--------------+  POP      Full           Yes      Yes                                 +---------+---------------+---------+-----------+----------+--------------+ PTV      Full                                                        +---------+---------------+---------+-----------+----------+--------------+ PERO     Full                    Yes                                 +---------+---------------+---------+-----------+----------+--------------+     Summary: BILATERAL: - No evidence of deep vein thrombosis seen in the lower extremities, bilaterally. -No evidence of popliteal cyst, bilaterally. RIGHT: - Findings suggest resolution of previously noted thrombus.   *See table(s) above for measurements and observations. Electronically signed by Orlie Pollen on 09/30/2021 at 12:28:08 PM.    Final    DG Chest Portable 1 View  Result Date: 09/28/2021 CLINICAL DATA:  Tachypnea, shortness of breath EXAM: PORTABLE CHEST 1 VIEW COMPARISON:  09/01/2021 FINDINGS: Diffuse airspace disease throughout the left lung compatible with fibrotic changes seen on prior CT and plain films, unchanged. No confluent opacity on the right. Prior median sternotomy and CABG. Heart is normal size. Aortic atherosclerosis. No acute bony abnormality. IMPRESSION: Stable chronic fibrotic changes most pronounced in the left lung. No acute cardiopulmonary disease. Electronically Signed   By: Rolm Baptise M.D.   On: 09/28/2021 17:29     Labs:   Basic Metabolic Panel: Recent Labs  Lab 09/28/21 1420 09/30/21 0702  NA 131* 131*  K 4.8 3.7  CL 93* 98  CO2 30 27  GLUCOSE 105* 85  BUN 15 9  CREATININE 0.60* 0.67  CALCIUM 8.6* 8.3*   GFR Estimated Creatinine Clearance: 76.8 mL/min (by C-G formula based on SCr of 0.67 mg/dL). Liver Function Tests: Recent Labs  Lab 09/28/21 1420  AST 17  ALT 21  ALKPHOS 55  BILITOT 0.5  PROT 6.2*  ALBUMIN 3.1*   No  results for input(s): "LIPASE", "AMYLASE" in the last 168 hours. No results for input(s): "AMMONIA" in the last 168 hours. Coagulation profile No results for input(s): "INR", "PROTIME" in the last 168 hours.  CBC: Recent Labs  Lab 09/28/21 1420 09/29/21 0123 09/30/21 0100 10/01/21 1028  WBC 12.0* 8.0 11.5* 8.5  HGB 6.5* 7.7* 9.1* 7.8*  HCT 23.2* 26.1* 31.4* 27.1*  MCV 73.7* 75.9* 76.8* 78.1*  PLT 468* 357 441* 386   Cardiac Enzymes: No results for input(s): "CKTOTAL", "CKMB", "CKMBINDEX", "TROPONINI" in the last 168 hours. BNP: Invalid input(s): "POCBNP" CBG: No results for input(s): "GLUCAP" in the last 168 hours. D-Dimer No results for input(s): "DDIMER" in the last 72 hours. Hgb A1c No results for input(s): "HGBA1C" in the last 72 hours. Lipid Profile No results for input(s): "CHOL", "HDL", "LDLCALC", "TRIG", "CHOLHDL", "LDLDIRECT" in the last 72 hours. Thyroid function studies No results for input(s): "TSH", "T4TOTAL", "T3FREE", "THYROIDAB" in the last 72 hours.  Invalid input(s): "FREET3"  Anemia work up Recent Labs    09/28/21 1420  VITAMINB12 180  FOLATE 15.1  FERRITIN 6*  TIBC 295  IRON 13*  RETICCTPCT 3.5*   Microbiology No results found for this or any previous visit (from the past 240 hour(s)).   Discharge Instructions:   Discharge Instructions     Diet - low sodium heart healthy   Complete by: As directed    Increase activity slowly   Complete by: As directed       Allergies as of 10/01/2021   No Known Allergies      Medication List     STOP taking these medications    apixaban 5 MG Tabs tablet Commonly known as: ELIQUIS   sulfamethoxazole-trimethoprim 800-160 MG tablet Commonly known as: BACTRIM DS       TAKE these medications    acetaminophen 500 MG tablet Commonly known as: TYLENOL Take 1,000 mg by mouth every 6 (six) hours as needed for moderate pain.   aspirin EC 81 MG tablet Take 1 tablet (81 mg total) by mouth  daily. Swallow whole.   brimonidine 0.2 % ophthalmic solution Commonly known as: ALPHAGAN Place 1 drop into the left eye 2 (two) times daily.   carvedilol 3.125 MG tablet Commonly known as: COREG Take 1 tablet (3.125 mg total) by mouth 2 (two) times daily with a meal.   cyanocobalamin 1000 MCG tablet Take 1 tablet (1,000 mcg total) by mouth daily. Start taking on: October 02, 2021   dapagliflozin propanediol 10 MG Tabs tablet Commonly known as: FARXIGA Take 1 tablet (10 mg total) by mouth daily.   dorzolamide-timolol 22.3-6.8 MG/ML ophthalmic solution Commonly known as: COSOPT Place 1 drop into both eyes 2 (two) times daily.   ezetimibe 10 MG tablet Commonly known as: ZETIA TAKE 1 TABLET(10 MG) BY MOUTH DAILY What changed:  how much to take how to take this when to take this additional instructions   feeding supplement Liqd Take 237 mLs by mouth 2 (two) times daily between meals.   furosemide 40 MG tablet Commonly known as: LASIX Take 1 tablet by mouth on Mondays, Wednesdays, and Fridays only What changed:  how much to take how to take this when to take this   latanoprost 0.005 % ophthalmic solution Commonly known as: XALATAN Place 1 drop into both eyes at bedtime.   magic mouthwash (nystatin, lidocaine, diphenhydrAMINE) suspension Take 5 mLs by mouth 3 (three) times daily as needed for mouth pain.   nitroGLYCERIN 0.4 MG SL tablet Commonly known as: Nitrostat Place 1 tablet (0.4 mg total) under the tongue every 5 (five) minutes as needed for chest pain.   pantoprazole 40 MG tablet Commonly known as: Protonix 40 mg BID x 30 days then once daily after   predniSONE 10 MG tablet Commonly known as: DELTASONE Take 10 mg by mouth See admin instructions. Take 20 mg tablet daily (started on 7/15-7/29), Take 15 mg by mouth daily (7/30-8/13), Take 10 mg daily(8/14-8/28), take 5 mg daily(8/29-9/5)   Sodium Fluoride 5000 PPM 1.1 % Pste Generic drug: Sodium  Fluoride Apply 1 application. topically in the morning and at bedtime.   spironolactone 25 MG tablet Commonly known as: ALDACTONE Take 0.5 tablets (12.5 mg total) by mouth daily.   triamcinolone cream 0.1 % Commonly known as: KENALOG Apply 1 application topically 2 (two) times daily as needed (eczema).        Follow-up Information     Kristen Loader, FNP Follow up.   Specialty:  Family Medicine Why: keep appointment for Wednesday Contact information: [place] [address] [phone]         Werner Lean, MD .   Specialty: Cardiology Contact information: [address] [place] [address] [phone]                  Time coordinating discharge: 45 min  Signed:  Geradine Girt DO  [facility] 10/01/2021, 11:37 AM

## 2021-10-01 NOTE — TOC Transition Note (Signed)
Transition of Care [facility]) - CM/SW Discharge Note   Patient Details  Name: Roberto Ponce MRN: [id number] Date of Birth: [birth year]  Transition of [facility]) CM/SW Contact:  Bartholomew Crews, RN Phone Number: [phone] 10/01/2021, 1:09 PM   Clinical Narrative:     Notified by PT of patient's need for [facility] PT. Previously active with [facility] - ?current. HH PT orders in place. Message sent to liaison at [place]. No further TOC needs identified at this time.   Final next level of care: Home w Home Health Services Barriers to Discharge: No Barriers Identified   Patient Goals and CMS Choice        Discharge Placement                       Discharge Plan and Services                DME Arranged: N/A DME Agency: NA       HH Arranged: PT HH Agency: [place] Date [place]: 10/01/21 Time [place]: 52 Representative spoke with at [place]: Amy  Social Determinants of Health ([place]) Interventions     Readmission Risk Interventions     No data to display

## 2021-10-01 NOTE — Evaluation (Signed)
Physical Therapy Evaluation Patient Details Name: Roberto Ponce MRN: [id number] DOB: 1951-01-22 Today's Date: 10/01/2021  History of Present Illness  Roberto Ponce is a 71 y.o. 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 GI consulted- s/p EGD/colonoscopy.  Clinical Impression  Pt agreeable to physical therapy evaluation/treatment session. Pt performing bed mobility independently while CGA utilized intermittently for transfers and gait only for safety. Pt's oxygen saturations stable with ambulation. Pt currently presents with functional limitations secondary to impairments listed in PT problem list. Pt to benefit from skilled, acute care physical therapy interventions to maximize his independence level and quality of life.       Recommendations for follow up therapy are one component of a multi-disciplinary discharge planning process, led by the attending physician.  Recommendations may be updated based on patient status, additional functional criteria and insurance authorization.  Follow Up Recommendations Home health PT      Assistance Recommended at Discharge PRN  Patient can return home with the following  A little help with walking and/or transfers;A little help with bathing/dressing/bathroom;Assistance with cooking/housework;Assist for transportation    Equipment Recommendations None recommended by PT  Recommendations for Other Services       Functional Status Assessment Patient has had a recent decline in their functional status and demonstrates the ability to make  significant improvements in function in a reasonable and predictable amount of time.     Precautions / Restrictions Precautions Precautions: Fall Restrictions Weight Bearing Restrictions: No      Mobility  Bed Mobility Overal bed mobility: Independent                  Transfers Overall transfer level: Needs assistance Equipment used: Rolling walker (2 wheels) Transfers: Sit to/from Stand Sit to Stand: Min guard           General transfer comment: Min guard for safety and due to pt initial retropulsion once in static standing (able to correct with cues). Otherwise, modified independent level.    Ambulation/Gait Ambulation/Gait assistance: Min guard, Supervision Gait Distance (Feet): 60 Feet Assistive device: Rolling walker (2 wheels) Gait Pattern/deviations: Step-through pattern, Trunk flexed Gait velocity: dec Gait velocity interpretation: 1.31 - 2.62 ft/sec, indicative of limited community ambulator Pre-gait activities: marching in place, FTEO w/o UE support, and heel/toe raises General Gait Details: Pt ambulated short distance around bed to chair x 10 feet followed by rest break. Pt then ambulated 50 feet within room (few laps). Pt required cues for pacing. No LOB occurred. SpO2 stable on 3L O2.  Stairs            Wheelchair Mobility    Modified Rankin (Stroke Patients Only)       Balance Overall balance assessment: Needs assistance   Sitting balance-Leahy Scale: Good     Standing balance support: Bilateral upper extremity supported, No upper extremity supported, Reliant on assistive device for balance, Single extremity supported Standing balance-Leahy Scale: Fair Standing balance comment: Pt able to maintain static standing balance without UE support and standby to CGA. Pt with minimal to mdoerate unsteadiness with FTEO and no UE support.  Pertinent Vitals/Pain Pain Assessment Pain Assessment: No/denies  pain    Home Living Family/patient expects to be discharged to:: Private residence Living Arrangements: Spouse/significant other Available Help at Discharge: Family;Available 24 hours/day Type of Home: House Home Access: Level entry       Home Layout: One level Home Equipment: Advice worker (2 wheels);Cane - single point;Wheelchair - manual Additional Comments: Uses 2L O2 at home    Prior Function Prior Level of Function : Independent/Modified Independent (does not drive mainly due to glaucoma)             Mobility Comments: Pt uses RW as needed. ADLs Comments: Pt independent with self-care but does require assistance to get in and out of tub. Pt's spouse takes care of errands but otherwise always with pt.     Hand Dominance   Dominant Hand: Right    Extremity/Trunk Assessment   Upper Extremity Assessment Upper Extremity Assessment: Overall WFL for tasks assessed    Lower Extremity Assessment Lower Extremity Assessment: Generalized weakness (At least 3+/5 B LE's)       Communication   Communication: No difficulties  Cognition Arousal/Alertness: Awake/alert Behavior During Therapy: WFL for tasks assessed/performed Overall Cognitive Status: Within Functional Limits for tasks assessed                                          General Comments General comments (skin integrity, edema, etc.): 95% SpO2 and 103 HR on 3L O2    Exercises     Assessment/Plan    PT Assessment Patient needs continued PT services  PT Problem List Decreased strength;Decreased balance;Decreased mobility;Decreased knowledge of use of DME;Cardiopulmonary status limiting activity       PT Treatment Interventions DME instruction;Gait training    PT Goals (Current goals can be found in the Care Plan section)  Acute Rehab PT Goals Patient Stated Goal: none stated PT Goal Formulation: With patient Time For Goal Achievement: 10/06/21 Potential to Achieve  Goals: Good    Frequency Min 2X/week     Co-evaluation               AM-PAC PT "6 Clicks" Mobility  Outcome Measure Help needed turning from your back to your side while in a flat bed without using bedrails?: None Help needed moving from lying on your back to sitting on the side of a flat bed without using bedrails?: None Help needed moving to and from a bed to a chair (including a wheelchair)?: A Little Help needed standing up from a chair using your arms (e.g., wheelchair or bedside chair)?: A Little Help needed to walk in hospital room?: A Little Help needed climbing 3-5 steps with a railing? : A Little 6 Click Score: 20    End of Session Equipment Utilized During Treatment: Gait belt;Oxygen Activity Tolerance: Patient tolerated treatment well;Patient limited by fatigue Patient left: in chair;with call bell/phone within reach;with family/visitor present   PT Visit Diagnosis: Other abnormalities of gait and mobility (R26.89);Muscle weakness (generalized) (M62.81)    Time: 7322-0254 PT Time Calculation (min) (ACUTE ONLY): 30 min   Charges:   PT Evaluation $PT Eval Low Complexity: 1 Low PT Treatments $Gait Training: 8-22 mins       Donna Bernard, PT   [facility] 10/01/2021, 11:31 AM

## 2021-10-04 DIAGNOSIS — E782 Mixed hyperlipidemia: Secondary | ICD-10-CM | POA: Diagnosis not present

## 2021-10-04 DIAGNOSIS — N400 Enlarged prostate without lower urinary tract symptoms: Secondary | ICD-10-CM | POA: Diagnosis not present

## 2021-10-04 DIAGNOSIS — E871 Hypo-osmolality and hyponatremia: Secondary | ICD-10-CM | POA: Diagnosis not present

## 2021-10-04 DIAGNOSIS — Z0000 Encounter for general adult medical examination without abnormal findings: Secondary | ICD-10-CM | POA: Diagnosis not present

## 2021-10-04 DIAGNOSIS — E46 Unspecified protein-calorie malnutrition: Secondary | ICD-10-CM | POA: Diagnosis not present

## 2021-10-04 DIAGNOSIS — Z09 Encounter for follow-up examination after completed treatment for conditions other than malignant neoplasm: Secondary | ICD-10-CM | POA: Diagnosis not present

## 2021-10-04 DIAGNOSIS — I2510 Atherosclerotic heart disease of native coronary artery without angina pectoris: Secondary | ICD-10-CM | POA: Diagnosis not present

## 2021-10-04 DIAGNOSIS — D649 Anemia, unspecified: Secondary | ICD-10-CM | POA: Diagnosis not present

## 2021-10-04 DIAGNOSIS — K922 Gastrointestinal hemorrhage, unspecified: Secondary | ICD-10-CM | POA: Diagnosis not present

## 2021-10-06 ENCOUNTER — Other Ambulatory Visit: Payer: Self-pay

## 2021-10-06 MED ORDER — CARVEDILOL 3.125 MG PO TABS: 3.1250 mg | ORAL_TABLET | Freq: Two times a day (BID) | ORAL | 11 refills | Status: DC

## 2021-10-06 MED ORDER — DAPAGLIFLOZIN PROPANEDIOL 10 MG PO TABS: 10.0000 mg | ORAL_TABLET | Freq: Every day | ORAL | 11 refills | Status: DC

## 2021-10-06 NOTE — Telephone Encounter (Signed)
Pt's medication was sent to pt's pharmacy as requested. Confirmation received.  °

## 2021-10-13 DIAGNOSIS — J9621 Acute and chronic respiratory failure with hypoxia: Secondary | ICD-10-CM | POA: Diagnosis not present

## 2021-10-13 DIAGNOSIS — I5022 Chronic systolic (congestive) heart failure: Secondary | ICD-10-CM | POA: Diagnosis not present

## 2021-10-17 ENCOUNTER — Encounter: Payer: Self-pay | Admitting: Pulmonary Disease

## 2021-10-17 ENCOUNTER — Ambulatory Visit (INDEPENDENT_AMBULATORY_CARE_PROVIDER_SITE_OTHER): Payer: PPO | Admitting: Pulmonary Disease

## 2021-10-17 VITALS — BP 108/64 | HR 91 | Temp 98.2°F | Ht 69.0 in | Wt 128.0 lb

## 2021-10-17 DIAGNOSIS — J8489 Other specified interstitial pulmonary diseases: Secondary | ICD-10-CM

## 2021-10-17 NOTE — Patient Instructions (Addendum)
Prednisone Taper: '5mg'$  daily 8/29 to 9/5 Then Stop  Recommend keeping a blood pressure log throughout each day and monitor whether the lasix is dropping your blood pressure too much  We will check a CT Chest scan to evaluate your lungs  Follow up in 2 months

## 2021-10-17 NOTE — Progress Notes (Signed)
Synopsis: Referred in June 2023 for hospital follow up  Subjective:   PATIENT ID: Roberto Ponce GENDER: male DOB: June 09, 1950, MRN: [id number]  HPI  Chief Complaint  Patient presents with   Follow-up    Breathing is overall doing well today. He states he has noticed that his breathing seems worse on days that he takes lasix. Recent ED visit for low hemoglobin 09/28/21.    Roberto Ponce is a 71 year old male, never smoker with coronary artery disease, moderate-severe mitral regurgitation and testicular cancer s/p orchiectomy who returns to pulmonary clinic for acute hypoxemic respiratory failure due to viral pneumonia with post-viral BOOP and pneumomediastinum.   He was admitted 8/10 to 8/13 for symptomatic anemia due to GI bleed. He was taken off eliquis. EGD showed LA grade B reflux esophagitis. Colonoscopy did not indicate source of bleeding. He reports feeling better since discharge. He is becoming a bit more active.   He has tapered down to '5mg'$  of prednisone per day.  He denies fevers, chills or night sweats. Chest radiograph 8/10 showed stable chronic fibrotic changes mainly of the left lung.   OV 09/01/21 He reports he is feeling better since last visit with the increase in steroids to '30mg'$  daily. He remains on bactrim prophlaxis.   He is using 2L of oxygen. He is ambulating more. His appetite is better and he is maintaining his weight.   He complains of sores on his tongue and mouth.   Past Medical History:  Diagnosis Date   Asthma    BOOP (bronchiolitis obliterans with organizing pneumonia) ([place])    Chronic respiratory failure ([place])    Coronary artery disease    DVT (deep venous thrombosis) ([facility])    Glaucoma    Heart failure with mid-range ejection fraction ([facility])    History of gallstones    Hyperlipidemia    Hyponatremia    Hypotension due to medication 06/22/2021   LVH (left ventricular hypertrophy)    MI (myocardial infarction) ([employer])    Mitral  regurgitation    Mixed dyslipidemia    Pneumomediastinum ([place])    Protein calorie malnutrition ([place])    Shock ([place]) 06/22/2021   Venous stasis      Family History  Problem Relation Age of Onset   Heart attack Father    Stroke Father      Social History   Socioeconomic History   Marital status: Married    Spouse name: Not on file   Number of children: Not on file   Years of education: Not on file   Highest education level: Not on file  Occupational History   Not on file  Tobacco Use   Smoking status: Never   Smokeless tobacco: Never  Vaping Use   Vaping Use: Never used  Substance and Sexual Activity   Alcohol use: No   Drug use: No   Sexual activity: Not on file  Other Topics Concern   Not on file  Social History Narrative   Not on file   Social Determinants of Health   Financial Resource Strain: Not on file  Food Insecurity: Not on file  Transportation Needs: Not on file  Physical Activity: Not on file  Stress: Not on file  Social Connections: Not on file  Intimate Partner Violence: Not on file     No Known Allergies   Outpatient Medications Prior to Visit  Medication Sig Dispense Refill   acetaminophen (TYLENOL) 500 MG tablet Take 1,000 mg by mouth every 6 (  six) hours as needed for moderate pain.     aspirin EC 81 MG tablet Take 1 tablet (81 mg total) by mouth daily. Swallow whole. 90 tablet 3   brimonidine (ALPHAGAN) 0.2 % ophthalmic solution Place 1 drop into the left eye 2 (two) times daily.     carvedilol (COREG) 3.125 MG tablet Take 1 tablet (3.125 mg total) by mouth 2 (two) times daily with a meal. 60 tablet 11   cyanocobalamin 1000 MCG tablet Take 1 tablet (1,000 mcg total) by mouth daily.     dapagliflozin propanediol (FARXIGA) 10 MG TABS tablet Take 1 tablet (10 mg total) by mouth daily. 30 tablet 11   dorzolamide-timolol (COSOPT) 22.3-6.8 MG/ML ophthalmic solution Place 1 drop into both eyes 2 (two) times daily.     ezetimibe (ZETIA) 10 MG tablet  TAKE 1 TABLET(10 MG) BY MOUTH DAILY (Patient taking differently: Take 10 mg by mouth daily.) 90 tablet 1   feeding supplement (ENSURE ENLIVE / ENSURE PLUS) LIQD Take 237 mLs by mouth 2 (two) times daily between meals. 237 mL 12   furosemide (LASIX) 40 MG tablet Take 1 tablet by mouth on Mondays, Wednesdays, and Fridays only (Patient taking differently: Take 40 mg by mouth See admin instructions. Take 1 tablet by mouth on Mondays, Wednesdays, and Fridays only) 40 tablet 3   latanoprost (XALATAN) 0.005 % ophthalmic solution Place 1 drop into both eyes at bedtime.      magic mouthwash (nystatin, lidocaine, diphenhydrAMINE) suspension Take 5 mLs by mouth 3 (three) times daily as needed for mouth pain. 180 mL 1   nitroGLYCERIN (NITROSTAT) 0.4 MG SL tablet Place 1 tablet (0.4 mg total) under the tongue every 5 (five) minutes as needed for chest pain. 25 tablet 3   pantoprazole (PROTONIX) 40 MG tablet 40 mg BID x 30 days then once daily after 60 tablet 1   predniSONE (DELTASONE) 10 MG tablet Take 10 mg by mouth See admin instructions. Take 20 mg tablet daily (started on 7/15-7/29), Take 15 mg by mouth daily (7/30-8/13), Take 10 mg daily(8/14-8/28), take 5 mg daily(8/29-9/5)     SODIUM FLUORIDE 5000 PPM 1.1 % PSTE Apply 1 application. topically in the morning and at bedtime.     spironolactone (ALDACTONE) 25 MG tablet Take 0.5 tablets (12.5 mg total) by mouth daily. 30 tablet 2   triamcinolone cream (KENALOG) 0.1 % Apply 1 application topically 2 (two) times daily as needed (eczema).     No facility-administered medications prior to visit.   Review of Systems  Constitutional:  Positive for malaise/fatigue and weight loss. Negative for chills and fever.  HENT:  Negative for congestion, sinus pain and sore throat.   Eyes: Negative.   Respiratory:  Positive for shortness of breath. Negative for cough, hemoptysis, sputum production and wheezing.   Cardiovascular:  Negative for chest pain, palpitations,  orthopnea, claudication and leg swelling.  Gastrointestinal:  Negative for abdominal pain, heartburn, nausea and vomiting.  Genitourinary: Negative.   Musculoskeletal:  Negative for joint pain and myalgias.  Skin:  Negative for rash.  Neurological:  Negative for weakness.  Endo/Heme/Allergies: Negative.   Psychiatric/Behavioral: Negative.      Objective:   Vitals:   10/17/21 1342  BP: 108/64  Pulse: 91  Temp: 98.2 F (36.8 C)  TempSrc: Oral  SpO2: 95%  Weight: 128 lb (58.1 kg)  Height: '5\' 9"'$  (1.753 m)   Physical Exam Constitutional:      General: He is not in acute distress.  Appearance: He is cachectic. He is ill-appearing (chronically).  HENT:     Head: Normocephalic and atraumatic.  Eyes:     Extraocular Movements: Extraocular movements intact.     Conjunctiva/sclera: Conjunctivae normal.     Pupils: Pupils are equal, round, and reactive to light.  Cardiovascular:     Rate and Rhythm: Normal rate and regular rhythm.     Pulses: Normal pulses.     Heart sounds: Normal heart sounds. No murmur heard. Pulmonary:     Breath sounds: Decreased air movement present. Decreased breath sounds and rales present.  Abdominal:     General: Bowel sounds are normal.     Palpations: Abdomen is soft.  Musculoskeletal:     Right lower leg: No edema.     Left lower leg: No edema.  Skin:    General: Skin is warm and dry.  Neurological:     General: No focal deficit present.  Psychiatric:        Mood and Affect: Mood normal.        Behavior: Behavior normal.        Thought Content: Thought content normal.        Judgment: Judgment normal.    CBC    Component Value Date/Time   WBC 8.5 10/01/2021 1028   RBC 3.47 (L) 10/01/2021 1028   HGB 7.8 (L) 10/01/2021 1028   HGB 14.0 01/26/2021 0859   HCT 27.1 (L) 10/01/2021 1028   HCT 41.2 01/26/2021 0859   PLT 386 10/01/2021 1028   PLT 186 01/26/2021 0859   MCV 78.1 (L) 10/01/2021 1028   MCV 89 01/26/2021 0859   MCH 22.5 (L)  10/01/2021 1028   MCHC 28.8 (L) 10/01/2021 1028   RDW 21.3 (H) 10/01/2021 1028   RDW 14.1 01/26/2021 0859   LYMPHSABS 1.4 07/04/2021 2210   MONOABS 0.6 07/04/2021 2210   EOSABS 0.5 07/04/2021 2210   BASOSABS 0.0 07/04/2021 2210      Latest Ref Rng & Units 10/01/2021   10:28 AM 09/30/2021    7:02 AM 09/28/2021    2:20 PM  BMP  Glucose 70 - 99 mg/dL 113  85  105   BUN 8 - 23 mg/dL '7  9  15   '$ Creatinine 0.61 - 1.24 mg/dL 0.63  0.67  0.60   Sodium 135 - 145 mmol/L 134  131  131   Potassium 3.5 - 5.1 mmol/L 3.9  3.7  4.8   Chloride 98 - 111 mmol/L 98  98  93   CO2 22 - 32 mmol/L '31  27  30   '$ Calcium 8.9 - 10.3 mg/dL 8.3  8.3  8.6    Chest imaging: CXR 09/28/21 Diffuse airspace disease throughout the left lung compatible with fibrotic changes seen on prior CT and plain films, unchanged. No confluent opacity on the right. Prior median sternotomy and CABG. Heart is normal size. Aortic atherosclerosis. No acute bony abnormality.  CXR 09/01/21 1. Persistent small left apical and medial pneumothorax. 2. Interval resolution of pneumomediastinum. 3. Advanced left lung fibrotic changes and chronic scarring. 4. No new acute cardiopulmonary process.  CXR 07/25/21 1. Increasing left basilar airspace disease. 2. Stable small left apical pneumothorax in left pneumomediastinum. 3. Stable chronic interstitial changes.  CXR 07/11/21 Stable cardiomediastinal contours. Pneumomediastinum is again noted bilaterally. Gas is noted tracking into the lower neck as before. Unchanged asymmetric opacification throughout the left lung. Mild peripheral opacities within the right lower lung is also unchanged. No signs of pneumothorax.  Trace left pleural fluid. Unchanged.  CTA Chest 07/10/21 1. Negative for pulmonary embolism to the segmental level. 2. Extensive pneumomediastinum tracking into the low neck and involving the LEFT greater than RIGHT mediastinal border and extending along the esophagus and  underneath the crus of the LEFT hemidiaphragm but not extending into the abdomen. 3. Etiology for above process is uncertain. Perhaps cough leading to barotrauma. Esophageal source is another differential consideration. No mediastinal fluid or focal thickening of the esophagus. This does not allow for exclusion of esophageal source but is perhaps reassuring 4. Small associated and partially loculated LEFT-sided pneumothorax at the apex, anteriorly and with a small loculated component in the major fissure. 5. Small LEFT-sided pleural effusion slightly diminished with resolution of RIGHT-sided effusion. 6. Improving airspace disease still with considerable ground-glass and septal thickening in the LEFT chest and worse at the LEFT lung base. 7. Aortic atherosclerosis, coronary artery disease and changes of CABG.  PFT:     No data to display          Labs:  Path:  Echo 07/05/21: EF 40-45%. RV systolic function and size is normal. Mildly elevated PASP.  Heart Catheterization:  Assessment & Plan:   Bronchiolitis obliterans with organizing pneumonia ([place]) - Plan: CT CHEST WO CONTRAST  Discussion: Roberto Ponce is a 71 year old male, never smoker with coronary artery disease, moderate-severe mitral regurgitation and testicular cancer s/p orchiectomy who is referred to pulmonary clinic for hospital follow up for acute hypoxemic respiratory failure due to viral pneumonia with post-viral BOOP and pneumomediastinum.   He will be completing his steroid taper on 9/5 as he has transitioned to '5mg'$  daily today.   He is overall feeling better. He has pursed lip breathing on exam today. Chest radiograph 8/10 remains stable appearing. We will obtain a CT Chest for further evaluation of his lung inflammation/fibrosis.   I encouraged him to continue to work on achieving a certain caloric intake per day to maintain adequate nutrition.   He will be starting an iron supplement for his iron  deficiency anemia.   We can consider bronchoscopy in the future for updated cultures and possible transbronchial biopsies depending on how he does off steroids.   Follow up in 2 months.   Freda Jackson, MD [facility] Office: [phone]   Current Outpatient Medications:    acetaminophen (TYLENOL) 500 MG tablet, Take 1,000 mg by mouth every 6 (six) hours as needed for moderate pain., Disp: , Rfl:    aspirin EC 81 MG tablet, Take 1 tablet (81 mg total) by mouth daily. Swallow whole., Disp: 90 tablet, Rfl: 3   brimonidine (ALPHAGAN) 0.2 % ophthalmic solution, Place 1 drop into the left eye 2 (two) times daily., Disp: , Rfl:    carvedilol (COREG) 3.125 MG tablet, Take 1 tablet (3.125 mg total) by mouth 2 (two) times daily with a meal., Disp: 60 tablet, Rfl: 11   cyanocobalamin 1000 MCG tablet, Take 1 tablet (1,000 mcg total) by mouth daily., Disp: , Rfl:    dapagliflozin propanediol (FARXIGA) 10 MG TABS tablet, Take 1 tablet (10 mg total) by mouth daily., Disp: 30 tablet, Rfl: 11   dorzolamide-timolol (COSOPT) 22.3-6.8 MG/ML ophthalmic solution, Place 1 drop into both eyes 2 (two) times daily., Disp: , Rfl:    ezetimibe (ZETIA) 10 MG tablet, TAKE 1 TABLET(10 MG) BY MOUTH DAILY (Patient taking differently: Take 10 mg by mouth daily.), Disp: 90 tablet, Rfl: 1   feeding supplement (ENSURE ENLIVE /  ENSURE PLUS) LIQD, Take 237 mLs by mouth 2 (two) times daily between meals., Disp: 237 mL, Rfl: 12   furosemide (LASIX) 40 MG tablet, Take 1 tablet by mouth on Mondays, Wednesdays, and Fridays only (Patient taking differently: Take 40 mg by mouth See admin instructions. Take 1 tablet by mouth on Mondays, Wednesdays, and Fridays only), Disp: 40 tablet, Rfl: 3   latanoprost (XALATAN) 0.005 % ophthalmic solution, Place 1 drop into both eyes at bedtime. , Disp: , Rfl:    magic mouthwash (nystatin, lidocaine, diphenhydrAMINE) suspension, Take 5 mLs by mouth 3 (three) times daily as  needed for mouth pain., Disp: 180 mL, Rfl: 1   nitroGLYCERIN (NITROSTAT) 0.4 MG SL tablet, Place 1 tablet (0.4 mg total) under the tongue every 5 (five) minutes as needed for chest pain., Disp: 25 tablet, Rfl: 3   pantoprazole (PROTONIX) 40 MG tablet, 40 mg BID x 30 days then once daily after, Disp: 60 tablet, Rfl: 1   predniSONE (DELTASONE) 10 MG tablet, Take 10 mg by mouth See admin instructions. Take 20 mg tablet daily (started on 7/15-7/29), Take 15 mg by mouth daily (7/30-8/13), Take 10 mg daily(8/14-8/28), take 5 mg daily(8/29-9/5), Disp: , Rfl:    SODIUM FLUORIDE 5000 PPM 1.1 % PSTE, Apply 1 application. topically in the morning and at bedtime., Disp: , Rfl:    spironolactone (ALDACTONE) 25 MG tablet, Take 0.5 tablets (12.5 mg total) by mouth daily., Disp: 30 tablet, Rfl: 2   triamcinolone cream (KENALOG) 0.1 %, Apply 1 application topically 2 (two) times daily as needed (eczema)., Disp: , Rfl:

## 2021-10-18 ENCOUNTER — Telehealth: Payer: Self-pay | Admitting: Pulmonary Disease

## 2021-10-18 NOTE — Telephone Encounter (Signed)
Patient called to ask the nurse to call him regarding a Ct he has scheduled  on 9/7 and he also has a Neurology test w/contrast scheduled for 9/8 as well.  Patient would like to know if both tests are needed or should one be scheduled out further.  Please advise and call patient to discuss at [phone]

## 2021-10-19 ENCOUNTER — Ambulatory Visit: Payer: PPO | Admitting: Emergency Medicine

## 2021-10-19 DIAGNOSIS — C61 Malignant neoplasm of prostate: Secondary | ICD-10-CM | POA: Diagnosis not present

## 2021-10-19 DIAGNOSIS — C6211 Malignant neoplasm of descended right testis: Secondary | ICD-10-CM | POA: Diagnosis not present

## 2021-10-19 NOTE — Telephone Encounter (Signed)
Called and spoke with patient.  Patient is having a follow up scan with [facility] 10/20/21.  Patient wanted to know if CT chest would be affected if he has contrast 10/20/21.  Advised there should not  be any issue with contrast, because contrast  should be out of his system before 10/26/21. Advised he could let [facility] know of upcoming CT chest regarding any scans,test they are planning and possible effects.

## 2021-10-20 DIAGNOSIS — R590 Localized enlarged lymph nodes: Secondary | ICD-10-CM | POA: Diagnosis not present

## 2021-10-20 DIAGNOSIS — K5730 Diverticulosis of large intestine without perforation or abscess without bleeding: Secondary | ICD-10-CM | POA: Diagnosis not present

## 2021-10-20 DIAGNOSIS — J939 Pneumothorax, unspecified: Secondary | ICD-10-CM | POA: Diagnosis not present

## 2021-10-20 DIAGNOSIS — K449 Diaphragmatic hernia without obstruction or gangrene: Secondary | ICD-10-CM | POA: Diagnosis not present

## 2021-10-20 DIAGNOSIS — J90 Pleural effusion, not elsewhere classified: Secondary | ICD-10-CM | POA: Diagnosis not present

## 2021-10-20 DIAGNOSIS — J479 Bronchiectasis, uncomplicated: Secondary | ICD-10-CM | POA: Diagnosis not present

## 2021-10-20 DIAGNOSIS — I714 Abdominal aortic aneurysm, without rupture, unspecified: Secondary | ICD-10-CM | POA: Diagnosis not present

## 2021-10-20 DIAGNOSIS — C6211 Malignant neoplasm of descended right testis: Secondary | ICD-10-CM | POA: Diagnosis not present

## 2021-10-26 ENCOUNTER — Ambulatory Visit (HOSPITAL_COMMUNITY)
Admission: RE | Admit: 2021-10-26 | Discharge: 2021-10-26 | Disposition: A | Discharge status: Home / Self Care | Payer: PPO | Source: Ambulatory Visit | Attending: Pulmonary Disease | Admitting: Pulmonary Disease

## 2021-10-26 ENCOUNTER — Encounter (HOSPITAL_COMMUNITY): Payer: Self-pay

## 2021-10-26 DIAGNOSIS — J8489 Other specified interstitial pulmonary diseases: Secondary | ICD-10-CM | POA: Diagnosis not present

## 2021-10-26 DIAGNOSIS — J849 Interstitial pulmonary disease, unspecified: Secondary | ICD-10-CM | POA: Diagnosis not present

## 2021-10-26 DIAGNOSIS — C6211 Malignant neoplasm of descended right testis: Secondary | ICD-10-CM | POA: Diagnosis not present

## 2021-10-26 DIAGNOSIS — J939 Pneumothorax, unspecified: Secondary | ICD-10-CM | POA: Diagnosis not present

## 2021-10-26 DIAGNOSIS — I700 Atherosclerosis of aorta: Secondary | ICD-10-CM | POA: Diagnosis not present

## 2021-10-26 DIAGNOSIS — D414 Neoplasm of uncertain behavior of bladder: Secondary | ICD-10-CM | POA: Diagnosis not present

## 2021-10-26 DIAGNOSIS — J90 Pleural effusion, not elsewhere classified: Secondary | ICD-10-CM | POA: Diagnosis not present

## 2021-10-27 DIAGNOSIS — I2510 Atherosclerotic heart disease of native coronary artery without angina pectoris: Secondary | ICD-10-CM | POA: Diagnosis not present

## 2021-10-27 DIAGNOSIS — I5022 Chronic systolic (congestive) heart failure: Secondary | ICD-10-CM | POA: Diagnosis not present

## 2021-10-27 DIAGNOSIS — Z9981 Dependence on supplemental oxygen: Secondary | ICD-10-CM | POA: Diagnosis not present

## 2021-10-27 DIAGNOSIS — J45909 Unspecified asthma, uncomplicated: Secondary | ICD-10-CM | POA: Diagnosis not present

## 2021-10-27 DIAGNOSIS — I350 Nonrheumatic aortic (valve) stenosis: Secondary | ICD-10-CM | POA: Diagnosis not present

## 2021-10-27 DIAGNOSIS — J982 Interstitial emphysema: Secondary | ICD-10-CM | POA: Diagnosis not present

## 2021-10-27 DIAGNOSIS — I252 Old myocardial infarction: Secondary | ICD-10-CM | POA: Diagnosis not present

## 2021-10-27 DIAGNOSIS — J9621 Acute and chronic respiratory failure with hypoxia: Secondary | ICD-10-CM | POA: Diagnosis not present

## 2021-10-27 DIAGNOSIS — I25810 Atherosclerosis of coronary artery bypass graft(s) without angina pectoris: Secondary | ICD-10-CM | POA: Diagnosis not present

## 2021-10-27 DIAGNOSIS — I340 Nonrheumatic mitral (valve) insufficiency: Secondary | ICD-10-CM | POA: Diagnosis not present

## 2021-11-01 ENCOUNTER — Telehealth: Payer: Self-pay

## 2021-11-01 DIAGNOSIS — I5021 Acute systolic (congestive) heart failure: Secondary | ICD-10-CM

## 2021-11-01 NOTE — Telephone Encounter (Signed)
The patient called to request labs drawn because his PCP called today and cancelled.  He has an appointment with Dr. Gasper Sells Tuesday.  He would like to know if Dr. Gasper Sells will check BMET/CBC and if so, would he like it done prior to the visit or day of the appointment.  Will send to Dr. Gasper Sells and his nurse for advisement.

## 2021-11-01 NOTE — Telephone Encounter (Signed)
Scheduled the patient for lab work tomorrow per Dr. Gasper Sells. [name] was grateful for call and agrees with plan.   Confirmed labs to be ordered: BMET, BNP, CBC.

## 2021-11-02 ENCOUNTER — Ambulatory Visit: Payer: PPO | Attending: Internal Medicine

## 2021-11-02 DIAGNOSIS — I5021 Acute systolic (congestive) heart failure: Secondary | ICD-10-CM

## 2021-11-03 LAB — CBC WITH DIFFERENTIAL/PLATELET
Basophils Absolute: 0 10*3/uL (ref 0.0–0.2)
Basos: 1 %
EOS (ABSOLUTE): 0.3 10*3/uL (ref 0.0–0.4)
Eos: 4 %
Hematocrit: 34.7 % — ABNORMAL LOW (ref 37.5–51.0)
Hemoglobin: 10.4 g/dL — ABNORMAL LOW (ref 13.0–17.7)
Immature Grans (Abs): 0 10*3/uL (ref 0.0–0.1)
Immature Granulocytes: 0 %
Lymphocytes Absolute: 1.5 10*3/uL (ref 0.7–3.1)
Lymphs: 19 %
MCH: 23.3 pg — ABNORMAL LOW (ref 26.6–33.0)
MCHC: 30 g/dL — ABNORMAL LOW (ref 31.5–35.7)
MCV: 78 fL — ABNORMAL LOW (ref 79–97)
Monocytes Absolute: 1.2 10*3/uL — ABNORMAL HIGH (ref 0.1–0.9)
Monocytes: 16 %
Neutrophils Absolute: 4.7 10*3/uL (ref 1.4–7.0)
Neutrophils: 60 %
Platelets: 340 10*3/uL (ref 150–450)
RBC: 4.46 x10E6/uL (ref 4.14–5.80)
RDW: 21.6 % — ABNORMAL HIGH (ref 11.6–15.4)
WBC: 7.8 10*3/uL (ref 3.4–10.8)

## 2021-11-03 LAB — BASIC METABOLIC PANEL
BUN/Creatinine Ratio: 20 (ref 10–24)
BUN: 10 mg/dL (ref 8–27)
CO2: 32 mmol/L — ABNORMAL HIGH (ref 20–29)
Calcium: 8.8 mg/dL (ref 8.6–10.2)
Chloride: 89 mmol/L — ABNORMAL LOW (ref 96–106)
Creatinine, Ser: 0.51 mg/dL — ABNORMAL LOW (ref 0.76–1.27)
Glucose: 70 mg/dL (ref 70–99)
Potassium: 5.2 mmol/L (ref 3.5–5.2)
Sodium: 131 mmol/L — ABNORMAL LOW (ref 134–144)
eGFR: 109 mL/min/{1.73_m2} (ref >59)

## 2021-11-03 LAB — PRO B NATRIURETIC PEPTIDE: NT-Pro BNP: 308 pg/mL (ref 0–376)

## 2021-11-06 NOTE — Progress Notes (Unsigned)
Cardiology Office Note:    Date:  11/07/2021   ID:  Roberto Ponce, DOB [birth year], MRN [id number]  PCP:  Kristen Loader, [place] Prov[facility] Cardiologist:  Werner Lean, MD     Referring MD: Kristen Loader, FNP   CC:  SOB Follow up  History of Present Illness:    Roberto Ponce is a 71 y.o. male with a hx of CAD (prior MI with LIMA and RIMA in 1988), HLD, , MVP with Mod-Severe MR (not felt to be a candidate for mTEER, saw Dr. 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  Has had PE in the past.  Has had protein calorie malnutrition.  Has had new anemia.  Has had chronic SIAD. Much of his decompensation has been in 2023 after (but not because of) 12/22 DES to LCX.  2023:  At last visit, planned for colonscopy, anemia has improved on labs prior to evaluation.GDMT tolerated was coreg, SGLT2i and MRA.  Low dose Zetia (statin stopped because of unclear tranaminitis).  Had improved late August with steroids. Has prior PE (provoked from hospitalization).  He is off [facility] because of GI bleed  Patient notes that he is doing poorly.   He still have shortness of breath. He stopped PO iron because of constipation. Notes DOE Notes weight loss. No palpitations. No syncope.  Past Medical History:  Diagnosis Date   Asthma    BOOP (bronchiolitis obliterans with organizing pneumonia) ([place])    Chronic respiratory failure ([place])    Coronary artery disease    DVT (deep venous thrombosis) (HCC)    Glaucoma    Heart failure with mid-range ejection fraction (HCC)    History of gallstones    Hyperlipidemia    Hyponatremia    Hypotension due to medication 06/22/2021   LVH (left ventricular hypertrophy)    MI (myocardial infarction) ([place])    Mitral regurgitation    Mixed dyslipidemia    Pneumomediastinum (HCC)    Protein calorie malnutrition ([place])    Shock ([place]) 06/22/2021   Venous stasis     Past Surgical History:   Procedure Laterality Date   CARDIAC CATHETERIZATION  09/09/1986   EF 56%   CARDIOVASCULAR STRESS TEST  12/16/2007   EF 54% NO ISCHEMIA   CHOLECYSTECTOMY     COLONOSCOPY WITH PROPOFOL N/A 09/30/2021   Procedure: COLONOSCOPY WITH PROPOFOL;  Surgeon: Otis Brace, MD;  Location: [facility];  Service: Gastroenterology;  Laterality: N/A;   CORONARY ARTERY BYPASS GRAFT     LIMA TO THE LAD AND RIMA TO RIGHT CORONARY ARTERY   CORONARY STENT INTERVENTION N/A 02/14/2021   Procedure: CORONARY STENT INTERVENTION;  Surgeon: Sherren Mocha, MD;  Location: [place] [facility];  Service: Cardiovascular;  Laterality: N/A;   ESOPHAGOGASTRODUODENOSCOPY (EGD) WITH PROPOFOL N/A 09/30/2021   Procedure: ESOPHAGOGASTRODUODENOSCOPY (EGD) WITH PROPOFOL;  Surgeon: Otis Brace, MD;  Location: [place];  Service: Gastroenterology;  Laterality: N/A;   ORCHIECTOMY Right 04/19/2021   Procedure: RIGHT RADICAL INGUINAL ORCHIECTOMY;  Surgeon: Lucas Mallow, MD;  Location: [facility];  Service: Urology;  Laterality: Right;  1 HR FOR CASE   RIGHT/LEFT HEART CATH AND CORONARY/GRAFT ANGIOGRAPHY N/A 02/14/2021   Procedure: RIGHT/LEFT HEART CATH AND CORONARY/GRAFT ANGIOGRAPHY;  Surgeon: Sherren Mocha, MD;  Location: [place] [facility];  Service: Cardiovascular;  Laterality: N/A;   TEE WITHOUT CARDIOVERSION N/A 09/02/2020   Procedure: TRANSESOPHAGEAL ECHOCARDIOGRAM (TEE);  Surgeon: Werner Lean, MD;  Location: [facility];  Service: Cardiovascular;  Laterality: N/A;   US ECHOCARDIOGRAPHY  12/12/2007   EF 55-60%   VASECTOMY  1988    Current Medications: Current Meds  Medication Sig   acetaminophen (TYLENOL) 500 MG tablet Take 1,000 mg by mouth every 6 (six) hours as needed for moderate pain.   aspirin EC 81 MG tablet Take 1 tablet (81 mg total) by mouth daily. Swallow whole.   brimonidine (ALPHAGAN) 0.2 % ophthalmic solution Place 1 drop into the left eye 2 (two) times daily.   carvedilol (COREG)  3.125 MG tablet Take 1 tablet (3.125 mg total) by mouth 2 (two) times daily with a meal.   cyanocobalamin 1000 MCG tablet Take 1 tablet (1,000 mcg total) by mouth daily.   dapagliflozin propanediol (FARXIGA) 10 MG TABS tablet Take 1 tablet (10 mg total) by mouth daily.   dorzolamide-timolol (COSOPT) 22.3-6.8 MG/ML ophthalmic solution Place 1 drop into both eyes 2 (two) times daily.   ezetimibe (ZETIA) 10 MG tablet TAKE 1 TABLET(10 MG) BY MOUTH DAILY   feeding supplement (ENSURE ENLIVE / ENSURE PLUS) LIQD Take 237 mLs by mouth 2 (two) times daily between meals.   furosemide (LASIX) 40 MG tablet Take 1 tablet by mouth on Mondays, Wednesdays, and Fridays only   latanoprost (XALATAN) 0.005 % ophthalmic solution Place 1 drop into both eyes at bedtime.    magic mouthwash (nystatin, lidocaine, diphenhydrAMINE) suspension Take 5 mLs by mouth 3 (three) times daily as needed for mouth pain.   nitroGLYCERIN (NITROSTAT) 0.4 MG SL tablet Place 1 tablet (0.4 mg total) under the tongue every 5 (five) minutes as needed for chest pain.   pantoprazole (PROTONIX) 40 MG tablet 40 mg BID x 30 days then once daily after   SODIUM FLUORIDE 5000 PPM 1.1 % PSTE Apply 1 application. topically in the morning and at bedtime.   spironolactone (ALDACTONE) 25 MG tablet Take 0.5 tablets (12.5 mg total) by mouth daily.   triamcinolone cream (KENALOG) 0.1 % Apply 1 application topically 2 (two) times daily as needed (eczema).     Allergies:   Patient has no known allergies.   Social History   Socioeconomic History   Marital status: Married    Spouse name: Not on file   Number of children: Not on file   Years of education: Not on file   Highest education level: Not on file  Occupational History   Not on file  Tobacco Use   Smoking status: Never   Smokeless tobacco: Never  Vaping Use   Vaping Use: Never used  Substance and Sexual Activity   Alcohol use: No   Drug use: No   Sexual activity: Not on file  Other Topics  Concern   Not on file  Social History Narrative   Not on file   Social Determinants of Health   Financial Resource Strain: Not on file  Food Insecurity: Not on file  Transportation Needs: Not on file  Physical Activity: Not on file  Stress: Not on file  Social Connections: Not on file     Family History: The patient's family history includes Heart attack in his father; Stroke in his father.  ROS:   Please see the history of present illness.     All other systems reviewed and are negative.  EKGs/Labs/Other Studies Reviewed:    The following studies were reviewed today:   RIGHT/LEFT HEART CATH AND CORONARY/GRAFT ANGIOGRAPHY, RIGHT/LEFT HEART CATH AND CORONARY/GRAFT ANGIOGRAPHY 02/14/2021  Narrative 1.  Severe  3-vessel coronary artery disease with total occlusion of the LAD and total occlusion of the RCA, and severe stenosis of the native left circumflex 2.  Status post CABG with continued patency of the LIMA to LAD and RIMA to RCA 3.  Successful PCI of severe 95% stenosis of the mid circumflex, treated with a 3.0 x 15 mm Onyx frontier DES 4.  Severe mitral regurgitation by echo assessment, hemodynamics demonstrate low wedge pressure, no V wave, and no pulmonary hypertension.  Recommendations: DAPT with aspirin and clopidogrel x6 months, repeat echocardiogram in 6 months, defer transcatheter edge-to-edge mitral valve repair at present to reevaluate after PCI.      ECHO COMPLETE WO IMAGING ENHANCING AGENT 07/05/2021  Narrative ECHOCARDIOGRAM REPORT    Patient Name:   ORENTHAL DEBSKI Date of Exam: 07/05/2021 Medical Rec #:  [id number]           Height:       69.0 in Accession #:    [id number]          Weight:       145.7 lb Date of Birth:  October 25, 1950           BSA:          1.806 m Patient Age:    57 years            BP:           102/61 mmHg Patient Gender: M                   HR:           76 bpm. Exam Location:  Inpatient  Procedure: 2D Echo, Cardiac Doppler and  Color Doppler  Indications:    Acute respiratory distress  History:        Patient has prior history of Echocardiogram examinations, most recent 06/20/2021. CHF, CAD and Previous Myocardial Infarction, Signs/Symptoms:Murmur; Risk Factors:Dyslipidemia. CABG x 2.  Sonographer:    Luisa Hart RDCS Referring Phys: [id number] [place]   1. Left ventricular ejection fraction, by estimation, is 40 to 45%. The left ventricle has mildly decreased function. The left ventricle demonstrates regional wall motion abnormalities (see scoring diagram/findings for description). There is mild concentric left ventricular hypertrophy. Left ventricular diastolic parameters are indeterminate. There is akinesis of the left ventricular, basal-mid inferior wall. There is hypokinesis of the left ventricular, mid-apical inferolateral wall and inferior wall. 2. Right ventricular systolic function is normal. The right ventricular size is normal. There is mildly elevated pulmonary artery systolic pressure. 3. The mitral valve is abnormal. Mild to moderate mitral valve regurgitation. 4. Tricuspid valve regurgitation is mild to moderate. 5. The aortic valve is normal in structure. Aortic valve regurgitation is not visualized. No aortic stenosis is present.  Comparison(s): No significant change from prior study. Prior images reviewed side by side.  FINDINGS Left Ventricle: Left ventricular ejection fraction, by estimation, is 40 to 45%. The left ventricle has mildly decreased function. The left ventricle demonstrates regional wall motion abnormalities. The left ventricular internal cavity size was normal in size. There is mild concentric left ventricular hypertrophy. Left ventricular diastolic parameters are indeterminate.  Right Ventricle: The right ventricular size is normal. Right vetricular wall thickness was not well visualized. Right ventricular systolic function is normal. There is mildly elevated  pulmonary artery systolic pressure. The tricuspid regurgitant velocity is 2.71 m/s, and with an assumed right atrial pressure of 8 mmHg, the estimated right ventricular systolic pressure is 02.5 mmHg.  Left Atrium: Left atrial size was normal in size.  Right Atrium: Right atrial size was normal in size.  Pericardium: There is no evidence of pericardial effusion.  Mitral Valve: The mitral valve is abnormal. Mild to moderate mitral valve regurgitation, with eccentric posteriorly directed jet.  Tricuspid Valve: The tricuspid valve is normal in structure. Tricuspid valve regurgitation is mild to moderate. No evidence of tricuspid stenosis.  Aortic Valve: The aortic valve is normal in structure. Aortic valve regurgitation is not visualized. No aortic stenosis is present. Aortic valve mean gradient measures 3.0 mmHg. Aortic valve peak gradient measures 6.7 mmHg. Aortic valve area, by VTI measures 2.55 cm.  Pulmonic Valve: The pulmonic valve was grossly normal. Pulmonic valve regurgitation is not visualized. No evidence of pulmonic stenosis.  Aorta: The aortic root, ascending aorta and aortic arch are all structurally normal, with no evidence of dilitation or obstruction.  Venous: The inferior vena cava was not well visualized.  IAS/Shunts: The interatrial septum was not well visualized.   Recent Labs: 07/04/2021: B Natriuretic Peptide 89.9 07/08/2021: Magnesium 2.3 09/28/2021: ALT 21 11/02/2021: BUN 10; Creatinine, Ser 0.51; Hemoglobin 10.4; NT-Pro BNP 308; Platelets 340; Potassium 5.2; Sodium 131  Recent Lipid Panel    Component Value Date/Time   CHOL 136 06/16/2020 0954   TRIG 86 06/21/2021 0358   HDL 34 (L) 06/16/2020 0954   CHOLHDL 4.0 06/16/2020 0954   LDLCALC 86 06/16/2020 0954    Physical Exam:    VS:  BP (!) 98/58   Pulse 77   Ht '5\' 9"'$  (1.753 m)   Wt 133 lb (60.3 kg)   SpO2 97%   BMI 19.64 kg/m     Wt Readings from Last 3 Encounters:  11/07/21 133 lb (60.3 kg)   10/17/21 128 lb (58.1 kg)  09/29/21 139 lb 5.3 oz (63.2 kg)     GEN: chronically ill appearing HEENT: Normal NECK: No JVD LYMPHATICS: No lymphadenopathy CARDIAC: RRR, no rubs, gallops; systolic heart murmur RESPIRATORY:  Decreased breath sounds in bases no  ABDOMEN: Soft, non-tender, non-distended MUSCULOSKELETAL:  No edema; No deformity  SKIN: Warm and dry NEUROLOGIC:  Alert and oriented x 3 PSYCHIATRIC:  Normal affect   ASSESSMENT:    1. Mitral valve insufficiency, unspecified etiology   2. Non-rheumatic mitral regurgitation   3. Nonrheumatic mitral valve regurgitation   4. History of SIADH   5. Coronary artery disease involving coronary bypass graft of native heart without angina pectoris   6. S/P CABG x 2    PLAN:     CAD s/p CABG with 2022 DES to LCX (no NSTEMI) HLD Complicated by IDA Recent provoked DVT complicated by GI bleed Testicular cancer s/p therapy - he is restarting PO iron with stool softener - ASA with no AC on DAPT - No PRN ntiro needed - will reach out to Onc for their thoughts on IV iron  Chronic DOE BOOP, managed by primary MVP with eccentric MR, not a candidate for procedural evauluation HFmrEF - complicated by chronic hyponatremia - continue coreg 3.125, if worsening hypotension we can hold this Continue lasix MWF; BNP WNL, if worsening hypotension stop aldactone  Long term- I worry about his long term prognosis. I have reviewed his protein calorie malnutrition, his weight loss, his O2 requirements, his PE, his anemia, his cancer and his deterioration over the past 1.5 years.  I worry about his long term outcomes.  Presently he is transitioning to a new PCP. Discussed with wife.  At any point,  if he would like [place] to refer to outpatient Palliative we are willing to do so  Time Spent Directly with Patient:   I have spent a total of 40 minutes with the patient reviewing notes, imaging, EKGs, labs and examining the patient as well as  establishing an assessment and plan that was discussed personally with the patient.  > 50% of time was spent in direct patient care  family and reviewing imaging.  He is active in church, has a son, daughter, and six grandchildren.      Three months with me  Medication Adjustments/Labs and Tests Ordered: Current medicines are reviewed at length with the patient today.  Concerns regarding medicines are outlined above.  No orders of the defined types were placed in this encounter.  No orders of the defined types were placed in this encounter.   Patient Instructions  Medication Instructions:  Your physician recommends that you continue on your current medications as directed. Please refer to the Current Medication list given to you today.  *If you need a refill on your cardiac medications before your next appointment, please call your pharmacy*   Lab Work: NONE If you have labs (blood work) drawn today and your tests are completely normal, you will receive your results only by: [place] (if you have MyChart) OR A paper copy in the mail If you have any lab test that is abnormal or we need to change your treatment, we will call you to review the results.   Testing/Procedures: NONE   Follow-Up: At [facility], you and your health needs are our priority.  As part of our continuing mission to provide you with exceptional heart care, we have created designated Provider Care Teams.  These Care Teams include your primary Cardiologist (physician) and Advanced Practice Providers (APPs -  Physician Assistants and Nurse Practitioners) who all work together to provide you with the care you need, when you need it.   Your next appointment:   3 year(s)  The format for your next appointment:   In Person  Provider:   Werner Lean, MD     Important Information About Sugar         Signed, Werner Lean, MD  11/07/2021 11:38 AM    [place]

## 2021-11-07 ENCOUNTER — Other Ambulatory Visit: Payer: Self-pay | Admitting: Oncology

## 2021-11-07 ENCOUNTER — Encounter: Payer: Self-pay | Admitting: Internal Medicine

## 2021-11-07 ENCOUNTER — Ambulatory Visit: Payer: PPO | Attending: Internal Medicine | Admitting: Internal Medicine

## 2021-11-07 VITALS — BP 98/58 | HR 77 | Ht 69.0 in | Wt 133.0 lb

## 2021-11-07 DIAGNOSIS — Z951 Presence of aortocoronary bypass graft: Secondary | ICD-10-CM

## 2021-11-07 DIAGNOSIS — I25810 Atherosclerosis of coronary artery bypass graft(s) without angina pectoris: Secondary | ICD-10-CM | POA: Diagnosis not present

## 2021-11-07 DIAGNOSIS — I340 Nonrheumatic mitral (valve) insufficiency: Secondary | ICD-10-CM

## 2021-11-07 DIAGNOSIS — D508 Other iron deficiency anemias: Secondary | ICD-10-CM

## 2021-11-07 DIAGNOSIS — Z8639 Personal history of other endocrine, nutritional and metabolic disease: Secondary | ICD-10-CM | POA: Diagnosis not present

## 2021-11-07 DIAGNOSIS — D509 Iron deficiency anemia, unspecified: Secondary | ICD-10-CM | POA: Insufficient documentation

## 2021-11-07 NOTE — Patient Instructions (Signed)
Medication Instructions:  Your physician recommends that you continue on your current medications as directed. Please refer to the Current Medication list given to you today.  *If you need a refill on your cardiac medications before your next appointment, please call your pharmacy*   Lab Work: NONE If you have labs (blood work) drawn today and your tests are completely normal, you will receive your results only by: [place] (if you have MyChart) OR A paper copy in the mail If you have any lab test that is abnormal or we need to change your treatment, we will call you to review the results.   Testing/Procedures: NONE   Follow-Up: At [facility], you and your health needs are our priority.  As part of our continuing mission to provide you with exceptional heart care, we have created designated Provider Care Teams.  These Care Teams include your primary Cardiologist (physician) and Advanced Practice Providers (APPs -  Physician Assistants and Nurse Practitioners) who all work together to provide you with the care you need, when you need it.   Your next appointment:   3 year(s)  The format for your next appointment:   In Person  Provider:   Werner Lean, MD     Important Information About Sugar

## 2021-11-08 ENCOUNTER — Telehealth: Payer: Self-pay | Admitting: Oncology

## 2021-11-08 NOTE — Telephone Encounter (Signed)
Per 9/19 in basket, message has been left

## 2021-11-13 DIAGNOSIS — I5022 Chronic systolic (congestive) heart failure: Secondary | ICD-10-CM | POA: Diagnosis not present

## 2021-11-13 DIAGNOSIS — J9621 Acute and chronic respiratory failure with hypoxia: Secondary | ICD-10-CM | POA: Diagnosis not present

## 2021-11-14 ENCOUNTER — Other Ambulatory Visit: Payer: Self-pay

## 2021-11-14 ENCOUNTER — Inpatient Hospital Stay: Payer: PPO | Attending: Oncology

## 2021-11-14 ENCOUNTER — Inpatient Hospital Stay: Payer: PPO

## 2021-11-14 VITALS — BP 97/57 | HR 83 | Temp 98.1°F | Resp 18

## 2021-11-14 DIAGNOSIS — D509 Iron deficiency anemia, unspecified: Secondary | ICD-10-CM | POA: Insufficient documentation

## 2021-11-14 DIAGNOSIS — D508 Other iron deficiency anemias: Secondary | ICD-10-CM

## 2021-11-14 LAB — CBC WITH DIFFERENTIAL (CANCER CENTER ONLY)
Abs Immature Granulocytes: 0.02 10*3/uL (ref 0.00–0.07)
Basophils Absolute: 0.1 10*3/uL (ref 0.0–0.1)
Basophils Relative: 1 %
Eosinophils Absolute: 0.2 10*3/uL (ref 0.0–0.5)
Eosinophils Relative: 2 %
HCT: 36.5 % — ABNORMAL LOW (ref 39.0–52.0)
Hemoglobin: 11.1 g/dL — ABNORMAL LOW (ref 13.0–17.0)
Immature Granulocytes: 0 %
Lymphocytes Relative: 17 %
Lymphs Abs: 1.8 10*3/uL (ref 0.7–4.0)
MCH: 23.3 pg — ABNORMAL LOW (ref 26.0–34.0)
MCHC: 30.4 g/dL (ref 30.0–36.0)
MCV: 76.5 fL — ABNORMAL LOW (ref 80.0–100.0)
Monocytes Absolute: 1.1 10*3/uL — ABNORMAL HIGH (ref 0.1–1.0)
Monocytes Relative: 10 %
Neutro Abs: 7.8 10*3/uL — ABNORMAL HIGH (ref 1.7–7.7)
Neutrophils Relative %: 70 %
Platelet Count: 395 10*3/uL (ref 150–400)
RBC: 4.77 MIL/uL (ref 4.22–5.81)
RDW: 21.1 % — ABNORMAL HIGH (ref 11.5–15.5)
WBC Count: 10.9 10*3/uL — ABNORMAL HIGH (ref 4.0–10.5)
nRBC: 0 % (ref 0.0–0.2)

## 2021-11-14 LAB — IRON AND IRON BINDING CAPACITY (CC-WL,HP ONLY)
Iron: 11 ug/dL — ABNORMAL LOW (ref 45–182)
Saturation Ratios: 5 % — ABNORMAL LOW (ref 17.9–39.5)
TIBC: 242 ug/dL — ABNORMAL LOW (ref 250–450)
UIBC: 231 ug/dL (ref 117–376)

## 2021-11-14 LAB — FERRITIN: Ferritin: 44 ng/mL (ref 24–336)

## 2021-11-14 MED ORDER — SODIUM CHLORIDE 0.9 % IV SOLN: Freq: Once | INTRAVENOUS | Status: AC

## 2021-11-14 MED ORDER — SODIUM CHLORIDE 0.9 % IV SOLN
200.0000 mg | Freq: Once | INTRAVENOUS | Status: AC
  Administered 2021-11-14: 200 mg via INTRAVENOUS
  Filled 2021-11-14: qty 200

## 2021-11-14 NOTE — Patient Instructions (Signed)

## 2021-11-14 NOTE — Progress Notes (Signed)
Patient declined to stay 30 minute post infusion observation period. VSS, and no signs of distress noted upon discharge.

## 2021-11-17 ENCOUNTER — Telehealth: Payer: Self-pay | Admitting: Pulmonary Disease

## 2021-11-17 NOTE — Telephone Encounter (Signed)
Dr. Erin Fulling called about CT scan and the patient and his wife have questions since seeing his heart doctor.  He saw his heart doctor and was told his lungs were pretty bad.  He made it sound like he was not going to get any better.  They want to know if he definitely had pulmonary fibrosis and the severity.  Dr. Erin Fulling, please advise regarding CT results and whether patient has pulmonary fibrosis and the severity of his condition.  Thank you.

## 2021-11-17 NOTE — Telephone Encounter (Signed)
Patient does have scarring on the lungs consistent with pulmonary fibrosis. As I discussed with them at the last office visit, I am concerned this is going to continue to progress unfortunately.   I believe there has been significant irreversible damage to his lungs from his recent hospitalization.  Thanks, JD

## 2021-11-20 NOTE — Telephone Encounter (Signed)
This is a difficult question to answer but possibly months to a year or so. This all depends if other complications were to arise in his health. We can place a palliative care consult if they would like to have more assistance with his care.  Thanks, JD

## 2021-11-20 NOTE — Telephone Encounter (Signed)
Called and spoke with patient. Patient verbalized understanding. Patient also wanted to know if there is a specific time frame for him to live. Patient stated that 2 weeks ago he had an appointment with a heart doctor and they told him that this was "the end of life". Patient wanted me to send a message to ask if there is a specific time frame he has to live.   JD, please advise.

## 2021-11-20 NOTE — Telephone Encounter (Signed)
Called and spoke with patient. Patient verbalized understanding. Patient says he is okay on the palliative care right now but I advised patient if he changed his mind, he could give [place] a call.   Nothing further needed.

## 2021-11-21 ENCOUNTER — Inpatient Hospital Stay: Payer: PPO | Attending: Oncology

## 2021-11-21 VITALS — BP 110/64 | HR 78 | Temp 98.3°F | Resp 18

## 2021-11-21 DIAGNOSIS — D509 Iron deficiency anemia, unspecified: Secondary | ICD-10-CM | POA: Diagnosis not present

## 2021-11-21 DIAGNOSIS — D508 Other iron deficiency anemias: Secondary | ICD-10-CM

## 2021-11-21 MED ORDER — SODIUM CHLORIDE 0.9 % IV SOLN: Freq: Once | INTRAVENOUS | Status: AC

## 2021-11-21 MED ORDER — SODIUM CHLORIDE 0.9 % IV SOLN
200.0000 mg | Freq: Once | INTRAVENOUS | Status: AC
  Administered 2021-11-21: 200 mg via INTRAVENOUS
  Filled 2021-11-21: qty 200

## 2021-11-21 NOTE — Patient Instructions (Signed)

## 2021-11-21 NOTE — Progress Notes (Signed)
Pt declined to stay full 30 minute post iron observation. VSS at discharge

## 2021-11-28 ENCOUNTER — Inpatient Hospital Stay: Payer: PPO

## 2021-11-28 VITALS — BP 110/69 | HR 74 | Temp 98.1°F | Resp 18

## 2021-11-28 DIAGNOSIS — D509 Iron deficiency anemia, unspecified: Secondary | ICD-10-CM | POA: Diagnosis not present

## 2021-11-28 DIAGNOSIS — D508 Other iron deficiency anemias: Secondary | ICD-10-CM

## 2021-11-28 MED ORDER — SODIUM CHLORIDE 0.9 % IV SOLN
200.0000 mg | Freq: Once | INTRAVENOUS | Status: AC
  Administered 2021-11-28: 200 mg via INTRAVENOUS
  Filled 2021-11-28: qty 200

## 2021-11-28 MED ORDER — SODIUM CHLORIDE 0.9 % IV SOLN: Freq: Once | INTRAVENOUS | Status: AC

## 2021-11-28 NOTE — Patient Instructions (Signed)

## 2021-11-28 NOTE — Progress Notes (Signed)
Pt declined to stay for post 30 obs, discharged with VSS, wheelchair to lobby

## 2021-12-05 ENCOUNTER — Inpatient Hospital Stay: Payer: PPO

## 2021-12-05 VITALS — BP 110/75 | HR 75 | Temp 97.7°F | Resp 17

## 2021-12-05 DIAGNOSIS — D508 Other iron deficiency anemias: Secondary | ICD-10-CM

## 2021-12-05 DIAGNOSIS — D509 Iron deficiency anemia, unspecified: Secondary | ICD-10-CM | POA: Diagnosis not present

## 2021-12-05 MED ORDER — SODIUM CHLORIDE 0.9 % IV SOLN
200.0000 mg | Freq: Once | INTRAVENOUS | Status: AC
  Administered 2021-12-05: 200 mg via INTRAVENOUS
  Filled 2021-12-05: qty 200

## 2021-12-05 MED ORDER — SODIUM CHLORIDE 0.9 % IV SOLN: Freq: Once | INTRAVENOUS | Status: AC

## 2021-12-05 NOTE — Patient Instructions (Signed)

## 2021-12-13 DIAGNOSIS — I5022 Chronic systolic (congestive) heart failure: Secondary | ICD-10-CM | POA: Diagnosis not present

## 2021-12-13 DIAGNOSIS — J9621 Acute and chronic respiratory failure with hypoxia: Secondary | ICD-10-CM | POA: Diagnosis not present

## 2021-12-14 ENCOUNTER — Ambulatory Visit: Payer: PPO | Admitting: Pulmonary Disease

## 2021-12-26 ENCOUNTER — Ambulatory Visit: Payer: PPO | Admitting: Pulmonary Disease

## 2021-12-26 ENCOUNTER — Encounter: Payer: Self-pay | Admitting: Pulmonary Disease

## 2021-12-26 VITALS — BP 114/62 | HR 71 | Ht 69.0 in | Wt 130.0 lb

## 2021-12-26 DIAGNOSIS — J8489 Other specified interstitial pulmonary diseases: Secondary | ICD-10-CM | POA: Diagnosis not present

## 2021-12-26 DIAGNOSIS — J8410 Pulmonary fibrosis, unspecified: Secondary | ICD-10-CM

## 2021-12-26 NOTE — Progress Notes (Signed)
Synopsis: Referred in June 2023 for hospital follow up  Subjective:   PATIENT ID: Roberto Ponce GENDER: male DOB: 11-01-1950, MRN: [id number]  HPI  Chief Complaint  Patient presents with   Follow-up    2 mo f/u. Still using 2L of O2. SOB tends to increase after eating and with exertion.    Roberto Ponce is a 71 year old male, never smoker with coronary artery disease, moderate-severe mitral regurgitation and testicular cancer s/p orchiectomy who returns to pulmonary clinic for chronic hypoxemic respiratory failure due to viral pneumonia with post-viral BOOP and pneumomediastinum.   He has been stable since last visit on 2L of supplemental oxygen. His weight is stable.   Upon review of chest imaging, I was able to access the CT Chest/Abdomen/Pelvis scans done by his Urology team in January 2023. He had UIP pattern based on CT Chest scan at that time. He likely had IPF flare during his hospitalizations earlier this year with progression of disease.  He is amenable to pulmonary rehab referral.   OV 10/17/21 He was admitted 8/10 to 8/13 for symptomatic anemia due to GI bleed. He was taken off eliquis. EGD showed LA grade B reflux esophagitis. Colonoscopy did not indicate source of bleeding. He reports feeling better since discharge. He is becoming a bit more active.   He has tapered down to '5mg'$  of prednisone per day.  He denies fevers, chills or night sweats. Chest radiograph 8/10 showed stable chronic fibrotic changes mainly of the left lung.   OV 09/01/21 He reports he is feeling better since last visit with the increase in steroids to '30mg'$  daily. He remains on bactrim prophlaxis.   He is using 2L of oxygen. He is ambulating more. His appetite is better and he is maintaining his weight.   He complains of sores on his tongue and mouth.   Past Medical History:  Diagnosis Date   Asthma    BOOP (bronchiolitis obliterans with organizing pneumonia) ([place])    Chronic  respiratory failure ([place])    Coronary artery disease    DVT (deep venous thrombosis) ([facility])    Glaucoma    Heart failure with mid-range ejection fraction ([facility])    History of gallstones    Hyperlipidemia    Hyponatremia    Hypotension due to medication 06/22/2021   LVH (left ventricular hypertrophy)    MI (myocardial infarction) ([place])    Mitral regurgitation    Mixed dyslipidemia    Pneumomediastinum ([place])    Protein calorie malnutrition ([place])    Shock ([place]) 06/22/2021   Venous stasis      Family History  Problem Relation Age of Onset   Heart attack Father    Stroke Father      Social History   Socioeconomic History   Marital status: Married    Spouse name: Not on file   Number of children: Not on file   Years of education: Not on file   Highest education level: Not on file  Occupational History   Not on file  Tobacco Use   Smoking status: Never   Smokeless tobacco: Never  Vaping Use   Vaping Use: Never used  Substance and Sexual Activity   Alcohol use: No   Drug use: No   Sexual activity: Not on file  Other Topics Concern   Not on file  Social History Narrative   Not on file   Social Determinants of Health   Financial Resource Strain: Not on file  Food  Insecurity: Not on file  Transportation Needs: Not on file  Physical Activity: Not on file  Stress: Not on file  Social Connections: Not on file  Intimate Partner Violence: Not on file     No Known Allergies   Outpatient Medications Prior to Visit  Medication Sig Dispense Refill   acetaminophen (TYLENOL) 500 MG tablet Take 1,000 mg by mouth every 6 (six) hours as needed for moderate pain.     aspirin EC 81 MG tablet Take 1 tablet (81 mg total) by mouth daily. Swallow whole. 90 tablet 3   brimonidine (ALPHAGAN) 0.2 % ophthalmic solution Place 1 drop into the left eye 2 (two) times daily.     carvedilol (COREG) 3.125 MG tablet Take 1 tablet (3.125 mg total) by mouth 2 (two) times daily with a meal. 60  tablet 11   cyanocobalamin 1000 MCG tablet Take 1 tablet (1,000 mcg total) by mouth daily.     dapagliflozin propanediol (FARXIGA) 10 MG TABS tablet Take 1 tablet (10 mg total) by mouth daily. 30 tablet 11   dorzolamide-timolol (COSOPT) 22.3-6.8 MG/ML ophthalmic solution Place 1 drop into both eyes 2 (two) times daily.     ezetimibe (ZETIA) 10 MG tablet TAKE 1 TABLET(10 MG) BY MOUTH DAILY 90 tablet 1   feeding supplement (ENSURE ENLIVE / ENSURE PLUS) LIQD Take 237 mLs by mouth 2 (two) times daily between meals. 237 mL 12   latanoprost (XALATAN) 0.005 % ophthalmic solution Place 1 drop into both eyes at bedtime.      magic mouthwash (nystatin, lidocaine, diphenhydrAMINE) suspension Take 5 mLs by mouth 3 (three) times daily as needed for mouth pain. 180 mL 1   nitroGLYCERIN (NITROSTAT) 0.4 MG SL tablet Place 1 tablet (0.4 mg total) under the tongue every 5 (five) minutes as needed for chest pain. 25 tablet 3   pantoprazole (PROTONIX) 40 MG tablet 40 mg BID x 30 days then once daily after 60 tablet 1   SODIUM FLUORIDE 5000 PPM 1.1 % PSTE Apply 1 application. topically in the morning and at bedtime.     spironolactone (ALDACTONE) 25 MG tablet Take 0.5 tablets (12.5 mg total) by mouth daily. 30 tablet 2   triamcinolone cream (KENALOG) 0.1 % Apply 1 application topically 2 (two) times daily as needed (eczema).     furosemide (LASIX) 40 MG tablet Take 1 tablet by mouth on Mondays, Wednesdays, and Fridays only (Patient not taking: Reported on 12/26/2021) 40 tablet 3   No facility-administered medications prior to visit.   Review of Systems  Constitutional:  Positive for malaise/fatigue. Negative for chills, fever and weight loss.  HENT:  Negative for congestion, sinus pain and sore throat.   Eyes: Negative.   Respiratory:  Positive for shortness of breath. Negative for cough, hemoptysis, sputum production and wheezing.   Cardiovascular:  Negative for chest pain, palpitations, orthopnea, claudication and  leg swelling.  Gastrointestinal:  Negative for abdominal pain, heartburn, nausea and vomiting.  Genitourinary: Negative.   Musculoskeletal:  Negative for joint pain and myalgias.  Skin:  Negative for rash.  Neurological:  Negative for weakness.  Endo/Heme/Allergies: Negative.   Psychiatric/Behavioral: Negative.      Objective:   Vitals:   12/26/21 1315  BP: 114/62  Pulse: 71  SpO2: 98%  Weight: 130 lb (59 kg)  Height: '5\' 9"'$  (1.753 m)   Physical Exam Constitutional:      General: He is not in acute distress.    Appearance: He is cachectic. He is ill-appearing (  chronically).  HENT:     Head: Normocephalic and atraumatic.  Eyes:     Extraocular Movements: Extraocular movements intact.     Conjunctiva/sclera: Conjunctivae normal.     Pupils: Pupils are equal, round, and reactive to light.  Cardiovascular:     Rate and Rhythm: Normal rate and regular rhythm.     Pulses: Normal pulses.     Heart sounds: Normal heart sounds. No murmur heard. Pulmonary:     Breath sounds: Decreased air movement present. Decreased breath sounds and rales present.  Abdominal:     General: Bowel sounds are normal.     Palpations: Abdomen is soft.  Musculoskeletal:     Right lower leg: No edema.     Left lower leg: No edema.  Skin:    General: Skin is warm and dry.  Neurological:     General: No focal deficit present.  Psychiatric:        Mood and Affect: Mood normal.        Behavior: Behavior normal.        Thought Content: Thought content normal.        Judgment: Judgment normal.    CBC    Component Value Date/Time   WBC 10.9 (H) 11/14/2021 1345   WBC 8.5 10/01/2021 1028   RBC 4.77 11/14/2021 1345   HGB 11.1 (L) 11/14/2021 1345   HGB 10.4 (L) 11/02/2021 1357   HCT 36.5 (L) 11/14/2021 1345   HCT 34.7 (L) 11/02/2021 1357   PLT 395 11/14/2021 1345   PLT 340 11/02/2021 1357   MCV 76.5 (L) 11/14/2021 1345   MCV 78 (L) 11/02/2021 1357   MCH 23.3 (L) 11/14/2021 1345   MCHC 30.4  11/14/2021 1345   RDW 21.1 (H) 11/14/2021 1345   RDW 21.6 (H) 11/02/2021 1357   LYMPHSABS 1.8 11/14/2021 1345   LYMPHSABS 1.5 11/02/2021 1357   MONOABS 1.1 (H) 11/14/2021 1345   EOSABS 0.2 11/14/2021 1345   EOSABS 0.3 11/02/2021 1357   BASOSABS 0.1 11/14/2021 1345   BASOSABS 0.0 11/02/2021 1357      Latest Ref Rng & Units 11/02/2021    1:57 PM 10/01/2021   10:28 AM 09/30/2021    7:02 AM  BMP  Glucose 70 - 99 mg/dL 70  113  85   BUN 8 - 27 mg/dL '10  7  9   '$ Creatinine 0.76 - 1.27 mg/dL 0.51  0.63  0.67   BUN/Creat Ratio 10 - 24 20     Sodium 134 - 144 mmol/L 131  134  131   Potassium 3.5 - 5.2 mmol/L 5.2  3.9  3.7   Chloride 96 - 106 mmol/L 89  98  98   CO2 20 - 29 mmol/L 32  31  27   Calcium 8.6 - 10.2 mg/dL 8.8  8.3  8.3    Chest imaging: CT Chest 10/26/21 1. Stable changes of bilateral pulmonary fibrosis left worse than right with associated bronchiectatic changes. Small left pleural effusion slightly worse. Stable subtle hazy airspace density over the left apex and left base unchanged. Previously noted tiny left apical pneumothorax no longer visualized. 2. Stable 1.1 cm subcarinal lymph node likely reactive.  Prior CABG. 3. Moderate hiatal hernia. 4. Aortic atherosclerosis.  CXR 09/28/21 Diffuse airspace disease throughout the left lung compatible with fibrotic changes seen on prior CT and plain films, unchanged. No confluent opacity on the right. Prior median sternotomy and CABG. Heart is normal size. Aortic atherosclerosis. No acute bony abnormality.  CXR 09/01/21 1. Persistent small left apical and medial pneumothorax. 2. Interval resolution of pneumomediastinum. 3. Advanced left lung fibrotic changes and chronic scarring. 4. No new acute cardiopulmonary process.  CXR 07/25/21 1. Increasing left basilar airspace disease. 2. Stable small left apical pneumothorax in left pneumomediastinum. 3. Stable chronic interstitial changes.  CXR 07/11/21 Stable cardiomediastinal  contours. Pneumomediastinum is again noted bilaterally. Gas is noted tracking into the lower neck as before. Unchanged asymmetric opacification throughout the left lung. Mild peripheral opacities within the right lower lung is also unchanged. No signs of pneumothorax. Trace left pleural fluid. Unchanged.  CTA Chest 07/10/21 1. Negative for pulmonary embolism to the segmental level. 2. Extensive pneumomediastinum tracking into the low neck and involving the LEFT greater than RIGHT mediastinal border and extending along the esophagus and underneath the crus of the LEFT hemidiaphragm but not extending into the abdomen. 3. Etiology for above process is uncertain. Perhaps cough leading to barotrauma. Esophageal source is another differential consideration. No mediastinal fluid or focal thickening of the esophagus. This does not allow for exclusion of esophageal source but is perhaps reassuring 4. Small associated and partially loculated LEFT-sided pneumothorax at the apex, anteriorly and with a small loculated component in the major fissure. 5. Small LEFT-sided pleural effusion slightly diminished with resolution of RIGHT-sided effusion. 6. Improving airspace disease still with considerable ground-glass and septal thickening in the LEFT chest and worse at the LEFT lung base. 7. Aortic atherosclerosis, coronary artery disease and changes of CABG.  CT Chest/Abd/Pelv 02/2021 1. No evidence of metastatic disease within the chest, abdomen, or pelvis. 2. Enhancing heterogeneous mass in the right testicle better evaluated on prior ultrasound. 3. Saccular aneurysmal dilation of the infrarenal abdominal aorta measuring 3.5 cm. Recommend vascular consultation. Reference: J Vasc Surg 0160;10:9-32. 4. Chronic interstitial lung disease in a likely UIP pattern. 5. Sigmoid colonic diverticulosis without findings of acute diverticulitis. 6. Moderate hiatal hernia. 7. Aortic Atherosclerosis    PFT:     No data to display          Labs:  Path:  Echo 07/05/21: EF 40-45%. RV systolic function and size is normal. Mildly elevated PASP.  Heart Catheterization:  Assessment & Plan:   Pulmonary fibrosis ([place]) - Plan: Pulmonary Function Test, AMB referral to pulmonary rehabilitation, Amb Referral to Palliative Care  Bronchiolitis obliterans with organizing pneumonia [facility])  Discussion: Roberto Ponce is a 71 year old male, never smoker with coronary artery disease, moderate-severe mitral regurgitation and testicular cancer s/p orchiectomy who is referred to pulmonary clinic for hospital follow up for chronic hypoxemic respiratory failure due to pulmonary fibrosis.   CT Chest scan 02/2021 demonstrates UIP pattern prior to his major flare in disease April/May.   He has been stable since last visit on 2L of oxygen. We will order him a portable oxygen concentrator. His weight is stable at this time. We discussed potential treatment with antifibrotic therapy but he is too frail at this time.   We will get pulmonary function tests and refer him to pulmonary rehab to work on his conditioning.  We referred him to palliative care but they feel he is not a candidate at this time.  Follow up in 2 months.   Freda Jackson, MD [facility] Office: [phone]   Current Outpatient Medications:    acetaminophen (TYLENOL) 500 MG tablet, Take 1,000 mg by mouth every 6 (six) hours as needed for moderate pain., Disp: , Rfl:    aspirin EC 81 MG  tablet, Take 1 tablet (81 mg total) by mouth daily. Swallow whole., Disp: 90 tablet, Rfl: 3   brimonidine (ALPHAGAN) 0.2 % ophthalmic solution, Place 1 drop into the left eye 2 (two) times daily., Disp: , Rfl:    carvedilol (COREG) 3.125 MG tablet, Take 1 tablet (3.125 mg total) by mouth 2 (two) times daily with a meal., Disp: 60 tablet, Rfl: 11   cyanocobalamin 1000 MCG tablet, Take 1 tablet (1,000 mcg total) by mouth  daily., Disp: , Rfl:    dapagliflozin propanediol (FARXIGA) 10 MG TABS tablet, Take 1 tablet (10 mg total) by mouth daily., Disp: 30 tablet, Rfl: 11   dorzolamide-timolol (COSOPT) 22.3-6.8 MG/ML ophthalmic solution, Place 1 drop into both eyes 2 (two) times daily., Disp: , Rfl:    ezetimibe (ZETIA) 10 MG tablet, TAKE 1 TABLET(10 MG) BY MOUTH DAILY, Disp: 90 tablet, Rfl: 1   feeding supplement (ENSURE ENLIVE / ENSURE PLUS) LIQD, Take 237 mLs by mouth 2 (two) times daily between meals., Disp: 237 mL, Rfl: 12   latanoprost (XALATAN) 0.005 % ophthalmic solution, Place 1 drop into both eyes at bedtime. , Disp: , Rfl:    magic mouthwash (nystatin, lidocaine, diphenhydrAMINE) suspension, Take 5 mLs by mouth 3 (three) times daily as needed for mouth pain., Disp: 180 mL, Rfl: 1   nitroGLYCERIN (NITROSTAT) 0.4 MG SL tablet, Place 1 tablet (0.4 mg total) under the tongue every 5 (five) minutes as needed for chest pain., Disp: 25 tablet, Rfl: 3   pantoprazole (PROTONIX) 40 MG tablet, 40 mg BID x 30 days then once daily after, Disp: 60 tablet, Rfl: 1   SODIUM FLUORIDE 5000 PPM 1.1 % PSTE, Apply 1 application. topically in the morning and at bedtime., Disp: , Rfl:    spironolactone (ALDACTONE) 25 MG tablet, Take 0.5 tablets (12.5 mg total) by mouth daily., Disp: 30 tablet, Rfl: 2   triamcinolone cream (KENALOG) 0.1 %, Apply 1 application topically 2 (two) times daily as needed (eczema)., Disp: , Rfl:    furosemide (LASIX) 40 MG tablet, Take 1 tablet by mouth on Mondays, Wednesdays, and Fridays only (Patient not taking: Reported on 12/26/2021), Disp: 40 tablet, Rfl: 3

## 2021-12-26 NOTE — Patient Instructions (Signed)
Your CT Chest scan from 02/2021 does show underlying pulmonary fibrosis  We will refer you to pulmonary rehab  We will refer you to palliative care  We will schedule you for pulmonary function tests  We will order you a portable oxygen concentrator  Follow up in 2 months

## 2021-12-28 ENCOUNTER — Other Ambulatory Visit: Payer: Self-pay | Admitting: Internal Medicine

## 2021-12-31 DIAGNOSIS — E782 Mixed hyperlipidemia: Secondary | ICD-10-CM | POA: Diagnosis not present

## 2021-12-31 DIAGNOSIS — N400 Enlarged prostate without lower urinary tract symptoms: Secondary | ICD-10-CM | POA: Diagnosis not present

## 2021-12-31 DIAGNOSIS — I2510 Atherosclerotic heart disease of native coronary artery without angina pectoris: Secondary | ICD-10-CM | POA: Diagnosis not present

## 2021-12-31 DIAGNOSIS — H4089 Other specified glaucoma: Secondary | ICD-10-CM | POA: Diagnosis not present

## 2022-01-01 ENCOUNTER — Telehealth (HOSPITAL_COMMUNITY): Payer: Self-pay

## 2022-01-01 NOTE — Telephone Encounter (Signed)
Pt insurance is active and benefits verified through [employer]. Co-pay $15, DED 0/0 met, out of pocket $3,200/$3,200 met, co-insurance 0%. no pre-authorization required, Maida/HTA 01/01/2022_0 :29pm, REF# [id number]

## 2022-01-04 ENCOUNTER — Telehealth (HOSPITAL_COMMUNITY): Payer: Self-pay | Admitting: Family Medicine

## 2022-01-08 ENCOUNTER — Telehealth: Payer: Self-pay | Admitting: Pulmonary Disease

## 2022-01-08 DIAGNOSIS — R0602 Shortness of breath: Secondary | ICD-10-CM

## 2022-01-09 DIAGNOSIS — H401133 Primary open-angle glaucoma, bilateral, severe stage: Secondary | ICD-10-CM | POA: Diagnosis not present

## 2022-01-09 NOTE — Telephone Encounter (Signed)
Spoke to pt's wife about Palliative care and the POC for Roberto Ponce. Order to DME placed. Pt's wife verbalized understanding. Nothing further needed at this time.

## 2022-01-17 ENCOUNTER — Telehealth (HOSPITAL_COMMUNITY): Payer: Self-pay

## 2022-01-17 NOTE — Telephone Encounter (Signed)
Called patient to see if he was interested in participating in the Pulmonary Rehab Program. Patient stated yes. Patient will come in for orientation on 01/19/22'@10'$ :30am and will attend the 10:15am exercise class.

## 2022-01-18 ENCOUNTER — Telehealth (HOSPITAL_COMMUNITY): Payer: Self-pay | Admitting: *Deleted

## 2022-01-18 NOTE — Telephone Encounter (Signed)
Called Roberto Ponce to confirm his orientation appointment. He states that he will be coming. We discussed Covid symptoms and precautions, wearing mask, proper shoes,our address and location and our contact number. He voices understanding.

## 2022-01-19 ENCOUNTER — Encounter (HOSPITAL_COMMUNITY): Payer: Self-pay

## 2022-01-19 ENCOUNTER — Encounter (HOSPITAL_COMMUNITY)
Admission: RE | Admit: 2022-01-19 | Discharge: 2022-01-19 | Disposition: A | Discharge status: Home / Self Care | Payer: PPO | Source: Ambulatory Visit | Attending: Pulmonary Disease | Admitting: Pulmonary Disease

## 2022-01-19 VITALS — BP 116/60 | HR 83 | Ht 69.0 in | Wt 133.4 lb

## 2022-01-19 DIAGNOSIS — J8410 Pulmonary fibrosis, unspecified: Secondary | ICD-10-CM | POA: Diagnosis not present

## 2022-01-19 HISTORY — DX: Malignant neoplasm of unspecified testis, unspecified whether descended or undescended: C62.90

## 2022-01-19 NOTE — Progress Notes (Signed)
Roberto Ponce 71 y.o. male Pulmonary Rehab Orientation Note This patient who was referred to Pulmonary Rehab by Dr. Erin Fulling with the diagnosis of pulmonary fibrosis arrived today in Cardiac and Pulmonary Rehab. He arrived in wheelchair with unsteady gait. He does carry portable oxygen. [employer] is the provider for their DME. Per patient, Roberto Ponce uses oxygen continuously. Color good, skin warm and dry. Patient is oriented to time and place. Patient's medical history, psychosocial health, and medications reviewed. Psychosocial assessment reveals patient lives with spouse. Roberto Ponce is currently retired. Patient hobbies include  spending time with his grandchildren . Patient reports his stress level is low. Areas of stress/anxiety include health. Patient does exhibit signs of depression. Signs of depression include helplessness and hopelessness and fatigue. PHQ2/9 score 2/5. Roberto Ponce shows good  coping skills with negative outlook on life. Offered emotional support and reassurance. Will continue to monitor and evaluate progress toward psychosocial goal(s) of seeing a therapist or speaking to his PCP. Physical assessment reveals heart rate is normal, breath sounds diminished throughout all lung fields to auscultation, no wheezes, rales, or rhonchi. Grip strength equal, moderate. Distal pulses present. Roberto Ponce reports he  does take medications as prescribed. Patient states he  follows a regular  diet. The patient has been trying to gain weight by eating more frequent meals.. Pt's weight will be monitored closely. Demonstration and practice of PLB using pulse oximeter. Roberto Ponce able to return demonstration satisfactorily. Safety and hand hygiene in the exercise area reviewed with patient. Roberto Ponce voices understanding of the information reviewed. Department expectations discussed with patient and achievable goals were set. The patient shows enthusiasm about attending the program and we look forward to working with Roberto Ponce. Roberto Ponce  completed a 6 min walk test today and is scheduled to begin exercise on 12/7 at 10:15am.   [id number] [facility]

## 2022-01-19 NOTE — Progress Notes (Signed)
Pulmonary Individual Treatment Plan  Patient Details  Name: Roberto Ponce MRN: [id number] Date of Birth: 1950/10/28 Referring Provider:   [name] Pulmonary Rehab Walk Test from 01/19/2022 in [facility] for Heart, Vascular, & [place]  Referring Provider [name]       Initial Encounter Date:  Flowsheet Row Pulmonary Rehab Walk Test from 01/19/2022 in [facility] for Heart, Vascular, & [place]  Date 01/19/22       Visit Diagnosis: Pulmonary fibrosis ([place])  Patient's Home Medications on Admission:   Current Outpatient Medications:    acetaminophen (TYLENOL) 500 MG tablet, Take 1,000 mg by mouth every 6 (six) hours as needed for moderate pain., Disp: , Rfl:    aspirin EC 81 MG tablet, Take 1 tablet (81 mg total) by mouth daily. Swallow whole., Disp: 90 tablet, Rfl: 3   brimonidine (ALPHAGAN) 0.2 % ophthalmic solution, Place 1 drop into the left eye 2 (two) times daily., Disp: , Rfl:    carvedilol (COREG) 3.125 MG tablet, Take 1 tablet (3.125 mg total) by mouth 2 (two) times daily with a meal., Disp: 60 tablet, Rfl: 11   cyanocobalamin 1000 MCG tablet, Take 1 tablet (1,000 mcg total) by mouth daily., Disp: , Rfl:    dapagliflozin propanediol (FARXIGA) 10 MG TABS tablet, Take 1 tablet (10 mg total) by mouth daily., Disp: 30 tablet, Rfl: 11   dorzolamide-timolol (COSOPT) 22.3-6.8 MG/ML ophthalmic solution, Place 1 drop into both eyes 2 (two) times daily., Disp: , Rfl:    ezetimibe (ZETIA) 10 MG tablet, TAKE 1 TABLET(10 MG) BY MOUTH DAILY, Disp: 90 tablet, Rfl: 1   feeding supplement (ENSURE ENLIVE / ENSURE PLUS) LIQD, Take 237 mLs by mouth 2 (two) times daily between meals., Disp: 237 mL, Rfl: 12   furosemide (LASIX) 40 MG tablet, Take 1 tablet by mouth on Mondays, Wednesdays, and Fridays only (Patient not taking: Reported on 12/26/2021), Disp: 40 tablet, Rfl: 3   latanoprost (XALATAN) 0.005 % ophthalmic solution, Place 1 drop  into both eyes at bedtime. , Disp: , Rfl:    magic mouthwash (nystatin, lidocaine, diphenhydrAMINE) suspension, Take 5 mLs by mouth 3 (three) times daily as needed for mouth pain., Disp: 180 mL, Rfl: 1   nitroGLYCERIN (NITROSTAT) 0.4 MG SL tablet, Place 1 tablet (0.4 mg total) under the tongue every 5 (five) minutes as needed for chest pain., Disp: 25 tablet, Rfl: 3   pantoprazole (PROTONIX) 40 MG tablet, 40 mg BID x 30 days then once daily after, Disp: 60 tablet, Rfl: 1   SODIUM FLUORIDE 5000 PPM 1.1 % PSTE, Apply 1 application. topically in the morning and at bedtime., Disp: , Rfl:    spironolactone (ALDACTONE) 25 MG tablet, TAKE 1/2 TABLET(12.5 MG) BY MOUTH DAILY, Disp: 45 tablet, Rfl: 3   triamcinolone cream (KENALOG) 0.1 %, Apply 1 application topically 2 (two) times daily as needed (eczema)., Disp: , Rfl:   Past Medical History: Past Medical History:  Diagnosis Date   Asthma    BOOP (bronchiolitis obliterans with organizing pneumonia) ([place])    Chronic respiratory failure ([place])    Coronary artery disease    DVT (deep venous thrombosis) ([facility])    Glaucoma    Heart failure with mid-range ejection fraction ([place])    History of gallstones    Hyperlipidemia    Hyponatremia    Hypotension due to medication 06/22/2021   LVH (left ventricular hypertrophy)    MI (myocardial infarction) ([place])  Mitral regurgitation    Mixed dyslipidemia    Pneumomediastinum (H[facility])    Protein calorie malnutrition ([place])    Shock ([place]) 06/22/2021   Testicular cancer (HCC)    Venous stasis     Tobacco Use: Social History   Tobacco Use  Smoking Status Never  Smokeless Tobacco Never    Labs: Review Flowsheet  More data exists      Latest Ref Rng & Units 06/16/2020 02/14/2021 06/20/2021 06/21/2021 07/04/2021  Labs for ITP Cardiac and Pulmonary Rehab  Cholestrol 100 - 199 mg/dL 136  - - - -  LDL (calc) 0 - 99 mg/dL 86  - - - -  HDL-C >39 mg/dL 34  - - - -  Trlycerides <150 mg/dL 82  - - 86  -   Hemoglobin A1c 4.8 - 5.6 % - - 5.4  - -  PH, Arterial 7.35 - 7.45 - - 7.46  7.49  7.41  7.42  7.46   PCO2 arterial 32 - 48 mmHg - - 34  30  38  38  38   Bicarbonate 20.0 - 28.0 mmol/L - 26.5  26.0  24.3  22.9  23.9  24.6  27.0   TCO2 22 - 32 mmol/L 22 - 32 mmol/L - 28  27  - - -  Acid-base deficit 0.0 - 2.0 mmol/L - 1.0  1.0  0.1  - -  O2 Saturation % - 99.0  79.0  100  99.3  97.4  99.5  97     Capillary Blood Glucose: Lab Results  Component Value Date   GLUCAP 117 (H) 06/28/2021   GLUCAP 111 (H) 06/28/2021   GLUCAP 137 (H) 06/27/2021   GLUCAP 95 06/27/2021   GLUCAP 97 06/27/2021     Pulmonary Assessment Scores:  Pulmonary Assessment Scores     Row Name 01/19/22 1144         ADL UCSD   ADL Phase Entry     SOB Score total 93       CAT Score   CAT Score 21       mMRC Score   mMRC Score 3             UCSD: Self-administered rating of dyspnea associated with activities of daily living (ADLs) 6-point scale (0 = "not at all" to 5 = "maximal or unable to do because of breathlessness")  Scoring Scores range from 0 to 120.  Minimally important difference is 5 units  CAT: CAT can identify the health impairment of COPD patients and is better correlated with disease progression.  CAT has a scoring range of zero to 40. The CAT score is classified into four groups of low (less than 10), medium (10 - 20), high (21-30) and very high (31-40) based on the impact level of disease on health status. A CAT score over 10 suggests significant symptoms.  A worsening CAT score could be explained by an exacerbation, poor medication adherence, poor inhaler technique, or progression of COPD or comorbid conditions.  CAT MCID is 2 points  mMRC: mMRC (Modified Medical Research Council) Dyspnea Scale is used to assess the degree of baseline functional disability in patients of respiratory disease due to dyspnea. No minimal important difference is established. A decrease in score of 1 point or  greater is considered a positive change.   Pulmonary Function Assessment:  Pulmonary Function Assessment - 01/19/22 1140       Breath   Bilateral Breath Sounds Decreased    Shortness  of Breath Yes;Limiting activity             Exercise Target Goals: Exercise Program Goal: Individual exercise prescription set using results from initial 6 min walk test and THRR while considering  patient's activity barriers and safety.   Exercise Prescription Goal: Initial exercise prescription builds to 30-45 minutes a day of aerobic activity, 2-3 days per week.  Home exercise guidelines will be given to patient during program as part of exercise prescription that the participant will acknowledge.  Activity Barriers & Risk Stratification:  Activity Barriers & Cardiac Risk Stratification - 01/19/22 1117       Activity Barriers & Cardiac Risk Stratification   Activity Barriers Muscular Weakness;Deconditioning;History of Falls;Balance Concerns             6 Minute Walk:  6 Minute Walk     Row Name 01/19/22 1456         6 Minute Walk   Phase Initial     Distance 545 feet     Walk Time 6 minutes     # of Rest Breaks 1  2:38-3:20     MPH 1.03     METS 2.19     RPE 13     Perceived Dyspnea  2     VO2 Peak 7.67     Symptoms No     Resting HR 83 bpm     Resting BP 116/60     Resting Oxygen Saturation  100 %     Exercise Oxygen Saturation  during 6 min walk 90 %     Max Ex. HR 104 bpm     Max Ex. BP 138/74     2 Minute Post BP 128/60       Interval HR   1 Minute HR 103     2 Minute HR 103     3 Minute HR 98     4 Minute HR 99     5 Minute HR 104     6 Minute HR 91     2 Minute Post HR 90     Interval Heart Rate? Yes       Interval Oxygen   Interval Oxygen? Yes     Baseline Oxygen Saturation % 95 %     1 Minute Oxygen Saturation % 92 %     1 Minute Liters of Oxygen 2 L     2 Minute Oxygen Saturation % 92 %     2 Minute Liters of Oxygen 2 L     3 Minute Oxygen  Saturation % 93 %     3 Minute Liters of Oxygen 2 L     4 Minute Oxygen Saturation % 93 %     4 Minute Liters of Oxygen 2 L     5 Minute Oxygen Saturation % 92 %     5 Minute Liters of Oxygen 2 L     6 Minute Oxygen Saturation % 90 %     6 Minute Liters of Oxygen 2 L     2 Minute Post Oxygen Saturation % 98 %     2 Minute Post Liters of Oxygen 2 L              Oxygen Initial Assessment:  Oxygen Initial Assessment - 01/19/22 1118       Home Oxygen   Home Oxygen Device Home Concentrator;Portable Concentrator    Sleep Oxygen Prescription Continuous    Liters per minute 2.5  Home Exercise Oxygen Prescription None    Home Resting Oxygen Prescription Continuous    Liters per minute 2.5    Compliance with Home Oxygen Use Yes      Initial 6 min Walk   Oxygen Used Continuous    Liters per minute 3      Program Oxygen Prescription   Program Oxygen Prescription Continuous    Liters per minute 3      Intervention   Short Term Goals To learn and exhibit compliance with exercise, home and travel O2 prescription;To learn and understand importance of maintaining oxygen saturations>88%;To learn and understand importance of monitoring SPO2 with pulse oximeter and demonstrate accurate use of the pulse oximeter.;To learn and demonstrate proper pursed lip breathing techniques or other breathing techniques.     Long  Term Goals Exhibits compliance with exercise, home  and travel O2 prescription;Maintenance of O2 saturations>88%;Exhibits proper breathing techniques, such as pursed lip breathing or other method taught during program session;Verbalizes importance of monitoring SPO2 with pulse oximeter and return demonstration             Oxygen Re-Evaluation:   Oxygen Discharge (Final Oxygen Re-Evaluation):   Initial Exercise Prescription:  Initial Exercise Prescription - 01/19/22 1400       Date of Initial Exercise RX and Referring Provider   Date 01/19/22    Referring Provider  [name]    Expected Discharge Date 03/29/22      Oxygen   Oxygen Continuous    Liters 2    Maintain Oxygen Saturation 88% or higher      NuStep   Level 1    SPM 50    Minutes 15      Prescription Details   Frequency (times per week) 2    Duration Progress to 30 minutes of continuous aerobic without signs/symptoms of physical distress      Intensity   THRR 40-80% of Max Heartrate 60-120    Ratings of Perceived Exertion 11-13    Perceived Dyspnea 0-4      Progression   Progression Continue progressive overload as per policy without signs/symptoms or physical distress.      Resistance Training   Training Prescription Yes    Weight yellow bands    Reps 10-15             Perform Capillary Blood Glucose checks as needed.  Exercise Prescription Changes:   Exercise Comments:   Exercise Goals and Review:   Exercise Goals     Row Name 01/19/22 1500             Exercise Goals   Increase Physical Activity Yes       Intervention Provide advice, education, support and counseling about physical activity/exercise needs.;Develop an individualized exercise prescription for aerobic and resistive training based on initial evaluation findings, risk stratification, comorbidities and participant's personal goals.       Expected Outcomes Short Term: Attend rehab on a regular basis to increase amount of physical activity.;Long Term: Exercising regularly at least 3-5 days a week.;Long Term: Add in home exercise to make exercise part of routine and to increase amount of physical activity.       Increase Strength and Stamina Yes       Intervention Provide advice, education, support and counseling about physical activity/exercise needs.;Develop an individualized exercise prescription for aerobic and resistive training based on initial evaluation findings, risk stratification, comorbidities and participant's personal goals.       Expected Outcomes Short Term: Increase  workloads from  initial exercise prescription for resistance, speed, and METs.;Short Term: Perform resistance training exercises routinely during rehab and add in resistance training at home;Long Term: Improve cardiorespiratory fitness, muscular endurance and strength as measured by increased METs and functional capacity (6MWT)       Able to understand and use rate of perceived exertion (RPE) scale Yes       Intervention Provide education and explanation on how to use RPE scale       Expected Outcomes Short Term: Able to use RPE daily in rehab to express subjective intensity level;Long Term:  Able to use RPE to guide intensity level when exercising independently       Able to understand and use Dyspnea scale Yes       Intervention Provide education and explanation on how to use Dyspnea scale       Expected Outcomes Short Term: Able to use Dyspnea scale daily in rehab to express subjective sense of shortness of breath during exertion;Long Term: Able to use Dyspnea scale to guide intensity level when exercising independently       Knowledge and understanding of Target Heart Rate Range (THRR) Yes       Intervention Provide education and explanation of THRR including how the numbers were predicted and where they are located for reference       Expected Outcomes Short Term: Able to state/look up THRR;Long Term: Able to use THRR to govern intensity when exercising independently;Short Term: Able to use daily as guideline for intensity in rehab       Understanding of Exercise Prescription Yes       Intervention Provide education, explanation, and written materials on patient's individual exercise prescription       Expected Outcomes Short Term: Able to explain program exercise prescription;Long Term: Able to explain home exercise prescription to exercise independently                Exercise Goals Re-Evaluation :   Discharge Exercise Prescription (Final Exercise Prescription Changes):   Nutrition:  Target Goals:  Understanding of nutrition guidelines, daily intake of sodium <1543m, cholesterol <2035m calories 30% from fat and 7% or less from saturated fats, daily to have 5 or more servings of fruits and vegetables.  Biometrics:  Pre Biometrics - 01/19/22 1102       Pre Biometrics   Grip Strength 16 kg              Nutrition Therapy Plan and Nutrition Goals:   Nutrition Assessments:  MEDIFICTS Score Key: ?70 Need to make dietary changes  40-70 Heart Healthy Diet ? 40 Therapeutic Level Cholesterol Diet   Picture Your Plate Scores: <4<97nhealthy dietary pattern with much room for improvement. 41-50 Dietary pattern unlikely to meet recommendations for good health and room for improvement. 51-60 More healthful dietary pattern, with some room for improvement.  >60 Healthy dietary pattern, although there may be some specific behaviors that could be improved.    Nutrition Goals Re-Evaluation:   Nutrition Goals Discharge (Final Nutrition Goals Re-Evaluation):   Psychosocial: Target Goals: Acknowledge presence or absence of significant depression and/or stress, maximize coping skills, provide positive support system. Participant is able to verbalize types and ability to use techniques and skills needed for reducing stress and depression.  Initial Review & Psychosocial Screening:  Initial Psych Review & Screening - 01/19/22 1133       Initial Review   Current issues with Current Depression      Family Dynamics  Good Support System? Yes    Comments Pt has a very support wife      Barriers   Psychosocial barriers to participate in program The patient should benefit from training in stress management and relaxation.;Psychosocial barriers identified (see note)      Screening Interventions   Interventions Encouraged to exercise;To provide support and resources with identified psychosocial needs;Provide feedback about the scores to participant    Expected Outcomes Short Term goal:  Utilizing psychosocial counselor, staff and physician to assist with identification of specific Stressors or current issues interfering with healing process. Setting desired goal for each stressor or current issue identified.;Short Term goal: Identification and review with participant of any Quality of Life or Depression concerns found by scoring the questionnaire.;Long Term goal: The participant improves quality of Life and PHQ9 Scores as seen by post scores and/or verbalization of changes;Long Term Goal: Stressors or current issues are controlled or eliminated.             Quality of Life Scores:  Scores of 19 and below usually indicate a poorer quality of life in these areas.  A difference of  2-3 points is a clinically meaningful difference.  A difference of 2-3 points in the total score of the Quality of Life Index has been associated with significant improvement in overall quality of life, self-image, physical symptoms, and general health in studies assessing change in quality of life.  PHQ-9: Review Flowsheet       01/19/2022 08/31/2016  Depression screen PHQ 2/9  Decreased Interest 1 0  Down, Depressed, Hopeless 1 0  PHQ - 2 Score 2 0  Altered sleeping 0 -  Tired, decreased energy 0 -  Change in appetite 2 -  Feeling bad or failure about yourself  1 -  Trouble concentrating 0 -  Moving slowly or fidgety/restless 0 -  Suicidal thoughts 0 -  PHQ-9 Score 5 -  Difficult doing work/chores Somewhat difficult -   Interpretation of Total Score  Total Score Depression Severity:  1-4 = Minimal depression, 5-9 = Mild depression, 10-14 = Moderate depression, 15-19 = Moderately severe depression, 20-27 = Severe depression   Psychosocial Evaluation and Intervention:  Psychosocial Evaluation - 01/19/22 1137       Psychosocial Evaluation & Interventions   Interventions Relaxation education;Stress management education;Encouraged to exercise with the program and follow exercise  prescription    Comments Pt stated he may have some anxiety and depression, will provide resources to patient for a therapist    Expected Outcomes Gaige will participate in rehab free of pysosocial barriers    Continue Psychosocial Services  Follow up required by staff             Psychosocial Re-Evaluation:   Psychosocial Discharge (Final Psychosocial Re-Evaluation):   Education: Education Goals: Education classes will be provided on a weekly basis, covering required topics. Participant will state understanding/return demonstration of topics presented.  Learning Barriers/Preferences:  Learning Barriers/Preferences - 01/19/22 1117       Learning Barriers/Preferences   Learning Barriers Sight    Learning Preferences Audio;Skilled Demonstration;Verbal Instruction;Video             Education Topics: Introduction to Pulmonary Rehab Group instruction provided by PowerPoint, verbal discussion, and written material to support subject matter. Instructor reviews what Pulmonary Rehab is, the purpose of the program, and how patients are referred.     Know Your Numbers Group instruction that is supported by a PowerPoint presentation. Instructor discusses importance of knowing and  understanding resting, exercise, and post-exercise oxygen saturation, heart rate, and blood pressure. Oxygen saturation, heart rate, blood pressure, rating of perceived exertion, and dyspnea are reviewed along with a normal range for these values.    Exercise for the Pulmonary Patient Group instruction that is supported by a PowerPoint presentation. Instructor discusses benefits of exercise, core components of exercise, frequency, duration, and intensity of an exercise routine, importance of utilizing pulse oximetry during exercise, safety while exercising, and options of places to exercise outside of rehab.       MET Level  Group instruction provided by PowerPoint, verbal discussion, and written  material to support subject matter. Instructor reviews what METs are and how to increase METs.    Pulmonary Medications Verbally interactive group education provided by instructor with focus on inhaled medications and proper administration.   Anatomy and Physiology of the Respiratory System Group instruction provided by PowerPoint, verbal discussion, and written material to support subject matter. Instructor reviews respiratory cycle and anatomical components of the respiratory system and their functions. Instructor also reviews differences in obstructive and restrictive respiratory diseases with examples of each.    Oxygen Safety Group instruction provided by PowerPoint, verbal discussion, and written material to support subject matter. There is an overview of "What is Oxygen" and "Why do we need it".  Instructor also reviews how to create a safe environment for oxygen use, the importance of using oxygen as prescribed, and the risks of noncompliance. There is a brief discussion on traveling with oxygen and resources the patient may utilize.   Oxygen Use Group instruction provided by PowerPoint, verbal discussion, and written material to discuss how supplemental oxygen is prescribed and different types of oxygen supply systems. Resources for more information are provided.    Breathing Techniques Group instruction that is supported by demonstration and informational handouts. Instructor discusses the benefits of pursed lip and diaphragmatic breathing and detailed demonstration on how to perform both.     Risk Factor Reduction Group instruction that is supported by a PowerPoint presentation. Instructor discusses the definition of a risk factor, different risk factors for pulmonary disease, and how the heart and lungs work together.   MD Day A group question and answer session with a medical doctor that allows participants to ask questions that relate to their pulmonary disease  state.   Nutrition for the Pulmonary Patient Group instruction provided by PowerPoint slides, verbal discussion, and written materials to support subject matter. The instructor gives an explanation and review of healthy diet recommendations, which includes a discussion on weight management, recommendations for fruit and vegetable consumption, as well as protein, fluid, caffeine, fiber, sodium, sugar, and alcohol. Tips for eating when patients are short of breath are discussed.    Other Education Group or individual verbal, written, or video instructions that support the educational goals of the pulmonary rehab program.    Knowledge Questionnaire Score:  Knowledge Questionnaire Score - 01/19/22 1145       Knowledge Questionnaire Score   Pre Score 15/18             Core Components/Risk Factors/Patient Goals at Admission:  Personal Goals and Risk Factors at Admission - 01/19/22 1138       Core Components/Risk Factors/Patient Goals on Admission    Weight Management Yes;Weight Gain    Intervention Weight Management: Develop a combined nutrition and exercise program designed to reach desired caloric intake, while maintaining appropriate intake of nutrient and fiber, sodium and fats, and appropriate energy expenditure required for  the weight goal.;Weight Management: Provide education and appropriate resources to help participant work on and attain dietary goals.    Expected Outcomes Short Term: Continue to assess and modify interventions until short term weight is achieved;Long Term: Adherence to nutrition and physical activity/exercise program aimed toward attainment of established weight goal;Weight Gain: Understanding of general recommendations for a high calorie, high protein meal plan that promotes weight gain by distributing calorie intake throughout the day with the consumption for 4-5 meals, snacks, and/or supplements    Improve shortness of breath with ADL's Yes    Intervention  Provide education, individualized exercise plan and daily activity instruction to help decrease symptoms of SOB with activities of daily living.    Expected Outcomes Long Term: Be able to perform more ADLs without symptoms or delay the onset of symptoms;Short Term: Improve cardiorespiratory fitness to achieve a reduction of symptoms when performing ADLs             Core Components/Risk Factors/Patient Goals Review:    Core Components/Risk Factors/Patient Goals at Discharge (Final Review):    ITP Comments:   Comments: Dr. Rodman Pickle is Medical Director for Pulmonary Rehab at [facility].

## 2022-01-25 ENCOUNTER — Encounter (HOSPITAL_COMMUNITY)
Admission: RE | Admit: 2022-01-25 | Discharge: 2022-01-25 | Disposition: A | Discharge status: Home / Self Care | Payer: PPO | Source: Ambulatory Visit | Attending: Pulmonary Disease | Admitting: Pulmonary Disease

## 2022-01-25 DIAGNOSIS — J8410 Pulmonary fibrosis, unspecified: Secondary | ICD-10-CM

## 2022-01-25 NOTE — Progress Notes (Signed)
Daily Session Note  Patient Details  Name: Roberto Ponce MRN: [id number] Date of Birth: 27-Jun-1950 Referring Provider:   [name] Pulmonary Rehab Walk Test from 01/19/2022 in [facility] for Heart, Vascular, & [place]  Referring Provider [name]       Encounter Date: 01/25/2022  Check In:  Session Check In - 01/25/22 1141       Check-In   Supervising physician immediately available to respond to emergencies [facility] - Physician supervision    Physician(s) [place]    Location [facility]    Staff Present Elmon Else, MS, ACSM-CEP, Exercise Physiologist;Other;Samantha [place], RD, LDN    Virtual Visit No    Medication changes reported     No    Fall or balance concerns reported    Yes    Tobacco Cessation No Change    Warm-up and Cool-down Not performed (comment)    Resistance Training Performed Yes    VAD Patient? No    PAD/SET Patient? No      Pain Assessment   Currently in Pain? No/denies    Multiple Pain Sites No             Capillary Blood Glucose: No results found for this or any previous visit (from the past 24 hour(s)).    Social History   Tobacco Use  Smoking Status Never  Smokeless Tobacco Never    Goals Met:  Exercise tolerated well No report of concerns or symptoms today Strength training completed today  Goals Unmet:  Not Applicable  Comments: First day of exercise. Service time is from 1023 to 1150.    Dr. Rodman Pickle is Medical Director for Pulmonary Rehab at [facility].

## 2022-01-30 ENCOUNTER — Encounter (HOSPITAL_COMMUNITY)
Admission: RE | Admit: 2022-01-30 | Discharge: 2022-01-30 | Disposition: A | Discharge status: Home / Self Care | Payer: PPO | Source: Ambulatory Visit | Attending: Pulmonary Disease | Admitting: Pulmonary Disease

## 2022-01-30 DIAGNOSIS — J8410 Pulmonary fibrosis, unspecified: Secondary | ICD-10-CM

## 2022-01-30 NOTE — Progress Notes (Signed)
Daily Session Note  Patient Details  Name: Roberto Ponce MRN: [id number] Date of Birth: 06/16/1950 Referring Provider:   [name] Pulmonary Rehab Walk Test from 01/19/2022 in [facility] for Heart, Vascular, & [place]  Referring Provider [name]       Encounter Date: 01/30/2022  Check In:  Session Check In - 01/30/22 1137       Check-In   Supervising physician immediately available to respond to emergencies [facility] - Physician supervision    Physician(s) Dr. Tacy Learn    Location [facility]    Staff Present Elmon Else, MS, ACSM-CEP, Exercise Physiologist;Lisa Ysidro Evert, RN;Randi Olen Cordial BS, ACSM-CEP, Exercise Physiologist;Other    Virtual Visit No    Medication changes reported     No    Fall or balance concerns reported    No    Tobacco Cessation No Change    Warm-up and Cool-down Performed as group-led instruction    Resistance Training Performed Yes    VAD Patient? No    PAD/SET Patient? No      Pain Assessment   Currently in Pain? No/denies    Multiple Pain Sites No             Capillary Blood Glucose: No results found for this or any previous visit (from the past 24 hour(s)).    Social History   Tobacco Use  Smoking Status Never  Smokeless Tobacco Never    Goals Met:  Proper associated with RPD/PD & O2 Sat Exercise tolerated well No report of concerns or symptoms today Strength training completed today  Goals Unmet:  Not Applicable  Comments: Service time is from 1028 to 1150.    Dr. Rodman Pickle is Medical Director for Pulmonary Rehab at [facility].

## 2022-02-01 ENCOUNTER — Emergency Department (HOSPITAL_COMMUNITY)
Admission: EM | Admit: 2022-02-01 | Discharge: 2022-02-01 | Disposition: A | Discharge status: Home / Self Care | Payer: PPO | Attending: Emergency Medicine | Admitting: Emergency Medicine

## 2022-02-01 ENCOUNTER — Encounter (HOSPITAL_COMMUNITY)
Admission: RE | Admit: 2022-02-01 | Discharge: 2022-02-01 | Disposition: A | Discharge status: Home / Self Care | Payer: PPO | Source: Ambulatory Visit | Attending: Pulmonary Disease | Admitting: Pulmonary Disease

## 2022-02-01 ENCOUNTER — Ambulatory Visit: Payer: PPO | Attending: Internal Medicine | Admitting: Internal Medicine

## 2022-02-01 ENCOUNTER — Emergency Department (HOSPITAL_COMMUNITY): Payer: PPO

## 2022-02-01 ENCOUNTER — Encounter: Payer: Self-pay | Admitting: Internal Medicine

## 2022-02-01 VITALS — Wt 145.7 lb

## 2022-02-01 VITALS — BP 110/58 | HR 78 | Ht 69.0 in | Wt 134.0 lb

## 2022-02-01 DIAGNOSIS — R29818 Other symptoms and signs involving the nervous system: Secondary | ICD-10-CM | POA: Diagnosis not present

## 2022-02-01 DIAGNOSIS — G319 Degenerative disease of nervous system, unspecified: Secondary | ICD-10-CM | POA: Diagnosis not present

## 2022-02-01 DIAGNOSIS — S0003XA Contusion of scalp, initial encounter: Secondary | ICD-10-CM

## 2022-02-01 DIAGNOSIS — Z7982 Long term (current) use of aspirin: Secondary | ICD-10-CM | POA: Insufficient documentation

## 2022-02-01 DIAGNOSIS — I340 Nonrheumatic mitral (valve) insufficiency: Secondary | ICD-10-CM | POA: Diagnosis not present

## 2022-02-01 DIAGNOSIS — S0083XA Contusion of other part of head, initial encounter: Secondary | ICD-10-CM | POA: Insufficient documentation

## 2022-02-01 DIAGNOSIS — R29810 Facial weakness: Secondary | ICD-10-CM | POA: Diagnosis not present

## 2022-02-01 DIAGNOSIS — W19XXXA Unspecified fall, initial encounter: Secondary | ICD-10-CM

## 2022-02-01 DIAGNOSIS — Z951 Presence of aortocoronary bypass graft: Secondary | ICD-10-CM | POA: Diagnosis not present

## 2022-02-01 DIAGNOSIS — I639 Cerebral infarction, unspecified: Secondary | ICD-10-CM | POA: Diagnosis not present

## 2022-02-01 DIAGNOSIS — W06XXXA Fall from bed, initial encounter: Secondary | ICD-10-CM | POA: Diagnosis not present

## 2022-02-01 DIAGNOSIS — I2510 Atherosclerotic heart disease of native coronary artery without angina pectoris: Secondary | ICD-10-CM | POA: Diagnosis not present

## 2022-02-01 DIAGNOSIS — Z79899 Other long term (current) drug therapy: Secondary | ICD-10-CM | POA: Insufficient documentation

## 2022-02-01 DIAGNOSIS — S0990XA Unspecified injury of head, initial encounter: Secondary | ICD-10-CM | POA: Diagnosis present

## 2022-02-01 DIAGNOSIS — R531 Weakness: Secondary | ICD-10-CM | POA: Diagnosis not present

## 2022-02-01 DIAGNOSIS — I509 Heart failure, unspecified: Secondary | ICD-10-CM | POA: Insufficient documentation

## 2022-02-01 DIAGNOSIS — J8410 Pulmonary fibrosis, unspecified: Secondary | ICD-10-CM

## 2022-02-01 DIAGNOSIS — I6782 Cerebral ischemia: Secondary | ICD-10-CM | POA: Diagnosis not present

## 2022-02-01 DIAGNOSIS — S0081XA Abrasion of other part of head, initial encounter: Secondary | ICD-10-CM | POA: Diagnosis not present

## 2022-02-01 DIAGNOSIS — E871 Hypo-osmolality and hyponatremia: Secondary | ICD-10-CM | POA: Diagnosis not present

## 2022-02-01 DIAGNOSIS — J320 Chronic maxillary sinusitis: Secondary | ICD-10-CM | POA: Diagnosis not present

## 2022-02-01 LAB — DIFFERENTIAL
Abs Immature Granulocytes: 0.01 10*3/uL (ref 0.00–0.07)
Basophils Absolute: 0 10*3/uL (ref 0.0–0.1)
Basophils Relative: 1 %
Eosinophils Absolute: 0.3 10*3/uL (ref 0.0–0.5)
Eosinophils Relative: 6 %
Immature Granulocytes: 0 %
Lymphocytes Relative: 26 %
Lymphs Abs: 1.2 10*3/uL (ref 0.7–4.0)
Monocytes Absolute: 0.6 10*3/uL (ref 0.1–1.0)
Monocytes Relative: 13 %
Neutro Abs: 2.6 10*3/uL (ref 1.7–7.7)
Neutrophils Relative %: 54 %

## 2022-02-01 LAB — COMPREHENSIVE METABOLIC PANEL
ALT: 14 U/L (ref 0–44)
AST: 23 U/L (ref 15–41)
Albumin: 3.4 g/dL — ABNORMAL LOW (ref 3.5–5.0)
Alkaline Phosphatase: 70 U/L (ref 38–126)
Anion gap: 12 (ref 5–15)
BUN: 10 mg/dL (ref 8–23)
CO2: 33 mmol/L — ABNORMAL HIGH (ref 22–32)
Calcium: 9 mg/dL (ref 8.9–10.3)
Chloride: 76 mmol/L — ABNORMAL LOW (ref 98–111)
Creatinine, Ser: 0.54 mg/dL — ABNORMAL LOW (ref 0.61–1.24)
GFR, Estimated: >60 mL/min (ref >60)
Glucose, Bld: 96 mg/dL (ref 70–99)
Potassium: 5.5 mmol/L — ABNORMAL HIGH (ref 3.5–5.1)
Sodium: 121 mmol/L — ABNORMAL LOW (ref 135–145)
Total Bilirubin: 0.1 mg/dL — ABNORMAL LOW (ref 0.3–1.2)
Total Protein: 7.2 g/dL (ref 6.5–8.1)

## 2022-02-01 LAB — ETHANOL: Alcohol, Ethyl (B): <10 mg/dL (ref <10)

## 2022-02-01 LAB — CBC
HCT: 39.7 % (ref 39.0–52.0)
Hemoglobin: 13.2 g/dL (ref 13.0–17.0)
MCH: 26.4 pg (ref 26.0–34.0)
MCHC: 33.2 g/dL (ref 30.0–36.0)
MCV: 79.4 fL — ABNORMAL LOW (ref 80.0–100.0)
Platelets: 226 10*3/uL (ref 150–400)
RBC: 5 MIL/uL (ref 4.22–5.81)
RDW: 16.6 % — ABNORMAL HIGH (ref 11.5–15.5)
WBC: 4.7 10*3/uL (ref 4.0–10.5)
nRBC: 0 % (ref 0.0–0.2)

## 2022-02-01 LAB — PROTIME-INR
INR: 1 (ref 0.8–1.2)
Prothrombin Time: 13.5 seconds (ref 11.4–15.2)

## 2022-02-01 LAB — APTT: aPTT: 36 seconds (ref 24–36)

## 2022-02-01 MED ORDER — SODIUM CHLORIDE 0.9% FLUSH
3.0000 mL | Freq: Once | INTRAVENOUS | Status: AC
  Administered 2022-02-01: 3 mL via INTRAVENOUS

## 2022-02-01 NOTE — ED Provider Triage Note (Signed)
Emergency Medicine Provider Triage Evaluation Note  Roberto Ponce , a 71 y.o. male  was evaluated in triage.  Pt complains of fall on Monday. Fall was mechanical when he misjudged the side of the bed. Hematoma to R frontal region. Went to his cardiologist today who was concerned about new L sided facial droop. Sent to ED for evaluation. Unknown LKW. Patient denies noticing any facial drooping as well as slurred speech, new or worsening extremity numbness or paresthesias, extremity weakness. On chronic O2. Not chronically anticoagulated.  Review of Systems  Positive: As above Negative: As above  Physical Exam  BP 125/76   Pulse 87   Temp 98.4 F (36.9 C)   Resp 15   SpO2 100%  Gen:   Awake, no distress   Resp:  Normal effort  MSK:   Moves extremities without difficulty  Other:  GCS 15. Speech is goal oriented. No deficits appreciated to CN III-XII; symmetric eyebrow raise, tongue midline. No facial droop noted in triage. Patient has equal grip strength bilaterally with symmetric strength against resistance in all major muscle groups bilaterally. Sensation to light touch intact. No pronator drift. Gait not assessed in triage.   Medical Decision Making  Medically screening exam initiated at 5:34 PM.  Appropriate orders placed.  Roberto Ponce was informed that the remainder of the evaluation will be completed by another provider, this initial triage assessment does not replace that evaluation, and the importance of remaining in the ED until their evaluation is complete.  Head trauma w/concern for neurologic deficit during outpatient evaluation - No obvious focal deficit in triage. Labs and imaging ordered.   Antonietta Breach, PA-C 02/01/22 1739

## 2022-02-01 NOTE — Discharge Instructions (Addendum)
CAT scan today was normal without evidence of bleeding.  All the lab work looked good except your sodium levels are low again.  It is okay to eat eat some foods with sodium and avoid drinking excessive amounts of water.  Follow-up with your doctor to have this rechecked.  If between the time now and when you see your doctor you start having weakness, confusion or other concerns return to the emergency room.  Currently continue all your medications as you are taking them now.

## 2022-02-01 NOTE — Progress Notes (Addendum)
Cardiology Office Note:    Date:  02/01/2022   ID:  AARUSH STUKEY, DOB [birth year], MRN [id number]  PCP:  Kristen Loader, [place] Providers Cardiologist:  Werner Lean, MD     Referring MD: Kristen Loader, FNP   CC:  SOB Follow up  History of Present Illness:    Roberto Ponce is a 71 y.o. male with a hx of CAD (prior MI with LIMA and RIMA in 1988), HLD, , MVP with Mod-Severe MR (not felt to be a candidate for mTEER, saw Dr. 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  Has had PE in the past.  Has had protein calorie malnutrition.  Has had new anemia.  Has had chronic SIAD. Much of his decompensation has been in 2023 after (but not because of) 12/22 DES to LCX.  2023:  At last visit, planned for colonscopy, anemia has improved on labs prior to evaluation.GDMT tolerated was coreg, SGLT2i and MRA.  Low dose Zetia (statin stopped because of unclear tranaminitis).  Had improved late August with steroids. Has prior PE (provoked from hospitalization).  He is off AC because of GI bleed.  We had very long conversation about IPF and long term outcomes.  He has done well with pulmonary.  This AM patient hit is head on Monday morning.  Had a notable knot on his head. Feel and hit his head. Had started Pulmonary rehab.   He is doing well enough with this he has need not diuretics. Has done so well he didn't need PC.   He is conversant.  He has been with his wife all day.   He is right handed.  Has R hand contracture. He has had new left sided facial droop.   Past Medical History:  Diagnosis Date   Asthma    BOOP (bronchiolitis obliterans with organizing pneumonia) ([place])    Chronic respiratory failure ([place])    Coronary artery disease    DVT (deep venous thrombosis) (HCC)    Glaucoma    Heart failure with mid-range ejection fraction (HCC)    History of gallstones    Hyperlipidemia    Hyponatremia     Hypotension due to medication 06/22/2021   LVH (left ventricular hypertrophy)    MI (myocardial infarction) ([place])    Mitral regurgitation    Mixed dyslipidemia    Pneumomediastinum (HCC)    Protein calorie malnutrition ([place])    Shock ([place]) 06/22/2021   Testicular cancer (HCC)    Venous stasis     Past Surgical History:  Procedure Laterality Date   CARDIAC CATHETERIZATION  09/09/1986   EF 56%   CARDIOVASCULAR STRESS TEST  12/16/2007   EF 54% NO ISCHEMIA   CHOLECYSTECTOMY     COLONOSCOPY WITH PROPOFOL N/A 09/30/2021   Procedure: COLONOSCOPY WITH PROPOFOL;  Surgeon: Otis Brace, MD;  Location: [place];  Service: Gastroenterology;  Laterality: N/A;   CORONARY ARTERY BYPASS GRAFT     LIMA TO THE LAD AND RIMA TO RIGHT CORONARY ARTERY   CORONARY STENT INTERVENTION N/A 02/14/2021   Procedure: CORONARY STENT INTERVENTION;  Surgeon: Sherren Mocha, MD;  Location: [place] CV [facility];  Service: Cardiovascular;  Laterality: N/A;   ESOPHAGOGASTRODUODENOSCOPY (EGD) WITH PROPOFOL N/A 09/30/2021   Procedure: ESOPHAGOGASTRODUODENOSCOPY (EGD) WITH PROPOFOL;  Surgeon: Otis Brace, MD;  Location: [facility];  Service: Gastroenterology;  Laterality: N/A;   ORCHIECTOMY Right 04/19/2021   Procedure: RIGHT RADICAL INGUINAL ORCHIECTOMY;  Surgeon:  Lucas Mallow, MD;  Location: [facility];  Service: Urology;  Laterality: Right;  1 HR FOR CASE   RIGHT/LEFT HEART CATH AND CORONARY/GRAFT ANGIOGRAPHY N/A 02/14/2021   Procedure: RIGHT/LEFT HEART CATH AND CORONARY/GRAFT ANGIOGRAPHY;  Surgeon: Sherren Mocha, MD;  Location: [place] [facility];  Service: Cardiovascular;  Laterality: N/A;   TEE WITHOUT CARDIOVERSION N/A 09/02/2020   Procedure: TRANSESOPHAGEAL ECHOCARDIOGRAM (TEE);  Surgeon: Werner Lean, MD;  Location: [facility];  Service: Cardiovascular;  Laterality: N/A;   US ECHOCARDIOGRAPHY  12/12/2007   EF 55-60%   VASECTOMY  1988    Current Medications: Current Meds   Medication Sig   acetaminophen (TYLENOL) 500 MG tablet Take 1,000 mg by mouth every 6 (six) hours as needed for moderate pain.   aspirin EC 81 MG tablet Take 1 tablet (81 mg total) by mouth daily. Swallow whole.   brimonidine (ALPHAGAN) 0.2 % ophthalmic solution Place 1 drop into the left eye 2 (two) times daily.   carvedilol (COREG) 3.125 MG tablet Take 1 tablet (3.125 mg total) by mouth 2 (two) times daily with a meal.   cyanocobalamin 1000 MCG tablet Take 1 tablet (1,000 mcg total) by mouth daily.   dapagliflozin propanediol (FARXIGA) 10 MG TABS tablet Take 1 tablet (10 mg total) by mouth daily.   dorzolamide-timolol (COSOPT) 22.3-6.8 MG/ML ophthalmic solution Place 1 drop into both eyes 2 (two) times daily.   ezetimibe (ZETIA) 10 MG tablet TAKE 1 TABLET(10 MG) BY MOUTH DAILY   feeding supplement (ENSURE ENLIVE / ENSURE PLUS) LIQD Take 237 mLs by mouth 2 (two) times daily between meals.   latanoprost (XALATAN) 0.005 % ophthalmic solution Place 1 drop into both eyes at bedtime.    magic mouthwash (nystatin, lidocaine, diphenhydrAMINE) suspension Take 5 mLs by mouth 3 (three) times daily as needed for mouth pain.   nitroGLYCERIN (NITROSTAT) 0.4 MG SL tablet Place 1 tablet (0.4 mg total) under the tongue every 5 (five) minutes as needed for chest pain.   OXYGEN Inhale 3 L into the lungs continuous.   pantoprazole (PROTONIX) 40 MG tablet 40 mg BID x 30 days then once daily after   SODIUM FLUORIDE 5000 PPM 1.1 % PSTE Apply 1 application. topically in the morning and at bedtime.   spironolactone (ALDACTONE) 25 MG tablet TAKE 1/2 TABLET(12.5 MG) BY MOUTH DAILY   triamcinolone cream (KENALOG) 0.1 % Apply 1 application topically 2 (two) times daily as needed (eczema).     Allergies:   Patient has no known allergies.   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  ROS:   Please see the history of present illness.     All other systems reviewed and are negative.  EKGs/Labs/Other Studies Reviewed:    The following studies were reviewed today:   RIGHT/LEFT HEART CATH AND CORONARY/GRAFT ANGIOGRAPHY, RIGHT/LEFT HEART CATH AND CORONARY/GRAFT ANGIOGRAPHY 02/14/2021  Narrative 1.  Severe 3-vessel coronary artery disease with total occlusion of the LAD and total occlusion of the RCA, and severe stenosis of the native left circumflex 2.  Status post CABG with continued patency of the LIMA to LAD and RIMA to RCA 3.  Successful PCI of severe 95% stenosis of the mid circumflex, treated with a 3.0 x 15 mm Onyx frontier DES 4.  Severe mitral regurgitation by echo assessment, hemodynamics demonstrate low wedge pressure, no V wave, and no pulmonary hypertension.  Recommendations: DAPT with aspirin and clopidogrel x6 months, repeat echocardiogram in 6 months, defer transcatheter edge-to-edge mitral valve repair at present to reevaluate after PCI.      ECHO COMPLETE WO IMAGING ENHANCING AGENT 07/05/2021  Narrative ECHOCARDIOGRAM REPORT    Patient Name:   JEMAL MISKELL Date of Exam:  07/05/2021 Medical Rec #:  [id number]           Height:       69.0 in Accession #:    [id number]          Weight:       145.7 lb Date of Birth:  12/11/1950           BSA:          1.806 m Patient Age:    37 years            BP:           102/61 mmHg Patient Gender: M                   HR:           76 bpm. Exam Location:  Inpatient  Procedure: 2D Echo, Cardiac Doppler and Color Doppler  Indications:    Acute respiratory distress  History:        Patient has prior history of Echocardiogram examinations, most recent 06/20/2021. CHF, CAD and Previous Myocardial Infarction, Signs/Symptoms:Murmur; Risk Factors:Dyslipidemia. CABG x 2.  Sonographer:    Luisa Hart RDCS Referring Phys: [id number] [place]   1. Left ventricular ejection fraction, by estimation, is 40 to 45%. The left ventricle has mildly decreased function. The left ventricle demonstrates regional wall motion abnormalities (see scoring diagram/findings for description). There is mild concentric left ventricular hypertrophy. Left ventricular diastolic parameters are indeterminate. There is akinesis of the left ventricular, basal-mid inferior wall. There is hypokinesis of the left ventricular, mid-apical inferolateral wall and inferior wall. 2. Right ventricular systolic function is normal. The right ventricular size is normal. There is mildly elevated pulmonary artery systolic pressure. 3. The mitral valve is abnormal. Mild to moderate mitral valve regurgitation. 4. Tricuspid valve regurgitation is mild to moderate. 5. The aortic valve is normal in structure. Aortic valve regurgitation is not visualized. No aortic stenosis is present.  Comparison(s): No significant change from prior study. Prior images reviewed side by side.  FINDINGS Left Ventricle: Left ventricular ejection fraction, by estimation, is 40 to 45%. The left ventricle has mildly decreased function. The left ventricle demonstrates regional wall  motion abnormalities. The left  ventricular internal cavity size was normal in size. There is mild concentric left ventricular hypertrophy. Left ventricular diastolic parameters are indeterminate.  Right Ventricle: The right ventricular size is normal. Right vetricular wall thickness was not well visualized. Right ventricular systolic function is normal. There is mildly elevated pulmonary artery systolic pressure. The tricuspid regurgitant velocity is 2.71 m/s, and with an assumed right atrial pressure of 8 mmHg, the estimated right ventricular systolic pressure is 50.2 mmHg.  Left Atrium: Left atrial size was normal in size.  Right Atrium: Right atrial size was normal in size.  Pericardium: There is no evidence of pericardial effusion.  Mitral Valve: The mitral valve is abnormal. Mild to moderate mitral valve regurgitation, with eccentric posteriorly directed jet.  Tricuspid Valve: The tricuspid valve is normal in structure. Tricuspid valve regurgitation is mild to moderate. No evidence of tricuspid stenosis.  Aortic Valve: The aortic valve is normal in structure. Aortic valve regurgitation is not visualized. No aortic stenosis is present. Aortic valve mean gradient measures 3.0 mmHg. Aortic valve peak gradient measures 6.7 mmHg. Aortic valve area, by VTI measures 2.55 cm.  Pulmonic Valve: The pulmonic valve was grossly normal. Pulmonic valve regurgitation is not visualized. No evidence of pulmonic stenosis.  Aorta: The aortic root, ascending aorta and aortic arch are all structurally normal, with no evidence of dilitation or obstruction.  Venous: The inferior vena cava was not well visualized.  IAS/Shunts: The interatrial septum was not well visualized.   Recent Labs: 07/04/2021: B Natriuretic Peptide 89.9 07/08/2021: Magnesium 2.3 09/28/2021: ALT 21 11/02/2021: BUN 10; Creatinine, Ser 0.51; NT-Pro BNP 308; Potassium 5.2; Sodium 131 11/14/2021: Hemoglobin 11.1; Platelet Count 395   Recent Lipid Panel    Component Value Date/Time   CHOL 136 06/16/2020 0954   TRIG 86 06/21/2021 0358   HDL 34 (L) 06/16/2020 0954   CHOLHDL 4.0 06/16/2020 0954   LDLCALC 86 06/16/2020 0954    Physical Exam:    VS:  BP (!) 110/58   Pulse 78   Ht '5\' 9"'$  (1.753 m)   Wt 134 lb (60.8 kg)   SpO2 96%   BMI 19.79 kg/m     Wt Readings from Last 3 Encounters:  02/01/22 134 lb (60.8 kg)  01/19/22 133 lb 6.1 oz (60.5 kg)  12/26/21 130 lb (59 kg)    GEN: chronically ill appearing HEENT: New slurred speech, new left sided facial drippd NECK: No JVD LYMPHATICS: No lymphadenopathy CARDIAC: RRR, no rubs, gallops; systolic heart murmur RESPIRATORY:  Decreased breath sounds in bases  ABDOMEN: Soft, non-tender, non-distended MUSCULOSKELETAL:  No edema; 4/5 R arm with contracture at rest, able to move arm, normal bilateral leg strength from prior SKIN: Warm and dry NEUROLOGIC:  Alert and oriented x 3 PSYCHIATRIC:  Normal affect   ASSESSMENT:    1. Acute stroke due to ischemia ([place])   2. Mitral valve insufficiency, unspecified etiology   3. S/P CABG x 2     PLAN:    Scalp Hematoma New R arm contracture New left sided facial droop - In talking with wife, there is concern from a new change in neurologic status (4:00 PM) from ~ 1:15 PM ([place] after Pulmonary Rehab) - stop ASA - Head CT is appropriate - discussed goals of care, stroke eval is appropriate - called EMS   Chronic DOE BOOP, managed by primary MVP with eccentric MR, not a candidate for procedural evauluation HFmrEF - continue current therapy  CAD s/p CABG with 2022 DES to LCX (  no NSTEMI) HLD Complicated by IDA Recent provoked DVT complicated by GI bleed Testicular cancer s/p therapy - stop ASA pending hematoma eval     Medication Adjustments/Labs and Tests Ordered: Current medicines are reviewed at length with the patient today.  Concerns regarding medicines are outlined above.  No orders of the  defined types were placed in this encounter.  No orders of the defined types were placed in this encounter.   There are no Patient Instructions on file for this visit.   Signed, Werner Lean, MD  02/01/2022 5:26 PM    [place]

## 2022-02-01 NOTE — Progress Notes (Signed)
Daily Session Note  Patient Details  Name: Roberto Ponce MRN: [id number] Date of Birth: 03-22-1950 Referring Provider:   [name] Pulmonary Rehab Walk Test from 01/19/2022 in [facility] for Heart, Vascular, & [place]  Referring Provider [name]       Encounter Date: 02/01/2022  Check In:  Session Check In - 02/01/22 1129       Check-In   Supervising physician immediately available to respond to emergencies [facility] - Physician supervision    Physician(s) Dr. Tacy Learn    Location [facility]    Staff Present Elmon Else, MS, ACSM-CEP, Exercise Physiologist;Randi Yevonne Pax, ACSM-CEP, Exercise Physiologist;Other;Mathieu Schloemer Ysidro Evert, RN    Virtual Visit No    Medication changes reported     No    Fall or balance concerns reported    No    Tobacco Cessation No Change    Warm-up and Cool-down Performed as group-led instruction    Resistance Training Performed Yes    VAD Patient? No    PAD/SET Patient? No      Pain Assessment   Currently in Pain? No/denies             Capillary Blood Glucose: No results found for this or any previous visit (from the past 24 hour(s)).    Social History   Tobacco Use  Smoking Status Never  Smokeless Tobacco Never    Goals Met:  Proper associated with RPD/PD & O2 Sat Exercise tolerated well No report of concerns or symptoms today Strength training completed today  Goals Unmet:  Not Applicable  Comments: Service time is from 1024 to [place]    Dr. Rodman Pickle is Medical Director for Pulmonary Rehab at [facility].

## 2022-02-01 NOTE — ED Triage Notes (Signed)
Patient BIB [facility] from cardiologist for evaluation of a slight new left sided facial droop with unknown last known normal. Patient fell on Monday, hematoma on top of head. Patient was at routine visit to cardiologist when the physician asked how long he had a facial droop. No other neurological deficits, patient is alert, oriented, and in no apparent distress at this time.  BP 138/78 99% on 2L baseline O2 18g saline lock in left [facility]

## 2022-02-02 NOTE — ED Provider Notes (Signed)
[facility] EMERGENCY DEPARTMENT Provider Note   CSN: [id number] Arrival date & time: 02/01/22  1723     History  Chief Complaint  Patient presents with   Roberto Ponce is a 71 y.o. male.  Patient is a 71 year old male with multiple medical problems including CAD, MI, chronic respiratory failure with pulmonary fibrosis on home oxygen, prior CHF no longer on Lasix but still taking spironolactone who is chronically on aspirin presenting today from cardiologist office for evaluation of a head trauma.  Patient reports on Monday he was on the edge of the bed closer than what he thought and he accidentally rolled out of the bed hitting his head.  His wife reports initially he had some abrasions on his forehead but then the next day his wife noticed a hematoma.  She reports that he has been acting normal.  He has not had headache, vision changes, difficulty getting around, nausea, vomiting.  Patient reports that he feels himself but he went to his routine cardiology visit today and they were concerned and felt that he needed to be checked out.  Cardiologist noted a left-sided facial droop but wife has not noticed anything.  Patient reports he has not had more shortness of breath recently beyond his baseline.  He checks his weights daily and his weight has been stable.  He has not noticed any new swelling in his legs or abdomen.  His appetite has been the same but he does usually eat a fairly low-sodium diet.  He does not use alcohol.  The history is provided by the patient, the spouse and medical records.  Fall       Home Medications Prior to Admission medications   Medication Sig Start Date End Date Taking? Authorizing Provider  acetaminophen (TYLENOL) 500 MG tablet Take 1,000 mg by mouth every 6 (six) hours as needed for moderate pain.    [provider]  aspirin EC 81 MG tablet Take 1 tablet (81 mg total) by mouth daily. Swallow whole. 08/23/21    Sherren Mocha, MD  brimonidine [facility]) 0.2 % ophthalmic solution Place 1 drop into the left eye 2 (two) times daily. 12/19/20   [provider]  carvedilol (COREG) 3.125 MG tablet Take 1 tablet (3.125 mg total) by mouth 2 (two) times daily with a meal. 10/06/21   Chandrasekhar, Mahesh A, MD  cyanocobalamin 1000 MCG tablet Take 1 tablet (1,000 mcg total) by mouth daily. 10/02/21   Geradine Girt, DO  dapagliflozin propanediol (FARXIGA) 10 MG TABS tablet Take 1 tablet (10 mg total) by mouth daily. 10/06/21   Chandrasekhar, Lyda Kalata A, MD  dorzolamide-timolol (COSOPT) 22.3-6.8 MG/ML ophthalmic solution Place 1 drop into both eyes 2 (two) times daily.    [provider]  ezetimibe (ZETIA) 10 MG tablet TAKE 1 TABLET(10 MG) BY MOUTH DAILY 04/16/19   Nahser, Wonda Cheng, MD  feeding supplement (ENSURE ENLIVE / ENSURE PLUS) LIQD Take 237 mLs by mouth 2 (two) times daily between meals. 07/12/21   Mercy Riding, MD  furosemide (LASIX) 40 MG tablet Take 1 tablet by mouth on Mondays, Wednesdays, and Fridays only Patient not taking: Reported on 02/01/2022 08/25/21   Nahser, Wonda Cheng, MD  latanoprost (XALATAN) 0.005 % ophthalmic solution Place 1 drop into both eyes at bedtime.  05/27/13   [provider]  magic mouthwash (nystatin, lidocaine, diphenhydrAMINE) suspension Take 5 mLs by mouth 3 (three) times daily as needed for mouth pain. 09/01/21  Freddi Starr, MD  nitroGLYCERIN (NITROSTAT) 0.4 MG SL tablet Place 1 tablet (0.4 mg total) under the tongue every 5 (five) minutes as needed for chest pain. 02/15/21 02/15/22  Sande Rives E, PA-C  OXYGEN Inhale 3 L into the lungs continuous.    [provider]  pantoprazole (PROTONIX) 40 MG tablet 40 mg BID x 30 days then once daily after 10/01/21   Eulogio Bear U, DO  SODIUM FLUORIDE 5000 PPM 1.1 % PSTE Apply 1 application. topically in the morning and at bedtime. 05/17/21   [provider]  spironolactone (ALDACTONE) 25 MG  tablet TAKE 1/2 TABLET(12.5 MG) BY MOUTH DAILY 12/29/21   Chandrasekhar, Mahesh A, MD  triamcinolone cream (KENALOG) 0.1 % Apply 1 application topically 2 (two) times daily as needed (eczema).    [provider]      Allergies    Patient has no known allergies.    Review of Systems   Review of Systems  Physical Exam Updated Vital Signs BP 130/72 (BP Location: Right Arm)   Pulse 93   Temp 98.4 F (36.9 C)   Resp 18   SpO2 100%  Physical Exam Vitals and nursing note reviewed.  Constitutional:      General: He is not in acute distress.    Appearance: He is well-developed. He is ill-appearing.     Comments: Chronically ill-appearing  HENT:     Head: Normocephalic and atraumatic.   Eyes:     Conjunctiva/sclera: Conjunctivae normal.     Pupils: Pupils are equal, round, and reactive to light.  Cardiovascular:     Rate and Rhythm: Normal rate and regular rhythm.     Heart sounds: No murmur heard. Pulmonary:     Effort: Pulmonary effort is normal. No respiratory distress.     Breath sounds: Rales present. No wheezing.     Comments: Fine crackles throughout the lungs more pronounced in the lower fields Abdominal:     General: There is no distension.     Palpations: Abdomen is soft.     Tenderness: There is no abdominal tenderness. There is no guarding or rebound.  Musculoskeletal:        General: No tenderness. Normal range of motion.     Cervical back: Normal range of motion and neck supple.     Right lower leg: No edema.     Left lower leg: No edema.  Skin:    General: Skin is warm and dry.     Findings: No erythema or rash.  Neurological:     Mental Status: He is alert and oriented to person, place, and time. Mental status is at baseline.     Cranial Nerves: No cranial nerve deficit.     Sensory: No sensory deficit.     Motor: No weakness.     Comments: No notable facial droop.  Able to raise eyebrows symmetrically, symmetric smile  Psychiatric:        Mood  and Affect: Mood normal.        Behavior: Behavior normal.     ED Results / Procedures / Treatments   Labs (all labs ordered are listed, but only abnormal results are displayed) Labs Reviewed  CBC - Abnormal; Notable for the following components:      Result Value   MCV 79.4 (*)    RDW 16.6 (*)    All other components within normal limits  COMPREHENSIVE METABOLIC PANEL - Abnormal; Notable for the following components:   Sodium 121 (*)  Potassium 5.5 (*)    Chloride 76 (*)    CO2 33 (*)    Creatinine, Ser 0.54 (*)    Albumin 3.4 (*)    Total Bilirubin 0.1 (*)    All other components within normal limits  PROTIME-INR  APTT  DIFFERENTIAL  ETHANOL  I-STAT CHEM 8, ED  CBG MONITORING, ED    EKG None  Radiology CT HEAD WO CONTRAST  Result Date: 02/01/2022 CLINICAL DATA:  Neuro deficit, acute, stroke suspected EXAM: CT HEAD WITHOUT CONTRAST TECHNIQUE: Contiguous axial images were obtained from the base of the skull through the vertex without intravenous contrast. RADIATION DOSE REDUCTION: This exam was performed according to the departmental dose-optimization program which includes automated exposure control, adjustment of the mA and/or kV according to patient size and/or use of iterative reconstruction technique. COMPARISON:  None Available. FINDINGS: Brain: No evidence of acute infarction, hemorrhage, hydrocephalus, extra-axial collection or mass lesion/mass effect. Scattered low-density changes within the periventricular and subcortical white matter compatible with chronic microvascular ischemic change. Mild diffuse cerebral volume loss. Vascular: Atherosclerotic calcifications involving the large vessels of the skull base. No unexpected hyperdense vessel. Skull: Normal. Negative for fracture or focal lesion. Sinuses/Orbits: Chronic appearing bilateral maxillary sinus disease, more pronounced on the right. Otherwise clear. Other: None. IMPRESSION: 1. No acute intracranial findings.  2. Chronic microvascular ischemic change and cerebral volume loss. 3. Chronic appearing bilateral maxillary sinus disease. Electronically Signed   By: Davina Poke D.O.   On: 02/01/2022 18:20    Procedures Procedures    Medications Ordered in ED Medications  sodium chloride flush (NS) 0.9 % injection 3 mL (3 mLs Intravenous Given 02/01/22 2142)    ED Course/ Medical Decision Making/ A&P                           Medical Decision Making Amount and/or Complexity of Data Reviewed Labs: ordered. Decision-making details documented in ED Course. Radiology: ordered and independent interpretation performed. Decision-making details documented in ED Course.   Pt with multiple medical problems and comorbidities and presenting today with a complaint that caries a high risk for morbidity and mortality.  Here today for evaluation of a head injury that occurred on Monday.  At the cardiologist office they were concerned he may have left-sided facial droop.  However patient's wife is present at bedside has not noticed anything at home he has not had any change in his baseline behavior and here is not noticed to have facial droop or focal neurologic deficit. I have independently visualized and interpreted pt's images today.  Head CT negative today for intracranial hemorrhage.  I independently interpreted patient's labs today and coags and CBC are within normal limits, CMP today shows hyponatremia with a sodium of 121 mild hyperkalemia with a potassium of 5.5 and stable creatinine.  Patient has had hyponatremia in the past but it was able to be managed as an outpatient.  He has not had recent weakness and his last medication change was several months ago from Lasix to spironolactone.  He is not had excessive water intake, does eat a low-sodium diet and does not use alcohol.  Patient has no mental status changes.  Discussed results with the patient and his wife.  He he does not appear to have excessive fluid  overload at this time.  Discussed with him the importance of following up closely with PCP and having labs rechecked on Monday.  Did give return precautions.  They are comfortable with this plan.  This time patient does not appear to require admission.          Final Clinical Impression(s) / ED Diagnoses Final diagnoses:  Fall, initial encounter  Contusion of scalp, initial encounter  Hyponatremia    Rx / DC Orders ED Discharge Orders     None         Blanchie Dessert, MD 02/02/22 0010

## 2022-02-02 NOTE — ED Provider Notes (Incomplete)
[facility] EMERGENCY DEPARTMENT Provider Note   CSN: [id number] Arrival date & time: 02/01/22  1723     History {Add pertinent medical, surgical, social history, OB history to HPI:1} Chief Complaint  Patient presents with  . Fall    MIKIE MISNER is a 71 y.o. male.  HPI     Home Medications Prior to Admission medications   Medication Sig Start Date End Date Taking? Authorizing Provider  acetaminophen (TYLENOL) 500 MG tablet Take 1,000 mg by mouth every 6 (six) hours as needed for moderate pain.    [provider]  aspirin EC 81 MG tablet Take 1 tablet (81 mg total) by mouth daily. Swallow whole. 08/23/21   Sherren Mocha, MD  brimonidine [facility]) 0.2 % ophthalmic solution Place 1 drop into the left eye 2 (two) times daily. 12/19/20   [provider]  carvedilol (COREG) 3.125 MG tablet Take 1 tablet (3.125 mg total) by mouth 2 (two) times daily with a meal. 10/06/21   Chandrasekhar, Mahesh A, MD  cyanocobalamin 1000 MCG tablet Take 1 tablet (1,000 mcg total) by mouth daily. 10/02/21   Geradine Girt, DO  dapagliflozin propanediol (FARXIGA) 10 MG TABS tablet Take 1 tablet (10 mg total) by mouth daily. 10/06/21   Chandrasekhar, Lyda Kalata A, MD  dorzolamide-timolol (COSOPT) 22.3-6.8 MG/ML ophthalmic solution Place 1 drop into both eyes 2 (two) times daily.    [provider]  ezetimibe (ZETIA) 10 MG tablet TAKE 1 TABLET(10 MG) BY MOUTH DAILY 04/16/19   Nahser, Wonda Cheng, MD  feeding supplement (ENSURE ENLIVE / ENSURE PLUS) LIQD Take 237 mLs by mouth 2 (two) times daily between meals. 07/12/21   Mercy Riding, MD  furosemide (LASIX) 40 MG tablet Take 1 tablet by mouth on Mondays, Wednesdays, and Fridays only Patient not taking: Reported on 02/01/2022 08/25/21   Nahser, Wonda Cheng, MD  latanoprost (XALATAN) 0.005 % ophthalmic solution Place 1 drop into both eyes at bedtime.  05/27/13   [provider]  magic mouthwash (nystatin,  lidocaine, diphenhydrAMINE) suspension Take 5 mLs by mouth 3 (three) times daily as needed for mouth pain. 09/01/21   Freddi Starr, MD  nitroGLYCERIN (NITROSTAT) 0.4 MG SL tablet Place 1 tablet (0.4 mg total) under the tongue every 5 (five) minutes as needed for chest pain. 02/15/21 02/15/22  Sande Rives E, PA-C  OXYGEN Inhale 3 L into the lungs continuous.    [provider]  pantoprazole (PROTONIX) 40 MG tablet 40 mg BID x 30 days then once daily after 10/01/21   Eulogio Bear U, DO  SODIUM FLUORIDE 5000 PPM 1.1 % PSTE Apply 1 application. topically in the morning and at bedtime. 05/17/21   [provider]  spironolactone (ALDACTONE) 25 MG tablet TAKE 1/2 TABLET(12.5 MG) BY MOUTH DAILY 12/29/21   Chandrasekhar, Mahesh A, MD  triamcinolone cream (KENALOG) 0.1 % Apply 1 application topically 2 (two) times daily as needed (eczema).    [provider]      Allergies    Patient has no known allergies.    Review of Systems   Review of Systems  Physical Exam Updated Vital Signs BP 130/72 (BP Location: Right Arm)   Pulse 93   Temp 98.4 F (36.9 C)   Resp 18   SpO2 100%  Physical Exam  ED Results / Procedures / Treatments   Labs (all labs ordered are listed, but only abnormal results are displayed) Labs Reviewed  CBC - Abnormal; Notable for the  following components:      Result Value   MCV 79.4 (*)    RDW 16.6 (*)    All other components within normal limits  COMPREHENSIVE METABOLIC PANEL - Abnormal; Notable for the following components:   Sodium 121 (*)    Potassium 5.5 (*)    Chloride 76 (*)    CO2 33 (*)    Creatinine, Ser 0.54 (*)    Albumin 3.4 (*)    Total Bilirubin 0.1 (*)    All other components within normal limits  PROTIME-INR  APTT  DIFFERENTIAL  ETHANOL  I-STAT CHEM 8, ED  CBG MONITORING, ED    EKG None  Radiology CT HEAD WO CONTRAST  Result Date: 02/01/2022 CLINICAL DATA:  Neuro deficit, acute, stroke suspected EXAM:  CT HEAD WITHOUT CONTRAST TECHNIQUE: Contiguous axial images were obtained from the base of the skull through the vertex without intravenous contrast. RADIATION DOSE REDUCTION: This exam was performed according to the departmental dose-optimization program which includes automated exposure control, adjustment of the mA and/or kV according to patient size and/or use of iterative reconstruction technique. COMPARISON:  None Available. FINDINGS: Brain: No evidence of acute infarction, hemorrhage, hydrocephalus, extra-axial collection or mass lesion/mass effect. Scattered low-density changes within the periventricular and subcortical white matter compatible with chronic microvascular ischemic change. Mild diffuse cerebral volume loss. Vascular: Atherosclerotic calcifications involving the large vessels of the skull base. No unexpected hyperdense vessel. Skull: Normal. Negative for fracture or focal lesion. Sinuses/Orbits: Chronic appearing bilateral maxillary sinus disease, more pronounced on the right. Otherwise clear. Other: None. IMPRESSION: 1. No acute intracranial findings. 2. Chronic microvascular ischemic change and cerebral volume loss. 3. Chronic appearing bilateral maxillary sinus disease. Electronically Signed   By: Davina Poke D.O.   On: 02/01/2022 18:20    Procedures Procedures  {Document cardiac monitor, telemetry assessment procedure when appropriate:1}  Medications Ordered in ED Medications  sodium chloride flush (NS) 0.9 % injection 3 mL (3 mLs Intravenous Given 02/01/22 2142)    ED Course/ Medical Decision Making/ A&P                           Medical Decision Making Amount and/or Complexity of Data Reviewed Labs: ordered. Radiology: ordered.   ***  {Document critical care time when appropriate:1} {Document review of labs and clinical decision tools ie heart score, Chads2Vasc2 etc:1}  {Document your independent review of radiology images, and any outside records:1} {Document  your discussion with family members, caretakers, and with consultants:1} {Document social determinants of health affecting pt's care:1} {Document your decision making why or why not admission, treatments were needed:1} Final Clinical Impression(s) / ED Diagnoses Final diagnoses:  Fall, initial encounter  Contusion of scalp, initial encounter  Hyponatremia    Rx / DC Orders ED Discharge Orders     None

## 2022-02-05 ENCOUNTER — Telehealth: Payer: Self-pay | Admitting: Internal Medicine

## 2022-02-05 NOTE — Telephone Encounter (Signed)
Left a message to call back.

## 2022-02-05 NOTE — Telephone Encounter (Signed)
Pt wife returning call. 

## 2022-02-05 NOTE — Telephone Encounter (Signed)
Patient's wife called to see when did Dr. Gasper Sells want the patient to start back taking aspirin. Please call back to discuss

## 2022-02-06 ENCOUNTER — Encounter (HOSPITAL_COMMUNITY): Payer: PPO

## 2022-02-06 NOTE — Telephone Encounter (Signed)
Called pt spouse okay per DPR.  Spouse reports Dr. Gasper Sells stopped ASA d/t a fall pt had.  Pt went to ED and per spouse everything checked out okay.   Spouse wants to know when pt should restart ASA.  Also, reports Na low and K elevated while at ED. Wants to know if MD would like for pt to have f/u labs with our office or PCP. Will send to MD to address.

## 2022-02-06 NOTE — Progress Notes (Signed)
Daily Session Note  Patient Details  Name: Roberto Ponce MRN: [id number] Date of Birth: 1950/12/08 Referring Provider:   [name] Pulmonary Rehab Walk Test from 01/19/2022 in [facility]  Referring Provider [name]       Encounter Date: 02/01/2022  Check In:   Capillary Blood Glucose: No results found for this or any previous visit (from the past 24 hour(s)).   Exercise Prescription Changes - 02/06/22 1200       Response to Exercise   Blood Pressure (Admit) 98/54    Blood Pressure (Exit) 102/60    Heart Rate (Admit) 80 bpm    Heart Rate (Exercise) 85 bpm    Heart Rate (Exit) 71 bpm    Oxygen Saturation (Admit) 95 %    Oxygen Saturation (Exercise) 96 %    Oxygen Saturation (Exit) 98 %    Rating of Perceived Exertion (Exercise) 13    Perceived Dyspnea (Exercise) 1    Duration Progress to 30 minutes of  aerobic without signs/symptoms of physical distress    Intensity THRR unchanged      Progression   Progression Continue to progress workloads to maintain intensity without signs/symptoms of physical distress.      Resistance Training   Training Prescription Yes    Weight yellow bands    Reps 10-15    Time 10 Minutes      Oxygen   Oxygen Continuous    Liters 2      NuStep   Level 1    SPM 50    Minutes 15    METs 1.4      Oxygen   Maintain Oxygen Saturation 88% or higher             Social History   Tobacco Use  Smoking Status Never  Smokeless Tobacco Never    Goals Met:  Proper associated with RPD/PD & O2 Sat Exercise tolerated well No report of concerns or symptoms today Strength training completed today  Goals Unmet:  Not Applicable  Comments: Service time is from 1024 to 1210.    Dr. Rodman Pickle is Medical Director for Pulmonary Rehab at [facility].

## 2022-02-07 NOTE — Telephone Encounter (Signed)
Spoke with pt spouse advised of MD recommendation:  ASA is ok. Labs per PCP       Spouse expresses that PCP has moved if office is unable to draw labs what should she do.  Advised to call back or send in a my chart message and we will see how we can help.  No further questions or concerns voiced.

## 2022-02-07 NOTE — Telephone Encounter (Signed)
Left a message to call back.

## 2022-02-07 NOTE — Progress Notes (Signed)
Pulmonary Individual Treatment Plan  Patient Details  Name: ERICBERTO PADGET MRN: [id number] Date of Birth: [birth year] Referring Provider:   [name] Pulmonary Rehab Walk Test from 01/19/2022 in [facility], [facility], & [place]  Referring Provider [name]       Initial Encounter Date:  Flowsheet Row Pulmonary Rehab Walk Test from 01/19/2022 in [facility] for Heart, Vascular, & [place]  Date 01/19/22       Visit Diagnosis: Pulmonary fibrosis ([place])  Patient's Home Medications on Admission:   Current Outpatient Medications:    acetaminophen (TYLENOL) 500 MG tablet, Take 1,000 mg by mouth every 6 (six) hours as needed for moderate pain., Disp: , Rfl:    aspirin EC 81 MG tablet, Take 1 tablet (81 mg total) by mouth daily. Swallow whole., Disp: 90 tablet, Rfl: 3   brimonidine (ALPHAGAN) 0.2 % ophthalmic solution, Place 1 drop into the left eye 2 (two) times daily., Disp: , Rfl:    carvedilol (COREG) 3.125 MG tablet, Take 1 tablet (3.125 mg total) by mouth 2 (two) times daily with a meal., Disp: 60 tablet, Rfl: 11   cyanocobalamin 1000 MCG tablet, Take 1 tablet (1,000 mcg total) by mouth daily., Disp: , Rfl:    dapagliflozin propanediol (FARXIGA) 10 MG TABS tablet, Take 1 tablet (10 mg total) by mouth daily., Disp: 30 tablet, Rfl: 11   dorzolamide-timolol (COSOPT) 22.3-6.8 MG/ML ophthalmic solution, Place 1 drop into both eyes 2 (two) times daily., Disp: , Rfl:    ezetimibe (ZETIA) 10 MG tablet, TAKE 1 TABLET(10 MG) BY MOUTH DAILY, Disp: 90 tablet, Rfl: 1   feeding supplement (ENSURE ENLIVE / ENSURE PLUS) LIQD, Take 237 mLs by mouth 2 (two) times daily between meals., Disp: 237 mL, Rfl: 12   furosemide (LASIX) 40 MG tablet, Take 1 tablet by mouth on Mondays, Wednesdays, and Fridays only (Patient not taking: Reported on 02/01/2022), Disp: 40 tablet, Rfl: 3   latanoprost (XALATAN) 0.005 % ophthalmic solution, Place 1 drop  into both eyes at bedtime. , Disp: , Rfl:    magic mouthwash (nystatin, lidocaine, diphenhydrAMINE) suspension, Take 5 mLs by mouth 3 (three) times daily as needed for mouth pain., Disp: 180 mL, Rfl: 1   nitroGLYCERIN (NITROSTAT) 0.4 MG SL tablet, Place 1 tablet (0.4 mg total) under the tongue every 5 (five) minutes as needed for chest pain., Disp: 25 tablet, Rfl: 3   OXYGEN, Inhale 3 L into the lungs continuous., Disp: , Rfl:    pantoprazole (PROTONIX) 40 MG tablet, 40 mg BID x 30 days then once daily after, Disp: 60 tablet, Rfl: 1   SODIUM FLUORIDE 5000 PPM 1.1 % PSTE, Apply 1 application. topically in the morning and at bedtime., Disp: , Rfl:    spironolactone (ALDACTONE) 25 MG tablet, TAKE 1/2 TABLET(12.5 MG) BY MOUTH DAILY, Disp: 45 tablet, Rfl: 3   triamcinolone cream (KENALOG) 0.1 %, Apply 1 application topically 2 (two) times daily as needed (eczema)., Disp: , Rfl:   Past Medical History: Past Medical History:  Diagnosis Date   Asthma    BOOP (bronchiolitis obliterans with organizing pneumonia) ([place])    Chronic respiratory failure ([place])    Coronary artery disease    DVT (deep venous thrombosis) ([facility])    Glaucoma    Heart failure with mid-range ejection fraction ([place])    History of gallstones    Hyperlipidemia    Hyponatremia    Hypotension due to medication 06/22/2021  LVH (left ventricular hypertrophy)    MI (myocardial infarction) ([facility])    Mitral regurgitation    Mixed dyslipidemia    Pneumomediastinum (H[facility])    Protein calorie malnutrition ([place])    Shock ([place]) 06/22/2021   Testicular cancer (H[facility])    Venous stasis     Tobacco Use: Social History   Tobacco Use  Smoking Status Never  Smokeless Tobacco Never    Labs: Review Flowsheet  More data exists      Latest Ref Rng & Units 06/16/2020 02/14/2021 06/20/2021 06/21/2021 07/04/2021  Labs for ITP Cardiac and Pulmonary Rehab  Cholestrol 100 - 199 mg/dL 136  - - - -  LDL (calc) 0 - 99 mg/dL 86  - - - -  HDL-C >39  mg/dL 34  - - - -  Trlycerides <150 mg/dL 82  - - 86  -  Hemoglobin A1c 4.8 - 5.6 % - - 5.4  - -  PH, Arterial 7.35 - 7.45 - - 7.46  7.49  7.41  7.42  7.46   PCO2 arterial 32 - 48 mmHg - - 34  30  38  38  38   Bicarbonate 20.0 - 28.0 mmol/L - 26.5  26.0  24.3  22.9  23.9  24.6  27.0   TCO2 22 - 32 mmol/L 22 - 32 mmol/L - 28  27  - - -  Acid-base deficit 0.0 - 2.0 mmol/L - 1.0  1.0  0.1  - -  O2 Saturation % - 99.0  79.0  100  99.3  97.4  99.5  97     Capillary Blood Glucose: Lab Results  Component Value Date   GLUCAP 117 (H) 06/28/2021   GLUCAP 111 (H) 06/28/2021   GLUCAP 137 (H) 06/27/2021   GLUCAP 95 06/27/2021   GLUCAP 97 06/27/2021     Pulmonary Assessment Scores:  Pulmonary Assessment Scores     Row Name 01/19/22 1144         ADL UCSD   ADL Phase Entry     SOB Score total 93       CAT Score   CAT Score 21       mMRC Score   mMRC Score 3             UCSD: Self-administered rating of dyspnea associated with activities of daily living (ADLs) 6-point scale (0 = "not at all" to 5 = "maximal or unable to do because of breathlessness")  Scoring Scores range from 0 to 120.  Minimally important difference is 5 units  CAT: CAT can identify the health impairment of COPD patients and is better correlated with disease progression.  CAT has a scoring range of zero to 40. The CAT score is classified into four groups of low (less than 10), medium (10 - 20), high (21-30) and very high (31-40) based on the impact level of disease on health status. A CAT score over 10 suggests significant symptoms.  A worsening CAT score could be explained by an exacerbation, poor medication adherence, poor inhaler technique, or progression of COPD or comorbid conditions.  CAT MCID is 2 points  mMRC: mMRC (Modified Medical Research Council) Dyspnea Scale is used to assess the degree of baseline functional disability in patients of respiratory disease due to dyspnea. No minimal important  difference is established. A decrease in score of 1 point or greater is considered a positive change.   Pulmonary Function Assessment:  Pulmonary Function Assessment - 01/19/22 1140  Breath   Bilateral Breath Sounds Decreased    Shortness of Breath Yes;Limiting activity             Exercise Target Goals: Exercise Program Goal: Individual exercise prescription set using results from initial 6 min walk test and THRR while considering  patient's activity barriers and safety.   Exercise Prescription Goal: Initial exercise prescription builds to 30-45 minutes a day of aerobic activity, 2-3 days per week.  Home exercise guidelines will be given to patient during program as part of exercise prescription that the participant will acknowledge.  Activity Barriers & Risk Stratification:  Activity Barriers & Cardiac Risk Stratification - 01/19/22 1117       Activity Barriers & Cardiac Risk Stratification   Activity Barriers Muscular Weakness;Deconditioning;History of Falls;Balance Concerns             6 Minute Walk:  6 Minute Walk     Row Name 01/19/22 1456         6 Minute Walk   Phase Initial     Distance 545 feet     Walk Time 6 minutes     # of Rest Breaks 1  2:38-3:20     MPH 1.03     METS 2.19     RPE 13     Perceived Dyspnea  2     VO2 Peak 7.67     Symptoms No     Resting HR 83 bpm     Resting BP 116/60     Resting Oxygen Saturation  100 %     Exercise Oxygen Saturation  during 6 min walk 90 %     Max Ex. HR 104 bpm     Max Ex. BP 138/74     2 Minute Post BP 128/60       Interval HR   1 Minute HR 103     2 Minute HR 103     3 Minute HR 98     4 Minute HR 99     5 Minute HR 104     6 Minute HR 91     2 Minute Post HR 90     Interval Heart Rate? Yes       Interval Oxygen   Interval Oxygen? Yes     Baseline Oxygen Saturation % 95 %     1 Minute Oxygen Saturation % 92 %     1 Minute Liters of Oxygen 2 L     2 Minute Oxygen Saturation % 92 %      2 Minute Liters of Oxygen 2 L     3 Minute Oxygen Saturation % 93 %     3 Minute Liters of Oxygen 2 L     4 Minute Oxygen Saturation % 93 %     4 Minute Liters of Oxygen 2 L     5 Minute Oxygen Saturation % 92 %     5 Minute Liters of Oxygen 2 L     6 Minute Oxygen Saturation % 90 %     6 Minute Liters of Oxygen 2 L     2 Minute Post Oxygen Saturation % 98 %     2 Minute Post Liters of Oxygen 2 L              Oxygen Initial Assessment:  Oxygen Initial Assessment - 01/19/22 1118       Home Oxygen   Home Oxygen Device Home Concentrator;Portable Concentrator    Sleep  Oxygen Prescription Continuous    Liters per minute 2.5    Home Exercise Oxygen Prescription None    Home Resting Oxygen Prescription Continuous    Liters per minute 2.5    Compliance with Home Oxygen Use Yes      Initial 6 min Walk   Oxygen Used Continuous    Liters per minute 3      Program Oxygen Prescription   Program Oxygen Prescription Continuous    Liters per minute 3      Intervention   Short Term Goals To learn and exhibit compliance with exercise, home and travel O2 prescription;To learn and understand importance of maintaining oxygen saturations>88%;To learn and understand importance of monitoring SPO2 with pulse oximeter and demonstrate accurate use of the pulse oximeter.;To learn and demonstrate proper pursed lip breathing techniques or other breathing techniques.     Long  Term Goals Exhibits compliance with exercise, home  and travel O2 prescription;Maintenance of O2 saturations>88%;Exhibits proper breathing techniques, such as pursed lip breathing or other method taught during program session;Verbalizes importance of monitoring SPO2 with pulse oximeter and return demonstration             Oxygen Re-Evaluation:  Oxygen Re-Evaluation     Row Name 01/31/22 1220             Program Oxygen Prescription   Program Oxygen Prescription Continuous       Liters per minute 3         Home  Oxygen   Home Oxygen Device Home Concentrator;Portable Concentrator       Sleep Oxygen Prescription Continuous       Liters per minute 2.5       Home Exercise Oxygen Prescription None       Home Resting Oxygen Prescription Continuous       Liters per minute 2.5       Compliance with Home Oxygen Use Yes         Goals/Expected Outcomes   Short Term Goals To learn and exhibit compliance with exercise, home and travel O2 prescription;To learn and understand importance of maintaining oxygen saturations>88%;To learn and understand importance of monitoring SPO2 with pulse oximeter and demonstrate accurate use of the pulse oximeter.;To learn and demonstrate proper pursed lip breathing techniques or other breathing techniques.        Long  Term Goals Exhibits compliance with exercise, home  and travel O2 prescription;Maintenance of O2 saturations>88%;Exhibits proper breathing techniques, such as pursed lip breathing or other method taught during program session;Verbalizes importance of monitoring SPO2 with pulse oximeter and return demonstration       Goals/Expected Outcomes Compliance and understanding of oxygen saturation monitoring and breathing techniques to decrease shortness of breath.                Oxygen Discharge (Final Oxygen Re-Evaluation):  Oxygen Re-Evaluation - 01/31/22 1220       Program Oxygen Prescription   Program Oxygen Prescription Continuous    Liters per minute 3      Home Oxygen   Home Oxygen Device Home Concentrator;Portable Concentrator    Sleep Oxygen Prescription Continuous    Liters per minute 2.5    Home Exercise Oxygen Prescription None    Home Resting Oxygen Prescription Continuous    Liters per minute 2.5    Compliance with Home Oxygen Use Yes      Goals/Expected Outcomes   Short Term Goals To learn and exhibit compliance with exercise, home and travel O2 prescription;To  learn and understand importance of maintaining oxygen saturations>88%;To learn and  understand importance of monitoring SPO2 with pulse oximeter and demonstrate accurate use of the pulse oximeter.;To learn and demonstrate proper pursed lip breathing techniques or other breathing techniques.     Long  Term Goals Exhibits compliance with exercise, home  and travel O2 prescription;Maintenance of O2 saturations>88%;Exhibits proper breathing techniques, such as pursed lip breathing or other method taught during program session;Verbalizes importance of monitoring SPO2 with pulse oximeter and return demonstration    Goals/Expected Outcomes Compliance and understanding of oxygen saturation monitoring and breathing techniques to decrease shortness of breath.             Initial Exercise Prescription:  Initial Exercise Prescription - 01/19/22 1400       Date of Initial Exercise RX and Referring Provider   Date 01/19/22    Referring Provider [name]    Expected Discharge Date 03/29/22      Oxygen   Oxygen Continuous    Liters 2    Maintain Oxygen Saturation 88% or higher      NuStep   Level 1    SPM 50    Minutes 15      Prescription Details   Frequency (times per week) 2    Duration Progress to 30 minutes of continuous aerobic without signs/symptoms of physical distress      Intensity   THRR 40-80% of Max Heartrate 60-120    Ratings of Perceived Exertion 11-13    Perceived Dyspnea 0-4      Progression   Progression Continue progressive overload as per policy without signs/symptoms or physical distress.      Resistance Training   Training Prescription Yes    Weight yellow bands    Reps 10-15             Perform Capillary Blood Glucose checks as needed.  Exercise Prescription Changes:   Exercise Prescription Changes     Row Name 02/06/22 1200             Response to Exercise   Blood Pressure (Admit) 98/54       Blood Pressure (Exit) 102/60       Heart Rate (Admit) 80 bpm       Heart Rate (Exercise) 85 bpm       Heart Rate (Exit) 71 bpm        Oxygen Saturation (Admit) 95 %       Oxygen Saturation (Exercise) 96 %       Oxygen Saturation (Exit) 98 %       Rating of Perceived Exertion (Exercise) 13       Perceived Dyspnea (Exercise) 1       Duration Progress to 30 minutes of  aerobic without signs/symptoms of physical distress       Intensity THRR unchanged         Progression   Progression Continue to progress workloads to maintain intensity without signs/symptoms of physical distress.         Resistance Training   Training Prescription Yes       Weight yellow bands       Reps 10-15       Time 10 Minutes         Oxygen   Oxygen Continuous       Liters 2         NuStep   Level 1       SPM 50  Minutes 15       METs 1.4         Oxygen   Maintain Oxygen Saturation 88% or higher                Exercise Comments:   Exercise Comments     Row Name 01/25/22 1211           Exercise Comments Pt completed first day of exercise. Arrived in wheelchair and transferred to RW. Pt high fall risk due to sight and weakness therefore used contact guard assist walking around gym for his first day. Needed brief seated rest during warm up. Exercised on recumbent stepper 30 min at level 1, METs avg 1.2. Pt took a few rests during exercise. Pt performed resistance bands seated due to poor balance. Performed sit to stand exercises. Will progress pt as tolerated.                Exercise Goals and Review:   Exercise Goals     Row Name 01/19/22 1500 01/31/22 1210           Exercise Goals   Increase Physical Activity Yes Yes      Intervention Provide advice, education, support and counseling about physical activity/exercise needs.;Develop an individualized exercise prescription for aerobic and resistive training based on initial evaluation findings, risk stratification, comorbidities and participant's personal goals. Provide advice, education, support and counseling about physical activity/exercise needs.;Develop an  individualized exercise prescription for aerobic and resistive training based on initial evaluation findings, risk stratification, comorbidities and participant's personal goals.      Expected Outcomes Short Term: Attend rehab on a regular basis to increase amount of physical activity.;Long Term: Exercising regularly at least 3-5 days a week.;Long Term: Add in home exercise to make exercise part of routine and to increase amount of physical activity. Short Term: Attend rehab on a regular basis to increase amount of physical activity.;Long Term: Exercising regularly at least 3-5 days a week.;Long Term: Add in home exercise to make exercise part of routine and to increase amount of physical activity.      Increase Strength and Stamina Yes Yes      Intervention Provide advice, education, support and counseling about physical activity/exercise needs.;Develop an individualized exercise prescription for aerobic and resistive training based on initial evaluation findings, risk stratification, comorbidities and participant's personal goals. Provide advice, education, support and counseling about physical activity/exercise needs.;Develop an individualized exercise prescription for aerobic and resistive training based on initial evaluation findings, risk stratification, comorbidities and participant's personal goals.      Expected Outcomes Short Term: Increase workloads from initial exercise prescription for resistance, speed, and METs.;Short Term: Perform resistance training exercises routinely during rehab and add in resistance training at home;Long Term: Improve cardiorespiratory fitness, muscular endurance and strength as measured by increased METs and functional capacity (6MWT) Short Term: Increase workloads from initial exercise prescription for resistance, speed, and METs.;Short Term: Perform resistance training exercises routinely during rehab and add in resistance training at home;Long Term: Improve  cardiorespiratory fitness, muscular endurance and strength as measured by increased METs and functional capacity (6MWT)      Able to understand and use rate of perceived exertion (RPE) scale Yes Yes      Intervention Provide education and explanation on how to use RPE scale Provide education and explanation on how to use RPE scale      Expected Outcomes Short Term: Able to use RPE daily in rehab to express subjective intensity level;Long Term:  Able to use RPE to guide intensity level when exercising independently Short Term: Able to use RPE daily in rehab to express subjective intensity level;Long Term:  Able to use RPE to guide intensity level when exercising independently      Able to understand and use Dyspnea scale Yes Yes      Intervention Provide education and explanation on how to use Dyspnea scale Provide education and explanation on how to use Dyspnea scale      Expected Outcomes Short Term: Able to use Dyspnea scale daily in rehab to express subjective sense of shortness of breath during exertion;Long Term: Able to use Dyspnea scale to guide intensity level when exercising independently Short Term: Able to use Dyspnea scale daily in rehab to express subjective sense of shortness of breath during exertion;Long Term: Able to use Dyspnea scale to guide intensity level when exercising independently      Knowledge and understanding of Target Heart Rate Range (THRR) Yes Yes      Intervention Provide education and explanation of THRR including how the numbers were predicted and where they are located for reference Provide education and explanation of THRR including how the numbers were predicted and where they are located for reference      Expected Outcomes Short Term: Able to state/look up THRR;Long Term: Able to use THRR to govern intensity when exercising independently;Short Term: Able to use daily as guideline for intensity in rehab Short Term: Able to state/look up THRR;Long Term: Able to use THRR  to govern intensity when exercising independently;Short Term: Able to use daily as guideline for intensity in rehab      Understanding of Exercise Prescription Yes Yes      Intervention Provide education, explanation, and written materials on patient's individual exercise prescription Provide education, explanation, and written materials on patient's individual exercise prescription      Expected Outcomes Short Term: Able to explain program exercise prescription;Long Term: Able to explain home exercise prescription to exercise independently Short Term: Able to explain program exercise prescription;Long Term: Able to explain home exercise prescription to exercise independently               Exercise Goals Re-Evaluation :  Exercise Goals Re-Evaluation     Row Name 01/31/22 1211             Exercise Goal Re-Evaluation   Exercise Goals Review Increase Physical Activity;Able to understand and use Dyspnea scale;Understanding of Exercise Prescription;Increase Strength and Stamina;Knowledge and understanding of Target Heart Rate Range (THRR);Able to understand and use rate of perceived exertion (RPE) scale       Comments Ayansh has completed 2 exercise sessions. He exercises on the Nustep for 30 min. Chanc averages 1.3 METs at level 1 on the Nustep. He performs the warmup and cooldown standing/seated dependent on his shortness of breath and fatigue. Aysen is very deconditioned. He comes to [facility] in a wheelchair and is transitioned to a rollator. I discussed Daisean getting a rollator with his wife. She agreed. It is too soon to note any discernable progressions. Will continue to monitor and progress as able.       Expected Outcomes Through exercise at rehab and home, the patient will decrease shortness of breath with daily activities and feel confident in carrying out an exercise regimen at home.                Discharge Exercise Prescription (Final Exercise Prescription Changes):  Exercise  Prescription Changes - 02/06/22 1200  Response to Exercise   Blood Pressure (Admit) 98/54    Blood Pressure (Exit) 102/60    Heart Rate (Admit) 80 bpm    Heart Rate (Exercise) 85 bpm    Heart Rate (Exit) 71 bpm    Oxygen Saturation (Admit) 95 %    Oxygen Saturation (Exercise) 96 %    Oxygen Saturation (Exit) 98 %    Rating of Perceived Exertion (Exercise) 13    Perceived Dyspnea (Exercise) 1    Duration Progress to 30 minutes of  aerobic without signs/symptoms of physical distress    Intensity THRR unchanged      Progression   Progression Continue to progress workloads to maintain intensity without signs/symptoms of physical distress.      Resistance Training   Training Prescription Yes    Weight yellow bands    Reps 10-15    Time 10 Minutes      Oxygen   Oxygen Continuous    Liters 2      NuStep   Level 1    SPM 50    Minutes 15    METs 1.4      Oxygen   Maintain Oxygen Saturation 88% or higher             Nutrition:  Target Goals: Understanding of nutrition guidelines, daily intake of sodium <1542m, cholesterol <2067m calories 30% from fat and 7% or less from saturated fats, daily to have 5 or more servings of fruits and vegetables.  Biometrics:  Pre Biometrics - 01/19/22 1102       Pre Biometrics   Grip Strength 16 kg              Nutrition Therapy Plan and Nutrition Goals:  Nutrition Therapy & Goals - 01/25/22 1159       Nutrition Therapy   Diet Heart Healthy diet    Protein (specify units) 87-109g/day (1.2-1.5g/kg ideal body weight)      Personal Nutrition Goals   Nutrition Goal Patient to identify strategies for weight gain of 0.5-2.0# per week    Personal Goal #2 Patient to limit to <2300-150086modium daily.    Comments CobGeovanis recent diagnosis of protein calorie malnutrition; his typical adult weight is ~164#. He is down 32# (19.5%) since 05/18/2021. He reports early satiety when eating and significantly reduced appetite. He  does have history of heart attack at age [age]AD, CHF; his wife reports following a low fat/low cholesterol diet up until hospital admission and subsequent weight loss in spring 2023. He does drink up to 1 ensure plus daily (350kcals, 16g protein). He is taking 1000 mcg B12. He has hx of  iron deficieny anemia treated with 4 iron infusions with last infusion 12/05/2021. His wife, LinVaughan Bastaas present today and remains very supportive.  Calories needs [id number]/kg IBW), 1788kcals ([place]in 1.5, actual body weight).      Intervention Plan   Intervention Prescribe, educate and counsel regarding individualized specific dietary modifications aiming towards targeted core components such as weight, hypertension, lipid management, diabetes, heart failure and other comorbidities.;Nutrition handout(s) given to patient.    Expected Outcomes Short Term Goal: Understand basic principles of dietary content, such as calories, fat, sodium, cholesterol and nutrients.;Short Term Goal: A plan has been developed with personal nutrition goals set during dietitian appointment.;Long Term Goal: Adherence to prescribed nutrition plan.             Nutrition Assessments:  Nutrition Assessments - 01/25/22 1222  Rate Your Plate Scores   Pre Score 72            MEDIFICTS Score Key: ?70 Need to make dietary changes  40-70 Heart Healthy Diet ? 40 Therapeutic Level Cholesterol Diet  Flowsheet Row PULMONARY REHAB OTHER RESPIRATORY from 01/25/2022 in [facility]  Picture Your Plate Total Score on Admission 72      Picture Your Plate Scores: <33 Unhealthy dietary pattern with much room for improvement. 41-50 Dietary pattern unlikely to meet recommendations for good health and room for improvement. 51-60 More healthful dietary pattern, with some room for improvement.  >60 Healthy dietary pattern, although there may be some specific behaviors  that could be improved.    Nutrition Goals Re-Evaluation:  Nutrition Goals Re-Evaluation     [place] Name 01/25/22 1159             Goals   Current Weight 132 lb 1.6 oz (59.9 kg)       Comment Calories needs 2181-2544 (30-35kcals/kg IBW), 1788kcals ([place]in 1.5, actual body weight).       Expected Outcome Jakaleb has recent diagnosis of protein calorie malnutrition; his typical adult weight is ~164#. He is down 32# (19.5%) since 05/18/2021. He reports early satiety when eating taking 1.5-2 hours to finish a meal and significantly reduced appetite. He does have history of heart attack at age 68, CAD, CHF; his wife reports that they followed a low fat/low cholesterol diet up until hospital admission and subsequent weight loss in spring 2023. He does drink up to 1 ensure plus daily (350kcals, 16g protein). He is taking 1000 mcg B12. He has hx of iron deficieny anemia treated with 4 iron infusions with last infusion 12/05/2021. His wife, [name], was present today and remains very supportive. Counseled on multiple strategies for weight gain including increasing Ensure Plus 2x per day, increasing calories from fat, shortening eating intervals and portions offered but offering nutrition >6x/day. Reviewed protein needs/sources and sodium intake.                Nutrition Goals Discharge (Final Nutrition Goals Re-Evaluation):  Nutrition Goals Re-Evaluation - 01/25/22 1159       Goals   Current Weight 132 lb 1.6 oz (59.9 kg)    Comment Calories needs 5456-2563 (30-35kcals/kg IBW), 1788kcals (Mif[place]in 1.5, actual body weight).    Expected Outcome Wandell has recent diagnosis of protein calorie malnutrition; his typical adult weight is ~164#. He is down 32# (19.5%) since 05/18/2021. He reports early satiety when eating taking 1.5-2 hours to finish a meal and significantly reduced appetite. He does have history of heart attack at age 50, CAD, CHF; his wife reports that they followed a low fat/low cholesterol  diet up until hospital admission and subsequent weight loss in spring 2023. He does drink up to 1 ensure plus daily (350kcals, 16g protein). He is taking 1000 mcg B12. He has hx of iron deficieny anemia treated with 4 iron infusions with last infusion 12/05/2021. His wife, [name], was present today and remains very supportive. Counseled on multiple strategies for weight gain including increasing Ensure Plus 2x per day, increasing calories from fat, shortening eating intervals and portions offered but offering nutrition >6x/day. Reviewed protein needs/sources and sodium intake.             Psychosocial: Target Goals: Acknowledge presence or absence of significant depression and/or stress, maximize coping skills, provide positive support system. Participant is able to verbalize types  and ability to use techniques and skills needed for reducing stress and depression.  Initial Review & Psychosocial Screening:  Initial Psych Review & Screening - 01/19/22 1133       Initial Review   Current issues with Current Depression      Family Dynamics   Good Support System? Yes    Comments Pt has a very support wife      Barriers   Psychosocial barriers to participate in program The patient should benefit from training in stress management and relaxation.;Psychosocial barriers identified (see note)      Screening Interventions   Interventions Encouraged to exercise;To provide support and resources with identified psychosocial needs;Provide feedback about the scores to participant    Expected Outcomes Short Term goal: Utilizing psychosocial counselor, staff and physician to assist with identification of specific Stressors or current issues interfering with healing process. Setting desired goal for each stressor or current issue identified.;Short Term goal: Identification and review with participant of any Quality of Life or Depression concerns found by scoring the questionnaire.;Long Term goal: The  participant improves quality of Life and PHQ9 Scores as seen by post scores and/or verbalization of changes;Long Term Goal: Stressors or current issues are controlled or eliminated.             Quality of Life Scores:  Scores of 19 and below usually indicate a poorer quality of life in these areas.  A difference of  2-3 points is a clinically meaningful difference.  A difference of 2-3 points in the total score of the Quality of Life Index has been associated with significant improvement in overall quality of life, self-image, physical symptoms, and general health in studies assessing change in quality of life.  PHQ-9: Review Flowsheet       01/19/2022 08/31/2016  Depression screen PHQ 2/9  Decreased Interest 1 0  Down, Depressed, Hopeless 1 0  PHQ - 2 Score 2 0  Altered sleeping 0 -  Tired, decreased energy 0 -  Change in appetite 2 -  Feeling bad or failure about yourself  1 -  Trouble concentrating 0 -  Moving slowly or fidgety/restless 0 -  Suicidal thoughts 0 -  PHQ-9 Score 5 -  Difficult doing work/chores Somewhat difficult -   Interpretation of Total Score  Total Score Depression Severity:  1-4 = Minimal depression, 5-9 = Mild depression, 10-14 = Moderate depression, 15-19 = Moderately severe depression, 20-27 = Severe depression   Psychosocial Evaluation and Intervention:  Psychosocial Evaluation - 01/19/22 1137       Psychosocial Evaluation & Interventions   Interventions Relaxation education;Stress management education;Encouraged to exercise with the program and follow exercise prescription    Comments Pt stated he may have some anxiety and depression, will provide resources to patient for a therapist    Expected Outcomes Jermery will participate in rehab free of pysosocial barriers    Continue Psychosocial Services  Follow up required by staff             Psychosocial Re-Evaluation:  Psychosocial Re-Evaluation     [place] Name 02/05/22 1501              Psychosocial Re-Evaluation   Current issues with Current Depression       Comments Detrell expressed that he felt depressed after a doctor told him he didn't have much longer to live. Carroll stated that he's a Darrick Meigs and that he is saved if he would pass. We provided resources to [facility] for a therapist  who specializes in coping with chronic conditions and end-of-life care. Axil was receptive in receiving this resource. He stated he has a really support wife who cares for him at home.       Expected Outcomes Jamol will continue to participate in pulmonary rehab free of psychosocial barriers.       Interventions Stress management education;Relaxation education;Encouraged to attend Pulmonary Rehabilitation for the exercise       Continue Psychosocial Services  No Follow up required                Psychosocial Discharge (Final Psychosocial Re-Evaluation):  Psychosocial Re-Evaluation - 02/05/22 1501       Psychosocial Re-Evaluation   Current issues with Current Depression    Comments Bradan expressed that he felt depressed after a doctor told him he didn't have much longer to live. Darrie stated that he's a Darrick Meigs and that he is saved if he would pass. We provided resources to [facility] for a therapist who specializes in coping with chronic conditions and end-of-life care. Pape was receptive in receiving this resource. He stated he has a really support wife who cares for him at home.    Expected Outcomes Sankalp will continue to participate in pulmonary rehab free of psychosocial barriers.    Interventions Stress management education;Relaxation education;Encouraged to attend Pulmonary Rehabilitation for the exercise    Continue Psychosocial Services  No Follow up required             Education: Education Goals: Education classes will be provided on a weekly basis, covering required topics. Participant will state understanding/return demonstration of topics presented.  Learning  Barriers/Preferences:  Learning Barriers/Preferences - 01/19/22 1117       Learning Barriers/Preferences   Learning Barriers Sight    Learning Preferences Audio;Skilled Demonstration;Verbal Instruction;Video             Education Topics: Introduction to Pulmonary Rehab Group instruction provided by PowerPoint, verbal discussion, and written material to support subject matter. Instructor reviews what Pulmonary Rehab is, the purpose of the program, and how patients are referred.     Know Your Numbers Group instruction that is supported by a PowerPoint presentation. Instructor discusses importance of knowing and understanding resting, exercise, and post-exercise oxygen saturation, heart rate, and blood pressure. Oxygen saturation, heart rate, blood pressure, rating of perceived exertion, and dyspnea are reviewed along with a normal range for these values.    Exercise for the Pulmonary Patient Group instruction that is supported by a PowerPoint presentation. Instructor discusses benefits of exercise, core components of exercise, frequency, duration, and intensity of an exercise routine, importance of utilizing pulse oximetry during exercise, safety while exercising, and options of places to exercise outside of rehab.       MET Level  Group instruction provided by PowerPoint, verbal discussion, and written material to support subject matter. Instructor reviews what METs are and how to increase METs.    Pulmonary Medications Verbally interactive group education provided by instructor with focus on inhaled medications and proper administration.   Anatomy and Physiology of the Respiratory System Group instruction provided by PowerPoint, verbal discussion, and written material to support subject matter. Instructor reviews respiratory cycle and anatomical components of the respiratory system and their functions. Instructor also reviews differences in obstructive and restrictive  respiratory diseases with examples of each.    Oxygen Safety Group instruction provided by PowerPoint, verbal discussion, and written material to support subject matter. There is an overview of "What is Oxygen" and "Why  do we need it".  Instructor also reviews how to create a safe environment for oxygen use, the importance of using oxygen as prescribed, and the risks of noncompliance. There is a brief discussion on traveling with oxygen and resources the patient may utilize.   Oxygen Use Group instruction provided by PowerPoint, verbal discussion, and written material to discuss how supplemental oxygen is prescribed and different types of oxygen supply systems. Resources for more information are provided.    Breathing Techniques Group instruction that is supported by demonstration and informational handouts. Instructor discusses the benefits of pursed lip and diaphragmatic breathing and detailed demonstration on how to perform both.     Risk Factor Reduction Group instruction that is supported by a PowerPoint presentation. Instructor discusses the definition of a risk factor, different risk factors for pulmonary disease, and how the heart and lungs work together.   MD Day A group question and answer session with a medical doctor that allows participants to ask questions that relate to their pulmonary disease state.   Nutrition for the Pulmonary Patient Group instruction provided by PowerPoint slides, verbal discussion, and written materials to support subject matter. The instructor gives an explanation and review of healthy diet recommendations, which includes a discussion on weight management, recommendations for fruit and vegetable consumption, as well as protein, fluid, caffeine, fiber, sodium, sugar, and alcohol. Tips for eating when patients are short of breath are discussed.    Other Education Group or individual verbal, written, or video instructions that support the educational  goals of the pulmonary rehab program. Flowsheet Row PULMONARY REHAB OTHER RESPIRATORY from 02/01/2022 in [facility]  Date 02/01/22  Educator Donnetta Simpers  Instruction Review Code 1- Verbalizes Understanding        Knowledge Questionnaire Score:  Knowledge Questionnaire Score - 01/19/22 1145       Knowledge Questionnaire Score   Pre Score 15/18             Core Components/Risk Factors/Patient Goals at Admission:  Personal Goals and Risk Factors at Admission - 01/19/22 1138       Core Components/Risk Factors/Patient Goals on Admission    Weight Management Yes;Weight Gain    Intervention Weight Management: Develop a combined nutrition and exercise program designed to reach desired caloric intake, while maintaining appropriate intake of nutrient and fiber, sodium and fats, and appropriate energy expenditure required for the weight goal.;Weight Management: Provide education and appropriate resources to help participant work on and attain dietary goals.    Expected Outcomes Short Term: Continue to assess and modify interventions until short term weight is achieved;Long Term: Adherence to nutrition and physical activity/exercise program aimed toward attainment of established weight goal;Weight Gain: Understanding of general recommendations for a high calorie, high protein meal plan that promotes weight gain by distributing calorie intake throughout the day with the consumption for 4-5 meals, snacks, and/or supplements    Improve shortness of breath with ADL's Yes    Intervention Provide education, individualized exercise plan and daily activity instruction to help decrease symptoms of SOB with activities of daily living.    Expected Outcomes Long Term: Be able to perform more ADLs without symptoms or delay the onset of symptoms;Short Term: Improve cardiorespiratory fitness to achieve a reduction of symptoms when performing ADLs              Core Components/Risk Factors/Patient Goals Review:   Goals and Risk Factor Review     Row Name  02/05/22 1510             Core Components/Risk Factors/Patient Goals Review   Personal Goals Review Weight Management/Obesity;Develop more efficient breathing techniques such as purse lipped breathing and diaphragmatic breathing and practicing self-pacing with activity.;Improve shortness of breath with ADL's       Review Khiree as well as his wife [name] have both attended the stress and energy and breathing technique education classes. Tannon enjoys coming to class to work and increase his energy and endurance. His oxygen saturation has been stable when exercising on 2L. Tandy is also working closely with our dietician on gaining weight. His weight has increased from 59.9kg to 61.kg. Marek has been exercising on the NuStep and has increased his METS from 1.2 to 1.4. He is also doing a good job with the resistance bands.       Expected Outcomes See admission goals                Core Components/Risk Factors/Patient Goals at Discharge (Final Review):   Goals and Risk Factor Review - 02/05/22 1510       Core Components/Risk Factors/Patient Goals Review   Personal Goals Review Weight Management/Obesity;Develop more efficient breathing techniques such as purse lipped breathing and diaphragmatic breathing and practicing self-pacing with activity.;Improve shortness of breath with ADL's    Review Kyaire as well as his wife [name] have both attended the stress and energy and breathing technique education classes. Jedidiah enjoys coming to class to work and increase his energy and endurance. His oxygen saturation has been stable when exercising on 2L. Hughes is also working closely with our dietician on gaining weight. His weight has increased from 59.9kg to 61.kg. Ramsey has been exercising on the NuStep and has increased his METS from 1.2 to 1.4. He is also doing a good job with the resistance  bands.    Expected Outcomes See admission goals             ITP Comments: Pt is making expected progress toward Pulmonary Rehab goals after completing 3 sessions. Recommend continued exercise, life style modification, education, and utilization of breathing techniques to increase stamina and strength, while also decreasing shortness of breath with exertion.    Dr. Rodman Pickle is Medical Director for Pulmonary Rehab at [facility].

## 2022-02-08 ENCOUNTER — Inpatient Hospital Stay (HOSPITAL_COMMUNITY): Payer: PPO

## 2022-02-08 ENCOUNTER — Emergency Department (HOSPITAL_COMMUNITY): Payer: PPO

## 2022-02-08 ENCOUNTER — Telehealth (HOSPITAL_COMMUNITY): Payer: Self-pay

## 2022-02-08 ENCOUNTER — Encounter (HOSPITAL_COMMUNITY): Payer: PPO

## 2022-02-08 ENCOUNTER — Inpatient Hospital Stay (HOSPITAL_COMMUNITY)
Admission: EM | Admit: 2022-02-08 | Discharge: 2022-02-14 | DRG: 208 | Disposition: A | Discharge status: Home Health | Payer: PPO | Attending: Internal Medicine | Admitting: Internal Medicine

## 2022-02-08 ENCOUNTER — Inpatient Hospital Stay: Payer: Self-pay

## 2022-02-08 DIAGNOSIS — E871 Hypo-osmolality and hyponatremia: Secondary | ICD-10-CM

## 2022-02-08 DIAGNOSIS — Z951 Presence of aortocoronary bypass graft: Secondary | ICD-10-CM | POA: Diagnosis not present

## 2022-02-08 DIAGNOSIS — J9622 Acute and chronic respiratory failure with hypercapnia: Secondary | ICD-10-CM | POA: Diagnosis present

## 2022-02-08 DIAGNOSIS — Z9181 History of falling: Secondary | ICD-10-CM

## 2022-02-08 DIAGNOSIS — Z9981 Dependence on supplemental oxygen: Secondary | ICD-10-CM | POA: Diagnosis not present

## 2022-02-08 DIAGNOSIS — E8729 Other acidosis: Secondary | ICD-10-CM | POA: Diagnosis not present

## 2022-02-08 DIAGNOSIS — Z8616 Personal history of COVID-19: Secondary | ICD-10-CM

## 2022-02-08 DIAGNOSIS — I340 Nonrheumatic mitral (valve) insufficiency: Secondary | ICD-10-CM | POA: Diagnosis not present

## 2022-02-08 DIAGNOSIS — J9621 Acute and chronic respiratory failure with hypoxia: Principal | ICD-10-CM | POA: Diagnosis present

## 2022-02-08 DIAGNOSIS — H409 Unspecified glaucoma: Secondary | ICD-10-CM | POA: Diagnosis present

## 2022-02-08 DIAGNOSIS — T68XXXA Hypothermia, initial encounter: Secondary | ICD-10-CM | POA: Diagnosis not present

## 2022-02-08 DIAGNOSIS — J9601 Acute respiratory failure with hypoxia: Principal | ICD-10-CM | POA: Diagnosis present

## 2022-02-08 DIAGNOSIS — E782 Mixed hyperlipidemia: Secondary | ICD-10-CM | POA: Diagnosis not present

## 2022-02-08 DIAGNOSIS — J9602 Acute respiratory failure with hypercapnia: Secondary | ICD-10-CM | POA: Diagnosis not present

## 2022-02-08 DIAGNOSIS — Z66 Do not resuscitate: Secondary | ICD-10-CM | POA: Diagnosis present

## 2022-02-08 DIAGNOSIS — Z7982 Long term (current) use of aspirin: Secondary | ICD-10-CM

## 2022-02-08 DIAGNOSIS — R064 Hyperventilation: Secondary | ICD-10-CM | POA: Diagnosis present

## 2022-02-08 DIAGNOSIS — Z515 Encounter for palliative care: Secondary | ICD-10-CM | POA: Diagnosis not present

## 2022-02-08 DIAGNOSIS — I959 Hypotension, unspecified: Secondary | ICD-10-CM | POA: Diagnosis present

## 2022-02-08 DIAGNOSIS — Z8249 Family history of ischemic heart disease and other diseases of the circulatory system: Secondary | ICD-10-CM

## 2022-02-08 DIAGNOSIS — J84112 Idiopathic pulmonary fibrosis: Secondary | ICD-10-CM | POA: Diagnosis not present

## 2022-02-08 DIAGNOSIS — R0682 Tachypnea, not elsewhere classified: Secondary | ICD-10-CM | POA: Diagnosis not present

## 2022-02-08 DIAGNOSIS — J189 Pneumonia, unspecified organism: Secondary | ICD-10-CM | POA: Diagnosis not present

## 2022-02-08 DIAGNOSIS — E222 Syndrome of inappropriate secretion of antidiuretic hormone: Secondary | ICD-10-CM | POA: Diagnosis not present

## 2022-02-08 DIAGNOSIS — Z681 Body mass index (BMI) 19 or less, adult: Secondary | ICD-10-CM | POA: Diagnosis not present

## 2022-02-08 DIAGNOSIS — Z1152 Encounter for screening for COVID-19: Secondary | ICD-10-CM | POA: Diagnosis not present

## 2022-02-08 DIAGNOSIS — F0280 Dementia in other diseases classified elsewhere without behavioral disturbance: Secondary | ICD-10-CM | POA: Diagnosis not present

## 2022-02-08 DIAGNOSIS — I509 Heart failure, unspecified: Secondary | ICD-10-CM | POA: Diagnosis not present

## 2022-02-08 DIAGNOSIS — Z9911 Dependence on respirator [ventilator] status: Secondary | ICD-10-CM

## 2022-02-08 DIAGNOSIS — E43 Unspecified severe protein-calorie malnutrition: Secondary | ICD-10-CM | POA: Diagnosis present

## 2022-02-08 DIAGNOSIS — Z955 Presence of coronary angioplasty implant and graft: Secondary | ICD-10-CM

## 2022-02-08 DIAGNOSIS — J440 Chronic obstructive pulmonary disease with acute lower respiratory infection: Secondary | ICD-10-CM | POA: Diagnosis not present

## 2022-02-08 DIAGNOSIS — I5022 Chronic systolic (congestive) heart failure: Secondary | ICD-10-CM | POA: Diagnosis not present

## 2022-02-08 DIAGNOSIS — R579 Shock, unspecified: Secondary | ICD-10-CM | POA: Diagnosis not present

## 2022-02-08 DIAGNOSIS — K449 Diaphragmatic hernia without obstruction or gangrene: Secondary | ICD-10-CM | POA: Diagnosis not present

## 2022-02-08 DIAGNOSIS — Z79899 Other long term (current) drug therapy: Secondary | ICD-10-CM

## 2022-02-08 DIAGNOSIS — D649 Anemia, unspecified: Secondary | ICD-10-CM | POA: Diagnosis present

## 2022-02-08 DIAGNOSIS — R0603 Acute respiratory distress: Secondary | ICD-10-CM | POA: Diagnosis not present

## 2022-02-08 DIAGNOSIS — Z8547 Personal history of malignant neoplasm of testis: Secondary | ICD-10-CM

## 2022-02-08 DIAGNOSIS — R680 Hypothermia, not associated with low environmental temperature: Secondary | ICD-10-CM | POA: Diagnosis present

## 2022-02-08 DIAGNOSIS — I252 Old myocardial infarction: Secondary | ICD-10-CM

## 2022-02-08 DIAGNOSIS — Z8709 Personal history of other diseases of the respiratory system: Secondary | ICD-10-CM

## 2022-02-08 DIAGNOSIS — J168 Pneumonia due to other specified infectious organisms: Secondary | ICD-10-CM | POA: Diagnosis not present

## 2022-02-08 DIAGNOSIS — G309 Alzheimer's disease, unspecified: Secondary | ICD-10-CM | POA: Diagnosis present

## 2022-02-08 DIAGNOSIS — R9431 Abnormal electrocardiogram [ECG] [EKG]: Secondary | ICD-10-CM | POA: Diagnosis not present

## 2022-02-08 DIAGNOSIS — R531 Weakness: Secondary | ICD-10-CM | POA: Diagnosis not present

## 2022-02-08 DIAGNOSIS — Z823 Family history of stroke: Secondary | ICD-10-CM

## 2022-02-08 DIAGNOSIS — I878 Other specified disorders of veins: Secondary | ICD-10-CM | POA: Diagnosis present

## 2022-02-08 DIAGNOSIS — J9620 Acute and chronic respiratory failure, unspecified whether with hypoxia or hypercapnia: Secondary | ICD-10-CM | POA: Diagnosis present

## 2022-02-08 DIAGNOSIS — R5381 Other malaise: Secondary | ICD-10-CM | POA: Diagnosis present

## 2022-02-08 DIAGNOSIS — Z7984 Long term (current) use of oral hypoglycemic drugs: Secondary | ICD-10-CM

## 2022-02-08 DIAGNOSIS — Z4682 Encounter for fitting and adjustment of non-vascular catheter: Secondary | ICD-10-CM | POA: Diagnosis not present

## 2022-02-08 DIAGNOSIS — Z7189 Other specified counseling: Secondary | ICD-10-CM | POA: Diagnosis not present

## 2022-02-08 DIAGNOSIS — I2510 Atherosclerotic heart disease of native coronary artery without angina pectoris: Secondary | ICD-10-CM | POA: Diagnosis present

## 2022-02-08 DIAGNOSIS — R0902 Hypoxemia: Secondary | ICD-10-CM | POA: Diagnosis not present

## 2022-02-08 DIAGNOSIS — R4182 Altered mental status, unspecified: Secondary | ICD-10-CM | POA: Diagnosis not present

## 2022-02-08 LAB — I-STAT ARTERIAL BLOOD GAS, ED
Acid-Base Excess: 6 mmol/L — ABNORMAL HIGH (ref 0.0–2.0)
Acid-Base Excess: 9 mmol/L — ABNORMAL HIGH (ref 0.0–2.0)
Bicarbonate: 36.3 mmol/L — ABNORMAL HIGH (ref 20.0–28.0)
Bicarbonate: 35.4 mmol/L — ABNORMAL HIGH (ref 20.0–28.0)
Calcium, Ion: 1.19 mmol/L (ref 1.15–1.40)
Calcium, Ion: 1.19 mmol/L (ref 1.15–1.40)
HCT: 36 % — ABNORMAL LOW (ref 39.0–52.0)
HCT: 39 % (ref 39.0–52.0)
Hemoglobin: 12.2 g/dL — ABNORMAL LOW (ref 13.0–17.0)
Hemoglobin: 13.3 g/dL (ref 13.0–17.0)
O2 Saturation: 100 %
O2 Saturation: 87 %
Potassium: 5.1 mmol/L (ref 3.5–5.1)
Potassium: 4.5 mmol/L (ref 3.5–5.1)
Sodium: 120 mmol/L — ABNORMAL LOW (ref 135–145)
Sodium: 119 mmol/L — CL (ref 135–145)
TCO2: 39 mmol/L — ABNORMAL HIGH (ref 22–32)
TCO2: 37 mmol/L — ABNORMAL HIGH (ref 22–32)
pCO2 arterial: 83.8 mmHg (ref 32–48)
pCO2 arterial: 54.8 mmHg — ABNORMAL HIGH (ref 32–48)
pH, Arterial: 7.244 — ABNORMAL LOW (ref 7.35–7.45)
pH, Arterial: 7.418 (ref 7.35–7.45)
pO2, Arterial: 628 mmHg — ABNORMAL HIGH (ref 83–108)
pO2, Arterial: 54 mmHg — ABNORMAL LOW (ref 83–108)

## 2022-02-08 LAB — RAPID URINE DRUG SCREEN, HOSP PERFORMED
Amphetamines: NOT DETECTED
Barbiturates: NOT DETECTED
Benzodiazepines: POSITIVE — AB
Cocaine: NOT DETECTED
Opiates: NOT DETECTED
Tetrahydrocannabinol: NOT DETECTED

## 2022-02-08 LAB — COMPREHENSIVE METABOLIC PANEL
ALT: 18 U/L (ref 0–44)
AST: 25 U/L (ref 15–41)
Albumin: 3.4 g/dL — ABNORMAL LOW (ref 3.5–5.0)
Alkaline Phosphatase: 71 U/L (ref 38–126)
Anion gap: 8 (ref 5–15)
BUN: 14 mg/dL (ref 8–23)
CO2: 36 mmol/L — ABNORMAL HIGH (ref 22–32)
Calcium: 9.2 mg/dL (ref 8.9–10.3)
Chloride: 75 mmol/L — ABNORMAL LOW (ref 98–111)
Creatinine, Ser: 0.4 mg/dL — ABNORMAL LOW (ref 0.61–1.24)
GFR, Estimated: >60 mL/min (ref >60)
Glucose, Bld: 152 mg/dL — ABNORMAL HIGH (ref 70–99)
Potassium: 5.9 mmol/L — ABNORMAL HIGH (ref 3.5–5.1)
Sodium: 119 mmol/L — CL (ref 135–145)
Total Bilirubin: 0.5 mg/dL (ref 0.3–1.2)
Total Protein: 7.1 g/dL (ref 6.5–8.1)

## 2022-02-08 LAB — URINALYSIS, ROUTINE W REFLEX MICROSCOPIC
Bacteria, UA: NONE SEEN
Bilirubin Urine: NEGATIVE
Glucose, UA: >=500 mg/dL — AB
Hgb urine dipstick: NEGATIVE
Ketones, ur: NEGATIVE mg/dL
Leukocytes,Ua: NEGATIVE
Nitrite: NEGATIVE
Protein, ur: NEGATIVE mg/dL
Specific Gravity, Urine: 1.008 (ref 1.005–1.030)
pH: 7 (ref 5.0–8.0)

## 2022-02-08 LAB — TROPONIN I (HIGH SENSITIVITY)
Troponin I (High Sensitivity): 32 ng/L — ABNORMAL HIGH (ref <18)
Troponin I (High Sensitivity): 31 ng/L — ABNORMAL HIGH (ref <18)

## 2022-02-08 LAB — CBC
HCT: 31.8 % — ABNORMAL LOW (ref 39.0–52.0)
Hemoglobin: 10.6 g/dL — ABNORMAL LOW (ref 13.0–17.0)
MCH: 26.7 pg (ref 26.0–34.0)
MCHC: 33.3 g/dL (ref 30.0–36.0)
MCV: 80.1 fL (ref 80.0–100.0)
Platelets: 158 10*3/uL (ref 150–400)
RBC: 3.97 MIL/uL — ABNORMAL LOW (ref 4.22–5.81)
RDW: 16.2 % — ABNORMAL HIGH (ref 11.5–15.5)
WBC: 8 10*3/uL (ref 4.0–10.5)
nRBC: 0 % (ref 0.0–0.2)

## 2022-02-08 LAB — MRSA NEXT GEN BY PCR, NASAL: MRSA by PCR Next Gen: NOT DETECTED

## 2022-02-08 LAB — GLUCOSE, CAPILLARY
Glucose-Capillary: 145 mg/dL — ABNORMAL HIGH (ref 70–99)
Glucose-Capillary: 83 mg/dL (ref 70–99)
Glucose-Capillary: 91 mg/dL (ref 70–99)

## 2022-02-08 LAB — CBC WITH DIFFERENTIAL/PLATELET
Abs Immature Granulocytes: 0.02 10*3/uL (ref 0.00–0.07)
Basophils Absolute: 0 10*3/uL (ref 0.0–0.1)
Basophils Relative: 1 %
Eosinophils Absolute: 0.1 10*3/uL (ref 0.0–0.5)
Eosinophils Relative: 1 %
HCT: 41.3 % (ref 39.0–52.0)
Hemoglobin: 13.4 g/dL (ref 13.0–17.0)
Immature Granulocytes: 0 %
Lymphocytes Relative: 20 %
Lymphs Abs: 1.3 10*3/uL (ref 0.7–4.0)
MCH: 26.6 pg (ref 26.0–34.0)
MCHC: 32.4 g/dL (ref 30.0–36.0)
MCV: 81.9 fL (ref 80.0–100.0)
Monocytes Absolute: 0.9 10*3/uL (ref 0.1–1.0)
Monocytes Relative: 14 %
Neutro Abs: 4.2 10*3/uL (ref 1.7–7.7)
Neutrophils Relative %: 64 %
Platelets: 201 10*3/uL (ref 150–400)
RBC: 5.04 MIL/uL (ref 4.22–5.81)
RDW: 16.7 % — ABNORMAL HIGH (ref 11.5–15.5)
WBC: 6.5 10*3/uL (ref 4.0–10.5)
nRBC: 0 % (ref 0.0–0.2)

## 2022-02-08 LAB — ETHANOL: Alcohol, Ethyl (B): <10 mg/dL (ref <10)

## 2022-02-08 LAB — LACTIC ACID, PLASMA
Lactic Acid, Venous: 0.7 mmol/L (ref 0.5–1.9)
Lactic Acid, Venous: 1.3 mmol/L (ref 0.5–1.9)

## 2022-02-08 LAB — AMMONIA: Ammonia: 36 umol/L — ABNORMAL HIGH (ref 9–35)

## 2022-02-08 LAB — CREATININE, SERUM
Creatinine, Ser: 0.45 mg/dL — ABNORMAL LOW (ref 0.61–1.24)
GFR, Estimated: >60 mL/min (ref >60)

## 2022-02-08 LAB — CORTISOL: Cortisol, Plasma: 16.6 ug/dL

## 2022-02-08 LAB — RESP PANEL BY RT-PCR (RSV, FLU A&B, COVID)  RVPGX2
Influenza A by PCR: NEGATIVE
Influenza B by PCR: NEGATIVE
Resp Syncytial Virus by PCR: NEGATIVE
SARS Coronavirus 2 by RT PCR: NEGATIVE

## 2022-02-08 LAB — PROCALCITONIN: Procalcitonin: <0.1 ng/mL

## 2022-02-08 LAB — BRAIN NATRIURETIC PEPTIDE: B Natriuretic Peptide: 635.1 pg/mL — ABNORMAL HIGH (ref 0.0–100.0)

## 2022-02-08 LAB — CBG MONITORING, ED: Glucose-Capillary: 138 mg/dL — ABNORMAL HIGH (ref 70–99)

## 2022-02-08 LAB — LACTATE DEHYDROGENASE: LDH: 219 U/L — ABNORMAL HIGH (ref 98–192)

## 2022-02-08 MED ORDER — ROCURONIUM BROMIDE 50 MG/5ML IV SOLN
INTRAVENOUS | Status: DC | PRN
  Administered 2022-02-08: 60 mg via INTRAVENOUS

## 2022-02-08 MED ORDER — LACTATED RINGERS IV BOLUS
1000.0000 mL | Freq: Once | INTRAVENOUS | Status: AC
  Administered 2022-02-08: 1000 mL via INTRAVENOUS

## 2022-02-08 MED ORDER — ORAL CARE MOUTH RINSE
15.0000 mL | OROMUCOSAL | Status: DC
  Administered 2022-02-08 – 2022-02-10 (×19): 15 mL via OROMUCOSAL

## 2022-02-08 MED ORDER — DOCUSATE SODIUM 100 MG PO CAPS: 100.0000 mg | ORAL_CAPSULE | Freq: Two times a day (BID) | ORAL | Status: DC | PRN

## 2022-02-08 MED ORDER — ORAL CARE MOUTH RINSE: 15.0000 mL | OROMUCOSAL | Status: DC | PRN

## 2022-02-08 MED ORDER — METHYLPREDNISOLONE SODIUM SUCC 40 MG IJ SOLR
40.0000 mg | Freq: Two times a day (BID) | INTRAMUSCULAR | Status: DC
  Administered 2022-02-08 – 2022-02-13 (×10): 40 mg via INTRAVENOUS
  Filled 2022-02-08 (×10): qty 1

## 2022-02-08 MED ORDER — SODIUM CHLORIDE 0.9 % IV SOLN: 250.0000 mL | INTRAVENOUS | Status: DC

## 2022-02-08 MED ORDER — SODIUM CHLORIDE 0.9% FLUSH: 10.0000 mL | INTRAVENOUS | Status: DC | PRN

## 2022-02-08 MED ORDER — FENTANYL CITRATE PF 50 MCG/ML IJ SOSY
PREFILLED_SYRINGE | INTRAMUSCULAR | Status: AC
  Administered 2022-02-08: 50 ug via INTRAVENOUS
  Filled 2022-02-08: qty 2

## 2022-02-08 MED ORDER — CHLORHEXIDINE GLUCONATE CLOTH 2 % EX PADS
6.0000 | MEDICATED_PAD | Freq: Every day | CUTANEOUS | Status: DC
  Administered 2022-02-08 – 2022-02-13 (×6): 6 via TOPICAL

## 2022-02-08 MED ORDER — ETOMIDATE 2 MG/ML IV SOLN
INTRAVENOUS | Status: DC | PRN
  Administered 2022-02-08: 20 mg via INTRAVENOUS

## 2022-02-08 MED ORDER — ROCURONIUM BROMIDE 10 MG/ML (PF) SYRINGE
PREFILLED_SYRINGE | INTRAVENOUS | Status: AC
  Administered 2022-02-08: 60 mg via INTRAVENOUS
  Filled 2022-02-08: qty 10

## 2022-02-08 MED ORDER — NOREPINEPHRINE 4 MG/250ML-% IV SOLN: 2.0000 ug/min | INTRAVENOUS | Status: DC

## 2022-02-08 MED ORDER — SODIUM CHLORIDE 0.9 % IV BOLUS
1000.0000 mL | Freq: Once | INTRAVENOUS | Status: AC
  Administered 2022-02-08: 1000 mL via INTRAVENOUS

## 2022-02-08 MED ORDER — SODIUM CHLORIDE 0.9 % IV SOLN
500.0000 mg | INTRAVENOUS | Status: AC
  Administered 2022-02-08 – 2022-02-12 (×5): 500 mg via INTRAVENOUS
  Filled 2022-02-08 (×6): qty 5

## 2022-02-08 MED ORDER — SODIUM CHLORIDE 0.9% FLUSH
10.0000 mL | Freq: Two times a day (BID) | INTRAVENOUS | Status: DC
  Administered 2022-02-09 – 2022-02-14 (×10): 10 mL

## 2022-02-08 MED ORDER — NOREPINEPHRINE 4 MG/250ML-% IV SOLN
0.0000 ug/min | INTRAVENOUS | Status: DC
  Administered 2022-02-08: 10 ug/min via INTRAVENOUS
  Administered 2022-02-08 (×2): 15 ug/min via INTRAVENOUS
  Administered 2022-02-08: 10 ug/min via INTRAVENOUS
  Administered 2022-02-09: 8 ug/min via INTRAVENOUS
  Administered 2022-02-09: 13 ug/min via INTRAVENOUS
  Administered 2022-02-09: 14 ug/min via INTRAVENOUS
  Filled 2022-02-08 (×6): qty 250

## 2022-02-08 MED ORDER — SODIUM CHLORIDE 0.9 % IV SOLN: INTRAVENOUS | Status: DC

## 2022-02-08 MED ORDER — FENTANYL CITRATE PF 50 MCG/ML IJ SOSY: 25.0000 ug | PREFILLED_SYRINGE | INTRAMUSCULAR | Status: DC | PRN

## 2022-02-08 MED ORDER — SODIUM CHLORIDE 0.9 % IV SOLN
2.0000 g | INTRAVENOUS | Status: AC
  Administered 2022-02-08 – 2022-02-12 (×5): 2 g via INTRAVENOUS
  Filled 2022-02-08 (×5): qty 20

## 2022-02-08 MED ORDER — SUCCINYLCHOLINE CHLORIDE 200 MG/10ML IV SOSY
PREFILLED_SYRINGE | INTRAVENOUS | Status: AC
  Filled 2022-02-08: qty 10

## 2022-02-08 MED ORDER — LACTATED RINGERS IV BOLUS
250.0000 mL | Freq: Once | INTRAVENOUS | Status: AC
  Administered 2022-02-08: 250 mL via INTRAVENOUS

## 2022-02-08 MED ORDER — PROPOFOL 1000 MG/100ML IV EMUL
0.0000 ug/kg/min | INTRAVENOUS | Status: DC
  Administered 2022-02-08: 5 ug/kg/min via INTRAVENOUS
  Administered 2022-02-09: 15 ug/kg/min via INTRAVENOUS
  Administered 2022-02-09: 10 ug/kg/min via INTRAVENOUS
  Administered 2022-02-10: 15 ug/kg/min via INTRAVENOUS
  Filled 2022-02-08 (×4): qty 100

## 2022-02-08 MED ORDER — CHLORHEXIDINE GLUCONATE CLOTH 2 % EX PADS: 6.0000 | MEDICATED_PAD | Freq: Every day | CUTANEOUS | Status: DC

## 2022-02-08 MED ORDER — MIDAZOLAM HCL 2 MG/2ML IJ SOLN
INTRAMUSCULAR | Status: AC
  Filled 2022-02-08: qty 2

## 2022-02-08 MED ORDER — ETOMIDATE 2 MG/ML IV SOLN
INTRAVENOUS | Status: AC
  Administered 2022-02-08: 20 mg via INTRAVENOUS
  Filled 2022-02-08: qty 20

## 2022-02-08 MED ORDER — MIDAZOLAM HCL 2 MG/2ML IJ SOLN
1.0000 mg | INTRAMUSCULAR | Status: DC | PRN
  Administered 2022-02-08: 2 mg via INTRAVENOUS
  Filled 2022-02-08 (×2): qty 2

## 2022-02-08 MED ORDER — FENTANYL CITRATE PF 50 MCG/ML IJ SOSY
25.0000 ug | PREFILLED_SYRINGE | INTRAMUSCULAR | Status: DC | PRN
  Filled 2022-02-08 (×2): qty 1

## 2022-02-08 MED ORDER — LACTATED RINGERS IV BOLUS
500.0000 mL | Freq: Once | INTRAVENOUS | Status: AC
  Administered 2022-02-08: 500 mL via INTRAVENOUS

## 2022-02-08 MED ORDER — POLYETHYLENE GLYCOL 3350 17 G PO PACK: 17.0000 g | PACK | Freq: Every day | ORAL | Status: DC | PRN

## 2022-02-08 MED ORDER — HEPARIN SODIUM (PORCINE) 5000 UNIT/ML IJ SOLN
5000.0000 [IU] | Freq: Three times a day (TID) | INTRAMUSCULAR | Status: DC
  Administered 2022-02-08 – 2022-02-14 (×17): 5000 [IU] via SUBCUTANEOUS
  Filled 2022-02-08 (×16): qty 1

## 2022-02-08 MED ORDER — PANTOPRAZOLE SODIUM 40 MG PO TBEC: 40.0000 mg | DELAYED_RELEASE_TABLET | Freq: Every day | ORAL | Status: DC

## 2022-02-08 NOTE — Telephone Encounter (Signed)
Pt wife called and stated he will not be able to attend PR today due to hospitalized.

## 2022-02-08 NOTE — ED Notes (Signed)
MD notified of critical Sodium 119

## 2022-02-08 NOTE — Progress Notes (Signed)
Vent patient transported from ED30 to 2M10 without any issues.

## 2022-02-08 NOTE — Progress Notes (Signed)
Vent pt transported from ED30 to CT2 and back w/o any issues.

## 2022-02-08 NOTE — IPAL (Signed)
  Interdisciplinary Goals of Care Family Meeting   Date carried out: 02/08/2022  Location of the meeting: Bedside  Member's involved: Physician and Family Member or next of kin  Durable Power of Attorney or acting medical decision maker: Ernst Breach  Discussion: We discussed goals of care for [employer] .  We discussed his progressive decline since earlier this year and the concern for progressive pulmonary fibrosis. He is currently intubated and we discussed the need for CPR if his heart were to stop. We discussed how he would have more medical issues after a code situation and he would not likely survive given his chronic respiratory failure. She discussed he would not want CPR. We will change his code status to DNR. We will continue current aggressive management  Code status:   Code Status: Prior   Disposition: Continue current acute care  Time spent for the meeting: 15 minutes    Freddi Starr, MD  02/08/2022, 12:04 PM

## 2022-02-08 NOTE — Progress Notes (Signed)
Notified provider and bedside nurse of need to order and administer fluid bolus, pt needs 1797 cc.

## 2022-02-08 NOTE — ED Triage Notes (Signed)
Pt BIB EMS from home for tachypnea and mental status changes. Normally Aox4, but not able to speak or follow commands. LKW 1200am 12/21. Shallow breathing, EMS RA sats 66%.

## 2022-02-08 NOTE — Progress Notes (Signed)
Elink monitoring code sepsis 

## 2022-02-08 NOTE — ED Provider Notes (Signed)
[facility] EMERGENCY DEPARTMENT Provider Note   CSN: [id number] Arrival date & time: 02/08/22  0830     History  Chief Complaint  Patient presents with   Hyperventilating   Altered Mental Status    Roberto Ponce is a 71 y.o. male.  HPI 71 year old male with a history of pulmonary fibrosis on chronic oxygen presents with altered mental status and hypoxia.  History is initially from EMS.  Patient had a fall and head injury last week, was seen in the ED and discharged.  He was reportedly last normal last night at midnight.  This morning has been altered.  Was initially following commands with EMS.  His initial O2 sat was 66% and has been on a nonrebreather.  He had weak radial pulses and BP in the 90s.  Started on a small bolus of fluids.  Otherwise history is unavailable.  Home Medications Prior to Admission medications   Medication Sig Start Date End Date Taking? Authorizing Provider  acetaminophen (TYLENOL) 500 MG tablet Take 1,000 mg by mouth every 6 (six) hours as needed for moderate pain.    [provider]  aspirin EC 81 MG tablet Take 1 tablet (81 mg total) by mouth daily. Swallow whole. 08/23/21   Sherren Mocha, MD  brimonidine [facility]) 0.2 % ophthalmic solution Place 1 drop into the left eye 2 (two) times daily. 12/19/20   [provider]  carvedilol (COREG) 3.125 MG tablet Take 1 tablet (3.125 mg total) by mouth 2 (two) times daily with a meal. 10/06/21   Chandrasekhar, Mahesh A, MD  cyanocobalamin 1000 MCG tablet Take 1 tablet (1,000 mcg total) by mouth daily. 10/02/21   Geradine Girt, DO  dapagliflozin propanediol (FARXIGA) 10 MG TABS tablet Take 1 tablet (10 mg total) by mouth daily. 10/06/21   Chandrasekhar, Lyda Kalata A, MD  dorzolamide-timolol (COSOPT) 22.3-6.8 MG/ML ophthalmic solution Place 1 drop into both eyes 2 (two) times daily.    [provider]  ezetimibe (ZETIA) 10 MG tablet TAKE 1 TABLET(10 MG) BY MOUTH DAILY  04/16/19   Nahser, Wonda Cheng, MD  feeding supplement (ENSURE ENLIVE / ENSURE PLUS) LIQD Take 237 mLs by mouth 2 (two) times daily between meals. 07/12/21   Mercy Riding, MD  furosemide (LASIX) 40 MG tablet Take 1 tablet by mouth on Mondays, Wednesdays, and Fridays only Patient not taking: Reported on 02/01/2022 08/25/21   Nahser, Wonda Cheng, MD  latanoprost (XALATAN) 0.005 % ophthalmic solution Place 1 drop into both eyes at bedtime.  05/27/13   [provider]  magic mouthwash (nystatin, lidocaine, diphenhydrAMINE) suspension Take 5 mLs by mouth 3 (three) times daily as needed for mouth pain. 09/01/21   Freddi Starr, MD  nitroGLYCERIN (NITROSTAT) 0.4 MG SL tablet Place 1 tablet (0.4 mg total) under the tongue every 5 (five) minutes as needed for chest pain. 02/15/21 02/15/22  Sande Rives E, PA-C  OXYGEN Inhale 3 L into the lungs continuous.    [provider]  pantoprazole (PROTONIX) 40 MG tablet 40 mg BID x 30 days then once daily after 10/01/21   Eulogio Bear U, DO  SODIUM FLUORIDE 5000 PPM 1.1 % PSTE Apply 1 application. topically in the morning and at bedtime. 05/17/21   [provider]  spironolactone (ALDACTONE) 25 MG tablet TAKE 1/2 TABLET(12.5 MG) BY MOUTH DAILY 12/29/21   Chandrasekhar, Mahesh A, MD  triamcinolone cream (KENALOG) 0.1 % Apply 1 application topically 2 (two) times daily as needed (eczema).  [provider]      Allergies    Patient has no known allergies.    Review of Systems   Review of Systems  Unable to perform ROS: Mental status change    Physical Exam Updated Vital Signs BP (!) 85/63   Pulse 73   Temp (!) 90 F (32.2 C) (Rectal)   Resp (!) 22   Ht '5\' 9"'$  (1.753 m)   Wt 59.9 kg   SpO2 100%   BMI 19.49 kg/m  Physical Exam Vitals and nursing note reviewed.  Constitutional:      Appearance: He is well-developed. He is ill-appearing.  HENT:     Head: Normocephalic and atraumatic.  Cardiovascular:     Rate and  Rhythm: Normal rate and regular rhythm.     Heart sounds: Normal heart sounds.  Pulmonary:     Breath sounds: Rales (left sided) present.     Comments: Patient is on nonrebreather.  He is breathing on his own but taking shallow, inadequate breaths. Abdominal:     Palpations: Abdomen is soft.     Tenderness: There is no abdominal tenderness.  Skin:    General: Skin is warm.  Neurological:     Mental Status: He is unresponsive.     GCS: GCS eye subscore is 1. GCS verbal subscore is 1. GCS motor subscore is 1.     Comments: Eyes are open but do not track or follow.  Otherwise, he is unresponsive to pain but does not follow commands.     ED Results / Procedures / Treatments   Labs (all labs ordered are listed, but only abnormal results are displayed) Labs Reviewed  CBC WITH DIFFERENTIAL/PLATELET - Abnormal; Notable for the following components:      Result Value   RDW 16.7 (*)    All other components within normal limits  COMPREHENSIVE METABOLIC PANEL - Abnormal; Notable for the following components:   Sodium 119 (*)    Potassium 5.9 (*)    Chloride 75 (*)    CO2 36 (*)    Glucose, Bld 152 (*)    Creatinine, Ser 0.40 (*)    Albumin 3.4 (*)    All other components within normal limits  BRAIN NATRIURETIC PEPTIDE - Abnormal; Notable for the following components:   B Natriuretic Peptide 635.1 (*)    All other components within normal limits  I-STAT ARTERIAL BLOOD GAS, ED - Abnormal; Notable for the following components:   pH, Arterial 7.244 (*)    pCO2 arterial 83.8 (*)    pO2, Arterial 628 (*)    Bicarbonate 36.3 (*)    TCO2 39 (*)    Acid-Base Excess 6.0 (*)    Sodium 120 (*)    HCT 36.0 (*)    Hemoglobin 12.2 (*)    All other components within normal limits  CBG MONITORING, ED - Abnormal; Notable for the following components:   Glucose-Capillary 138 (*)    All other components within normal limits  TROPONIN I (HIGH SENSITIVITY) - Abnormal; Notable for the following  components:   Troponin I (High Sensitivity) 32 (*)    All other components within normal limits  CULTURE, BLOOD (ROUTINE X 2)  CULTURE, BLOOD (ROUTINE X 2)  URINE CULTURE  RESP PANEL BY RT-PCR (RSV, FLU A&B, COVID)  RVPGX2  LACTIC ACID, PLASMA  LACTIC ACID, PLASMA  ETHANOL  URINALYSIS, ROUTINE W REFLEX MICROSCOPIC  RAPID URINE DRUG SCREEN, HOSP PERFORMED  AMMONIA  I-STAT CHEM 8, ED  I-STAT  VENOUS BLOOD GAS, ED  TROPONIN I (HIGH SENSITIVITY)    EKG None  Radiology CT Head Wo Contrast  Result Date: 02/08/2022 CLINICAL DATA:  Mental status change EXAM: CT HEAD WITHOUT CONTRAST TECHNIQUE: Contiguous axial images were obtained from the base of the skull through the vertex without intravenous contrast. RADIATION DOSE REDUCTION: This exam was performed according to the departmental dose-optimization program which includes automated exposure control, adjustment of the mA and/or kV according to patient size and/or use of iterative reconstruction technique. COMPARISON:  CT head 02/01/2022 FINDINGS: Brain: Mild atrophy.  Negative for acute infarct, hemorrhage, mass Vascular: Negative for hyperdense vessel Skull: Negative Sinuses/Orbits: Mild mucosal edema paranasal sinuses. Patient is intubated. NG tube in place. No orbital lesion. Other: None IMPRESSION: Mild atrophy. No acute abnormality. Electronically Signed   By: Franchot Gallo M.D.   On: 02/08/2022 09:36   DG Chest Portable 1 View  Result Date: 02/08/2022 CLINICAL DATA:  Altered mental status EXAM: PORTABLE CHEST 1 VIEW COMPARISON:  09/28/2021 FINDINGS: Endotracheal tube 3.5 cm from carina. NG tube extends into a hiatal hernia above the LEFT hemidiaphragm. There is patchy airspace disease in LEFT lung. RIGHT lung clear. No pneumothorax IMPRESSION: 1. Patchy airspace disease in the LEFT lung representing pneumonia versus asymmetric edema. 2. Endotracheal tube in good position. 3. NG tube in herniated stomach Electronically Signed   By:  Suzy Bouchard M.D.   On: 02/08/2022 09:14    Procedures .Critical Care  Performed by: Sherwood Gambler, MD Authorized by: Sherwood Gambler, MD   Critical care provider statement:    Critical care time (minutes):  50   Critical care time was exclusive of:  Separately billable procedures and treating other patients   Critical care was necessary to treat or prevent imminent or life-threatening deterioration of the following conditions:  Sepsis and respiratory failure   Critical care was time spent personally by me on the following activities:  Development of treatment plan with patient or surrogate, discussions with consultants, evaluation of patient's response to treatment, examination of patient, ordering and review of laboratory studies, ordering and review of radiographic studies, ordering and performing treatments and interventions, pulse oximetry, re-evaluation of patient's condition, review of old charts and ventilator management Procedure Name: Intubation Date/Time: 02/08/2022 10:31 AM  Performed by: Sherwood Gambler, MDPre-anesthesia Checklist: Patient identified, Patient being monitored, Emergency Drugs available, Timeout performed and Suction available Oxygen Delivery Method: Non-rebreather mask Preoxygenation: Pre-oxygenation with 100% oxygen Induction Type: Rapid sequence Ventilation: Mask ventilation without difficulty Laryngoscope Size: Glidescope and 3 Grade View: Grade I Tube size: 7.5 mm Number of attempts: 1 Airway Equipment and Method: Video-laryngoscopy Placement Confirmation: ETT inserted through vocal cords under direct vision, CO2 detector and Breath sounds checked- equal and bilateral Secured at: 23 cm Tube secured with: ETT holder Dental Injury: Teeth and Oropharynx as per pre-operative assessment         Medications Ordered in ED Medications  rocuronium bromide 100 MG/10ML SOSY (has no administration in time range)  etomidate (AMIDATE) 2 MG/ML injection  (has no administration in time range)  etomidate (AMIDATE) injection (20 mg Intravenous Given 02/08/22 0845)  rocuronium (ZEMURON) injection (60 mg Intravenous Given 02/08/22 0845)  norepinephrine (LEVOPHED) '4mg'$  in 242m (0.016 mg/mL) premix infusion (2 mcg/min Intravenous Rate/Dose Change 02/08/22 0943)  cefTRIAXone (ROCEPHIN) 2 g in sodium chloride 0.9 % 100 mL IVPB (2 g Intravenous New Bag/Given 02/08/22 0930)  azithromycin (ZITHROMAX) 500 mg in sodium chloride 0.9 % 250 mL IVPB (has no administration in time  range)  fentaNYL (SUBLIMAZE) injection 25 mcg (has no administration in time range)  fentaNYL (SUBLIMAZE) injection 25-100 mcg (50 mcg Intravenous Given 02/08/22 0945)  midazolam (VERSED) injection 1-2 mg (2 mg Intravenous Given 02/08/22 0944)  lactated ringers bolus 500 mL (has no administration in time range)  lactated ringers bolus 1,000 mL (1,000 mLs Intravenous New Bag/Given 02/08/22 8768)    ED Course/ Medical Decision Making/ A&P Clinical Course as of 02/08/22 1034  Thu Feb 08, 2022  0918 Wife and son have arrived.  I briefly discussed the presentation and his critical illness.  She notes that he was talking to her last night.  He has had some mild slurred speech since the fall and head injury last week.  He was also told his sodium was low last week.  He has pulmonary fibrosis and chronically has abnormal left lung. [SG]    Clinical Course User Index [SG] Sherwood Gambler, MD                           Medical Decision Making Amount and/or Complexity of Data Reviewed External Data Reviewed: notes. Labs: ordered.    Details: Sodium of 119.  Respiratory acidosis. Radiology: ordered and independent interpretation performed.    Details: Chest x-ray with chronic pulmonary fibrosis but also superimposed pneumonia on the left side.  CT head without head bleed. ECG/medicine tests: ordered and independent interpretation performed.  Risk Prescription drug management. Decision  regarding hospitalization.   Patient brought in by EMS.  He is cool to the touch and breathing on his own but is otherwise GCS 3.  He does not appear to be ventilating well and thus was intubated for airway protection.  Initially had good blood pressures but after intubation he suffered from severe hypotension.  At 1 point his BP was in the 40s.  Never lost pulses.  Was put on fluids and norepinephrine.  Blood pressures did come up and we were able to wean these down but he has still not come off.  He will be given's more fluids.  His 30 cc/KG for sepsis is 2250.  He otherwise has multiple possible pathologies for altered mental status which includes his respiratory acidosis, severe hypoxia, pneumonia, and he has hyponatremia.  I think hyponatremia is less likely the cause as the sodium is only a couple points lower than when he was here last week.  Head CT imaging shows no head bleed.  ICU was consulted for admission.  He was given Rocephin and azithromycin for pneumonia.  I have updated the family multiple times. He was put on a Retail banker for hypothermia (32 C). Admit to critical care in critical condition.        Final Clinical Impression(s) / ED Diagnoses Final diagnoses:  Acute respiratory failure with hypoxia and hypercapnia (HCC)  Pneumonia of left lung due to infectious organism, unspecified part of lung  Hyponatremia  Hypothermia, initial encounter    Rx / DC Orders ED Discharge Orders     None         Sherwood Gambler, MD 02/08/22 1045

## 2022-02-08 NOTE — H&P (Addendum)
NAME:  Roberto Ponce, MRN:  [id number], DOB:  1950/04/13, LOS: 0 ADMISSION DATE:  02/08/2022, CONSULTATION DATE: 02/08/2022 REFERRING MD: Emergency department physician, CHIEF COMPLAINT: Acute respiratory failure, altered mental status metabolic disarray.  History of Present Illness:  71 year old male with extensive past medical history is well-documented below who was recently seen in the emergency room for a fall with a negative CT of the head.  He was sent home and returns with altered mental status respiratory distress negative CT of the head and required intubation for airway protection and oxygenation.  His past medical history significant for Boop, interstitial pulmonary fibrosis with progression and he is followed by Dr. Madie Reno.  He is currently intubated and hypotensive of note he is on Aldactone and Lasix as an outpatient along with antihypertensives.  He has been fluid resuscitated to 2.5 L and is currently on Levophed drip.  He is also remarkable for being hyponatremic.  We will continue fluid resuscitation admit to the intensive care unit  Pertinent  Medical History   Past Medical History:  Diagnosis Date   Asthma    BOOP (bronchiolitis obliterans with organizing pneumonia) ([place])    Chronic respiratory failure ([place])    Coronary artery disease    DVT (deep venous thrombosis) (HCC)    Glaucoma    Heart failure with mid-range ejection fraction ([place])    History of gallstones    Hyperlipidemia    Hyponatremia    Hypotension due to medication 06/22/2021   LVH (left ventricular hypertrophy)    MI (myocardial infarction) ([place])    Mitral regurgitation    Mixed dyslipidemia    Pneumomediastinum ([place])    Protein calorie malnutrition ([place])    Shock ([place]) 06/22/2021   Testicular cancer ([place])    Venous stasis    Significant Hospital Events: Including procedures, antibiotic start and stop dates in addition to other pertinent events   02/08/2022 CT of the head was  negative 02/08/2022 intubated  Interim History / Subjective:  He been acute respiratory distress altered mental status required intubation  Objective   Blood pressure (!) 85/63, pulse 73, temperature (!) 90 F (32.2 C), temperature source Rectal, resp. rate (!) 22, height '5\' 9"'$  (1.753 m), weight 59.9 kg, SpO2 100 %.    Vent Mode: PRVC FiO2 (%):  [40 %-100 %] 100 % Set Rate:  [24 bmp] 24 bmp Vt Set:  [560 mL] 560 mL PEEP:  [5 cmH20] 5 cmH20 Plateau Pressure:  [31 cmH20] 31 cmH20  No intake or output data in the 24 hours ending 02/08/22 1046 Filed Weights   02/08/22 0954  Weight: 59.9 kg    Examination: General: Elderly male who is currently on full mechanical dilatory support and sedated HENT: No JVD is appreciated Lungs: Coarse rhonchi bilaterally Cardiovascular: Heart sounds are regular.  Blood pressure 86/53 Abdomen: Soft nontender positive bowel sounds Extremities: 1+ edema Neuro: Recently sedated sedated and intubated GU: Amber urine  Resolved Hospital Problem list     Assessment & Plan:  Ventilator dependent respiratory failure in the setting of progressive interstitial pulmonary fibrosis, advancing age, questionable pneumonia complicated by hypotension and possible sepsis.  Noted to have a history of Boop. Adjust ventilator as needed Arterial blood gas Plus or minus arterial line Empirical antimicrobial therapy Questionable role for steroids  Hypotension in the setting of possible pneumonia, electrolyte imbalance suspected sepsis from pneumonia. Peripheral vasopressors Levophed  fluid resuscitation Stop diuretics No reason to entertain adrenal insufficiency.  Hyponatremia Continue to monitor  Normal saline for fluid resuscitation  71 year old history of coronary artery disease status post coronary bypass graft status post stent. Monitor intensive care unit To be fluid resuscitation but monitor for fluid overload   Best Practice (right click and "Reselect  all SmartList Selections" daily)   Diet/type: NPO DVT prophylaxis: prophylactic heparin  GI prophylaxis: PPI Lines: N/A Foley:  N/A Code Status:  full code Last date of multidisciplinary goals of care discussion [TBD] Spoke with son and daughter at bedside currently full code at this time. Labs   CBC: Recent Labs  Lab 02/01/22 1754 02/08/22 0844 02/08/22 0914  WBC 4.7 6.5  --   NEUTROABS 2.6 4.2  --   HGB 13.2 13.4 12.2*  HCT 39.7 41.3 36.0*  MCV 79.4* 81.9  --   PLT 226 201  --     Basic Metabolic Panel: Recent Labs  Lab 02/01/22 1754 02/08/22 0844 02/08/22 0914  NA 121* 119* 120*  K 5.5* 5.9* 5.1  CL 76* 75*  --   CO2 33* 36*  --   GLUCOSE 96 152*  --   BUN 10 14  --   CREATININE 0.54* 0.40*  --   CALCIUM 9.0 9.2  --    GFR: Estimated Creatinine Clearance: 72.8 mL/min (A) (by C-G formula based on SCr of 0.4 mg/dL (L)). Recent Labs  Lab 02/01/22 1754 02/08/22 0844  WBC 4.7 6.5  LATICACIDVEN  --  0.7    Liver Function Tests: Recent Labs  Lab 02/01/22 1754 02/08/22 0844  AST 23 25  ALT 14 18  ALKPHOS 70 71  BILITOT 0.1* 0.5  PROT 7.2 7.1  ALBUMIN 3.4* 3.4*   No results for input(s): "LIPASE", "AMYLASE" in the last 168 hours. No results for input(s): "AMMONIA" in the last 168 hours.  ABG    Component Value Date/Time   PHART 7.244 (L) 02/08/2022 0914   PCO2ART 83.8 (HH) 02/08/2022 0914   PO2ART 628 (H) 02/08/2022 0914   HCO3 36.3 (H) 02/08/2022 0914   TCO2 39 (H) 02/08/2022 0914   ACIDBASEDEF 0.1 06/20/2021 0200   O2SAT 100 02/08/2022 0914     Coagulation Profile: Recent Labs  Lab 02/01/22 1754  INR 1.0    Cardiac Enzymes: No results for input(s): "CKTOTAL", "CKMB", "CKMBINDEX", "TROPONINI" in the last 168 hours.  HbA1C: Hgb A1c MFr Bld  Date/Time Value Ref Range Status  06/20/2021 08:03 PM 5.4 4.8 - 5.6 % Final    Comment:    (NOTE) Pre diabetes:          5.7%-6.4%  Diabetes:              >6.4%  Glycemic control for    <7.0% adults with diabetes     CBG: Recent Labs  Lab 02/08/22 0844  GLUCAP 138*    Review of Systems:   na  Past Medical History:  He,  has a past medical history of Asthma, BOOP (bronchiolitis obliterans with organizing pneumonia) ([place]), Chronic respiratory failure ([place]), Coronary artery disease, DVT (deep venous thrombosis) ([place]), Glaucoma, Heart failure with mid-range ejection fraction ([place]), History of gallstones, Hyperlipidemia, Hyponatremia, Hypotension due to medication (06/22/2021), LVH (left ventricular hypertrophy), MI (myocardial infarction) ([place]), Mitral regurgitation, Mixed dyslipidemia, Pneumomediastinum ([place]), Protein calorie malnutrition ([place]), Shock ([place]) (06/22/2021), Testicular cancer ([place]), and Venous stasis.   Surgical History:   Past Surgical History:  Procedure Laterality Date   CARDIAC CATHETERIZATION  09/09/1986   EF 56%   CARDIOVASCULAR STRESS TEST  12/16/2007   EF 54%  NO ISCHEMIA   CHOLECYSTECTOMY     COLONOSCOPY WITH PROPOFOL N/A 09/30/2021   Procedure: COLONOSCOPY WITH PROPOFOL;  Surgeon: Otis Brace, MD;  Location: [facility];  Service: Gastroenterology;  Laterality: N/A;   CORONARY ARTERY BYPASS GRAFT     LIMA TO THE LAD AND RIMA TO RIGHT CORONARY ARTERY   CORONARY STENT INTERVENTION N/A 02/14/2021   Procedure: CORONARY STENT INTERVENTION;  Surgeon: Sherren Mocha, MD;  Location: [facility];  Service: Cardiovascular;  Laterality: N/A;   ESOPHAGOGASTRODUODENOSCOPY (EGD) WITH PROPOFOL N/A 09/30/2021   Procedure: ESOPHAGOGASTRODUODENOSCOPY (EGD) WITH PROPOFOL;  Surgeon: Otis Brace, MD;  Location: [facility];  Service: Gastroenterology;  Laterality: N/A;   ORCHIECTOMY Right 04/19/2021   Procedure: RIGHT RADICAL INGUINAL ORCHIECTOMY;  Surgeon: Lucas Mallow, MD;  Location: [facility];  Service: Urology;  Laterality: Right;  1 HR FOR CASE   RIGHT/LEFT HEART CATH AND CORONARY/GRAFT ANGIOGRAPHY N/A 02/14/2021   Procedure: RIGHT/LEFT  HEART CATH AND CORONARY/GRAFT ANGIOGRAPHY;  Surgeon: Sherren Mocha, MD;  Location: [facility];  Service: Cardiovascular;  Laterality: N/A;   TEE WITHOUT CARDIOVERSION N/A 09/02/2020   Procedure: TRANSESOPHAGEAL ECHOCARDIOGRAM (TEE);  Surgeon: Werner Lean, MD;  Location[facility];  Service: Cardiovascular;  Laterality: N/A;   US ECHOCARDIOGRAPHY  12/12/2007   EF 55-60%   VASECTOMY  1988     Social History:   reports that he has never smoked. He has never used smokeless tobacco. He reports that he does not drink alcohol and does not use drugs.   Family History:  His family history includes Heart attack in his father; Stroke in his father.   Allergies No Known Allergies   Home Medications  Prior to Admission medications   Medication Sig Start Date End Date Taking? Authorizing Provider  acetaminophen (TYLENOL) 500 MG tablet Take 1,000 mg by mouth every 6 (six) hours as needed for moderate pain.    [provider]  aspirin EC 81 MG tablet Take 1 tablet (81 mg total) by mouth daily. Swallow whole. 08/23/21   Sherren Mocha, MD  brimonidine [facility]) 0.2 % ophthalmic solution Place 1 drop into the left eye 2 (two) times daily. 12/19/20   [provider]  carvedilol (COREG) 3.125 MG tablet Take 1 tablet (3.125 mg total) by mouth 2 (two) times daily with a meal. 10/06/21   Chandrasekhar, Mahesh A, MD  cyanocobalamin 1000 MCG tablet Take 1 tablet (1,000 mcg total) by mouth daily. 10/02/21   Geradine Girt, DO  dapagliflozin propanediol (FARXIGA) 10 MG TABS tablet Take 1 tablet (10 mg total) by mouth daily. 10/06/21   Chandrasekhar, Lyda Kalata A, MD  dorzolamide-timolol (COSOPT) 22.3-6.8 MG/ML ophthalmic solution Place 1 drop into both eyes 2 (two) times daily.    [provider]  ezetimibe (ZETIA) 10 MG tablet TAKE 1 TABLET(10 MG) BY MOUTH DAILY 04/16/19   Nahser, Wonda Cheng, MD  feeding supplement (ENSURE ENLIVE / ENSURE PLUS) LIQD Take 237 mLs by mouth  2 (two) times daily between meals. 07/12/21   Mercy Riding, MD  furosemide (LASIX) 40 MG tablet Take 1 tablet by mouth on Mondays, Wednesdays, and Fridays only Patient not taking: Reported on 02/01/2022 08/25/21   Nahser, Wonda Cheng, MD  latanoprost (XALATAN) 0.005 % ophthalmic solution Place 1 drop into both eyes at bedtime.  05/27/13   [provider]  magic mouthwash (nystatin, lidocaine, diphenhydrAMINE) suspension Take 5 mLs by mouth 3 (three) times daily as needed for mouth pain. 09/01/21  Freddi Starr, MD  nitroGLYCERIN (NITROSTAT) 0.4 MG SL tablet Place 1 tablet (0.4 mg total) under the tongue every 5 (five) minutes as needed for chest pain. 02/15/21 02/15/22  Sande Rives E, PA-C  OXYGEN Inhale 3 L into the lungs continuous.    [provider]  pantoprazole (PROTONIX) 40 MG tablet 40 mg BID x 30 days then once daily after 10/01/21   Eulogio Bear U, DO  SODIUM FLUORIDE 5000 PPM 1.1 % PSTE Apply 1 application. topically in the morning and at bedtime. 05/17/21   [provider]  spironolactone (ALDACTONE) 25 MG tablet TAKE 1/2 TABLET(12.5 MG) BY MOUTH DAILY 12/29/21   Chandrasekhar, Mahesh A, MD  triamcinolone cream (KENALOG) 0.1 % Apply 1 application topically 2 (two) times daily as needed (eczema).    [provider]     Critical care time: 45 min    Richardson Landry Minor ACNP Acute Care Nurse Practitioner [place] Please consult Amion 02/08/2022, 10:52 AM

## 2022-02-08 NOTE — Progress Notes (Signed)
Peripherally Inserted Central Catheter Placement  The IV Nurse has discussed with the patient and/or persons authorized to consent for the patient, the purpose of this procedure and the potential benefits and risks involved with this procedure.  The benefits include less needle sticks, lab draws from the catheter, and the patient may be discharged home with the catheter. Risks include, but not limited to, infection, bleeding, blood clot (thrombus formation), and puncture of an artery; nerve damage and irregular heartbeat and possibility to perform a PICC exchange if needed/ordered by physician.  Alternatives to this procedure were also discussed.  Bard Power PICC patient education guide, fact sheet on infection prevention and patient information card has been provided to patient /or left at bedside.    PICC Placement Documentation  PICC Triple Lumen 95/28/41 Left Basilic 40 cm 1 cm (Active)  Indication for Insertion or Continuance of Line Vasoactive infusions 02/08/22 1614  Exposed Catheter (cm) 1 cm 02/08/22 1614  Site Assessment Clean, Dry, Intact 02/08/22 1614  Lumen #1 Status Flushed;Saline locked;Blood return noted 02/08/22 1614  Lumen #2 Status Flushed;Saline locked;Blood return noted 02/08/22 1614  Lumen #3 Status Flushed;Saline locked;Blood return noted 02/08/22 1614  Dressing Type Transparent;Securing device 02/08/22 1614  Dressing Status Antimicrobial disc in place;Clean, Dry, Intact 02/08/22 1614  Safety Lock Not Applicable 32/44/01 0272  Line Adjustment (NICU/IV Team Only) No 02/08/22 1614  Dressing Intervention New dressing;Other (Comment) 02/08/22 1614  Dressing Change Due 02/15/22 02/08/22 1614    Patient's spouse, Roberto Ponce, signed PICC consent.   Enos Fling 02/08/2022, 4:15 PM

## 2022-02-08 NOTE — Progress Notes (Signed)
An USGPIV (ultrasound guided PIV) has been placed for short-term vasopressor infusion. A correctly placed ivWatch must be used when administering Vasopressors. Should this treatment be needed beyond 72 hours, central line access should be obtained.  It will be the responsibility of the bedside nurse to follow best practice to prevent extravasations.  HS Nieve Rojero RN 

## 2022-02-09 ENCOUNTER — Inpatient Hospital Stay (HOSPITAL_COMMUNITY): Payer: PPO

## 2022-02-09 DIAGNOSIS — I509 Heart failure, unspecified: Secondary | ICD-10-CM

## 2022-02-09 DIAGNOSIS — R579 Shock, unspecified: Secondary | ICD-10-CM | POA: Diagnosis not present

## 2022-02-09 LAB — BASIC METABOLIC PANEL
Anion gap: 8 (ref 5–15)
Anion gap: 7 (ref 5–15)
BUN: 6 mg/dL — ABNORMAL LOW (ref 8–23)
BUN: 9 mg/dL (ref 8–23)
CO2: 30 mmol/L (ref 22–32)
CO2: 32 mmol/L (ref 22–32)
Calcium: 8 mg/dL — ABNORMAL LOW (ref 8.9–10.3)
Calcium: 8 mg/dL — ABNORMAL LOW (ref 8.9–10.3)
Chloride: 84 mmol/L — ABNORMAL LOW (ref 98–111)
Chloride: 91 mmol/L — ABNORMAL LOW (ref 98–111)
Creatinine, Ser: 0.47 mg/dL — ABNORMAL LOW (ref 0.61–1.24)
Creatinine, Ser: 0.42 mg/dL — ABNORMAL LOW (ref 0.61–1.24)
GFR, Estimated: >60 mL/min (ref >60)
GFR, Estimated: >60 mL/min (ref >60)
Glucose, Bld: 258 mg/dL — ABNORMAL HIGH (ref 70–99)
Glucose, Bld: 127 mg/dL — ABNORMAL HIGH (ref 70–99)
Potassium: 4.1 mmol/L (ref 3.5–5.1)
Potassium: 4.1 mmol/L (ref 3.5–5.1)
Sodium: 122 mmol/L — ABNORMAL LOW (ref 135–145)
Sodium: 130 mmol/L — ABNORMAL LOW (ref 135–145)

## 2022-02-09 LAB — ECHOCARDIOGRAM LIMITED
Area-P 1/2: 3.77 cm2
Height: 69 in
MV M vel: 3.22 m/s
MV Peak grad: 41.5 mmHg
S' Lateral: 3.4 cm
Weight: 2243.4 oz

## 2022-02-09 LAB — POCT I-STAT 7, (LYTES, BLD GAS, ICA,H+H)
Acid-Base Excess: 8 mmol/L — ABNORMAL HIGH (ref 0.0–2.0)
Bicarbonate: 31.7 mmol/L — ABNORMAL HIGH (ref 20.0–28.0)
Calcium, Ion: 1.14 mmol/L — ABNORMAL LOW (ref 1.15–1.40)
HCT: 34 % — ABNORMAL LOW (ref 39.0–52.0)
Hemoglobin: 11.6 g/dL — ABNORMAL LOW (ref 13.0–17.0)
O2 Saturation: 100 %
Patient temperature: 99.6
Potassium: 4.3 mmol/L (ref 3.5–5.1)
Sodium: 122 mmol/L — ABNORMAL LOW (ref 135–145)
TCO2: 33 mmol/L — ABNORMAL HIGH (ref 22–32)
pCO2 arterial: 42.9 mmHg (ref 32–48)
pH, Arterial: 7.479 — ABNORMAL HIGH (ref 7.35–7.45)
pO2, Arterial: 177 mmHg — ABNORMAL HIGH (ref 83–108)

## 2022-02-09 LAB — CBC
HCT: 32.2 % — ABNORMAL LOW (ref 39.0–52.0)
Hemoglobin: 10.7 g/dL — ABNORMAL LOW (ref 13.0–17.0)
MCH: 26.2 pg (ref 26.0–34.0)
MCHC: 33.2 g/dL (ref 30.0–36.0)
MCV: 78.9 fL — ABNORMAL LOW (ref 80.0–100.0)
Platelets: 182 10*3/uL (ref 150–400)
RBC: 4.08 MIL/uL — ABNORMAL LOW (ref 4.22–5.81)
RDW: 17.2 % — ABNORMAL HIGH (ref 11.5–15.5)
WBC: 9.3 10*3/uL (ref 4.0–10.5)
nRBC: 0 % (ref 0.0–0.2)

## 2022-02-09 LAB — GLUCOSE, CAPILLARY
Glucose-Capillary: 117 mg/dL — ABNORMAL HIGH (ref 70–99)
Glucose-Capillary: 118 mg/dL — ABNORMAL HIGH (ref 70–99)
Glucose-Capillary: 129 mg/dL — ABNORMAL HIGH (ref 70–99)
Glucose-Capillary: 130 mg/dL — ABNORMAL HIGH (ref 70–99)
Glucose-Capillary: 138 mg/dL — ABNORMAL HIGH (ref 70–99)
Glucose-Capillary: 146 mg/dL — ABNORMAL HIGH (ref 70–99)

## 2022-02-09 LAB — PHOSPHORUS
Phosphorus: 3.1 mg/dL (ref 2.5–4.6)
Phosphorus: 4.3 mg/dL (ref 2.5–4.6)

## 2022-02-09 LAB — MAGNESIUM
Magnesium: 1.5 mg/dL — ABNORMAL LOW (ref 1.7–2.4)
Magnesium: 2.1 mg/dL (ref 1.7–2.4)

## 2022-02-09 LAB — TRIGLYCERIDES: Triglycerides: 41 mg/dL (ref <150)

## 2022-02-09 MED ORDER — PANTOPRAZOLE SODIUM 40 MG IV SOLR
40.0000 mg | INTRAVENOUS | Status: DC
  Administered 2022-02-09 – 2022-02-11 (×3): 40 mg via INTRAVENOUS
  Filled 2022-02-09 (×3): qty 10

## 2022-02-09 MED ORDER — ADULT MULTIVITAMIN W/MINERALS CH
1.0000 | ORAL_TABLET | Freq: Every day | ORAL | Status: DC
  Administered 2022-02-09 – 2022-02-14 (×6): 1
  Filled 2022-02-09 (×6): qty 1

## 2022-02-09 MED ORDER — PROSOURCE TF20 ENFIT COMPATIBL EN LIQD
60.0000 mL | Freq: Every day | ENTERAL | Status: DC
  Administered 2022-02-09 – 2022-02-10 (×2): 60 mL
  Filled 2022-02-09 (×2): qty 60

## 2022-02-09 MED ORDER — VITAL 1.5 CAL PO LIQD
1000.0000 mL | ORAL | Status: DC
  Administered 2022-02-09: 1000 mL

## 2022-02-09 MED ORDER — BRIMONIDINE TARTRATE 0.2 % OP SOLN
1.0000 [drp] | Freq: Two times a day (BID) | OPHTHALMIC | Status: DC
  Administered 2022-02-09 – 2022-02-14 (×11): 1 [drp] via OPHTHALMIC
  Filled 2022-02-09: qty 5

## 2022-02-09 MED ORDER — DORZOLAMIDE HCL-TIMOLOL MAL 2-0.5 % OP SOLN
1.0000 [drp] | Freq: Two times a day (BID) | OPHTHALMIC | Status: DC
  Administered 2022-02-09 – 2022-02-14 (×11): 1 [drp] via OPHTHALMIC
  Filled 2022-02-09: qty 10

## 2022-02-09 MED ORDER — MAGNESIUM SULFATE 4 GM/100ML IV SOLN
4.0000 g | Freq: Once | INTRAVENOUS | Status: AC
  Administered 2022-02-09: 4 g via INTRAVENOUS
  Filled 2022-02-09: qty 100

## 2022-02-09 MED ORDER — LATANOPROST 0.005 % OP SOLN
1.0000 [drp] | Freq: Every day | OPHTHALMIC | Status: DC
  Administered 2022-02-09 – 2022-02-13 (×5): 1 [drp] via OPHTHALMIC
  Filled 2022-02-09: qty 2.5

## 2022-02-09 MED ORDER — PERFLUTREN LIPID MICROSPHERE
1.0000 mL | INTRAVENOUS | Status: AC | PRN
  Administered 2022-02-09: 2 mL via INTRAVENOUS

## 2022-02-09 NOTE — Progress Notes (Signed)
[facility] ADULT ICU REPLACEMENT PROTOCOL   The patient does apply for the [facility] Adult ICU Electrolyte Replacment Protocol based on the criteria listed below:   1.Exclusion criteria: TCTS, ECMO, Dialysis, and Myasthenia Gravis patients 2. Is GFR >/= 30 ml/min? Yes.    Patient's GFR today is >60 3. Is SCr </= 2? Yes.   Patient's SCr is 0.47 mg/dL 4. Did SCr increase >/= 0.5 in 24 hours? No. 5.Pt's weight >40kg  Yes.   6. Abnormal electrolyte(s): mag 1.5  7. Electrolytes replaced per protocol 8.  Call MD STAT for K+ </= 2.5, Phos </= 1, or Mag </= 1 Physician:  n/a  Darlys Gales 02/09/2022 5:28 AM

## 2022-02-09 NOTE — Progress Notes (Signed)
  Echocardiogram 2D Echocardiogram has been performed.  Roberto Ponce 02/09/2022, 10:53 AM

## 2022-02-09 NOTE — Progress Notes (Signed)
Initial Nutrition Assessment  DOCUMENTATION CODES:   Severe malnutrition in context of chronic illness  INTERVENTION:   Initiate tube feeds via OG tube: - Start Vital 1.5 @ 20 ml/hr and advance by 10 ml q 6 hours to goal rate of 50 ml/hr (1200 ml/day) - PROSource TF20 60 ml daily  Tube feeding regimen at goal rate provides 1880 kcal, 101 grams of protein, and 917 ml of H2O.  Monitor magnesium, potassium, and phosphorus BID for at least 3 days, MD to replete as needed, as pt is at risk for refeeding syndrome given malnutrition.  - MVI with minerals daily per tube  NUTRITION DIAGNOSIS:   Severe Malnutrition related to chronic illness (CHF, pulmonary fibrosis) as evidenced by severe fat depletion, severe muscle depletion, percent weight loss (13.9% weight loss in 7 months).  GOAL:   Patient will meet greater than or equal to 90% of their needs  MONITOR:   Vent status, Labs, Weight trends, TF tolerance  REASON FOR ASSESSMENT:   Ventilator, Consult Enteral/tube feeding initiation and management  ASSESSMENT:   71 year old male who presented to the ED on 12/21 with AMS. Pt required intubation. PMH of BOOP, interstitial pulmonary fibrosis, CAD, DVT, CHF, HLD, malnutrition, testicular cancer.  Consult received for enteral nutrition initiation and management. Pt with OG tube in proximal duodenum per abdominal x-ray yesterday.  Spoke with pt's wife at bedside. She reports that it has been a struggle for pt to eat lately. Pt has not had an appetite since he got sick in May of this year. Pt tires and becomes short of breath easily when eating due to pulmonary fibrosis. Pt has been experiencing early satiety.  Pt's wife shares that they have been working closely with RD at pulmonary/cardiac rehab who has provided them with many ways to increase kcal and protein intake. They have been utilizing these ideas. They have been trying to get most of pt's calories in prior to supper because he  is most tired at that time. Pt typically eats 3 meals and 2 snacks daily. Breakfast is pt's best meal.  Breakfast: large bowl of oatmeal with protein powder, milk, butter, and brown sugar Snack: Ensure Lunch: almond butter sandwich, large sugar cookie Snack: Ensure Dinner: chicken patty on hamburger bun with mashed potatoes  Pt's wife shares that pt used to weigh 160-165 lbs prior to getting sick in May. He now weighs ~128 lbs. Reviewed weight history in chart. Pt with a total weight loss of 10.3 kg since 06/27/21. This is a 13.9% weight loss in 7 months which is severe and significant for timeframe. Pt meets criteria for severe malnutrition.  Noted Palliative Medicine consult pending.  Patient is currently intubated on ventilator support MV: 10.5 L/min Temp (24hrs), Avg:98.5 F (36.9 C), Min:97.1 F (36.2 C), Max:99.6 F (37.6 C) BP (cuff): 115/69 MAP (cuff): 82  Drips: Propofol: 10 mcg/kg/min (provides 95 kcal daily from lipid) Levophed: 8 mcg/min NS: 100 ml/hr  Medications reviewed and include: IV solu-medrol, IV protonix, IV abx, IV magnesium sulfate 4 grams x 1  Labs reviewed: sodium 122, BUN 6, creatinine 0.47, ionized calcium 1.14, magnesium 1.5 CBG's: 83-146 x 24 hours  UOP: 2575 ml x 24 hours I/O's: +1.5 L since admit  NUTRITION - FOCUSED PHYSICAL EXAM:  Flowsheet Row Most Recent Value  Orbital Region Moderate depletion  Upper Arm Region Severe depletion  Thoracic and Lumbar Region Severe depletion  Buccal Region Unable to assess  Temple Region Moderate depletion  Clavicle Bone Region Severe  depletion  Clavicle and Acromion Bone Region Severe depletion  Scapular Bone Region Moderate depletion  Dorsal Hand Moderate depletion  Patellar Region Severe depletion  Anterior Thigh Region Severe depletion  Posterior Calf Region Severe depletion  Edema (RD Assessment) None  Hair Reviewed  Eyes Reviewed  Mouth Reviewed  Skin Reviewed  Nails Reviewed       Diet  Order:   Diet Order             Diet NPO time specified  Diet effective now                   EDUCATION NEEDS:   Education needs have been addressed  Skin:  Skin Assessment: Reviewed RN Assessment  Last BM:  02/08/22  Height:   Ht Readings from Last 1 Encounters:  02/08/22 '5\' 9"'$  (1.753 m)    Weight:   Wt Readings from Last 1 Encounters:  02/09/22 63.6 kg    Ideal Body Weight:  72.7 kg  BMI:  Body mass index is 20.71 kg/m.  Estimated Nutritional Needs:   Kcal:  1850-2050  Protein:  90-110 grams  Fluid:  1.8-2.0 L    Gustavus Bryant, MS, RD, LDN Inpatient Clinical Dietitian Please see AMiON for contact information.

## 2022-02-09 NOTE — Progress Notes (Addendum)
NAME:  Roberto Ponce, MRN:  [id number], DOB:  10-06-1950, LOS: 1 ADMISSION DATE:  02/08/2022, CONSULTATION DATE: 02/08/2022 REFERRING MD: Emergency department physician, CHIEF COMPLAINT: Acute respiratory failure, altered mental status metabolic disarray.  History of Present Illness:  71 year old male with extensive past medical history is well-documented below who was recently seen in the emergency room for a fall with a negative CT of the head.  He was sent home and returns with altered mental status respiratory distress negative CT of the head and required intubation for airway protection and oxygenation.  His past medical history significant for Boop, interstitial pulmonary fibrosis with progression and he is followed by Dr. Madie Reno.  He is currently intubated and hypotensive of note he is on Aldactone and Lasix as an outpatient along with antihypertensives.  He has been fluid resuscitated to 2.5 L and is currently on Levophed drip.  He is also remarkable for being hyponatremic.  We will continue fluid resuscitation admit to the intensive care unit  Pertinent  Medical History   Past Medical History:  Diagnosis Date   Asthma    BOOP (bronchiolitis obliterans with organizing pneumonia) ([place])    Chronic respiratory failure ([place])    Coronary artery disease    DVT (deep venous thrombosis) ([facility])    Glaucoma    Heart failure with mid-range ejection fraction ([place])    History of gallstones    Hyperlipidemia    Hyponatremia    Hypotension due to medication 06/22/2021   LVH (left ventricular hypertrophy)    MI (myocardial infarction) ([place])    Mitral regurgitation    Mixed dyslipidemia    Pneumomediastinum ([place])    Protein calorie malnutrition ([place])    Shock ([place]) 06/22/2021   Testicular cancer ([place])    Venous stasis    Significant Hospital Events: Including procedures, antibiotic start and stop dates in addition to other pertinent events   02/08/2022 CT of the head was  negative 02/08/2022 intubated  Interim History / Subjective:  Patient is intubated. Alert and responsive. Denies any pain. Wife at bedside.   Objective   Blood pressure 124/67, pulse 73, temperature 98.7 F (37.1 C), temperature source Axillary, resp. rate (!) 24, height '5\' 9"'$  (1.753 m), weight 63.6 kg, SpO2 97 %.    Vent Mode: PRVC FiO2 (%):  [40 %-100 %] 40 % Set Rate:  [24 bmp] 24 bmp Vt Set:  [420 mL] 420 mL PEEP:  [5 cmH20-8 cmH20] 8 cmH20 Plateau Pressure:  [23 cmH20-29 cmH20] 27 cmH20   Intake/Output Summary (Last 24 hours) at 02/09/2022 0848 Last data filed at 02/09/2022 0800 Gross per 24 hour  Intake 5021.24 ml  Output 3495 ml  Net 1526.24 ml   Filed Weights   02/08/22 0954 02/09/22 0421  Weight: 59.9 kg 63.6 kg    Examination: General: intubated, in no acute distress HENT: healing abrasion over right forehead Lungs: mechanically ventilated, rhonchi bilaterally  Cardiovascular: RRR Extremities: trace LE edema Neuro: lightly sedated, arousable   Resolved Hospital Problem list     Assessment & Plan:  Ventilator dependent respiratory failure in the setting of progressive interstitial pulmonary fibrosis, advancing age, questionable pneumonia complicated by hypotension and possible sepsis. History of BOOP. Initial ABG 7.244/83.8/628/36.3, today was 7.479/42.9/177/31.7. Negative for influenza and COVID. Lactic acid normal. Pending blood cultures. Likely a chronic CO2 retainer. Patient is alert and responsive while intubated.  -solumedrol 40 mg q12 -azithromycin and rocephin (day 2/5) -PAD protocol with propofol and fentanyl prn   Hypotension  in the setting of possible pneumonia, electrolyte imbalance suspected sepsis from pneumonia. BP better this morning. Still on levophed.  -holding home diuretic and BP meds -electrolyte repletion  -on levophed   Hyponatremia, chronic in setting of SIADH Na 122 from admission 119. Baseline around 130.  -NS at 100  ml/hr  CAD s/p CABG and stent HFrEF -holding home meds for now, consider restart if needed   Best Practice (right click and "Reselect all SmartList Selections" daily)   Diet/type: NPO DVT prophylaxis: prophylactic heparin  GI prophylaxis: PPI Lines: Central line Foley:  Yes, and it is still needed Code Status:  DNR Last date of multidisciplinary goals of care discussion [12/21: see IPAL note, palliative consult]  Labs   CBC: Recent Labs  Lab 02/08/22 0844 02/08/22 0914 02/08/22 1305 02/08/22 1437 02/09/22 0226 02/09/22 0251  WBC 6.5  --   --  8.0  --  9.3  NEUTROABS 4.2  --   --   --   --   --   HGB 13.4 12.2* 13.3 10.6* 11.6* 10.7*  HCT 41.3 36.0* 39.0 31.8* 34.0* 32.2*  MCV 81.9  --   --  80.1  --  78.9*  PLT 201  --   --  158  --  889    Basic Metabolic Panel: Recent Labs  Lab 02/08/22 0844 02/08/22 0914 02/08/22 1305 02/08/22 1437 02/09/22 0226 02/09/22 0251  NA 119* 120* 119*  --  122* 122*  K 5.9* 5.1 4.5  --  4.3 4.1  CL 75*  --   --   --   --  84*  CO2 36*  --   --   --   --  30  GLUCOSE 152*  --   --   --   --  258*  BUN 14  --   --   --   --  6*  CREATININE 0.40*  --   --  0.45*  --  0.47*  CALCIUM 9.2  --   --   --   --  8.0*  MG  --   --   --   --   --  1.5*  PHOS  --   --   --   --   --  3.1   GFR: Estimated Creatinine Clearance: 77.3 mL/min (A) (by C-G formula based on SCr of 0.47 mg/dL (L)). Recent Labs  Lab 02/08/22 0844 02/08/22 1437 02/09/22 0251  PROCALCITON  --  <0.10  --   WBC 6.5 8.0 9.3  LATICACIDVEN 0.7 1.3  --     Liver Function Tests: Recent Labs  Lab 02/08/22 0844  AST 25  ALT 18  ALKPHOS 71  BILITOT 0.5  PROT 7.1  ALBUMIN 3.4*   No results for input(s): "LIPASE", "AMYLASE" in the last 168 hours. Recent Labs  Lab 02/08/22 1437  AMMONIA 36*    ABG    Component Value Date/Time   PHART 7.479 (H) 02/09/2022 0226   PCO2ART 42.9 02/09/2022 0226   PO2ART 177 (H) 02/09/2022 0226   HCO3 31.7 (H) 02/09/2022  0226   TCO2 33 (H) 02/09/2022 0226   ACIDBASEDEF 0.1 06/20/2021 0200   O2SAT 100 02/09/2022 0226     Coagulation Profile: No results for input(s): "INR", "PROTIME" in the last 168 hours.  Cardiac Enzymes: No results for input(s): "CKTOTAL", "CKMB", "CKMBINDEX", "TROPONINI" in the last 168 hours.  HbA1C: Hgb A1c MFr Bld  Date/Time Value Ref Range Status  06/20/2021 08:03 PM 5.4  4.8 - 5.6 % Final    Comment:    (NOTE) Pre diabetes:          5.7%-6.4%  Diabetes:              >6.4%  Glycemic control for   <7.0% adults with diabetes     CBG: Recent Labs  Lab 02/08/22 1523 02/08/22 1943 02/08/22 2331 02/09/22 0334 02/09/22 0755  GLUCAP 91 83 145* 146* 138*   Past Medical History:  He,  has a past medical history of Asthma, BOOP (bronchiolitis obliterans with organizing pneumonia) ([place]), Chronic respiratory failure ([place]), Coronary artery disease, DVT (deep venous thrombosis) ([place]), Glaucoma, Heart failure with mid-range ejection fraction ([place]), History of gallstones, Hyperlipidemia, Hyponatremia, Hypotension due to medication (06/22/2021), LVH (left ventricular hypertrophy), MI (myocardial infarction) ([place]), Mitral regurgitation, Mixed dyslipidemia, Pneumomediastinum ([place]), Protein calorie malnutrition ([place]), Shock ([place]) (06/22/2021), Testicular cancer ([place]), and Venous stasis.   Surgical History:   Past Surgical History:  Procedure Laterality Date   CARDIAC CATHETERIZATION  09/09/1986   EF 56%   CARDIOVASCULAR STRESS TEST  12/16/2007   EF 54% NO ISCHEMIA   CHOLECYSTECTOMY     COLONOSCOPY WITH PROPOFOL N/A 09/30/2021   Procedure: COLONOSCOPY WITH PROPOFOL;  Surgeon: Otis Brace, MD;  Location: [place] [facility];  Service: Gastroenterology;  Laterality: N/A;   CORONARY ARTERY BYPASS GRAFT     LIMA TO THE LAD AND RIMA TO RIGHT CORONARY ARTERY   CORONARY STENT INTERVENTION N/A 02/14/2021   Procedure: CORONARY STENT INTERVENTION;  Surgeon: Sherren Mocha, MD;  Location: [place] [facility];  Service: Cardiovascular;  Laterality: N/A;   ESOPHAGOGASTRODUODENOSCOPY (EGD) WITH PROPOFOL N/A 09/30/2021   Procedure: ESOPHAGOGASTRODUODENOSCOPY (EGD) WITH PROPOFOL;  Surgeon: Otis Brace, MD;  Location: [place];  Service: Gastroenterology;  Laterality: N/A;   ORCHIECTOMY Right 04/19/2021   Procedure: RIGHT RADICAL INGUINAL ORCHIECTOMY;  Surgeon: Lucas Mallow, MD;  Location: [facility];  Service: Urology;  Laterality: Right;  1 HR FOR CASE   RIGHT/LEFT HEART CATH AND CORONARY/GRAFT ANGIOGRAPHY N/A 02/14/2021   Procedure: RIGHT/LEFT HEART CATH AND CORONARY/GRAFT ANGIOGRAPHY;  Surgeon: Sherren Mocha, MD;  Location: [place] [facility];  Service: Cardiovascular;  Laterality: N/A;   TEE WITHOUT CARDIOVERSION N/A 09/02/2020   Procedure: TRANSESOPHAGEAL ECHOCARDIOGRAM (TEE);  Surgeon: Werner Lean, MD;  Location: [facility];  Service: Cardiovascular;  Laterality: N/A;   US ECHOCARDIOGRAPHY  12/12/2007   EF 55-60%   VASECTOMY  1988     Social History:   reports that he has never smoked. He has never used smokeless tobacco. He reports that he does not drink alcohol and does not use drugs.   Family History:  His family history includes Heart attack in his father; Stroke in his father.   Allergies No Known Allergies   Home Medications  Prior to Admission medications   Medication Sig Start Date End Date Taking? Authorizing Provider  acetaminophen (TYLENOL) 500 MG tablet Take 1,000 mg by mouth every 6 (six) hours as needed for moderate pain.   Yes [provider]  brimonidine (ALPHAGAN) 0.2 % ophthalmic solution Place 1 drop into the left eye 2 (two) times daily. 12/19/20  Yes [provider]  carvedilol (COREG) 3.125 MG tablet Take 1 tablet (3.125 mg total) by mouth 2 (two) times daily with a meal. 10/06/21  Yes Chandrasekhar, Mahesh A, MD  cyanocobalamin 1000 MCG tablet Take 1 tablet (1,000 mcg total) by mouth daily. 10/02/21  Yes Eulogio Bear U, DO  dapagliflozin propanediol (  FARXIGA) 10 MG TABS tablet Take 1 tablet (10 mg total) by mouth daily. 10/06/21  Yes Chandrasekhar, Mahesh A, MD  dorzolamide-timolol (COSOPT) 22.3-6.8 MG/ML ophthalmic solution Place 1 drop into both eyes 2 (two) times daily.   Yes [provider]  ezetimibe (ZETIA) 10 MG tablet TAKE 1 TABLET(10 MG) BY MOUTH DAILY Patient taking differently: Take 10 mg by mouth daily. 04/16/19  Yes Nahser, Wonda Cheng, MD  feeding supplement (ENSURE ENLIVE / ENSURE PLUS) LIQD Take 237 mLs by mouth 2 (two) times daily between meals. 07/12/21  Yes Gonfa, Charlesetta Ivory, MD  latanoprost (XALATAN) 0.005 % ophthalmic solution Place 1 drop into both eyes at bedtime.  05/27/13  Yes [provider]  nitroGLYCERIN (NITROSTAT) 0.4 MG SL tablet Place 1 tablet (0.4 mg total) under the tongue every 5 (five) minutes as needed for chest pain. 02/15/21 02/15/22 Yes Goodrich, Callie E, PA-C  OXYGEN Inhale 3 L into the lungs continuous.   Yes [provider]  pantoprazole (PROTONIX) 40 MG tablet 40 mg BID x 30 days then once daily after Patient taking differently: Take 40 mg by mouth daily. 10/01/21  Yes Vann, Jessica U, DO  SODIUM FLUORIDE 5000 PPM 1.1 % PSTE Apply 1 application. topically in the morning and at bedtime. 05/17/21  Yes [provider]  spironolactone (ALDACTONE) 25 MG tablet TAKE 1/2 TABLET(12.5 MG) BY MOUTH DAILY Patient taking differently: Take 12.5 mg by mouth daily. 12/29/21  Yes Chandrasekhar, Mahesh A, MD  triamcinolone cream (KENALOG) 0.1 % Apply 1 application topically 2 (two) times daily as needed (eczema).   Yes [provider]  aspirin EC 81 MG tablet Take 1 tablet (81 mg total) by mouth daily. Swallow whole. Patient not taking: Reported on 02/08/2022 08/23/21   Sherren Mocha, MD  furosemide (LASIX) 40 MG tablet Take 1 tablet by mouth on Mondays, Wednesdays, and Fridays only Patient not taking: Reported on 02/01/2022 08/25/21   Nahser,  Wonda Cheng, MD  magic mouthwash (nystatin, lidocaine, diphenhydrAMINE) suspension Take 5 mLs by mouth 3 (three) times daily as needed for mouth pain. Patient not taking: Reported on 02/08/2022 09/01/21   Freddi Starr, MD

## 2022-02-10 DIAGNOSIS — Z515 Encounter for palliative care: Secondary | ICD-10-CM

## 2022-02-10 DIAGNOSIS — Z7189 Other specified counseling: Secondary | ICD-10-CM | POA: Diagnosis not present

## 2022-02-10 DIAGNOSIS — R579 Shock, unspecified: Secondary | ICD-10-CM | POA: Diagnosis not present

## 2022-02-10 LAB — BASIC METABOLIC PANEL
Anion gap: 4 — ABNORMAL LOW (ref 5–15)
BUN: 14 mg/dL (ref 8–23)
CO2: 32 mmol/L (ref 22–32)
Calcium: 8.2 mg/dL — ABNORMAL LOW (ref 8.9–10.3)
Chloride: 94 mmol/L — ABNORMAL LOW (ref 98–111)
Creatinine, Ser: 0.37 mg/dL — ABNORMAL LOW (ref 0.61–1.24)
GFR, Estimated: >60 mL/min (ref >60)
Glucose, Bld: 159 mg/dL — ABNORMAL HIGH (ref 70–99)
Potassium: 4 mmol/L (ref 3.5–5.1)
Sodium: 130 mmol/L — ABNORMAL LOW (ref 135–145)

## 2022-02-10 LAB — GLUCOSE, CAPILLARY
Glucose-Capillary: 110 mg/dL — ABNORMAL HIGH (ref 70–99)
Glucose-Capillary: 120 mg/dL — ABNORMAL HIGH (ref 70–99)
Glucose-Capillary: 125 mg/dL — ABNORMAL HIGH (ref 70–99)
Glucose-Capillary: 141 mg/dL — ABNORMAL HIGH (ref 70–99)
Glucose-Capillary: 156 mg/dL — ABNORMAL HIGH (ref 70–99)
Glucose-Capillary: 86 mg/dL (ref 70–99)

## 2022-02-10 LAB — URINE CULTURE: Culture: NO GROWTH

## 2022-02-10 LAB — PHOSPHORUS
Phosphorus: 3.1 mg/dL (ref 2.5–4.6)
Phosphorus: 2.8 mg/dL (ref 2.5–4.6)

## 2022-02-10 LAB — MAGNESIUM
Magnesium: 1.9 mg/dL (ref 1.7–2.4)
Magnesium: 1.6 mg/dL — ABNORMAL LOW (ref 1.7–2.4)

## 2022-02-10 MED ORDER — ORAL CARE MOUTH RINSE: 15.0000 mL | OROMUCOSAL | Status: DC | PRN

## 2022-02-10 NOTE — Progress Notes (Signed)
NAME:  Roberto Ponce, MRN:  [id number], DOB:  1950-10-15, LOS: 2 ADMISSION DATE:  02/08/2022, CONSULTATION DATE: 02/08/2022 REFERRING MD: Emergency department physician, CHIEF COMPLAINT: Acute respiratory failure, altered mental status metabolic disarray.  History of Present Illness:  71 year old male with extensive past medical history is well-documented below who was recently seen in the emergency room for a fall with a negative CT of the head.  He was sent home and returns with altered mental status respiratory distress negative CT of the head and required intubation for airway protection and oxygenation.  His past medical history significant for Boop, interstitial pulmonary fibrosis with progression and he is followed by Dr. Madie Reno.  He is currently intubated and hypotensive of note he is on Aldactone and Lasix as an outpatient along with antihypertensives.  He has been fluid resuscitated to 2.5 L and is currently on Levophed drip.  He is also remarkable for being hyponatremic.  We will continue fluid resuscitation admit to the intensive care unit  Pertinent  Medical History   Past Medical History:  Diagnosis Date   Asthma    BOOP (bronchiolitis obliterans with organizing pneumonia) ([place])    Chronic respiratory failure ([place])    Coronary artery disease    DVT (deep venous thrombosis) (H[facility])    Glaucoma    Heart failure with mid-range ejection fraction ([facility])    History of gallstones    Hyperlipidemia    Hyponatremia    Hypotension due to medication 06/22/2021   LVH (left ventricular hypertrophy)    MI (myocardial infarction) ([place])    Mitral regurgitation    Mixed dyslipidemia    Pneumomediastinum ([place])    Protein calorie malnutrition ([place])    Shock ([place]) 06/22/2021   Testicular cancer ([place])    Venous stasis    Significant Hospital Events: Including procedures, antibiotic start and stop dates in addition to other pertinent events   02/08/2022 CT of the head was  negative 02/08/2022 intubated  Interim History / Subjective:  Patient weaning well Discussed with spouse and daughter at bedside  Objective   Blood pressure (!) 106/59, pulse 79, temperature 97.8 F (36.6 C), temperature source Oral, resp. rate 13, height '5\' 9"'$  (1.753 m), weight 64.9 kg, SpO2 98 %.    Vent Mode: PSV;CPAP FiO2 (%):  [40 %] 40 % Set Rate:  [16 bmp] 16 bmp Vt Set:  [420 mL] 420 mL PEEP:  [5 cmH20] 5 cmH20 Pressure Support:  [12 cmH20] 12 cmH20 Plateau Pressure:  [20 cmH20-23 cmH20] 23 cmH20   Intake/Output Summary (Last 24 hours) at 02/10/2022 1151 Last data filed at 02/10/2022 1000 Gross per 24 hour  Intake 3521.77 ml  Output 2190 ml  Net 1331.77 ml   Filed Weights   02/08/22 0954 02/09/22 0421 02/10/22 0321  Weight: 59.9 kg 63.6 kg 64.9 kg    Examination: General: Elderly, does not appear to be in distress is awake alert interactive HENT: Moist oral mucosa, endotracheal tube in place Lungs: Rhonchi bilaterally Cardiovascular: S1-S2 appreciated Extremities: Bilateral lower extremity edema Neuro: Following commands  Resolved Hospital Problem list     Assessment & Plan:   Respiratory failure History of Boop Possible pneumonia -Day 3 of antibiotics -On steroids -Weaning well at present  Discussed with family -Will give patient a chance at extubation  Hyponatremia -Being corrected -Discontinue saline -Does have a history of SIADH  Coronary artery disease S/p CABG and stent Heart failure with reduced ejection fraction -Home meds on hold  Appreciate palliative  care involvement with care DNR status  Discussed with family at bedside this morning Patient also awake and interactive and able to contribute to conversation  Best Practice (right click and "Reselect all SmartList Selections" daily)   Diet/type: NPO DVT prophylaxis: prophylactic heparin  GI prophylaxis: PPI Lines: Central line Foley:  Yes, and it is still needed Code Status:   DNR Last date of multidisciplinary goals of care discussion [12/21: see IPAL note, palliative consult]  Labs   CBC: Recent Labs  Lab 02/08/22 0844 02/08/22 0914 02/08/22 1305 02/08/22 1437 02/09/22 0226 02/09/22 0251  WBC 6.5  --   --  8.0  --  9.3  NEUTROABS 4.2  --   --   --   --   --   HGB 13.4 12.2* 13.3 10.6* 11.6* 10.7*  HCT 41.3 36.0* 39.0 31.8* 34.0* 32.2*  MCV 81.9  --   --  80.1  --  78.9*  PLT 201  --   --  158  --  400    Basic Metabolic Panel: Recent Labs  Lab 02/08/22 0844 02/08/22 0914 02/08/22 1305 02/08/22 1437 02/09/22 0226 02/09/22 0251 02/09/22 1639 02/10/22 0250  NA 119*   < > 119*  --  122* 122* 130* 130*  K 5.9*   < > 4.5  --  4.3 4.1 4.1 4.0  CL 75*  --   --   --   --  84* 91* 94*  CO2 36*  --   --   --   --  30 32 32  GLUCOSE 152*  --   --   --   --  258* 127* 159*  BUN 14  --   --   --   --  6* 9 14  CREATININE 0.40*  --   --  0.45*  --  0.47* 0.42* 0.37*  CALCIUM 9.2  --   --   --   --  8.0* 8.0* 8.2*  MG  --   --   --   --   --  1.5* 2.1 1.9  PHOS  --   --   --   --   --  3.1 4.3 3.1   < > = values in this interval not displayed.   GFR: Estimated Creatinine Clearance: 78.9 mL/min (A) (by C-G formula based on SCr of 0.37 mg/dL (L)). Recent Labs  Lab 02/08/22 0844 02/08/22 1437 02/09/22 0251  PROCALCITON  --  <0.10  --   WBC 6.5 8.0 9.3  LATICACIDVEN 0.7 1.3  --     Liver Function Tests: Recent Labs  Lab 02/08/22 0844  AST 25  ALT 18  ALKPHOS 71  BILITOT 0.5  PROT 7.1  ALBUMIN 3.4*   No results for input(s): "LIPASE", "AMYLASE" in the last 168 hours. Recent Labs  Lab 02/08/22 1437  AMMONIA 36*    ABG    Component Value Date/Time   PHART 7.479 (H) 02/09/2022 0226   PCO2ART 42.9 02/09/2022 0226   PO2ART 177 (H) 02/09/2022 0226   HCO3 31.7 (H) 02/09/2022 0226   TCO2 33 (H) 02/09/2022 0226   ACIDBASEDEF 0.1 06/20/2021 0200   O2SAT 100 02/09/2022 0226     Coagulation Profile: No results for input(s): "INR",  "PROTIME" in the last 168 hours.  Cardiac Enzymes: No results for input(s): "CKTOTAL", "CKMB", "CKMBINDEX", "TROPONINI" in the last 168 hours.  HbA1C: Hgb A1c MFr Bld  Date/Time Value Ref Range Status  06/20/2021 08:03 PM 5.4 4.8 - 5.6 %  Final    Comment:    (NOTE) Pre diabetes:          5.7%-6.4%  Diabetes:              >6.4%  Glycemic control for   <7.0% adults with diabetes     CBG: Recent Labs  Lab 02/09/22 1946 02/09/22 2331 02/10/22 0328 02/10/22 0725 02/10/22 1134  GLUCAP 118* 130* 120* 141* 125*   Past Medical History:  He,  has a past medical history of Asthma, BOOP (bronchiolitis obliterans with organizing pneumonia) ([place]), Chronic respiratory failure ([place]), Coronary artery disease, DVT (deep venous thrombosis) ([place]), Glaucoma, Heart failure with mid-range ejection fraction ([place]), History of gallstones, Hyperlipidemia, Hyponatremia, Hypotension due to medication (06/22/2021), LVH (left ventricular hypertrophy), MI (myocardial infarction) ([place]), Mitral regurgitation, Mixed dyslipidemia, Pneumomediastinum ([place]), Protein calorie malnutrition ([place]), Shock ([place]) (06/22/2021), Testicular cancer ([place]), and Venous stasis.   Surgical History:   Past Surgical History:  Procedure Laterality Date   CARDIAC CATHETERIZATION  09/09/1986   EF 56%   CARDIOVASCULAR STRESS TEST  12/16/2007   EF 54% NO ISCHEMIA   CHOLECYSTECTOMY     COLONOSCOPY WITH PROPOFOL N/A 09/30/2021   Procedure: COLONOSCOPY WITH PROPOFOL;  Surgeon: Otis Brace, MD;  Location: [facility];  Service: Gastroenterology;  Laterality: N/A;   CORONARY ARTERY BYPASS GRAFT     LIMA TO THE LAD AND RIMA TO RIGHT CORONARY ARTERY   CORONARY STENT INTERVENTION N/A 02/14/2021   Procedure: CORONARY STENT INTERVENTION;  Surgeon: Sherren Mocha, MD;  Location: [facility];  Service: Cardiovascular;  Laterality: N/A;   ESOPHAGOGASTRODUODENOSCOPY (EGD) WITH PROPOFOL N/A 09/30/2021   Procedure:  ESOPHAGOGASTRODUODENOSCOPY (EGD) WITH PROPOFOL;  Surgeon: Otis Brace, MD;  Location: [facility];  Service: Gastroenterology;  Laterality: N/A;   ORCHIECTOMY Right 04/19/2021   Procedure: RIGHT RADICAL INGUINAL ORCHIECTOMY;  Surgeon: Lucas Mallow, MD;  Location: [facility];  Service: Urology;  Laterality: Right;  1 HR FOR CASE   RIGHT/LEFT HEART CATH AND CORONARY/GRAFT ANGIOGRAPHY N/A 02/14/2021   Procedure: RIGHT/LEFT HEART CATH AND CORONARY/GRAFT ANGIOGRAPHY;  Surgeon: Sherren Mocha, MD;  Location: [facility];  Service: Cardiovascular;  Laterality: N/A;   TEE WITHOUT CARDIOVERSION N/A 09/02/2020   Procedure: TRANSESOPHAGEAL ECHOCARDIOGRAM (TEE);  Surgeon: Werner Lean, MD;  Location: [facility];  Service: Cardiovascular;  Laterality: N/A;   US ECHOCARDIOGRAPHY  12/12/2007   EF 55-60%   VASECTOMY  1988     Social History:   reports that he has never smoked. He has never used smokeless tobacco. He reports that he does not drink alcohol and does not use drugs.   Family History:  His family history includes Heart attack in his father; Stroke in his father.   Allergies No Known Allergies   The patient is critically ill with multiple organ systems failure and requires high complexity decision making for assessment and support, frequent evaluation and titration of therapies, application of advanced monitoring technologies and extensive interpretation of multiple databases. Critical Care Time devoted to patient care services described in this note independent of APP/resident time (if applicable)  is 31 minutes.   Sherrilyn Rist MD [facility] Personal pager: See Amion If unanswered, please page CCM On-call: [phone]

## 2022-02-10 NOTE — Procedures (Signed)
Extubation Procedure Note  Patient Details:   Name: HARUTYUN MONTEVERDE DOB: 07-25-1950 MRN: [id number]   Airway Documentation:    Vent end date: 02/10/22 Vent end time: 1312   Evaluation  O2 sats: stable throughout Complications: No apparent complications Patient did tolerate procedure well. Bilateral Breath Sounds: Clear, Diminished   Yes, pt able to cough to clear secretions and vocalize name. Pt on 4L humidified nasal cannula and tolerating well at this time  Virgilio Frees 02/10/2022, 1:17 PM

## 2022-02-10 NOTE — Consult Note (Signed)
Palliative Medicine Inpatient Consult Note  Consulting Provider: Laurin Coder, MD   Reason for consult:   Roberto Ponce Palliative Medicine Consult  Reason for Consult? Terminal prognosis   02/10/2022  HPI:  Per intake H&P --> 71 year old male with extensive past medical history inclusive of pulmonary fibrosis, chronic hypoxic respiratory failure, CAD, and BOOP.   Admitted with AMS CT head (-).   (+) acute resp failure on ventilator, receiving tx for PNA.  Palliative care consulted to further address goals of care in the setting of underlying disease with associated poor prognosis.   Clinical Assessment/Goals of Care:  *Please note that this is a verbal dictation therefore any spelling or grammatical errors are due to the "[place] [employer]" system interpretation.  I have reviewed medical records including EPIC notes, labs and imaging, received report from bedside RN, assessed the patient who is intubated and sedated.    I met with patient and his spouse, Roberto Ponce to further discuss diagnosis prognosis, [place], EOL wishes, disposition and options.   I introduced Palliative Medicine as specialized medical care for people living with serious illness. It focuses on providing relief from the symptoms and stress of a serious illness. The goal is to improve quality of life for both the patient and the family.  Medical History Review and Understanding:  Roberto Ponce and I reviewed Roberto Ponce's history of coronary artery disease, heart failure, myocardial infarction, impaired mitral valve, testicular cancer, glucoma, and prior pneumonias.  Reviewed that patient was diagnosed with pulmonary fibrosis over the past few months.  Social History:  Roberto Ponce is from [place] originally.  He moved up to [facility] as a little boy.  He has been married to his wife for the past 42 years.  They share a son and a daughter as well as 6 grandchildren.  Formally Roberto Ponce worked in [employer] and after 40 years of work his job close down.  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  He is a man of faith and has been saved and practices within Christianity.  Functional and Nutritional State:  Per patient's spouse over the past year he has had a variety of complex issues due to his health.  These have ultimately led to a physical and functional decline whereby Roberto Ponce is predominantly in the chair or wheelchair throughout the day though per his wife he is able to walk up and down the halls with her assistance.  She does help him with basic activities of daily living.  Advance Directives:  A detailed discussion was had today regarding advanced directives.  Patient's spouse Roberto Ponce is a Air traffic controller.  Code Status:  Concepts specific to code status, artifical feeding and hydration, continued IV antibiotics and rehospitalization was had.  The difference between a aggressive medical intervention path  and a palliative comfort care path for this patient at this time was had.   We reviewed that if Roberto Ponce were to go into a cardiac arrest that his wife would not want him to [facility] cardiopulmonary resuscitation.  Discussed that although he is intubated now the plan is to not reintubate him.  I was able to educate Roberto Ponce in terms of this being extremely reasonable given his underlying disease processes most notably his pulmonary fibrosis.  Discussion:  Roberto Ponce shares that prior to about a year ago, Roberto Ponce was fully functional helping often with the grandchildren.  Unfortunately after  having suffered COVID in 2022 it was identified that he had an irregular heartbeat and he had a mitral valve leak.  In addition to that when he received a heart catheterization he was noted to have a significant blockage for which a stent was placed.  Roberto Ponce goes on to tell me that later in May he was identified to have a testicular knot  which required surgery.    Since April he has lost roughly 30-35 pounds.  He had previously been on a ventilator though he was able to get off of it.  He had previously been quite anemic in August though he received blood transfusions and iron transfusions with improvement.  We discussed the profound impact all of these health ailments and hospitalizations have had on Roberto Ponce.  A description of the chronic disease trajectory was held with the patient and his wife and how unfortunately each rehospitalization impacts patient's mind-body and spirit.  Reviewed pulmonary fibrosis and the process for which it takes in terms of progressiveness.  Discussed is something that will unfortunately continue to progress and is not curable.  Reviewed that in the setting of this patient and his wife have elected once he is extubated to not be reintubated.  Ideally Roberto Ponce would like for Roberto Ponce to become stable enough to go home.    We discussed in detail the differences between palliative care as well as hospice care:  Palliative care is specialized medical care for people living with a serious illness, such as cancer, heart failure, COPD, Alzheimer's dementia, etc. Patients in palliative care may receive medical care for their symptoms, or palliative care, along with treatment intended to cure their serious illness   Hospice care focuses on the care, comfort, and quality of life of a person with a serious illness who is approaching the end of life. At some point, it may not be possible to cure a serious illness, or a patient may choose not to undergo certain treatments. Hospice is designed for this situation.  For the time being Roberto Ponce would like to see how well Roberto Ponce is able to do once extubated.  She does confirm no reintubation and if he were to clinically worsen we talked about keeping him comfortable.  Ideally he would wish for this to be in his home though I did share in certain circumstances this is not possible  most especially if he should decline rapidly postextubation.  Plan for watchful waiting at this time.  Discussed the importance of continued conversation with family and their  medical providers regarding overall plan of care and treatment options, ensuring decisions are within the context of the patients values and GOCs.  Decision Maker: Roberto Ponce,Roberto Ponce (Spouse): [phone] ([place])   SUMMARY OF RECOMMENDATIONS   DNAR  Once extubated the plan will be to not reintubate and see how well patient is able to do  Open and honest conversations were held in the setting of patient's pulmonary fibrosis  Discussed the differences between palliative support and hospice support  Reviewed what comfort mediated care looks like in the hospital should Amadi decline rapidly  Plan at this time is for watchful waiting to see if improvements can be made  Ongoing palliative support  Code Status/Advance Care Planning: DNAR  Palliative Prophylaxis:  Aspiration, Bowel Regimen, Delirium Protocol, Frequent Pain Assessment, Oral Care, Palliative Wound Care, and Turn Reposition  Additional Recommendations (Limitations, Scope, Preferences): Continue current care  Psycho-social/Spiritual:  Desire for further Chaplaincy support: Yes Additional Recommendations: Education on pulmonary fibrosis  Prognosis: Quite limited overall given patient's chronic decline and recurrent rehospitalization's.  Hospice would be a reasonable option.  Discharge Planning: Discharge plan is uncertain at this time.  Vitals:   02/10/22 0545 02/10/22 0600  BP: (!) 94/55 (!) 94/57  Pulse: 70 69  Resp: 17 18  Temp:    SpO2: 98% 98%    Intake/Output Summary (Last 24 hours) at 02/10/2022 8335 Last data filed at 02/10/2022 0600 Gross per 24 hour  Intake 3822.95 ml  Output 3475 ml  Net 347.95 ml   Last Weight  Most recent update: 02/10/2022  3:21 AM    Weight  64.9 kg (143 lb 1.3 oz)            Gen:  Chronically ill-appearing elderly Caucasian male HEENT: Endotracheal tube in place, dry mucous membranes CV: Regular rate and rhythm PULM: On mechanical ventilator ABD: soft/nontender EXT: No edema Neuro: Alert and oriented, cannot of follow directions despite being on sedation  PPS: 10%   This conversation/these recommendations were discussed with patient primary care team, Dr. Erin Fulling  Total Time: 50  Billing based on MDM: High  Problems Addressed: One acute or chronic illness or injury that poses a threat to life or bodily function  Amount and/or Complexity of Data: Category 3:Discussion of management or test interpretation with external physician/other qualified health care professional/appropriate source (not separately reported)  Risks: Decision regarding hospitalization or escalation of hospital care and Decision not to resuscitate or to de-escalate care because of poor prognosis ______________________________________________________ [place] Team Team Cell Phone: [phone] Please utilize secure chat with additional questions, if there is no response within 30 minutes please call the above phone number  Palliative Medicine Team providers are available by phone from 7am to 7pm daily and can be reached through the team cell phone.  Should this patient require assistance outside of these hours, please call the patient's attending physician.

## 2022-02-11 ENCOUNTER — Encounter (HOSPITAL_COMMUNITY): Payer: Self-pay | Admitting: Pulmonary Disease

## 2022-02-11 ENCOUNTER — Other Ambulatory Visit: Payer: Self-pay

## 2022-02-11 DIAGNOSIS — J9621 Acute and chronic respiratory failure with hypoxia: Secondary | ICD-10-CM | POA: Diagnosis not present

## 2022-02-11 DIAGNOSIS — Z66 Do not resuscitate: Secondary | ICD-10-CM

## 2022-02-11 DIAGNOSIS — T68XXXA Hypothermia, initial encounter: Secondary | ICD-10-CM

## 2022-02-11 DIAGNOSIS — E871 Hypo-osmolality and hyponatremia: Secondary | ICD-10-CM

## 2022-02-11 DIAGNOSIS — J84112 Idiopathic pulmonary fibrosis: Secondary | ICD-10-CM

## 2022-02-11 DIAGNOSIS — R579 Shock, unspecified: Secondary | ICD-10-CM | POA: Diagnosis not present

## 2022-02-11 DIAGNOSIS — Z515 Encounter for palliative care: Secondary | ICD-10-CM | POA: Diagnosis not present

## 2022-02-11 DIAGNOSIS — Z7189 Other specified counseling: Secondary | ICD-10-CM | POA: Diagnosis not present

## 2022-02-11 DIAGNOSIS — J189 Pneumonia, unspecified organism: Secondary | ICD-10-CM

## 2022-02-11 LAB — CULTURE, RESPIRATORY W GRAM STAIN: Culture: NORMAL

## 2022-02-11 LAB — BASIC METABOLIC PANEL
Anion gap: 7 (ref 5–15)
BUN: 8 mg/dL (ref 8–23)
CO2: 36 mmol/L — ABNORMAL HIGH (ref 22–32)
Calcium: 9 mg/dL (ref 8.9–10.3)
Chloride: 89 mmol/L — ABNORMAL LOW (ref 98–111)
Creatinine, Ser: 0.39 mg/dL — ABNORMAL LOW (ref 0.61–1.24)
GFR, Estimated: >60 mL/min (ref >60)
Glucose, Bld: 105 mg/dL — ABNORMAL HIGH (ref 70–99)
Potassium: 4.4 mmol/L (ref 3.5–5.1)
Sodium: 132 mmol/L — ABNORMAL LOW (ref 135–145)

## 2022-02-11 LAB — CBC
HCT: 33.7 % — ABNORMAL LOW (ref 39.0–52.0)
Hemoglobin: 10.5 g/dL — ABNORMAL LOW (ref 13.0–17.0)
MCH: 26.4 pg (ref 26.0–34.0)
MCHC: 31.2 g/dL (ref 30.0–36.0)
MCV: 84.9 fL (ref 80.0–100.0)
Platelets: 146 10*3/uL — ABNORMAL LOW (ref 150–400)
RBC: 3.97 MIL/uL — ABNORMAL LOW (ref 4.22–5.81)
RDW: 17.5 % — ABNORMAL HIGH (ref 11.5–15.5)
WBC: 8.8 10*3/uL (ref 4.0–10.5)
nRBC: 0 % (ref 0.0–0.2)

## 2022-02-11 LAB — GLUCOSE, CAPILLARY
Glucose-Capillary: 91 mg/dL (ref 70–99)
Glucose-Capillary: 96 mg/dL (ref 70–99)
Glucose-Capillary: 123 mg/dL — ABNORMAL HIGH (ref 70–99)
Glucose-Capillary: 105 mg/dL — ABNORMAL HIGH (ref 70–99)
Glucose-Capillary: 121 mg/dL — ABNORMAL HIGH (ref 70–99)

## 2022-02-11 LAB — PHOSPHORUS: Phosphorus: 4.1 mg/dL (ref 2.5–4.6)

## 2022-02-11 LAB — MAGNESIUM
Magnesium: 1.8 mg/dL (ref 1.7–2.4)
Magnesium: 1.9 mg/dL (ref 1.7–2.4)

## 2022-02-11 MED ORDER — SPIRONOLACTONE 12.5 MG HALF TABLET
12.5000 mg | ORAL_TABLET | Freq: Every day | ORAL | Status: DC
  Administered 2022-02-11 – 2022-02-12 (×2): 12.5 mg via ORAL
  Filled 2022-02-11 (×2): qty 1

## 2022-02-11 MED ORDER — ASPIRIN 81 MG PO TBEC
81.0000 mg | DELAYED_RELEASE_TABLET | Freq: Every day | ORAL | Status: DC
  Administered 2022-02-11 – 2022-02-14 (×4): 81 mg via ORAL
  Filled 2022-02-11 (×4): qty 1

## 2022-02-11 MED ORDER — EZETIMIBE 10 MG PO TABS
10.0000 mg | ORAL_TABLET | Freq: Every day | ORAL | Status: DC
  Administered 2022-02-11 – 2022-02-14 (×4): 10 mg via ORAL
  Filled 2022-02-11 (×4): qty 1

## 2022-02-11 MED ORDER — ORAL CARE MOUTH RINSE: 15.0000 mL | OROMUCOSAL | Status: DC | PRN

## 2022-02-11 MED ORDER — CARVEDILOL 3.125 MG PO TABS
3.1250 mg | ORAL_TABLET | Freq: Two times a day (BID) | ORAL | Status: DC
  Administered 2022-02-11 – 2022-02-14 (×6): 3.125 mg via ORAL
  Filled 2022-02-11 (×6): qty 1

## 2022-02-11 MED ORDER — PANTOPRAZOLE SODIUM 40 MG PO TBEC
40.0000 mg | DELAYED_RELEASE_TABLET | Freq: Every day | ORAL | Status: DC
  Administered 2022-02-12 – 2022-02-14 (×3): 40 mg via ORAL
  Filled 2022-02-11 (×3): qty 1

## 2022-02-11 MED ORDER — DAPAGLIFLOZIN PROPANEDIOL 10 MG PO TABS
10.0000 mg | ORAL_TABLET | Freq: Every day | ORAL | Status: DC
  Administered 2022-02-11 – 2022-02-14 (×4): 10 mg via ORAL
  Filled 2022-02-11 (×4): qty 1

## 2022-02-11 MED ORDER — VITAMIN B-12 1000 MCG PO TABS
1000.0000 ug | ORAL_TABLET | Freq: Every day | ORAL | Status: DC
  Administered 2022-02-11 – 2022-02-14 (×4): 1000 ug via ORAL
  Filled 2022-02-11 (×4): qty 1

## 2022-02-11 NOTE — Evaluation (Addendum)
Physical Therapy Evaluation Patient Details Name: Roberto Ponce MRN: [id number] DOB: 08/26/1950 Today's Date: 02/11/2022  History of Present Illness  Patient is a 71 y/o male who presents on 12/21 with AMS and tachypnea. Intubated 12/21-12/23. Found to have acute respiratory failure and possible PNA. PMH includes CAD/CABG/stenting, mitral valve prolapse, MR, chronic HF, testicular cancer s/p orchiectomy 04/2021, DVT may 2023, chronic hypoxic respiratory failure on 4L, BOOP, interstitial pulmonary fibrosis with progression.  Clinical Impression  Patient presents with generalized weakness, dizziness, impaired balance, dyspnea on exertion and impaired mobility s/p above. Pt lives at home with his wife and reports being mostly mod I for ADLs and using RW the last few weeks due to weakness. Today, pt requires Mod A to stand and Min A to take a few steps to get to the chair. Sp02 remained >95% on 3L/min 02 [place] throughout session. Mild 2/4 DOE noted. BP 138/83, dizziness reported. Wife would like to get pt strong enough to return home and be able to ambulate household distances. Mobility tech consulted. Pt had recently started pulmonary rehab but will likely need HHPT prior to resuming pulmonary rehab to improve overall strength and mobility. Will follow acutely to maximize independence and mobility prior to return home.       Recommendations for follow up therapy are one component of a multi-disciplinary discharge planning process, led by the attending physician.  Recommendations may be updated based on patient status, additional functional criteria and insurance authorization.  Follow Up Recommendations Home health PT      Assistance Recommended at Discharge Frequent or constant Supervision/Assistance  Patient can return home with the following  A lot of help with walking and/or transfers;A little help with bathing/dressing/bathroom;Assist for transportation;Assistance with cooking/housework     Equipment Recommendations None recommended by PT  Recommendations for Other Services       Functional Status Assessment Patient has had a recent decline in their functional status and demonstrates the ability to make significant improvements in function in a reasonable and predictable amount of time.     Precautions / Restrictions Precautions Precautions: Fall Precaution Comments: watch 02 Restrictions Weight Bearing Restrictions: No      Mobility  Bed Mobility Overal bed mobility: Needs Assistance Bed Mobility: Supine to Sit     Supine to sit: Supervision, HOB elevated     General bed mobility comments: no assist needed, posterior bias. Increased time.    Transfers Overall transfer level: Needs assistance Equipment used: Rolling walker (2 wheels) Transfers: Sit to/from Stand, Bed to chair/wheelchair/BSC Sit to Stand: Mod assist   Step pivot transfers: Min assist       General transfer comment: Mod A to power to standing with cues for hand placement/technique. Stood from [employer]. Min A to take a few steps to get to chair with assist for RW management and balance. Dizziness since sitting up EOB, however Sp02 and BP stable. 138/83 and Sp02 >95% on 3L/min 02 [place].    Ambulation/Gait             Pre-gait activities: worked on marching in place x10 seconds with bil knees flexed    Stairs            Wheelchair Mobility    Modified Rankin (Stroke Patients Only)       Balance Overall balance assessment: Needs assistance Sitting-balance support: Feet supported, No upper extremity supported Sitting balance-Leahy Scale: Fair Sitting balance - Comments: posterior bias and lean but able to regain balance to upright  with and without cues Postural control: Posterior lean Standing balance support: During functional activity, Bilateral upper extremity supported Standing balance-Leahy Scale: Poor Standing balance comment: Requires UE support in standing                              Pertinent Vitals/Pain Pain Assessment Pain Assessment: No/denies pain    Home Living Family/patient expects to be discharged to:: Private residence Living Arrangements: Spouse/significant other Available Help at Discharge: Family;Available 24 hours/day Type of Home: House Home Access: Level entry       Home Layout: One level Home Equipment: Advice worker (2 wheels);Cane - single point;Wheelchair - manual Additional Comments: Uses 2L O2 at home    Prior Function Prior Level of Function : Independent/Modified Independent             Mobility Comments: Pt uses RW as needed, more so than not the last few weeks ADLs Comments: Pt independent with self-care but does require assistance to get in and out of tub. Pt's spouse takes care of errands but otherwise always with pt.     Hand Dominance   Dominant Hand: Right    Extremity/Trunk Assessment   Upper Extremity Assessment Upper Extremity Assessment: Defer to OT evaluation    Lower Extremity Assessment Lower Extremity Assessment: Generalized weakness    Cervical / Trunk Assessment Cervical / Trunk Assessment: Normal  Communication   Communication: No difficulties  Cognition Arousal/Alertness: Awake/alert Behavior During Therapy: WFL for tasks assessed/performed, Flat affect Overall Cognitive Status: Impaired/Different from baseline Area of Impairment: Problem solving                             Problem Solving: Slow processing, Requires verbal cues General Comments: A&Ox3. Slow processing. Flat affect. Wife says cognition seems improved and closer to baseline today.        General Comments General comments (skin integrity, edema, etc.): Wife present during session. Sp02 remained >95% on 3L/min 02 [place] throughout session. Mild 2/4 DOE noted. BP 138/83, dizziness reported.    Exercises     Assessment/Plan    PT Assessment Patient needs continued PT services   PT Problem List Decreased strength;Decreased mobility;Decreased balance;Decreased activity tolerance;Cardiopulmonary status limiting activity;Decreased cognition       PT Treatment Interventions Therapeutic exercise;Gait training;Patient/family education;Therapeutic activities;Balance training;Functional mobility training    PT Goals (Current goals can be found in the Care Plan section)  Acute Rehab PT Goals Patient Stated Goal: to get better and go home, continue pulmonary rehab PT Goal Formulation: With patient/family Time For Goal Achievement: 02/25/22 Potential to Achieve Goals: Good    Frequency Min 3X/week     Co-evaluation               AM-PAC PT "6 Clicks" Mobility  Outcome Measure Help needed turning from your back to your side while in a flat bed without using bedrails?: A Little Help needed moving from lying on your back to sitting on the side of a flat bed without using bedrails?: A Little Help needed moving to and from a bed to a chair (including a wheelchair)?: A Little Help needed standing up from a chair using your arms (e.g., wheelchair or bedside chair)?: A Lot Help needed to walk in hospital room?: A Lot Help needed climbing 3-5 steps with a railing? : A Lot 6 Click Score: 15    End of Session  Equipment Utilized During Treatment: Gait belt;Oxygen Activity Tolerance: Patient tolerated treatment well;Treatment limited secondary to medical complications (Comment) (dizziness) Patient left: in chair;with call bell/phone within reach;with family/visitor present;Other (comment) (with OT present in room) Nurse Communication: Mobility status PT Visit Diagnosis: Muscle weakness (generalized) (M62.81);Difficulty in walking, not elsewhere classified (R26.2);Unsteadiness on feet (R26.81)    Time: 8469-6295 PT Time Calculation (min) (ACUTE ONLY): 29 min   Charges:   PT Evaluation $PT Eval Moderate Complexity: 1 Mod PT Treatments $Therapeutic Activity: 8-22  mins        Marisa Severin, PT, DPT Acute Rehabilitation Services Secure chat preferred Office [phone]     Marguarite Arbour A Sabra Heck 02/11/2022, 3:31 PM

## 2022-02-11 NOTE — Progress Notes (Addendum)
[facility] PROGRESS NOTE   VANDELL KUN IHK:[id number] DOB: 1950-08-21 DOA: 02/08/2022  PCP: Kristen Loader, FNP  Brief History/Interval Summary: 71 year old Caucasian male with a past medical history of Boop, interstitial pulmonary fibrosis with progression, chronic hypoxic respiratory failure, coronary artery disease who presented to the emergency department with altered mental status after sustaining a recent fall.  No acute findings were noted on CT head.  However patient had to be intubated for airway protection.  He was admitted to the ICU.  He was stabilized.  Palliative care was consulted.  Subsequently extubated.  He was changed over to DNR status with no plans for reintubation.  Consultants: Pulmonology.  Palliative care  Procedures: Intubation    Subjective/Interval History: Patient sitting up on his bed eating breakfast.  Denies any chest pain or shortness of breath.  No nausea or vomiting.  His wife is at the bedside.    Assessment/Plan:  Acute on chronic respiratory failure with hypoxia/history of Boop/interstitial pulmonary fibrosis Patient was intubated in the ICU.  Extubated on 12/23.  Seems to be stable from a respiratory standpoint. Currently on 5 L of oxygen by nasal cannula.  Saturations are in the late 90s.  Can be weaned down to maintain saturations greater than 92%.  Uses between 2 to 3 L of oxygen at home. Patient on steroids.  Pulmonology to continue to follow.  Community-acquired pneumonia There was concern for pneumonia over and above his chronic lung issues.  He was started on ceftriaxone and azithromycin.  Plan is for a 5-day course.  Started on 12/21.  Hyponatremia Possibly due to SIADH considering his chronic lung issues.  Sodium levels have improved.  Normocytic anemia No evidence of overt bleeding.  Hypomagnesemia Corrected  Coronary artery disease status post CABG Stable.  Echocardiogram showed normal systolic function  without any regional wall motion abnormalities.  Continue aspirin beta-blocker.  Goals of care Palliative care following.  Goals of care conversation took place yesterday.  Currently DNR.  No plans for reintubation.  Severe protein calorie malnutrition Nutrition Problem: Severe Malnutrition Etiology: chronic illness (CHF, pulmonary fibrosis) Signs/Symptoms: severe fat depletion, severe muscle depletion, percent weight loss (13.9% weight loss in 7 months) Percent weight loss: 13.9 % Interventions: MVI, Tube feeding   DVT Prophylaxis: Subcutaneous heparin Code Status: DNR Family Communication: Discussed with the patient and his wife Disposition Plan: Start mobilizing.  Hopefully discharge home when cleared by pulmonology.  PT OT eval.  Status is: Inpatient Remains inpatient appropriate because: Acute on chronic hypoxic respiratory failure      Medications: Scheduled:  aspirin EC  81 mg Oral Daily   brimonidine  1 drop Left Eye BID   carvedilol  3.125 mg Oral BID WC   Chlorhexidine Gluconate Cloth  6 each Topical Q0600   cyanocobalamin  1,000 mcg Oral Daily   dapagliflozin propanediol  10 mg Oral Daily   dorzolamide-timolol  1 drop Both Eyes BID   ezetimibe  10 mg Oral Daily   heparin  5,000 Units Subcutaneous Q8H   latanoprost  1 drop Both Eyes QHS   methylPREDNISolone (SOLU-MEDROL) injection  40 mg Intravenous Q12H   multivitamin with minerals  1 tablet Per Tube Daily   pantoprazole  40 mg Oral Daily   sodium chloride flush  10-40 mL Intracatheter Q12H   spironolactone  12.5 mg Oral Daily   Continuous:  sodium chloride     azithromycin 500 mg (02/11/22 1002)   cefTRIAXone (ROCEPHIN)  IV 2 g (02/11/22  0843)   JIR:CVELFYBO sodium, fentaNYL (SUBLIMAZE) injection, fentaNYL (SUBLIMAZE) injection, midazolam, mouth rinse, mouth rinse, mouth rinse, polyethylene glycol, sodium chloride flush  Antibiotics: Anti-infectives (From admission, onward)    Start     Dose/Rate Route  Frequency Ordered Stop   02/08/22 0900  cefTRIAXone (ROCEPHIN) 2 g in sodium chloride 0.9 % 100 mL IVPB        2 g 200 mL/hr over 30 Minutes Intravenous Every 24 hours 02/08/22 0855 02/13/22 0859   02/08/22 0900  azithromycin (ZITHROMAX) 500 mg in sodium chloride 0.9 % 250 mL IVPB        500 mg 250 mL/hr over 60 Minutes Intravenous Every 24 hours 02/08/22 0855 02/13/22 0859       Objective:  Vital Signs  Vitals:   02/11/22 0204 02/11/22 0218 02/11/22 0535 02/11/22 0749  BP: 121/74  113/67 117/79  Pulse: 84  81 80  Resp: '20  19 20  '$ Temp: (!) 97.4 F (36.3 C)  (!) 97.5 F (36.4 C) 97.8 F (36.6 C)  TempSrc: Oral  Oral Oral  SpO2: 100%  100% 100%  Weight:  65.8 kg    Height:        Intake/Output Summary (Last 24 hours) at 02/11/2022 1007 Last data filed at 02/11/2022 0846 Gross per 24 hour  Intake 855.49 ml  Output 1730 ml  Net -874.51 ml   Filed Weights   02/09/22 0421 02/10/22 0321 02/11/22 0218  Weight: 63.6 kg 64.9 kg 65.8 kg    General appearance: Awake alert.  In no distress Resp: Crackles heard bilaterally left more than right.  Normal effort at rest.  No wheezing. Cardio: S1-S2 is normal regular.  No S3-S4.  No rubs murmurs or bruit GI: Abdomen is soft.  Nontender nondistended.  Bowel sounds are present normal.  No masses organomegaly Extremities: No edema.  Moving all of his extremities Neurologic:   No focal neurological deficits.    Lab Results:  Data Reviewed: I have personally reviewed following labs and reports of the imaging studies  CBC: Recent Labs  Lab 02/08/22 0844 02/08/22 0914 02/08/22 1305 02/08/22 1437 02/09/22 0226 02/09/22 0251 02/11/22 0315  WBC 6.5  --   --  8.0  --  9.3 8.8  NEUTROABS 4.2  --   --   --   --   --   --   HGB 13.4   < > 13.3 10.6* 11.6* 10.7* 10.5*  HCT 41.3   < > 39.0 31.8* 34.0* 32.2* 33.7*  MCV 81.9  --   --  80.1  --  78.9* 84.9  PLT 201  --   --  158  --  182 146*   < > = values in this interval not  displayed.    Basic Metabolic Panel: Recent Labs  Lab 02/08/22 0844 02/08/22 0914 02/08/22 1437 02/09/22 0226 02/09/22 0251 02/09/22 1639 02/10/22 0250 02/10/22 1700 02/11/22 0315  NA 119*   < >  --  122* 122* 130* 130*  --  132*  K 5.9*   < >  --  4.3 4.1 4.1 4.0  --  4.4  CL 75*  --   --   --  84* 91* 94*  --  89*  CO2 36*  --   --   --  30 32 32  --  36*  GLUCOSE 152*  --   --   --  258* 127* 159*  --  105*  BUN 14  --   --   --  6* 9 14  --  8  CREATININE 0.40*  --  0.45*  --  0.47* 0.42* 0.37*  --  0.39*  CALCIUM 9.2  --   --   --  8.0* 8.0* 8.2*  --  9.0  MG  --   --   --   --  1.5* 2.1 1.9 1.6* 1.8  PHOS  --   --   --   --  3.1 4.3 3.1 2.8 4.1   < > = values in this interval not displayed.    GFR: Estimated Creatinine Clearance: 80 mL/min (A) (by C-G formula based on SCr of 0.39 mg/dL (L)).  Liver Function Tests: Recent Labs  Lab 02/08/22 0844  AST 25  ALT 18  ALKPHOS 71  BILITOT 0.5  PROT 7.1  ALBUMIN 3.4*     Recent Labs  Lab 02/08/22 1437  AMMONIA 36*    CBG: Recent Labs  Lab 02/10/22 1612 02/10/22 1938 02/10/22 2349 02/11/22 0325 02/11/22 0752  GLUCAP 86 156* 110* 91 96    Lipid Profile: Recent Labs    02/09/22 0251  TRIG 41     Recent Results (from the past 240 hour(s))  Culture, blood (routine x 2)     Status: None (Preliminary result)   Collection Time: 02/08/22  8:40 AM   Specimen: BLOOD  Result Value Ref Range Status   Specimen Description BLOOD SITE NOT SPECIFIED  Final   Special Requests   Final    BOTTLES DRAWN AEROBIC AND ANAEROBIC Blood Culture adequate volume   Culture   Final    NO GROWTH 3 DAYS Performed at [facility], [place] [address] [place], [place] [address]    Report Status PENDING  Incomplete  Resp panel by RT-PCR (RSV, Flu A&B, Covid)     Status: None   Collection Time: 02/08/22  8:41 AM   Specimen: Nasal Swab  Result Value Ref Range Status   SARS Coronavirus 2 by RT PCR NEGATIVE NEGATIVE Final     Comment: (NOTE) SARS-CoV-2 target nucleic acids are NOT DETECTED.  The SARS-CoV-2 RNA is generally detectable in upper respiratory specimens during the acute phase of infection. The lowest concentration of SARS-CoV-2 viral copies this assay can detect is 138 copies/mL. A negative result does not preclude SARS-Cov-2 infection and should not be used as the sole basis for treatment or other patient management decisions. A negative result may occur with  improper specimen collection/handling, submission of specimen other than nasopharyngeal swab, presence of viral mutation(s) within the areas targeted by this assay, and inadequate number of viral copies(<138 copies/mL). A negative result must be combined with clinical observations, patient history, and epidemiological information. The expected result is Negative.  Fact Sheet for Patients:  EntrepreneurPulse.com.au  Fact Sheet for Healthcare Providers:  IncredibleEmployment.be  This test is no t yet approved or cleared by the [place] FDA and  has been authorized for detection and/or diagnosis of SARS-CoV-2 by FDA under an Emergency Use Authorization (EUA). This EUA will remain  in effect (meaning this test can be used) for the duration of the COVID-19 declaration under Section 564(b)(1) of the Act, 21 U.S.C.section 360bbb-3(b)(1), unless the authorization is terminated  or revoked sooner.       Influenza A by PCR NEGATIVE NEGATIVE Final   Influenza B by PCR NEGATIVE NEGATIVE Final    Comment: (NOTE) The Xpert Xpress SARS-CoV-2/FLU/RSV plus assay is intended as an aid in the diagnosis of influenza from Nasopharyngeal swab specimens  and should not be used as a sole basis for treatment. Nasal washings and aspirates are unacceptable for Xpert Xpress SARS-CoV-2/FLU/RSV testing.  Fact Sheet for Patients: EntrepreneurPulse.com.au  Fact Sheet for Healthcare  Providers: IncredibleEmployment.be  This test is not yet approved or cleared by the [place] FDA and has been authorized for detection and/or diagnosis of SARS-CoV-2 by FDA under an Emergency Use Authorization (EUA). This EUA will remain in effect (meaning this test can be used) for the duration of the COVID-19 declaration under Section 564(b)(1) of the Act, 21 U.S.C. section 360bbb-3(b)(1), unless the authorization is terminated or revoked.     Resp Syncytial Virus by PCR NEGATIVE NEGATIVE Final    Comment: (NOTE) Fact Sheet for Patients: EntrepreneurPulse.com.au  Fact Sheet for Healthcare Providers: IncredibleEmployment.be  This test is not yet approved or cleared by the [place] FDA and has been authorized for detection and/or diagnosis of SARS-CoV-2 by FDA under an Emergency Use Authorization (EUA). This EUA will remain in effect (meaning this test can be used) for the duration of the COVID-19 declaration under Section 564(b)(1) of the Act, 21 U.S.C. section 360bbb-3(b)(1), unless the authorization is terminated or revoked.  Performed at [facility], [place] [address] [place], [place] [address]   Culture, Respiratory w Gram Stain (tracheal aspirate)     Status: None   Collection Time: 02/08/22 11:11 AM   Specimen: Tracheal Aspirate; Respiratory  Result Value Ref Range Status   Specimen Description TRACHEAL ASPIRATE  Final   Special Requests NONE  Final   Gram Stain   Final    MODERATE WBC PRESENT,BOTH PMN AND MONONUCLEAR FEW GRAM POSITIVE COCCI IN PAIRS IN CLUSTERS    Culture   Final    FEW Normal respiratory flora-no Staph aureus or Pseudomonas seen Performed at [facility], [address] [place], [place] [address]    Report Status 02/11/2022 FINAL  Final  MRSA Next Gen by PCR, Nasal     Status: None   Collection Time: 02/08/22  3:15 PM   Specimen: Nasal Mucosa; Nasal Swab  Result Value  Ref Range Status   MRSA by PCR Next Gen NOT DETECTED NOT DETECTED Final    Comment: (NOTE) The GeneXpert MRSA Assay (FDA approved for NASAL specimens only), is one component of a comprehensive MRSA colonization surveillance program. It is not intended to diagnose MRSA infection nor to guide or monitor treatment for MRSA infections. Test performance is not FDA approved in patients less than 58 years old. Performed at [facility], [place] [address], [place], [place] [address]   Culture, blood (Routine X 2) w Reflex to ID Panel     Status: None (Preliminary result)   Collection Time: 02/08/22  5:05 PM   Specimen: BLOOD RIGHT HAND  Result Value Ref Range Status   Specimen Description BLOOD RIGHT HAND  Final   Special Requests   Final    BOTTLES DRAWN AEROBIC AND ANAEROBIC Blood Culture results may not be optimal due to an inadequate volume of blood received in culture bottles   Culture   Final    NO GROWTH 3 DAYS Performed at [facility], [place] [address] [place], [place] [address]    Report Status PENDING  Incomplete  Culture, blood (Routine X 2) w Reflex to ID Panel     Status: None (Preliminary result)   Collection Time: 02/08/22  5:07 PM   Specimen: BLOOD RIGHT HAND  Result Value Ref Range Status  Specimen Description BLOOD RIGHT HAND  Final   Special Requests   Final    BOTTLES DRAWN AEROBIC AND ANAEROBIC Blood Culture results may not be optimal due to an inadequate volume of blood received in culture bottles   Culture   Final    NO GROWTH 3 DAYS Performed at [facility], [place] [address], [place], [place] [address]    Report Status PENDING  Incomplete  Urine Culture     Status: None   Collection Time: 02/08/22  5:38 PM   Specimen: Urine, Clean Catch  Result Value Ref Range Status   Specimen Description URINE, CLEAN CATCH  Final   Special Requests NONE  Final   Culture   Final    NO GROWTH Performed at [facility], [place] [address] [place],  [place] [address]    Report Status 02/10/2022 FINAL  Final      Radiology Studies: ECHOCARDIOGRAM LIMITED  Result Date: 02/09/2022    ECHOCARDIOGRAM LIMITED REPORT   Patient Name:   CHERYL STABENOW Date of Exam: 02/09/2022 Medical Rec #:  [id number]           Height:       69.0 in Accession #:    [id number]          Weight:       140.2 lb Date of Birth:  01/23/1951           BSA:          1.777 m Patient Age:    21 years            BP:           107/60 mmHg Patient Gender: M                   HR:           82 bpm. Exam Location:  Inpatient Procedure: Limited Echo, Limited Color Doppler and Cardiac Doppler Indications:     Congestive Heart Failure I50.9  History:         Patient has prior history of Echocardiogram examinations, most                  recent 07/05/2021. CHF and LVH, CAD and Previous Myocardial                  Infarction, Shock, Mitral Valve Disease; Risk                  Factors:Non-Smoker and Dyslipidemia.  Sonographer:     Greer Pickerel Referring Phys:  [id number] Freddi Starr Diagnosing Phys: [facility]  Sonographer Comments: Echo performed with patient supine and on artificial respirator. Image acquisition challenging due to respiratory motion. IMPRESSIONS  1. Left ventricular ejection fraction, by estimation, is 60 to 65%. The left ventricle has normal function. The left ventricle has no regional wall motion abnormalities. Left ventricular diastolic parameters are consistent with Grade I diastolic dysfunction (impaired relaxation).  2. Right ventricular systolic function is normal. The right ventricular size is normal.  3. Trivial mitral valve regurgitation.  4. There is mild calcification of the aortic valve. Aortic valve regurgitation is not visualized. Conclusion(s)/Recommendation(s): Compared to prior study 07/05/2021, EF has improved. FINDINGS  Left Ventricle: Left ventricular ejection fraction, by estimation, is 60 to 65%. The left ventricle has normal function. The left ventricle has  no regional wall motion abnormalities. Definity contrast agent was given IV to delineate the left ventricular  endocardial borders. There is no left ventricular hypertrophy. Left ventricular diastolic parameters are consistent with Grade I diastolic dysfunction (impaired relaxation). Right Ventricle: The right ventricular size is normal. Right ventricular systolic function is normal. Mitral Valve: Trivial mitral valve regurgitation. Tricuspid Valve: Tricuspid valve regurgitation is trivial. Aortic Valve: There is mild calcification of the aortic valve. Aortic valve regurgitation is not visualized. Additional Comments: Spectral Doppler performed. Color Doppler performed.  LEFT VENTRICLE PLAX 2D LVIDd:         4.90 cm LVIDs:         3.40 cm LV PW:         0.80 cm LV IVS:        0.80 cm LVOT diam:     1.80 cm LV SV:         49 LV SV Index:   28 LVOT Area:     2.54 cm  LEFT ATRIUM         Index LA diam:    4.30 cm 2.42 cm/m  AORTIC VALVE LVOT Vmax:   105.00 cm/s LVOT Vmean:  68.100 cm/s LVOT VTI:    0.193 m  AORTA Ao Root diam: 3.00 cm MITRAL VALVE                TRICUSPID VALVE MV Area (PHT): 3.77 cm     TR Peak grad:   29.8 mmHg MV Decel Time: 201 msec     TR Vmax:        273.00 cm/s MR Peak grad: 41.5 mmHg MR Vmax:      322.00 cm/s   SHUNTS MV E velocity: 94.30 cm/s   Systemic VTI:  0.19 m MV A velocity: 116.00 cm/s  Systemic Diam: 1.80 cm MV E/A ratio:  0.81 Mary Scientist, physiological signed by Phineas Inches Signature Date/Time: 02/09/2022/11:37:04 AM    Final (Updated)        LOS: 3 days   [facility] Pager on [email or web address]  02/11/2022, 10:07 AM

## 2022-02-11 NOTE — Progress Notes (Signed)
PT transferred to 3W by bed with all belongings and wife at bedside. There were no complication.   Sherren Mocha

## 2022-02-11 NOTE — Evaluation (Signed)
Occupational Therapy Evaluation Patient Details Name: Roberto Ponce MRN: [id number] DOB: [birth year] Today's Date: 02/11/2022   History of Present Illness Patient is a 71 y/o male who presents on 12/21 with AMS and tachypnea. Intubated 12/21-12/23. Found to have acute respiratory failure and possible PNA. PMH includes CAD/CABG/stenting, mitral valve prolapse, MR, chronic HF, testicular cancer s/p orchiectomy 04/2021, DVT may 2023, chronic hypoxic respiratory failure on 4L, BOOP, interstitial pulmonary fibrosis with progression.   Clinical Impression   Pt presenting with problem above with deficits listed below. Pt reports he was mod I for ADL, but wife recently assisting with LB dressing. Pt presenting with weakness, dizziness, decreased balance, and dyspnea on exertion. Pt performing UB ADL with set-up and LB ADL with up to min A. Pt with dizziness once EOB, but not changing with standing; and VSS. Recommending HHOT to optimize safety and independence in ADL.      Recommendations for follow up therapy are one component of a multi-disciplinary discharge planning process, led by the attending physician.  Recommendations may be updated based on patient status, additional functional criteria and insurance authorization.   Follow Up Recommendations  Home health OT     Assistance Recommended at Discharge Intermittent Supervision/Assistance  Patient can return home with the following A little help with walking and/or transfers;A little help with bathing/dressing/bathroom;Assistance with cooking/housework;Direct supervision/assist for financial management;Assist for transportation;Help with stairs or ramp for entrance;Direct supervision/assist for medications management    Functional Status Assessment  Patient has had a recent decline in their functional status and demonstrates the ability to make significant improvements in function in a reasonable and predictable amount of time.  Equipment  Recommendations  None recommended by OT    Recommendations for Other Services       Precautions / Restrictions Precautions Precautions: Fall;Other (comment) Precaution Comments: watch 02 Restrictions Weight Bearing Restrictions: No      Mobility Bed Mobility Overal bed mobility: Needs Assistance Bed Mobility: Supine to Sit     Supine to sit: Supervision, HOB elevated     General bed mobility comments: no assist needed, posterior bias. Increased time.    Transfers Overall transfer level: Needs assistance Equipment used: Rolling walker (2 wheels) Transfers: Sit to/from Stand, Bed to chair/wheelchair/BSC Sit to Stand: Min assist     Step pivot transfers: Min assist     General transfer comment: Cues for hand placement and RW management      Balance Overall balance assessment: Needs assistance Sitting-balance support: Feet supported, No upper extremity supported Sitting balance-Leahy Scale: Fair Sitting balance - Comments: posterior bias and lean but able to regain balance to upright with and without cues Postural control: Posterior lean Standing balance support: During functional activity, Bilateral upper extremity supported Standing balance-Leahy Scale: Poor Standing balance comment: Requires UE support in standing                           ADL either performed or assessed with clinical judgement   ADL Overall ADL's : Needs assistance/impaired Eating/Feeding: Modified independent;Sitting   Grooming: Wash/dry face;Set up;Sitting   Upper Body Bathing: Set up;Sitting   Lower Body Bathing: Min guard;Sitting/lateral leans   Upper Body Dressing : Set up;Sitting   Lower Body Dressing: Min guard;Sitting/lateral leans Lower Body Dressing Details (indicate cue type and reason): Min gaurd A for pt to reach down from chair and don sock Toilet Transfer: Stand-pivot;Rolling walker (2 wheels);Minimal assistance  Functional mobility during  ADLs: Minimal assistance;Rolling walker (2 wheels) (SPT)       Vision Patient Visual Report: No change from baseline       Perception     Praxis      Pertinent Vitals/Pain Pain Assessment Pain Assessment: No/denies pain     Hand Dominance Right   Extremity/Trunk Assessment Upper Extremity Assessment Upper Extremity Assessment: Generalized weakness   Lower Extremity Assessment Lower Extremity Assessment: Generalized weakness   Cervical / Trunk Assessment Cervical / Trunk Assessment: Normal   Communication Communication Communication: No difficulties   Cognition Arousal/Alertness: Awake/alert Behavior During Therapy: WFL for tasks assessed/performed, Flat affect Overall Cognitive Status: Impaired/Different from baseline Area of Impairment: Problem solving                             Problem Solving: Slow processing, Requires verbal cues General Comments: A&Ox3. Slow processing. Flat affect. Wife says cognition seems improved and closer to baseline today.     General Comments  wife present and supportive    Exercises     Shoulder Instructions      Home Living Family/patient expects to be discharged to:: Private residence Living Arrangements: Spouse/significant other Available Help at Discharge: Family;Available 24 hours/day Type of Home: House Home Access: Level entry     Home Layout: One level     Bathroom Shower/Tub: Teacher, early years/pre: Handicapped height     Home Equipment: Advice worker (2 wheels);Cane - single point;Wheelchair - manual   Additional Comments: Uses 2L O2 at home      Prior Functioning/Environment Prior Level of Function : Independent/Modified Independent;Needs assist             Mobility Comments: Pt uses RW as needed, more so than not the last few weeks ADLs Comments: Pt independent with self-care but does require assistance to get in and out of tub. Pt's spouse takes care of  errands but otherwise always with pt. Wife performing cooking/cleaning, and overshight for medication management        OT Problem List: Decreased strength;Impaired balance (sitting and/or standing);Decreased activity tolerance;Decreased cognition;Decreased safety awareness;Decreased knowledge of use of DME or AE;Cardiopulmonary status limiting activity      OT Treatment/Interventions: Self-care/ADL training;Therapeutic exercise;DME and/or AE instruction;Therapeutic activities;Patient/family education;Balance training;Cognitive remediation/compensation    OT Goals(Current goals can be found in the care plan section) Acute Rehab OT Goals Patient Stated Goal: go home OT Goal Formulation: With patient Time For Goal Achievement: 02/25/22 Potential to Achieve Goals: Good  OT Frequency: Min 2X/week    Co-evaluation              AM-PAC OT "6 Clicks" Daily Activity     Outcome Measure Help from another person eating meals?: None Help from another person taking care of personal grooming?: A Little Help from another person toileting, which includes using toliet, bedpan, or urinal?: A Little Help from another person bathing (including washing, rinsing, drying)?: A Little Help from another person to put on and taking off regular upper body clothing?: A Little Help from another person to put on and taking off regular lower body clothing?: A Little 6 Click Score: 19   End of Session Equipment Utilized During Treatment: Gait belt;Rolling walker (2 wheels) Nurse Communication: Mobility status  Activity Tolerance: Patient tolerated treatment well Patient left: with call bell/phone within reach;in chair;with family/visitor present  OT Visit Diagnosis: Unsteadiness on feet (R26.81);Muscle weakness (generalized) (M62.81);Other  abnormalities of gait and mobility (R26.89);Other symptoms and signs involving cognitive function                Time: 1510-1530 OT Time Calculation (min): 20  min Charges:  OT General Charges $OT Visit: 1 Visit OT Evaluation $OT Eval Moderate Complexity: 1 Mod  Elder Cyphers, OTR/L [facility] Acute Rehabilitation Office: [phone]  Magnus Ivan 02/11/2022, 5:03 PM

## 2022-02-11 NOTE — Progress Notes (Signed)
Daily Progress Note   Patient Name: Roberto Ponce       Date: 02/11/2022 DOB: 05/30/1950  Age: 71 y.o. MRN#: [id number] Attending Physician: Bonnielee Haff, MD Primary Care Physician: Kristen Loader, FNP Admit Date: 02/08/2022 Length of Stay: 3 days  Reason for Consultation/Follow-up: Establishing goals of care  HPI/Patient Profile:  71 year old male with extensive past medical history inclusive of pulmonary fibrosis, chronic hypoxic respiratory failure, CAD, and BOOP.   Admitted with AMS CT head (-).   (+) acute resp failure on ventilator, receiving tx for PNA.   Palliative care consulted to further address goals of care in the setting of underlying disease with associated poor prognosis.   Subjective:   Subjective: Chart Reviewed. Updates received. Patient Assessed. Created space and opportunity for patient  and family to explore thoughts and feelings regarding current medical situation.  Today's Discussion: Today I saw the patient at the bedside.  He is resting comfortably bed with his wife at the bedside.  Today he states he is breathing okay.  He denies any pain.  Overall he is doing a bit better than yesterday.  His wife is happy that he is no longer intubated.  His appetite is little bit better and ate between 50 to 100% of his past emails.  His wife states that he is going to be going to pulmonary rehab and working with the dietitian.  We discussed DNR status and plans for no intubation and he confirmed this.  At this point we discussed taking it a day at a time and allowing time for outcomes.  Depending on how he does in the coming days we will consider outpatient palliative versus hospice at discharge.  If he does have a substantial decline then we can consider comfort care in place.  I provided emotional and general support through therapeutic listening, empathy, sharing of stories, and other techniques. I answered all questions and addressed all concerns to the best of my  ability.  Review of Systems  Constitutional:  Positive for fatigue.  Respiratory:  Negative for cough and shortness of breath.   Cardiovascular:  Negative for chest pain.  Gastrointestinal:  Negative for abdominal pain, nausea and vomiting.    Objective:   Vital Signs:  BP 117/79 (BP Location: Right Arm)   Pulse 80   Temp 97.8 F (36.6 C) (Oral)   Resp 20   Ht '5\' 9"'$  (1.753 m)   Wt 65.8 kg   SpO2 100%   BMI 21.42 kg/m   Physical Exam: Physical Exam Vitals and nursing note reviewed.  Constitutional:      Appearance: He is ill-appearing. He is not toxic-appearing.  HENT:     Head: Normocephalic and atraumatic.  Cardiovascular:     Rate and Rhythm: Normal rate and regular rhythm.  Pulmonary:     Effort: Pulmonary effort is normal. No respiratory distress.     Breath sounds: Decreased air movement present. No wheezing or rhonchi.  Abdominal:     General: Abdomen is flat. There is no distension.     Palpations: Abdomen is soft.     Tenderness: There is no abdominal tenderness.  Skin:    General: Skin is warm and dry.  Neurological:     General: No focal deficit present.     Mental Status: He is alert.  Psychiatric:        Mood and Affect: Mood normal.        Behavior: Behavior normal.     Palliative Assessment/Data:  30-40%    Existing Vynca/ACP Documentation: [place] document (completed today 02/11/22)  Assessment & Plan:   Impression: Present on Admission:  Acute on chronic respiratory failure ([place])  71 year old male with chronic comorbidities and acute presentations as described above.  He is admitted in respiratory failure in the setting of progressive interstitial pulmonary fibrosis and underwent one-way extubation yesterday.  He seems to be tolerating well thus far.  He does appear weak and debilitated.  Per family, plans are for pulmonary rehab and working with the dietitian.  They are agreeable to taking it 1 day at a time.  He understands a high risk for  decompensation.  However, they confirm (this is a patient) wishes for DNR/DNI.  Overall long-term prognosis poor.  SUMMARY OF RECOMMENDATIONS   Remain DNR No reintubation Continue medical treatments otherwise Time for outcomes Work toward goal of discharge with rehab to strengthen PMT will continue to follow  Symptom Management:  Per primary team PMT is available to assist as needed  Code Status: DNR  Prognosis: Unable to determine  Discharge Planning: To Be Determined  Discussed with: Patient, patient's family, medical team, nursing team  Thank you for allowing [place] to participate in the care of AKITO BOOMHOWER PMT will continue to support holistically.  Billing based on MDM: High  Problems Addressed: One acute or chronic illness or injury that poses a threat to life or bodily function  Amount and/or Complexity of Data: Category 3:Discussion of management or test interpretation with external physician/other qualified health care professional/appropriate source (not separately reported)  Risks: Decision not to resuscitate or to de-escalate care because of poor prognosis (confirmed DNR/DNI)  Roberto Field, NP Palliative Medicine Team  Team Phone # [phone] (Nights/Weekends)  10/18/2020, 8:17 AM

## 2022-02-11 NOTE — Progress Notes (Addendum)
NAME:  Roberto Ponce, MRN:  [id number], DOB:  1950-03-31, LOS: 3 ADMISSION DATE:  02/08/2022, CONSULTATION DATE: 02/08/2022 REFERRING MD: Emergency department physician, CHIEF COMPLAINT: Acute respiratory failure, altered mental status metabolic disarray.  History of Present Illness:  71 year old male with extensive past medical history is well-documented below who was recently seen in the emergency room for a fall with a negative CT of the head.  He was sent home and returns with altered mental status respiratory distress negative CT of the head and required intubation for airway protection and oxygenation.  His past medical history significant for Boop, interstitial pulmonary fibrosis with progression and he is followed by Dr. Madie Reno.  He is currently intubated and hypotensive of note he is on Aldactone and Lasix as an outpatient along with antihypertensives.  He has been fluid resuscitated to 2.5 L and is currently on Levophed drip.  He is also remarkable for being hyponatremic.  We will continue fluid resuscitation admit to the intensive care unit  Pertinent  Medical History   Past Medical History:  Diagnosis Date   Asthma    BOOP (bronchiolitis obliterans with organizing pneumonia) ([place])    Chronic respiratory failure ([place])    Coronary artery disease    DVT (deep venous thrombosis) ([facility])    Glaucoma    Heart failure with mid-range ejection fraction ([place])    History of gallstones    Hyperlipidemia    Hyponatremia    Hypotension due to medication 06/22/2021   LVH (left ventricular hypertrophy)    MI (myocardial infarction) ([place])    Mitral regurgitation    Mixed dyslipidemia    Pneumomediastinum ([place])    Protein calorie malnutrition ([place])    Shock ([place]) 06/22/2021   Testicular cancer ([place])    Venous stasis    Significant Hospital Events: Including procedures, antibiotic start and stop dates in addition to other pertinent events   02/08/2022 CT of the head was  negative 02/08/2022 intubated 12/23 Extubated 12/24 Transferred to 3w  Interim History / Subjective:  Extubated 12/23 Currently on oxygen at 3 L  with sats of 99%  Net + 1963 cc 350 cc UO last 24 hours Objective   Blood pressure 108/62, pulse 83, temperature 98 F (36.7 C), temperature source Oral, resp. rate 17, height '5\' 9"'$  (1.753 m), weight 65.8 kg, SpO2 99 %.        Intake/Output Summary (Last 24 hours) at 02/11/2022 1253 Last data filed at 02/11/2022 0846 Gross per 24 hour  Intake 331.75 ml  Output 1600 ml  Net -1268.25 ml   Filed Weights   02/09/22 0421 02/10/22 0321 02/11/22 0218  Weight: 63.6 kg 64.9 kg 65.8 kg    Examination: General: Elderly, does not appear to be in distress is awake alert interactive HENT: Moist oral mucosa, No JVD No LAD Lungs: Bilateral chest excursion, Rhonchi/ crackles  bilaterally Cardiovascular: S1-S2 appreciated, No RMG Extremities: Bilateral lower extremity edema, warm to touch with brick cap refill Neuro: Following commands, MAE x 4, alert to self and family  Resolved Hospital Problem list     Assessment & Plan:   Respiratory failure History of Boop Possible pneumonia -Day 4 of antibiotics -On steroids - Extubated 12/23 and now on 3 L [place] with sats of 97% - CXR in am and prn  Discussed with family - Family at bedside, updated  Hyponatremia -Being corrected>> 132 on 12/24 -Discontinue saline -Does have a history of SIADH  Coronary artery disease S/p CABG and stent  Heart failure with reduced ejection fraction -Home meds on hold  Appreciate palliative care involvement with care DNR status  Discussed with family at bedside 12/24 Patient also awake and interactive and able to contribute to conversation  Has follow up appointment with Dr. Erin Fulling in the [address] Office 02/28/2022  Best Practice (right click and "Reselect all SmartList Selections" daily)   Diet/type: Regular diet DVT prophylaxis: prophylactic  heparin  GI prophylaxis: PPI Lines: Central line Foley:  Yes, and it is still needed Code Status:  DNR Last date of multidisciplinary goals of care discussion [12/21: see IPAL note, palliative consult] Family updated 12/24  Labs   CBC: Recent Labs  Lab 02/08/22 0844 02/08/22 0914 02/08/22 1305 02/08/22 1437 02/09/22 0226 02/09/22 0251 02/11/22 0315  WBC 6.5  --   --  8.0  --  9.3 8.8  NEUTROABS 4.2  --   --   --   --   --   --   HGB 13.4   < > 13.3 10.6* 11.6* 10.7* 10.5*  HCT 41.3   < > 39.0 31.8* 34.0* 32.2* 33.7*  MCV 81.9  --   --  80.1  --  78.9* 84.9  PLT 201  --   --  158  --  182 146*   < > = values in this interval not displayed.    Basic Metabolic Panel: Recent Labs  Lab 02/08/22 0844 02/08/22 0914 02/08/22 1437 02/09/22 0226 02/09/22 0251 02/09/22 1639 02/10/22 0250 02/10/22 1700 02/11/22 0315  NA 119*   < >  --  122* 122* 130* 130*  --  132*  K 5.9*   < >  --  4.3 4.1 4.1 4.0  --  4.4  CL 75*  --   --   --  84* 91* 94*  --  89*  CO2 36*  --   --   --  30 32 32  --  36*  GLUCOSE 152*  --   --   --  258* 127* 159*  --  105*  BUN 14  --   --   --  6* 9 14  --  8  CREATININE 0.40*  --  0.45*  --  0.47* 0.42* 0.37*  --  0.39*  CALCIUM 9.2  --   --   --  8.0* 8.0* 8.2*  --  9.0  MG  --   --   --   --  1.5* 2.1 1.9 1.6* 1.8  PHOS  --   --   --   --  3.1 4.3 3.1 2.8 4.1   < > = values in this interval not displayed.   GFR: Estimated Creatinine Clearance: 80 mL/min (A) (by C-G formula based on SCr of 0.39 mg/dL (L)). Recent Labs  Lab 02/08/22 0844 02/08/22 1437 02/09/22 0251 02/11/22 0315  PROCALCITON  --  <0.10  --   --   WBC 6.5 8.0 9.3 8.8  LATICACIDVEN 0.7 1.3  --   --     Liver Function Tests: Recent Labs  Lab 02/08/22 0844  AST 25  ALT 18  ALKPHOS 71  BILITOT 0.5  PROT 7.1  ALBUMIN 3.4*   No results for input(s): "LIPASE", "AMYLASE" in the last 168 hours. Recent Labs  Lab 02/08/22 1437  AMMONIA 36*    ABG    Component Value  Date/Time   PHART 7.479 (H) 02/09/2022 0226   PCO2ART 42.9 02/09/2022 0226   PO2ART 177 (H) 02/09/2022 0226  HCO3 31.7 (H) 02/09/2022 0226   TCO2 33 (H) 02/09/2022 0226   ACIDBASEDEF 0.1 06/20/2021 0200   O2SAT 100 02/09/2022 0226     Coagulation Profile: No results for input(s): "INR", "PROTIME" in the last 168 hours.  Cardiac Enzymes: No results for input(s): "CKTOTAL", "CKMB", "CKMBINDEX", "TROPONINI" in the last 168 hours.  HbA1C: Hgb A1c MFr Bld  Date/Time Value Ref Range Status  06/20/2021 08:03 PM 5.4 4.8 - 5.6 % Final    Comment:    (NOTE) Pre diabetes:          5.7%-6.4%  Diabetes:              >6.4%  Glycemic control for   <7.0% adults with diabetes     CBG: Recent Labs  Lab 02/10/22 1938 02/10/22 2349 02/11/22 0325 02/11/22 0752 02/11/22 1134  GLUCAP 156* 110* 91 96 123*   Past Medical History:  He,  has a past medical history of Asthma, BOOP (bronchiolitis obliterans with organizing pneumonia) ([place]), Chronic respiratory failure ([place]), Coronary artery disease, DVT (deep venous thrombosis) ([place]), Glaucoma, Heart failure with mid-range ejection fraction ([place]), History of gallstones, Hyperlipidemia, Hyponatremia, Hypotension due to medication (06/22/2021), LVH (left ventricular hypertrophy), MI (myocardial infarction) ([place]), Mitral regurgitation, Mixed dyslipidemia, Pneumomediastinum ([place]), Protein calorie malnutrition ([place]), Shock ([place]) (06/22/2021), Testicular cancer ([place]), and Venous stasis.   Surgical History:   Past Surgical History:  Procedure Laterality Date   CARDIAC CATHETERIZATION  09/09/1986   EF 56%   CARDIOVASCULAR STRESS TEST  12/16/2007   EF 54% NO ISCHEMIA   CHOLECYSTECTOMY     COLONOSCOPY WITH PROPOFOL N/A 09/30/2021   Procedure: COLONOSCOPY WITH PROPOFOL;  Surgeon: Otis Brace, MD;  Location: [facility];  Service: Gastroenterology;  Laterality: N/A;   CORONARY ARTERY BYPASS GRAFT     LIMA TO THE LAD AND RIMA TO RIGHT CORONARY  ARTERY   CORONARY STENT INTERVENTION N/A 02/14/2021   Procedure: CORONARY STENT INTERVENTION;  Surgeon: Sherren Mocha, MD;  Location: [facility];  Service: Cardiovascular;  Laterality: N/A;   ESOPHAGOGASTRODUODENOSCOPY (EGD) WITH PROPOFOL N/A 09/30/2021   Procedure: ESOPHAGOGASTRODUODENOSCOPY (EGD) WITH PROPOFOL;  Surgeon: Otis Brace, MD;  Location: [facility];  Service: Gastroenterology;  Laterality: N/A;   ORCHIECTOMY Right 04/19/2021   Procedure: RIGHT RADICAL INGUINAL ORCHIECTOMY;  Surgeon: Lucas Mallow, MD;  Location: [facility];  Service: Urology;  Laterality: Right;  1 HR FOR CASE   RIGHT/LEFT HEART CATH AND CORONARY/GRAFT ANGIOGRAPHY N/A 02/14/2021   Procedure: RIGHT/LEFT HEART CATH AND CORONARY/GRAFT ANGIOGRAPHY;  Surgeon: Sherren Mocha, MD;  Location: [facility];  Service: Cardiovascular;  Laterality: N/A;   TEE WITHOUT CARDIOVERSION N/A 09/02/2020   Procedure: TRANSESOPHAGEAL ECHOCARDIOGRAM (TEE);  Surgeon: Werner Lean, MD;  [facility];  Service: Cardiovascular;  Laterality: N/A;   US ECHOCARDIOGRAPHY  12/12/2007   EF 55-60%   VASECTOMY  1988     Social History:   reports that he has never smoked. He has never used smokeless tobacco. He reports that he does not drink alcohol and does not use drugs.   Family History:  His family history includes Heart attack in his father; Stroke in his father.   Allergies No Known Allergies     Magdalen Spatz, MSN, AGACNP-BC [place] for personal pager PCCM on call pager [phone]  02/11/2022 1:34 PM

## 2022-02-12 ENCOUNTER — Inpatient Hospital Stay (HOSPITAL_COMMUNITY): Payer: PPO

## 2022-02-12 DIAGNOSIS — J9621 Acute and chronic respiratory failure with hypoxia: Secondary | ICD-10-CM | POA: Diagnosis not present

## 2022-02-12 DIAGNOSIS — J84112 Idiopathic pulmonary fibrosis: Secondary | ICD-10-CM | POA: Diagnosis not present

## 2022-02-12 DIAGNOSIS — J189 Pneumonia, unspecified organism: Secondary | ICD-10-CM | POA: Diagnosis not present

## 2022-02-12 DIAGNOSIS — Z66 Do not resuscitate: Secondary | ICD-10-CM | POA: Diagnosis not present

## 2022-02-12 DIAGNOSIS — Z515 Encounter for palliative care: Secondary | ICD-10-CM | POA: Diagnosis not present

## 2022-02-12 DIAGNOSIS — R531 Weakness: Secondary | ICD-10-CM

## 2022-02-12 DIAGNOSIS — Z7189 Other specified counseling: Secondary | ICD-10-CM | POA: Diagnosis not present

## 2022-02-12 LAB — GLUCOSE, CAPILLARY: Glucose-Capillary: 98 mg/dL (ref 70–99)

## 2022-02-12 LAB — BASIC METABOLIC PANEL
Anion gap: 5 (ref 5–15)
BUN: 15 mg/dL (ref 8–23)
CO2: 43 mmol/L — ABNORMAL HIGH (ref 22–32)
Calcium: 8.9 mg/dL (ref 8.9–10.3)
Chloride: 84 mmol/L — ABNORMAL LOW (ref 98–111)
Creatinine, Ser: 0.46 mg/dL — ABNORMAL LOW (ref 0.61–1.24)
GFR, Estimated: >60 mL/min (ref >60)
Glucose, Bld: 124 mg/dL — ABNORMAL HIGH (ref 70–99)
Potassium: 4.3 mmol/L (ref 3.5–5.1)
Sodium: 132 mmol/L — ABNORMAL LOW (ref 135–145)

## 2022-02-12 LAB — MAGNESIUM: Magnesium: 1.9 mg/dL (ref 1.7–2.4)

## 2022-02-12 NOTE — Progress Notes (Signed)
Daily Progress Note   Patient Name: Roberto Ponce       Date: 02/12/2022 DOB: 24-Sep-1950  Age: 71 y.o. MRN#: [id number] Attending Physician: Bonnielee Haff, MD Primary Care Physician: Kristen Loader, FNP Admit Date: 02/08/2022 Length of Stay: 4 days  Reason for Consultation/Follow-up: Establishing goals of care  HPI/Patient Profile:  71 year old male with extensive past medical history inclusive of pulmonary fibrosis, chronic hypoxic respiratory failure, CAD, and BOOP.   Admitted with AMS CT head (-).   (+) acute resp failure on ventilator, receiving tx for PNA.   Palliative care consulted to further address goals of care in the setting of underlying disease with associated poor prognosis.   Subjective:   Subjective: Chart Reviewed. Updates received. Patient Assessed. Created space and opportunity for patient  and family to explore thoughts and feelings regarding current medical situation.  Today's Discussion: Today I saw the patient at the bedside.  He is resting comfortably bed with his wife at the bedside.  His wife is assisting him to eat breakfast. Today he states he is breathing okay.  He denies any pain.  Overall he is doing a bit better than yesterday.  He does say he is "wiped out" today indicating more fatigue.  We discussed DNR status and plans for no intubation and he confirmed this.  At this point we discussed continued taking it a day at a time and allowing time for outcomes.  Depending on how he does in the coming days we will consider outpatient palliative versus hospice at discharge.  If he does have a substantial decline then we can consider comfort care in place.  I provided emotional and general support through therapeutic listening, empathy, sharing of stories, and other techniques. I answered all questions and addressed all concerns to the best of my ability.  Review of Systems  Constitutional:  Positive for fatigue (increased).  Respiratory:  Negative for  cough and shortness of breath.   Cardiovascular:  Negative for chest pain.  Gastrointestinal:  Negative for abdominal pain, nausea and vomiting.    Objective:   Vital Signs:  BP 113/72 (BP Location: Right Arm)   Pulse 95   Temp (!) 96.9 F (36.1 C) (Axillary)   Resp 15   Ht '5\' 9"'$  (1.753 m)   Wt 65.8 kg   SpO2 98%   BMI 21.42 kg/m   Physical Exam: Physical Exam Vitals and nursing note reviewed.  Constitutional:      Appearance: He is ill-appearing. He is not toxic-appearing.  HENT:     Head: Normocephalic and atraumatic.  Cardiovascular:     Rate and Rhythm: Normal rate and regular rhythm.  Pulmonary:     Effort: Pulmonary effort is normal. No respiratory distress.     Breath sounds: Decreased air movement present. No wheezing or rhonchi.  Abdominal:     General: Abdomen is flat. There is no distension.     Palpations: Abdomen is soft.     Tenderness: There is no abdominal tenderness.  Skin:    General: Skin is warm and dry.  Neurological:     General: No focal deficit present.     Mental Status: He is alert.  Psychiatric:        Mood and Affect: Mood normal.        Behavior: Behavior normal.     Palliative Assessment/Data: 30-40%    Existing Vynca/ACP Documentation: GOC document (completed 02/11/22)  Assessment & Plan:   Impression: Present on Admission:  Acute on  chronic respiratory failure ([place])  71 year old male with chronic comorbidities and acute presentations as described above.  He is admitted in respiratory failure in the setting of progressive interstitial pulmonary fibrosis and underwent one-way extubation yesterday.  He seems to be tolerating well thus far.  He does appear weak and debilitated.  Per family, plans are for pulmonary rehab and working with the dietitian.  They are agreeable to taking it 1 day at a time.  He understands a high risk for decompensation.  However, they confirm (as does the patient) wishes for DNR/DNI.  Overall long-term  prognosis poor.    SUMMARY OF RECOMMENDATIONS   Remain DNR No reintubation Continue medical treatments otherwise Time for outcomes Work toward goal of discharge with home health rehab to strengthen PMT will continue to follow peripherally as goals are clear Please notify us of any significant clinical change or new palliative needs  Symptom Management:  Per primary team PMT is available to assist as needed  Code Status: DNR  Prognosis: Unable to determine  Discharge Planning: To Be Determined  Discussed with: Patient, patient's family, medical team, nursing team  Thank you for allowing [place] to participate in the care of SIGISMUND CROSS PMT will continue to support holistically.  Billing based on MDM: High  Problems Addressed: One acute or chronic illness or injury that poses a threat to life or bodily function  Amount and/or Complexity of Data: Category 3:Discussion of management or test interpretation with external physician/other qualified health care professional/appropriate source (not separately reported)  Risks: Decision not to resuscitate or to de-escalate care because of poor prognosis (confirmed DNR/DNI)  Walden Field, NP Palliative Medicine Team  Team Phone # [phone] (Nights/Weekends)  10/18/2020, 8:17 AM

## 2022-02-12 NOTE — Progress Notes (Signed)
[facility] PROGRESS NOTE   Roberto Ponce OYD:[id number] DOB: May 14, 1950 DOA: 02/08/2022  PCP: Kristen Loader, FNP  Brief History/Interval Summary: 71 year old Caucasian male with a past medical history of Boop, interstitial pulmonary fibrosis with progression, chronic hypoxic respiratory failure, coronary artery disease who presented to the emergency department with altered mental status after sustaining a recent fall.  No acute findings were noted on CT head.  However patient had to be intubated for airway protection.  He was admitted to the ICU.  He was stabilized.  Palliative care was consulted.  Subsequently extubated.  He was changed over to DNR status with no plans for reintubation.  Consultants: Pulmonology.  Palliative care  Procedures: Intubation    Subjective/Interval History: No complaints offered.  Wife is at bedside.  No worsening in shortness of breath.      Assessment/Plan:  Acute on chronic respiratory failure with hypoxia/history of Boop/interstitial pulmonary fibrosis Patient was intubated in the ICU.  Extubated on 12/23.  Seems to be stable from a respiratory standpoint. Was on 5 L of oxygen by nasal cannula.  Dropped down to 3 L.  Saturations in the 90s.  Could continue to wean down.  Uses between 2 to 3 L of oxygen at home. Pulmonology continues to follow. Remains on Solu-Medrol.  Community-acquired pneumonia There was concern for pneumonia over and above his chronic lung issues.  He was started on ceftriaxone and azithromycin.  He will complete his 5-day course today. Chest x-ray was done this morning and does not show any appreciable changes compared to previous films.  Hyponatremia Possibly due to SIADH considering his chronic lung issues.  Sodium levels have improved.  Recheck labs tomorrow.  Normocytic anemia No evidence of overt bleeding.  Hypomagnesemia Corrected  Coronary artery disease status post CABG Stable.  Echocardiogram  showed normal systolic function without any regional wall motion abnormalities.  Continue aspirin beta-blocker.  Goals of care Palliative care following.  Currently DNR.  No plans for reintubation.  Severe protein calorie malnutrition Nutrition Problem: Severe Malnutrition Etiology: chronic illness (CHF, pulmonary fibrosis) Signs/Symptoms: severe fat depletion, severe muscle depletion, percent weight loss (13.9% weight loss in 7 months) Percent weight loss: 13.9 % Interventions: MVI, Tube feeding   DVT Prophylaxis: Subcutaneous heparin Code Status: DNR Family Communication: Discussed with the patient and his wife Disposition Plan: Start mobilizing.  Hopefully discharge home when cleared by pulmonology.  Seen by PT and OT.  Home health recommended.  Status is: Inpatient Remains inpatient appropriate because: Acute on chronic hypoxic respiratory failure      Medications: Scheduled:  aspirin EC  81 mg Oral Daily   brimonidine  1 drop Left Eye BID   carvedilol  3.125 mg Oral BID WC   Chlorhexidine Gluconate Cloth  6 each Topical Q0600   cyanocobalamin  1,000 mcg Oral Daily   dapagliflozin propanediol  10 mg Oral Daily   dorzolamide-timolol  1 drop Both Eyes BID   ezetimibe  10 mg Oral Daily   heparin  5,000 Units Subcutaneous Q8H   latanoprost  1 drop Both Eyes QHS   methylPREDNISolone (SOLU-MEDROL) injection  40 mg Intravenous Q12H   multivitamin with minerals  1 tablet Per Tube Daily   pantoprazole  40 mg Oral Daily   sodium chloride flush  10-40 mL Intracatheter Q12H   spironolactone  12.5 mg Oral Daily   Continuous:  sodium chloride     cefTRIAXone (ROCEPHIN)  IV 2 g (02/11/22 0843)   EHM:CNOBSJGG sodium, fentaNYL (  SUBLIMAZE) injection, fentaNYL (SUBLIMAZE) injection, midazolam, mouth rinse, mouth rinse, mouth rinse, polyethylene glycol, sodium chloride flush  Antibiotics: Anti-infectives (From admission, onward)    Start     Dose/Rate Route Frequency Ordered Stop    02/08/22 0900  cefTRIAXone (ROCEPHIN) 2 g in sodium chloride 0.9 % 100 mL IVPB        2 g 200 mL/hr over 30 Minutes Intravenous Every 24 hours 02/08/22 0855 02/13/22 0859   02/08/22 0900  azithromycin (ZITHROMAX) 500 mg in sodium chloride 0.9 % 250 mL IVPB        500 mg 250 mL/hr over 60 Minutes Intravenous Every 24 hours 02/08/22 0855 02/12/22 0941       Objective:  Vital Signs  Vitals:   02/11/22 2104 02/12/22 0006 02/12/22 0330 02/12/22 0740  BP: 113/61 109/66 107/64 113/72  Pulse: 84 79 84 95  Resp: '19 17 18 15  '$ Temp: 98.6 F (37 C) 98.7 F (37.1 C) 97.8 F (36.6 C) (!) 96.9 F (36.1 C)  TempSrc: Oral Oral  Axillary  SpO2: 100% 100% 100% 98%  Weight:      Height:        Intake/Output Summary (Last 24 hours) at 02/12/2022 1012 Last data filed at 02/12/2022 0830 Gross per 24 hour  Intake --  Output 1150 ml  Net -1150 ml    Filed Weights   02/09/22 0421 02/10/22 0321 02/11/22 0218  Weight: 63.6 kg 64.9 kg 65.8 kg    General appearance: Awake alert.  In no distress Resp: Normal effort at rest.  Coarse breath sounds with crackles bilaterally.  No wheezing or rhonchi. Cardio: S1-S2 is normal regular.  No S3-S4.  No rubs murmurs or bruit GI: Abdomen is soft.  Nontender nondistended.  Bowel sounds are present normal.  No masses organomegaly Extremities: No edema.     Lab Results:  Data Reviewed: I have personally reviewed following labs and reports of the imaging studies  CBC: Recent Labs  Lab 02/08/22 0844 02/08/22 0914 02/08/22 1305 02/08/22 1437 02/09/22 0226 02/09/22 0251 02/11/22 0315  WBC 6.5  --   --  8.0  --  9.3 8.8  NEUTROABS 4.2  --   --   --   --   --   --   HGB 13.4   < > 13.3 10.6* 11.6* 10.7* 10.5*  HCT 41.3   < > 39.0 31.8* 34.0* 32.2* 33.7*  MCV 81.9  --   --  80.1  --  78.9* 84.9  PLT 201  --   --  158  --  182 146*   < > = values in this interval not displayed.     Basic Metabolic Panel: Recent Labs  Lab 02/08/22 0844  02/08/22 0914 02/08/22 1437 02/09/22 0226 02/09/22 0251 02/09/22 0251 02/09/22 1639 02/10/22 0250 02/10/22 1700 02/11/22 0315 02/11/22 1700 02/12/22 0305  NA 119*   < >  --  122* 122*  --  130* 130*  --  132*  --   --   K 5.9*   < >  --  4.3 4.1  --  4.1 4.0  --  4.4  --   --   CL 75*  --   --   --  84*  --  91* 94*  --  89*  --   --   CO2 36*  --   --   --  30  --  32 32  --  36*  --   --  GLUCOSE 152*  --   --   --  258*  --  127* 159*  --  105*  --   --   BUN 14  --   --   --  6*  --  9 14  --  8  --   --   CREATININE 0.40*  --  0.45*  --  0.47*  --  0.42* 0.37*  --  0.39*  --   --   CALCIUM 9.2  --   --   --  8.0*  --  8.0* 8.2*  --  9.0  --   --   MG  --   --   --   --  1.5*   < > 2.1 1.9 1.6* 1.8 1.9 1.9  PHOS  --   --   --   --  3.1  --  4.3 3.1 2.8 4.1  --   --    < > = values in this interval not displayed.     GFR: Estimated Creatinine Clearance: 80 mL/min (A) (by C-G formula based on SCr of 0.39 mg/dL (L)).  Liver Function Tests: Recent Labs  Lab 02/08/22 0844  AST 25  ALT 18  ALKPHOS 71  BILITOT 0.5  PROT 7.1  ALBUMIN 3.4*      Recent Labs  Lab 02/08/22 1437  AMMONIA 36*     CBG: Recent Labs  Lab 02/11/22 0752 02/11/22 1134 02/11/22 1630 02/11/22 2102 02/12/22 0003  GLUCAP 96 123* 105* 121* 98       Recent Results (from the past 240 hour(s))  Culture, blood (routine x 2)     Status: None (Preliminary result)   Collection Time: 02/08/22  8:40 AM   Specimen: BLOOD  Result Value Ref Range Status   Specimen Description BLOOD SITE NOT SPECIFIED  Final   Special Requests   Final    BOTTLES DRAWN AEROBIC AND ANAEROBIC Blood Culture adequate volume   Culture   Final    NO GROWTH 4 DAYS Performed at [facility], [place] [address] [place], [place] [address]    Report Status PENDING  Incomplete  Resp panel by RT-PCR (RSV, Flu A&B, Covid)     Status: None   Collection Time: 02/08/22  8:41 AM   Specimen: Nasal Swab  Result Value Ref  Range Status   SARS Coronavirus 2 by RT PCR NEGATIVE NEGATIVE Final    Comment: (NOTE) SARS-CoV-2 target nucleic acids are NOT DETECTED.  The SARS-CoV-2 RNA is generally detectable in upper respiratory specimens during the acute phase of infection. The lowest concentration of SARS-CoV-2 viral copies this assay can detect is 138 copies/mL. A negative result does not preclude SARS-Cov-2 infection and should not be used as the sole basis for treatment or other patient management decisions. A negative result may occur with  improper specimen collection/handling, submission of specimen other than nasopharyngeal swab, presence of viral mutation(s) within the areas targeted by this assay, and inadequate number of viral copies(<138 copies/mL). A negative result must be combined with clinical observations, patient history, and epidemiological information. The expected result is Negative.  Fact Sheet for Patients:  EntrepreneurPulse.com.au  Fact Sheet for Healthcare Providers:  IncredibleEmployment.be  This test is no t yet approved or cleared by the [place] FDA and  has been authorized for detection and/or diagnosis of SARS-CoV-2 by FDA under an Emergency Use Authorization (EUA). This EUA will remain  in effect (meaning this test can be used) for  the duration of the COVID-19 declaration under Section 564(b)(1) of the Act, 21 U.S.C.section 360bbb-3(b)(1), unless the authorization is terminated  or revoked sooner.       Influenza A by PCR NEGATIVE NEGATIVE Final   Influenza B by PCR NEGATIVE NEGATIVE Final    Comment: (NOTE) The Xpert Xpress SARS-CoV-2/FLU/RSV plus assay is intended as an aid in the diagnosis of influenza from Nasopharyngeal swab specimens and should not be used as a sole basis for treatment. Nasal washings and aspirates are unacceptable for Xpert Xpress SARS-CoV-2/FLU/RSV testing.  Fact Sheet for  Patients: EntrepreneurPulse.com.au  Fact Sheet for Healthcare Providers: IncredibleEmployment.be  This test is not yet approved or cleared by the [place] FDA and has been authorized for detection and/or diagnosis of SARS-CoV-2 by FDA under an Emergency Use Authorization (EUA). This EUA will remain in effect (meaning this test can be used) for the duration of the COVID-19 declaration under Section 564(b)(1) of the Act, 21 U.S.C. section 360bbb-3(b)(1), unless the authorization is terminated or revoked.     Resp Syncytial Virus by PCR NEGATIVE NEGATIVE Final    Comment: (NOTE) Fact Sheet for Patients: EntrepreneurPulse.com.au  Fact Sheet for Healthcare Providers: IncredibleEmployment.be  This test is not yet approved or cleared by the [place] FDA and has been authorized for detection and/or diagnosis of SARS-CoV-2 by FDA under an Emergency Use Authorization (EUA). This EUA will remain in effect (meaning this test can be used) for the duration of the COVID-19 declaration under Section 564(b)(1) of the Act, 21 U.S.C. section 360bbb-3(b)(1), unless the authorization is terminated or revoked.  Performed at [facility], [place] [address], [place], [place] [address]   Culture, Respiratory w Gram Stain (tracheal aspirate)     Status: None   Collection Time: 02/08/22 11:11 AM   Specimen: Tracheal Aspirate; Respiratory  Result Value Ref Range Status   Specimen Description TRACHEAL ASPIRATE  Final   Special Requests NONE  Final   Gram Stain   Final    MODERATE WBC PRESENT,BOTH PMN AND MONONUCLEAR FEW GRAM POSITIVE COCCI IN PAIRS IN CLUSTERS    Culture   Final    FEW Normal respiratory flora-no Staph aureus or Pseudomonas seen Performed at [facility], [address], [place], [place] [address]    Report Status 02/11/2022 FINAL  Final  MRSA Next Gen by PCR, Nasal     Status: None    Collection Time: 02/08/22  3:15 PM   Specimen: Nasal Mucosa; Nasal Swab  Result Value Ref Range Status   MRSA by PCR Next Gen NOT DETECTED NOT DETECTED Final    Comment: (NOTE) The GeneXpert MRSA Assay (FDA approved for NASAL specimens only), is one component of a comprehensive MRSA colonization surveillance program. It is not intended to diagnose MRSA infection nor to guide or monitor treatment for MRSA infections. Test performance is not FDA approved in patients less than 27 years old. Performed at [facility], [place] [address], [place],  [address]   Culture, blood (Routine X 2) w Reflex to ID Panel     Status: None (Preliminary result)   Collection Time: 02/08/22  5:05 PM   Specimen: BLOOD RIGHT HAND  Result Value Ref Range Status   Specimen Description BLOOD RIGHT HAND  Final   Special Requests   Final    BOTTLES DRAWN AEROBIC AND ANAEROBIC Blood Culture results may not be optimal due to an inadequate volume of blood received in culture bottles   Culture  Final    NO GROWTH 4 DAYS Performed at [facility], [place] [address], [place], [place] [address]    Report Status PENDING  Incomplete  Culture, blood (Routine X 2) w Reflex to ID Panel     Status: None (Preliminary result)   Collection Time: 02/08/22  5:07 PM   Specimen: BLOOD RIGHT HAND  Result Value Ref Range Status   Specimen Description BLOOD RIGHT HAND  Final   Special Requests   Final    BOTTLES DRAWN AEROBIC AND ANAEROBIC Blood Culture results may not be optimal due to an inadequate volume of blood received in culture bottles   Culture   Final    NO GROWTH 4 DAYS Performed at [facility], [place] [address] [place], [place] [address]    Report Status PENDING  Incomplete  Urine Culture     Status: None   Collection Time: 02/08/22  5:38 PM   Specimen: Urine, Clean Catch  Result Value Ref Range Status   Specimen Description URINE, CLEAN CATCH  Final   Special Requests NONE  Final   Culture    Final    NO GROWTH Performed at [facility], [place] [address], [place], [place] [address]    Report Status 02/10/2022 FINAL  Final      Radiology Studies: DG CHEST PORT 1 VIEW  Result Date: 02/12/2022 CLINICAL DATA:  In patient. Follow-up exam. Respiratory distress. Pneumonia. EXAM: PORTABLE CHEST 1 VIEW COMPARISON:  02/08/2022 and older exams. FINDINGS: Coarse interstitial and hazy airspace lung opacities persist throughout the left lung, less prominent in the right lung, mostly peripheral, all stable. No new lung abnormalities. Probable small left pleural effusion. No pneumothorax. Endotracheal tube and nasal/orogastric tube have been removed. Left sided PICC, new, has its tip in the lower superior vena cava. IMPRESSION: 1. No change in lung aeration. Persistent interstitial and airspace lung opacities, greatest on the left. 2. Status post extubation.  Well-positioned left-sided PICC. Electronically Signed   By: Lajean Manes M.D.   On: 02/12/2022 10:04       LOS: 4 days   [facility] Pager on [email or web address]  02/12/2022, 10:12 AM

## 2022-02-12 NOTE — TOC Initial Note (Signed)
Transition of Care [facility]) - Initial/Assessment Note    Patient Details  Name: Roberto Ponce MRN: [id number] Date of Birth: 1950-03-08  Transition of Care [facility]) CM/SW Contact:    Pollie Friar, RN Phone Number: 02/12/2022, 11:43 AM  Clinical Narrative:                 Pt is from home with his spouse who says she is with him all the time.  Pt has oxygen at home via [place].  Spouse helps with his medications and denies any issues. Spouse provides needed transportation.  Pt with recommendations for home health services. This was arranged with [facility]. Information on the AVS.  TOC following.  Expected Discharge Plan: [place] Barriers to Discharge: Continued Medical Work up   Patient Goals and CMS Choice   CMS Medicare.gov Compare Post Acute Care list provided to:: Patient Represenative (must comment) Choice offered to / list presented to : Spouse      Expected Discharge Plan and Services   Discharge Planning Services: CM Consult Post Acute Care Choice: [place] arrangements for the past 2 months: [place]: PT, OT HH Agency: [facility] Date [place]: 02/12/22   Representative spoke with at [place]: Amy  Prior Living Arrangements/Services Living arrangements for the past 2 months: [place] with:: Spouse Patient language and need for interpreter reviewed:: Yes Do you feel safe going back to the place where you live?: Yes      Need for Family Participation in Patient Care: Yes (Comment) Care giver support system in place?: Yes (comment) Current home services: DME (wheelchair/ walker/ walking stick/ oxygen with [employer]) Criminal Activity/Legal Involvement Pertinent to Current Situation/Hospitalization: No - Comment as needed  Activities of Daily Living Home Assistive Devices/Equipment: Blood pressure cuff, CBG Meter, Cane (specify quad or  straight), Eyeglasses, Walker (specify type), Wheelchair ADL Screening (condition at time of admission) Patient's cognitive ability adequate to safely complete daily activities?: Yes Is the patient deaf or have difficulty hearing?: No Does the patient have difficulty seeing, even when wearing glasses/contacts?: No Does the patient have difficulty concentrating, remembering, or making decisions?: No Patient able to express need for assistance with ADLs?: Yes Does the patient have difficulty dressing or bathing?: Yes Independently performs ADLs?: No Communication: Independent Dressing (OT): Needs assistance Is this a change from baseline?: Pre-admission baseline Grooming: Needs assistance Is this a change from baseline?: Pre-admission baseline Feeding: Independent Bathing: Needs assistance Is this a change from baseline?: Pre-admission baseline Toileting: Needs assistance Is this a change from baseline?: Pre-admission baseline In/Out Bed: Needs assistance Is this a change from baseline?: Pre-admission baseline Walks in Home: Independent with device (comment) Does the patient have difficulty walking or climbing stairs?: Yes Weakness of Legs: Both Weakness of Arms/Hands: Both  Permission Sought/Granted                  Emotional Assessment Appearance:: Appears stated age         Psych Involvement: No (comment)  Admission diagnosis:  Hyponatremia [E87.1] Hypothermia, initial encounter [T68.XXXA] Acute on chronic respiratory failure (HCC) [J96.20] Acute respiratory failure with hypoxia and hypercapnia (HCC) [J96.01, J96.02] Pneumonia of left lung due to infectious organism, unspecified part of lung [J18.9] Patient Active Problem List   Diagnosis Date Noted  Acute on chronic respiratory failure ([place]) 02/08/2022   Acute stroke due to ischemia ([place]) 02/01/2022   Iron deficiency anemia 11/07/2021   Protein-calorie malnutrition, severe 09/30/2021   GI bleed 09/28/2021    Aortic stenosis    Hyponatremia    Glaucoma    History of testicular cancer    History of SIADH    DVT (deep venous thrombosis) (HCC)    Acute hypoxemic respiratory failure ([place]) 07/05/2021   MR (mitral regurgitation) 06/23/2021   Abnormal LFTs 06/23/2021   Symptomatic anemia 06/23/2021   Leucocytosis 06/23/2021   ARDS (adult respiratory distress syndrome) ([place])    Testis cancer ([place]) 05/18/2021   S/P CABG x 2 02/15/2021   Non-rheumatic mitral regurgitation 02/14/2021   Mitral regurgitation 07/22/2020   Primary open angle glaucoma (POAG) of left eye, severe stage 05/27/2017   Coronary artery disease 07/09/2016   CAD (coronary artery disease) of artery bypass graft 05/27/2013   Hyperlipidemia 05/27/2013   LVH (left ventricular hypertrophy) 05/27/2013   Venous stasis 05/27/2013   Asthmatic bronchitis 05/27/2013   PCP:  Kristen Loader, FNP Pharmacy:   [facility] [place], [place] AT [facility] [address] [place] [address] Phone: [phone] Fax: [facility] [place], [place] AT [facility] [place] [place] [address] Phone: [phone] Fax: [phone]     Social Determinants of Health (SDOH) Social History: SDOH Screenings   Food Insecurity: No Food Insecurity (02/11/2022)  Housing: Low Risk  (02/11/2022)  Transportation Needs: No Transportation Needs (02/11/2022)  Utilities: Not At Risk (02/11/2022)  Depression (PHQ2-9): Medium Risk (01/19/2022)  Tobacco Use: Low Risk  (02/11/2022)   SDOH Interventions:     Readmission Risk Interventions     No data to display

## 2022-02-13 ENCOUNTER — Telehealth: Payer: Self-pay | Admitting: Pulmonary Disease

## 2022-02-13 ENCOUNTER — Encounter (HOSPITAL_COMMUNITY)
Admission: RE | Admit: 2022-02-13 | Discharge: 2022-02-13 | Disposition: A | Discharge status: Home / Self Care | Payer: PPO | Source: Ambulatory Visit | Attending: Pulmonary Disease | Admitting: Pulmonary Disease

## 2022-02-13 DIAGNOSIS — J9602 Acute respiratory failure with hypercapnia: Secondary | ICD-10-CM | POA: Diagnosis not present

## 2022-02-13 DIAGNOSIS — J84112 Idiopathic pulmonary fibrosis: Secondary | ICD-10-CM | POA: Diagnosis not present

## 2022-02-13 DIAGNOSIS — J189 Pneumonia, unspecified organism: Secondary | ICD-10-CM | POA: Diagnosis not present

## 2022-02-13 DIAGNOSIS — J9601 Acute respiratory failure with hypoxia: Secondary | ICD-10-CM | POA: Diagnosis not present

## 2022-02-13 DIAGNOSIS — J9621 Acute and chronic respiratory failure with hypoxia: Secondary | ICD-10-CM | POA: Diagnosis not present

## 2022-02-13 LAB — CULTURE, BLOOD (ROUTINE X 2)
Culture: NO GROWTH
Culture: NO GROWTH
Culture: NO GROWTH
Special Requests: ADEQUATE

## 2022-02-13 MED ORDER — PREDNISONE 20 MG PO TABS
40.0000 mg | ORAL_TABLET | Freq: Every day | ORAL | Status: DC
  Administered 2022-02-14: 40 mg via ORAL
  Filled 2022-02-13: qty 2

## 2022-02-13 MED ORDER — LIP MEDEX EX OINT
TOPICAL_OINTMENT | CUTANEOUS | Status: DC | PRN
  Filled 2022-02-13: qty 7

## 2022-02-13 NOTE — Telephone Encounter (Signed)
Please arrange for hospital follow up appointment with Dr. Erin Fulling or an APP in 2-3 weeks.

## 2022-02-13 NOTE — Progress Notes (Signed)
[facility] PROGRESS NOTE   Roberto Ponce ERX:[id number] DOB: 1951-02-09 DOA: 02/08/2022  PCP: Kristen Loader, FNP  Brief History/Interval Summary: 71 year old Caucasian male with a past medical history of Boop, interstitial pulmonary fibrosis with progression, chronic hypoxic respiratory failure, coronary artery disease who presented to the emergency department with altered mental status after sustaining a recent fall.  No acute findings were noted on CT head.  However patient had to be intubated for airway protection.  He was admitted to the ICU.  He was stabilized.  Palliative care was consulted.  Subsequently extubated.  He was changed over to DNR status with no plans for reintubation.  Consultants: Pulmonology.  Palliative care  Procedures: Intubation    Subjective/Interval History: Patient noted to be lethargic but easily arousable.  Follows all my commands.  Denies any chest pain.  Wife is at the bedside.     Assessment/Plan:  Acute on chronic respiratory failure with hypoxia/history of Boop/interstitial pulmonary fibrosis Patient was intubated in the ICU.  Extubated on 12/23.  Was on 5 L of oxygen by nasal cannula.  Weaned down to 2 to 3 L. Uses between 2 to 3 L of oxygen at home. Pulmonology continues to follow. Remains on Solu-Medrol. Respiratory status appears to be stable for the most part.  Community-acquired pneumonia There was concern for pneumonia over and above his chronic lung issues.  He was started on ceftriaxone and azithromycin.  He will complete his 5-day course today. Chest x-ray was done yesterday morning and did not show any appreciable changes compared to previous films.  Hyponatremia Possibly due to SIADH considering his chronic lung issues.  Sodium levels have improved.    Lethargy No obvious focal deficits noted on examination.  He is afebrile.  His sodium level is stable.  Continue to monitor for now.  Normocytic anemia No  evidence for overt bleeding.  Hypomagnesemia Corrected  Coronary artery disease status post CABG Stable.  Echocardiogram showed normal systolic function without any regional wall motion abnormalities.  Continue aspirin beta-blocker.  Goals of care Palliative care following.  Currently DNR.  No plans for reintubation.  Severe protein calorie malnutrition Nutrition Problem: Severe Malnutrition Etiology: chronic illness (CHF, pulmonary fibrosis) Signs/Symptoms: severe fat depletion, severe muscle depletion, percent weight loss (13.9% weight loss in 7 months) Percent weight loss: 13.9 % Interventions: MVI, Tube feeding   DVT Prophylaxis: Subcutaneous heparin Code Status: DNR Family Communication: Discussed with the patient and his wife Disposition Plan: Start mobilizing.  Hopefully discharge home when cleared by pulmonology.  Seen by PT and OT.  Home health recommended.  Status is: Inpatient Remains inpatient appropriate because: Acute on chronic hypoxic respiratory failure      Medications: Scheduled:  aspirin EC  81 mg Oral Daily   brimonidine  1 drop Left Eye BID   carvedilol  3.125 mg Oral BID WC   Chlorhexidine Gluconate Cloth  6 each Topical Q0600   cyanocobalamin  1,000 mcg Oral Daily   dapagliflozin propanediol  10 mg Oral Daily   dorzolamide-timolol  1 drop Both Eyes BID   ezetimibe  10 mg Oral Daily   heparin  5,000 Units Subcutaneous Q8H   latanoprost  1 drop Both Eyes QHS   methylPREDNISolone (SOLU-MEDROL) injection  40 mg Intravenous Q12H   multivitamin with minerals  1 tablet Per Tube Daily   pantoprazole  40 mg Oral Daily   sodium chloride flush  10-40 mL Intracatheter Q12H   Continuous:  sodium chloride  VZD:GLOVFIEP sodium, fentaNYL (SUBLIMAZE) injection, fentaNYL (SUBLIMAZE) injection, midazolam, mouth rinse, mouth rinse, mouth rinse, polyethylene glycol, sodium chloride flush  Antibiotics: Anti-infectives (From admission, onward)    Start      Dose/Rate Route Frequency Ordered Stop   02/08/22 0900  cefTRIAXone (ROCEPHIN) 2 g in sodium chloride 0.9 % 100 mL IVPB        2 g 200 mL/hr over 30 Minutes Intravenous Every 24 hours 02/08/22 0855 02/12/22 1220   02/08/22 0900  azithromycin (ZITHROMAX) 500 mg in sodium chloride 0.9 % 250 mL IVPB        500 mg 250 mL/hr over 60 Minutes Intravenous Every 24 hours 02/08/22 0855 02/12/22 0941       Objective:  Vital Signs  Vitals:   02/12/22 2025 02/12/22 2333 02/13/22 0319 02/13/22 0855  BP: (!) 105/59 109/68 106/65 110/68  Pulse:  82 79 80  Resp: '16 18 16 15  '$ Temp: (!) 97.5 F (36.4 C) 98.2 F (36.8 C) 97.8 F (36.6 C) (!) 96.4 F (35.8 C)  TempSrc: Axillary Oral  Axillary  SpO2: 98% 100% 100% 100%  Weight:      Height:       No intake or output data in the 24 hours ending 02/13/22 0943  Filed Weights   02/09/22 0421 02/10/22 0321 02/11/22 0218  Weight: 63.6 kg 64.9 kg 65.8 kg    General appearance: Noted to be awake but lethargic.  Follows commands.  No distress noted. Resp: Crackles about laterally left greater than right.  Normal effort at rest Cardio: S1-S2 is normal regular.  No S3-S4.  No rubs murmurs or bruit GI: Abdomen is soft.  Nontender nondistended.  Bowel sounds are present normal.  No masses organomegaly Extremities: No edema.  Moving all of his extremities. No facial asymmetry.  Strength is equal bilateral upper and lower extremities.   Lab Results:  Data Reviewed: I have personally reviewed following labs and reports of the imaging studies  CBC: Recent Labs  Lab 02/08/22 0844 02/08/22 0914 02/08/22 1305 02/08/22 1437 02/09/22 0226 02/09/22 0251 02/11/22 0315  WBC 6.5  --   --  8.0  --  9.3 8.8  NEUTROABS 4.2  --   --   --   --   --   --   HGB 13.4   < > 13.3 10.6* 11.6* 10.7* 10.5*  HCT 41.3   < > 39.0 31.8* 34.0* 32.2* 33.7*  MCV 81.9  --   --  80.1  --  78.9* 84.9  PLT 201  --   --  158  --  182 146*   < > = values in this interval not  displayed.     Basic Metabolic Panel: Recent Labs  Lab 02/09/22 0251 02/09/22 1639 02/10/22 0250 02/10/22 1700 02/11/22 0315 02/11/22 1700 02/12/22 0305 02/12/22 1822  NA 122* 130* 130*  --  132*  --   --  132*  K 4.1 4.1 4.0  --  4.4  --   --  4.3  CL 84* 91* 94*  --  89*  --   --  84*  CO2 30 32 32  --  36*  --   --  43*  GLUCOSE 258* 127* 159*  --  105*  --   --  124*  BUN 6* 9 14  --  8  --   --  15  CREATININE 0.47* 0.42* 0.37*  --  0.39*  --   --  0.46*  CALCIUM 8.0* 8.0* 8.2*  --  9.0  --   --  8.9  MG 1.5* 2.1 1.9 1.6* 1.8 1.9 1.9  --   PHOS 3.1 4.3 3.1 2.8 4.1  --   --   --      GFR: Estimated Creatinine Clearance: 80 mL/min (A) (by C-G formula based on SCr of 0.46 mg/dL (L)).  Liver Function Tests: Recent Labs  Lab 02/08/22 0844  AST 25  ALT 18  ALKPHOS 71  BILITOT 0.5  PROT 7.1  ALBUMIN 3.4*      Recent Labs  Lab 02/08/22 1437  AMMONIA 36*     CBG: Recent Labs  Lab 02/11/22 0752 02/11/22 1134 02/11/22 1630 02/11/22 2102 02/12/22 0003  GLUCAP 96 123* 105* 121* 98       Recent Results (from the past 240 hour(s))  Culture, blood (routine x 2)     Status: None (Preliminary result)   Collection Time: 02/08/22  8:40 AM   Specimen: BLOOD  Result Value Ref Range Status   Specimen Description BLOOD SITE NOT SPECIFIED  Final   Special Requests   Final    BOTTLES DRAWN AEROBIC AND ANAEROBIC Blood Culture adequate volume   Culture   Final    NO GROWTH 4 DAYS Performed at [facility], [place] [address], [place], [place] [address]    Report Status PENDING  Incomplete  Resp panel by RT-PCR (RSV, Flu A&B, Covid)     Status: None   Collection Time: 02/08/22  8:41 AM   Specimen: Nasal Swab  Result Value Ref Range Status   SARS Coronavirus 2 by RT PCR NEGATIVE NEGATIVE Final    Comment: (NOTE) SARS-CoV-2 target nucleic acids are NOT DETECTED.  The SARS-CoV-2 RNA is generally detectable in upper respiratory specimens during the acute  phase of infection. The lowest concentration of SARS-CoV-2 viral copies this assay can detect is 138 copies/mL. A negative result does not preclude SARS-Cov-2 infection and should not be used as the sole basis for treatment or other patient management decisions. A negative result may occur with  improper specimen collection/handling, submission of specimen other than nasopharyngeal swab, presence of viral mutation(s) within the areas targeted by this assay, and inadequate number of viral copies(<138 copies/mL). A negative result must be combined with clinical observations, patient history, and epidemiological information. The expected result is Negative.  Fact Sheet for Patients:  EntrepreneurPulse.com.au  Fact Sheet for Healthcare Providers:  IncredibleEmployment.be  This test is no t yet approved or cleared by the [place] FDA and  has been authorized for detection and/or diagnosis of SARS-CoV-2 by FDA under an Emergency Use Authorization (EUA). This EUA will remain  in effect (meaning this test can be used) for the duration of the COVID-19 declaration under Section 564(b)(1) of the Act, 21 U.S.C.section 360bbb-3(b)(1), unless the authorization is terminated  or revoked sooner.       Influenza A by PCR NEGATIVE NEGATIVE Final   Influenza B by PCR NEGATIVE NEGATIVE Final    Comment: (NOTE) The Xpert Xpress SARS-CoV-2/FLU/RSV plus assay is intended as an aid in the diagnosis of influenza from Nasopharyngeal swab specimens and should not be used as a sole basis for treatment. Nasal washings and aspirates are unacceptable for Xpert Xpress SARS-CoV-2/FLU/RSV testing.  Fact Sheet for Patients: EntrepreneurPulse.com.au  Fact Sheet for Healthcare Providers: IncredibleEmployment.be  This test is not yet approved or cleared by the [place] FDA and has been authorized for detection and/or diagnosis of  SARS-CoV-2 by FDA under an Emergency Use Authorization (EUA). This EUA will remain in effect (meaning this test can be used) for the duration of the COVID-19 declaration under Section 564(b)(1) of the Act, 21 U.S.C. section 360bbb-3(b)(1), unless the authorization is terminated or revoked.     Resp Syncytial Virus by PCR NEGATIVE NEGATIVE Final    Comment: (NOTE) Fact Sheet for Patients: EntrepreneurPulse.com.au  Fact Sheet for Healthcare Providers: IncredibleEmployment.be  This test is not yet approved or cleared by the [place] FDA and has been authorized for detection and/or diagnosis of SARS-CoV-2 by FDA under an Emergency Use Authorization (EUA). This EUA will remain in effect (meaning this test can be used) for the duration of the COVID-19 declaration under Section 564(b)(1) of the Act, 21 U.S.C. section 360bbb-3(b)(1), unless the authorization is terminated or revoked.  Performed at [facility], [place] [address], [place], [place] [address]   Culture, Respiratory w Gram Stain (tracheal aspirate)     Status: None   Collection Time: 02/08/22 11:11 AM   Specimen: Tracheal Aspirate; Respiratory  Result Value Ref Range Status   Specimen Description TRACHEAL ASPIRATE  Final   Special Requests NONE  Final   Gram Stain   Final    MODERATE WBC PRESENT,BOTH PMN AND MONONUCLEAR FEW GRAM POSITIVE COCCI IN PAIRS IN CLUSTERS    Culture   Final    FEW Normal respiratory flora-no Staph aureus or Pseudomonas seen Performed at [facility], [address], [place], [place] [address]    Report Status 02/11/2022 FINAL  Final  MRSA Next Gen by PCR, Nasal     Status: None   Collection Time: 02/08/22  3:15 PM   Specimen: Nasal Mucosa; Nasal Swab  Result Value Ref Range Status   MRSA by PCR Next Gen NOT DETECTED NOT DETECTED Final    Comment: (NOTE) The GeneXpert MRSA Assay (FDA approved for NASAL specimens only), is one component of a  comprehensive MRSA colonization surveillance program. It is not intended to diagnose MRSA infection nor to guide or monitor treatment for MRSA infections. Test performance is not FDA approved in patients less than 11 years old. Performed at [facility], [place] [address] [place], [place] [address]   Culture, blood (Routine X 2) w Reflex to ID Panel     Status: None (Preliminary result)   Collection Time: 02/08/22  5:05 PM   Specimen: BLOOD RIGHT HAND  Result Value Ref Range Status   Specimen Description BLOOD RIGHT HAND  Final   Special Requests   Final    BOTTLES DRAWN AEROBIC AND ANAEROBIC Blood Culture results may not be optimal due to an inadequate volume of blood received in culture bottles   Culture   Final    NO GROWTH 4 DAYS Performed at [facility], [place] [address] [place], [place] [address]    Report Status PENDING  Incomplete  Culture, blood (Routine X 2) w Reflex to ID Panel     Status: None (Preliminary result)   Collection Time: 02/08/22  5:07 PM   Specimen: BLOOD RIGHT HAND  Result Value Ref Range Status   Specimen Description BLOOD RIGHT HAND  Final   Special Requests   Final    BOTTLES DRAWN AEROBIC AND ANAEROBIC Blood Culture results may not be optimal due to an inadequate volume of blood received in culture bottles   Culture   Final    NO GROWTH 4 DAYS Performed at [facility], [address], [place], [place] [address]    Report Status PENDING  Incomplete  Urine Culture     Status: None   Collection Time: 02/08/22  5:38 PM   Specimen: Urine, Clean Catch  Result Value Ref Range Status   Specimen Description URINE, CLEAN CATCH  Final   Special Requests NONE  Final   Culture   Final    NO GROWTH Performed at [facility], [place] [address], [place], [place] [address]    Report Status 02/10/2022 FINAL  Final      Radiology Studies: DG CHEST PORT 1 VIEW  Result Date: 02/12/2022 CLINICAL DATA:  In patient. Follow-up exam.  Respiratory distress. Pneumonia. EXAM: PORTABLE CHEST 1 VIEW COMPARISON:  02/08/2022 and older exams. FINDINGS: Coarse interstitial and hazy airspace lung opacities persist throughout the left lung, less prominent in the right lung, mostly peripheral, all stable. No new lung abnormalities. Probable small left pleural effusion. No pneumothorax. Endotracheal tube and nasal/orogastric tube have been removed. Left sided PICC, new, has its tip in the lower superior vena cava. IMPRESSION: 1. No change in lung aeration. Persistent interstitial and airspace lung opacities, greatest on the left. 2. Status post extubation.  Well-positioned left-sided PICC. Electronically Signed   By: Lajean Manes M.D.   On: 02/12/2022 10:04       LOS: 5 days   [facility] Pager on [email or web address]  02/13/2022, 9:43 AM

## 2022-02-13 NOTE — Progress Notes (Addendum)
     Referral previously received for [name] for goals of care discussion. Noted most recent palliative in-person assessment dated 02/12/2022 at which time it was recommended to follow from a distance/chart check.  Chart reviewed for Recent provider notes, vitals, labs, and imaging and updates received from RN.   At this time patient appears stable, no changes in aeration on chest XRay yesterday, BMP last night stable. No plan for in person follow-up atoday. Please contact the palliative medicine provider on service for any new/urgent needs that require our assistance with this patient.  Thank you for your referral and allowing PMT to assist in [place] care.   Walden Field, NP Palliative Medicine Team Phone: [phone]  NO CHARGE

## 2022-02-13 NOTE — Care Management Important Message (Signed)
Important Message  Patient Details  Name: Roberto Ponce MRN: [id number] Date of Birth: 15-Sep-1950   Medicare Important Message Given:  Yes     [name] 02/13/2022, 3:24 PM

## 2022-02-13 NOTE — Progress Notes (Signed)
Mobility Specialist Progress Note   02/13/22 1430  Mobility  Activity Transferred from bed to chair (LE exercises)  Level of Assistance Contact guard assist, steadying assist  Assistive Device Front wheel walker  Distance Ambulated (ft) 5 ft  Range of Motion/Exercises Active;All extremities  Activity Response Tolerated well   Pre Ambulation: SpO2 97% 3LO2 During Ambulation: SpO2 75% 3LO2 Post Ambulation: SpO2 98% 3LO2  Patient received in supine and eager to participate. Presented with increased awareness to deficits and improved activity tolerance. Was independent with bed mobility and stood with minimal HHA, more so to steady. Took steps to recliner with min guard and steady gait, then completed multiple bouts of seated LE exercises. Noted during transfer, patient with 2/4 DOE and oxygen desaturated to 75% on 3LO2. With adequate rest recovery and pursed lip breathing, oxygen saturation maintained >95% on 3LO2. Tolerated without complaint or incident. Was left in reclienr with all needs met, call bell in reach.   Roberto Ponce, BS EXP Mobility Specialist Please contact via SecureChat or Rehab office at [phone]'

## 2022-02-13 NOTE — Progress Notes (Signed)
NAME:  Roberto Ponce, MRN:  [id number], DOB:  Jan 13, 1951, LOS: 5 ADMISSION DATE:  02/08/2022, CONSULTATION DATE: 02/08/2022 REFERRING MD: Emergency department physician, CHIEF COMPLAINT: Acute respiratory failure, altered mental status metabolic disarray.  History of Present Illness:  71 year old male with extensive past medical history is well-documented below who was recently seen in the emergency room for a fall with a negative CT of the head.  He was sent home and returns with altered mental status respiratory distress negative CT of the head and required intubation for airway protection and oxygenation.  His past medical history significant for Boop, interstitial pulmonary fibrosis with progression and he is followed by Dr. Madie Reno.  He is currently intubated and hypotensive of note he is on Aldactone and Lasix as an outpatient along with antihypertensives.  He has been fluid resuscitated to 2.5 L and is currently on Levophed drip.  He is also remarkable for being hyponatremic.  We will continue fluid resuscitation admit to the intensive care unit  Pertinent  Medical History   Past Medical History:  Diagnosis Date   Asthma    BOOP (bronchiolitis obliterans with organizing pneumonia) ([place])    Chronic respiratory failure ([place])    Coronary artery disease    DVT (deep venous thrombosis) ([facility])    Glaucoma    Heart failure with mid-range ejection fraction ([place])    History of gallstones    Hyperlipidemia    Hyponatremia    Hypotension due to medication 06/22/2021   LVH (left ventricular hypertrophy)    MI (myocardial infarction) ([place])    Mitral regurgitation    Mixed dyslipidemia    Pneumomediastinum ([place])    Protein calorie malnutrition ([place])    Shock ([place]) 06/22/2021   Testicular cancer ([place])    Venous stasis    Significant Hospital Events: Including procedures, antibiotic start and stop dates in addition to other pertinent events   02/08/2022 CT of the head was  negative 02/08/2022 intubated 12/23 Extubated 12/24 Transferred to 3w  Interim History / Subjective:  Currently sitting up in bed eating.  May be close to going home transition steroids to p.o. Objective   Blood pressure 110/68, pulse 80, temperature (!) 96.4 F (35.8 C), temperature source Axillary, resp. rate 15, height '5\' 9"'$  (1.753 m), weight 65.8 kg, SpO2 100 %.       No intake or output data in the 24 hours ending 02/13/22 0941  Filed Weights   02/09/22 0421 02/10/22 0321 02/11/22 0218  Weight: 63.6 kg 64.9 kg 65.8 kg    Examination: General: Somewhat stunned 71 year old male HEENT: MM pink/moist no JVD Neuro: Grossly intact follows commands although slowly CV: Heart sounds are regular PULM: Diminished in the bases currently on 3 L nasal cannula sats 95% GI: soft, bsx4 active  GU: Amber urine Extremities: warm/dry, 1+ edema  Skin: no rashes or lesions   Resolved Hospital Problem list     Assessment & Plan:   Respiratory failure History of Boop Possible pneumonia Day 4 Rocephin Currently on Solu-Medrol 40 mg IV every 12 can transition this to 40 mg prednisone daily and taper over 10 days. Day 5 of antimicrobial therapy No chest x-ray for 02/13/2022 Currently on 3 L nasal cannula     Hyponatremia Recent Labs  Lab 02/10/22 0250 02/11/22 0315 02/12/22 1822  NA 130* 132* 132*    Corrected  No labs from today  Coronary artery disease S/p CABG and stent Heart failure with reduced ejection fraction Home meds currently  on hold  Appreciate palliative care involvement with care DNR status  Discussed with family at bedside 12/26 Patient also awake and interactive and able to contribute to conversation  Has follow up appointment with Dr. Erin Fulling in the [address] Office 02/28/2022  Best Practice (right click and "Reselect all SmartList Selections" daily)   Diet/type: Regular diet DVT prophylaxis: prophylactic heparin  GI prophylaxis: PPI Lines:  Central line Foley:  Yes, and it is still needed Code Status:  DNR Last date of multidisciplinary goals of care discussion [12/21: see IPAL note, palliative consult] Family updated 12/26  Labs   CBC: Recent Labs  Lab 02/08/22 0844 02/08/22 0914 02/08/22 1305 02/08/22 1437 02/09/22 0226 02/09/22 0251 02/11/22 0315  WBC 6.5  --   --  8.0  --  9.3 8.8  NEUTROABS 4.2  --   --   --   --   --   --   HGB 13.4   < > 13.3 10.6* 11.6* 10.7* 10.5*  HCT 41.3   < > 39.0 31.8* 34.0* 32.2* 33.7*  MCV 81.9  --   --  80.1  --  78.9* 84.9  PLT 201  --   --  158  --  182 146*   < > = values in this interval not displayed.    Basic Metabolic Panel: Recent Labs  Lab 02/09/22 0251 02/09/22 1639 02/10/22 0250 02/10/22 1700 02/11/22 0315 02/11/22 1700 02/12/22 0305 02/12/22 1822  NA 122* 130* 130*  --  132*  --   --  132*  K 4.1 4.1 4.0  --  4.4  --   --  4.3  CL 84* 91* 94*  --  89*  --   --  84*  CO2 30 32 32  --  36*  --   --  43*  GLUCOSE 258* 127* 159*  --  105*  --   --  124*  BUN 6* 9 14  --  8  --   --  15  CREATININE 0.47* 0.42* 0.37*  --  0.39*  --   --  0.46*  CALCIUM 8.0* 8.0* 8.2*  --  9.0  --   --  8.9  MG 1.5* 2.1 1.9 1.6* 1.8 1.9 1.9  --   PHOS 3.1 4.3 3.1 2.8 4.1  --   --   --    GFR: Estimated Creatinine Clearance: 80 mL/min (A) (by C-G formula based on SCr of 0.46 mg/dL (L)). Recent Labs  Lab 02/08/22 0844 02/08/22 1437 02/09/22 0251 02/11/22 0315  PROCALCITON  --  <0.10  --   --   WBC 6.5 8.0 9.3 8.8  LATICACIDVEN 0.7 1.3  --   --     Liver Function Tests: Recent Labs  Lab 02/08/22 0844  AST 25  ALT 18  ALKPHOS 71  BILITOT 0.5  PROT 7.1  ALBUMIN 3.4*   No results for input(s): "LIPASE", "AMYLASE" in the last 168 hours. Recent Labs  Lab 02/08/22 1437  AMMONIA 36*    ABG    Component Value Date/Time   PHART 7.479 (H) 02/09/2022 0226   PCO2ART 42.9 02/09/2022 0226   PO2ART 177 (H) 02/09/2022 0226   HCO3 31.7 (H) 02/09/2022 0226   TCO2 33  (H) 02/09/2022 0226   ACIDBASEDEF 0.1 06/20/2021 0200   O2SAT 100 02/09/2022 0226     Coagulation Profile: No results for input(s): "INR", "PROTIME" in the last 168 hours.  Cardiac Enzymes: No results for input(s): "CKTOTAL", "CKMB", "  CKMBINDEX", "TROPONINI" in the last 168 hours.  HbA1C: Hgb A1c MFr Bld  Date/Time Value Ref Range Status  06/20/2021 08:03 PM 5.4 4.8 - 5.6 % Final    Comment:    (NOTE) Pre diabetes:          5.7%-6.4%  Diabetes:              >6.4%  Glycemic control for   <7.0% adults with diabetes     CBG: Recent Labs  Lab 02/11/22 0752 02/11/22 1134 02/11/22 1630 02/11/22 2102 02/12/22 0003  GLUCAP 96 [place]    Steve Giulianna Rocha ACNP Acute Care Nurse Practitioner [place] Please consult Amion 02/13/2022, 9:41 AM

## 2022-02-14 ENCOUNTER — Other Ambulatory Visit (HOSPITAL_COMMUNITY): Payer: Self-pay

## 2022-02-14 ENCOUNTER — Encounter: Payer: Self-pay | Admitting: Oncology

## 2022-02-14 DIAGNOSIS — J9621 Acute and chronic respiratory failure with hypoxia: Secondary | ICD-10-CM | POA: Diagnosis not present

## 2022-02-14 LAB — BASIC METABOLIC PANEL
Anion gap: 3 — ABNORMAL LOW (ref 5–15)
BUN: 16 mg/dL (ref 8–23)
CO2: 44 mmol/L — ABNORMAL HIGH (ref 22–32)
Calcium: 8.6 mg/dL — ABNORMAL LOW (ref 8.9–10.3)
Chloride: 87 mmol/L — ABNORMAL LOW (ref 98–111)
Creatinine, Ser: 0.37 mg/dL — ABNORMAL LOW (ref 0.61–1.24)
GFR, Estimated: >60 mL/min (ref >60)
Glucose, Bld: 93 mg/dL (ref 70–99)
Potassium: 4 mmol/L (ref 3.5–5.1)
Sodium: 134 mmol/L — ABNORMAL LOW (ref 135–145)

## 2022-02-14 LAB — CBC
HCT: 37.3 % — ABNORMAL LOW (ref 39.0–52.0)
Hemoglobin: 11.1 g/dL — ABNORMAL LOW (ref 13.0–17.0)
MCH: 26.2 pg (ref 26.0–34.0)
MCHC: 29.8 g/dL — ABNORMAL LOW (ref 30.0–36.0)
MCV: 88.2 fL (ref 80.0–100.0)
Platelets: 151 10*3/uL (ref 150–400)
RBC: 4.23 MIL/uL (ref 4.22–5.81)
RDW: 16.6 % — ABNORMAL HIGH (ref 11.5–15.5)
WBC: 11.7 10*3/uL — ABNORMAL HIGH (ref 4.0–10.5)
nRBC: 0 % (ref 0.0–0.2)

## 2022-02-14 LAB — TSH: TSH: 2.173 u[IU]/mL (ref 0.350–4.500)

## 2022-02-14 LAB — T4, FREE: Free T4: 1.02 ng/dL (ref 0.61–1.12)

## 2022-02-14 LAB — CORTISOL: Cortisol, Plasma: 12.7 ug/dL

## 2022-02-14 MED ORDER — BOOST PLUS PO LIQD
237.0000 mL | Freq: Three times a day (TID) | ORAL | Status: DC
  Filled 2022-02-14 (×3): qty 237

## 2022-02-14 MED ORDER — PREDNISONE 20 MG PO TABS
ORAL_TABLET | ORAL | 0 refills | Status: AC
  Filled 2022-02-14: qty 35, 28d supply, fill #0

## 2022-02-14 MED ORDER — ASPIRIN 81 MG PO TBEC
81.0000 mg | DELAYED_RELEASE_TABLET | Freq: Every day | ORAL | 12 refills | Status: DC
  Filled 2022-02-14: qty 30, 30d supply, fill #0

## 2022-02-14 NOTE — Progress Notes (Signed)
Nutrition Follow-up  DOCUMENTATION CODES:  Severe malnutrition in context of chronic illness  INTERVENTION:  Continue regular diet, change to ordering assist Boost Plus TID to provide 360kcal and 14g of protein MVI with minerals daily  NUTRITION DIAGNOSIS:  Severe Malnutrition related to chronic illness (CHF, pulmonary fibrosis) as evidenced by severe fat depletion, severe muscle depletion, percent weight loss (13.9% weight loss in 7 months). - remains applicable  GOAL:  Patient will meet greater than or equal to 90% of their needs - progressing, diet advanced, supplements   MONITOR:  Vent status, Labs, Weight trends, TF tolerance  REASON FOR ASSESSMENT:  Ventilator, Consult Enteral/tube feeding initiation and management  ASSESSMENT:  71 year old male who presented to the ED on 12/21 with AMS. Pt required intubation. PMH of BOOP, interstitial pulmonary fibrosis, CAD, DVT, CHF, HLD, malnutrition, testicular cancer.  12/21 - intubated 12/23 - extubated, diet advanced to regular 12/24 - transferred out of ICU  Pt extubated 12/23 and OGT removed/TF discontinued. Regular diet in place, no meals recorded but pt has ordered all meals according to dining software.   Noted that pt routinely drinks ensure 2x/d at home per previous RD note, will order nutrition supplements to aid in meeting needs. Therapies are recommending outpatient services.   Nutritionally Relevant Medications: Scheduled Meds:  cyanocobalamin  1,000 mcg Oral Daily   ezetimibe  10 mg Oral Daily   multivitamin with minerals  1 tablet Per Tube Daily   pantoprazole  40 mg Oral Daily   predniSONE  40 mg Oral Q breakfast   Continuous Infusions:  sodium chloride     PRN Meds: docusate sodium, polyethylene glycol  Labs Reviewed: Na 134, chloride 87 CBG ranges from 91-123 mg/dL over the last 24 hours  NUTRITION - FOCUSED PHYSICAL EXAM: Flowsheet Row Most Recent Value  Orbital Region Moderate depletion  Upper  Arm Region Severe depletion  Thoracic and Lumbar Region Severe depletion  Buccal Region Unable to assess  Temple Region Moderate depletion  Clavicle Bone Region Severe depletion  Clavicle and Acromion Bone Region Severe depletion  Scapular Bone Region Moderate depletion  Dorsal Hand Moderate depletion  Patellar Region Severe depletion  Anterior Thigh Region Severe depletion  Posterior Calf Region Severe depletion  Edema (RD Assessment) None  Hair Reviewed  Eyes Reviewed  Mouth Reviewed  Skin Reviewed  Nails Reviewed    Diet Order:   Diet Order             Diet regular Room service appropriate? Yes; Fluid consistency: Thin  Diet effective now                   EDUCATION NEEDS:  Education needs have been addressed  Skin:  Skin Assessment: Reviewed RN Assessment  Last BM:  12/27 - type 2  Height:  Ht Readings from Last 1 Encounters:  02/08/22 '5\' 9"'$  (1.753 m)    Weight:  Wt Readings from Last 1 Encounters:  02/11/22 65.8 kg    Ideal Body Weight:  72.7 kg  BMI:  Body mass index is 21.42 kg/m.  Estimated Nutritional Needs:  Kcal:  1850-2050 Protein:  90-110 grams Fluid:  1.8-2.0 L    Ranell Patrick, RD, LDN Clinical Dietitian RD pager # available in [place]  After hours/weekend pager # available in [facility]

## 2022-02-14 NOTE — TOC Transition Note (Signed)
Transition of Care [facility]) - CM/SW Discharge Note   Patient Details  Name: Roberto Ponce MRN: [id number] Date of Birth: 1950-04-30  Transition of Care [facility]) CM/SW Contact:  Pollie Friar, RN Phone Number: 02/14/2022, 1:47 PM   Clinical Narrative:    Pt is discharging home with home health services through [place]. Information on the AVS.  Wife also interested in palliative care at home. CM updated MD and he was in agreement. Wife provided choice and [facility] decided on. Information on the AVS.  Wife needs oxygen to transport pt home. CM has updated [employer] and they will deliver a tank to the room.  Wife states she can provide the transport home.   Final next level of care: Home w Home Health Services Barriers to Discharge: No Barriers Identified   Patient Goals and CMS Choice CMS Medicare.gov Compare Post Acute Care list provided to:: Patient Represenative (must comment) Choice offered to / list presented to : Spouse  Discharge Placement                         Discharge Plan and Services Additional resources added to the After Visit Summary for     Discharge Planning Services: CM Consult Post Acute Care Choice: Home Health                    HH Arranged: PT, OT [facility] Agency: [place] Date [place]: 02/12/22   Representative spoke with at [place]: Amy  Social Determinants of Health ([place]) Interventions SDOH Screenings   Food Insecurity: No Food Insecurity (02/11/2022)  Housing: Low Risk  (02/11/2022)  Transportation Needs: No Transportation Needs (02/11/2022)  Utilities: Not At Risk (02/11/2022)  Depression (PHQ2-9): Medium Risk (01/19/2022)  Tobacco Use: Low Risk  (02/11/2022)     Readmission Risk Interventions     No data to display

## 2022-02-14 NOTE — Progress Notes (Signed)
Physical Therapy Treatment Patient Details Name: Roberto Ponce MRN: [id number] DOB: 1950/04/14 Today's Date: 02/14/2022   History of Present Illness Patient is a 71 y/o male who presents on 12/21 with AMS and tachypnea. Intubated 12/21-12/23. Found to have acute respiratory failure and possible PNA. PMH includes CAD/CABG/stenting, mitral valve prolapse, MR, chronic HF, testicular cancer s/p orchiectomy 04/2021, DVT may 2023, chronic hypoxic respiratory failure on 4L, BOOP, interstitial pulmonary fibrosis with progression.    PT Comments    Pt greeted propped in bed and agreeable to session with good progress towards acute goals, however pt continues to be limited by impaired balance, general weakness, fatigue, DOE and increased O2 needs. Pt requiring grossly min guard assist to min assist for all mobility with pt able to progress gait with RW in hall for >100' this session. Pt needing x3 standing rest breaks during ambulation secondary to fatigue and mild DOE, SpO2 95% on 3L during mobility. Educated pt re; importance of continued mobility and activity recommendations with pt verbalizing understanding. Pt continues to benefit from skilled PT services to progress toward functional mobility goals.     Recommendations for follow up therapy are one component of a multi-disciplinary discharge planning process, led by the attending physician.  Recommendations may be updated based on patient status, additional functional criteria and insurance authorization.  Follow Up Recommendations  Home health PT     Assistance Recommended at Discharge Frequent or constant Supervision/Assistance  Patient can return home with the following A lot of help with walking and/or transfers;A little help with bathing/dressing/bathroom;Assist for transportation;Assistance with cooking/housework   Equipment Recommendations  None recommended by PT    Recommendations for Other Services       Precautions /  Restrictions Precautions Precautions: Fall;Other (comment) Precaution Comments: watch 02 Restrictions Weight Bearing Restrictions: No     Mobility  Bed Mobility Overal bed mobility: Needs Assistance Bed Mobility: Supine to Sit, Sit to Supine     Supine to sit: Supervision, HOB elevated Sit to supine: Min assist   General bed mobility comments: no assist needed, posterior bias. Increased time. min assist to return BLEs to bed    Transfers Overall transfer level: Needs assistance Equipment used: Rolling walker (2 wheels) Transfers: Sit to/from Stand, Bed to chair/wheelchair/BSC Sit to Stand: Min assist           General transfer comment: Cues for hand placement and RW management    Ambulation/Gait Ambulation/Gait assistance: Min guard, Min assist Gait Distance (Feet): 120 Feet Assistive device: Rolling walker (2 wheels) Gait Pattern/deviations: Step-through pattern, Decreased stride length, Trunk flexed     Pre-gait activities: standing marching General Gait Details: slow gait with RW, cues for RW proximity and upright posture with pt able to correct but unable to maintain,   Stairs             Wheelchair Mobility    Modified Rankin (Stroke Patients Only)       Balance Overall balance assessment: Needs assistance Sitting-balance support: Feet supported, No upper extremity supported Sitting balance-Leahy Scale: Fair Sitting balance - Comments: posterior bias and lean but able to regain balance to upright with and without cues   Standing balance support: During functional activity, Bilateral upper extremity supported Standing balance-Leahy Scale: Poor Standing balance comment: Requires UE support in standing                            Cognition Arousal/Alertness: Awake/alert Behavior During Therapy:  WFL for tasks assessed/performed, Flat affect Overall Cognitive Status: Impaired/Different from baseline Area of Impairment: Problem  solving                             Problem Solving: Slow processing, Requires verbal cues          Exercises      General Comments General comments (skin integrity, edema, etc.): wife present and supportive throughout, SpO2 >91% on 3L throughout      Pertinent Vitals/Pain Pain Assessment Pain Assessment: No/denies pain    Home Living                          Prior Function            PT Goals (current goals can now be found in the care plan section) Acute Rehab PT Goals PT Goal Formulation: With patient/family Time For Goal Achievement: 02/25/22    Frequency    Min 3X/week      PT Plan      Co-evaluation              AM-PAC PT "6 Clicks" Mobility   Outcome Measure  Help needed turning from your back to your side while in a flat bed without using bedrails?: A Little Help needed moving from lying on your back to sitting on the side of a flat bed without using bedrails?: A Little Help needed moving to and from a bed to a chair (including a wheelchair)?: A Little Help needed standing up from a chair using your arms (e.g., wheelchair or bedside chair)?: A Little Help needed to walk in hospital room?: A Little Help needed climbing 3-5 steps with a railing? : A Lot 6 Click Score: 17    End of Session Equipment Utilized During Treatment: Gait belt;Oxygen Activity Tolerance: Patient tolerated treatment well Patient left: with call bell/phone within reach;in bed;with bed alarm set;with family/visitor present Nurse Communication: Mobility status PT Visit Diagnosis: Muscle weakness (generalized) (M62.81);Difficulty in walking, not elsewhere classified (R26.2);Unsteadiness on feet (R26.81)     Time: 5462-7035 PT Time Calculation (min) (ACUTE ONLY): 23 min  Charges:  $Gait Training: 8-22 mins $Therapeutic Activity: 8-22 mins                     Inessa Wardrop R. PTA Acute Rehabilitation Services Office: [place] 02/14/2022, 11:20 AM

## 2022-02-14 NOTE — Progress Notes (Signed)
0545 - was unable to get a oral or axillary temp.  Rectal temp 95.5 patient was alert at baseline. Body feels cold, no shivery noted, when asked if he felt cold pt reply "a little bit".   Applied warm blankets. Contacted NP Rufina Falco. Ouma orders labs, and states "do not rapidly warm patient"  [phone] - unable to obtain axillary or oral temp, obtained rectal temp 95.6. Updated Ouma to temp and condition. Ouma replies "need tpo slowly rewarm please". Explained that options available are warm blankets, room temp blankets or no blankets, asked to clarify course of action. Ouma replys "just warm blankets" This nurse continues course of action.   0730 Oral temp 97.2

## 2022-02-14 NOTE — Discharge Summary (Signed)
Physician Discharge Summary  Roberto Ponce CVE:[id number] DOB: Jul 08, 1950 DOA: 02/08/2022  PCP: Kristen Loader, FNP  Admit date: 02/08/2022 Discharge date: 02/14/2022  Admitted From: Home Disposition: Home  Recommendations for Outpatient Follow-up:  Follow up with PCP in 1-2 weeks Follow-up with pulmonology in 3 weeks with Dr. Erin Fulling as scheduled  Home Health: Resume PT OT Equipment/Devices: No new equipment  Discharge Condition: Stable CODE STATUS: DNR Diet recommendation: Low-salt low-fat low-carb diet  Brief/Interim Summary: 71 year old Caucasian male with a past medical history of Boop, interstitial pulmonary fibrosis with progression, chronic hypoxic respiratory failure, coronary artery disease who presented to the emergency department with altered mental status after sustaining a recent fall. No acute findings were noted on CT head. However patient had to be intubated for airway protection. He was admitted to the ICU. He was stabilized. Palliative care was consulted. Subsequently extubated. He was changed over to DNR status with no plans for reintubation.   Discharge Diagnoses:  Principal Problem:   Acute on chronic respiratory failure (HCC) Active Problems:   Acute respiratory failure with hypoxia and hypercapnia (HCC)  Acute on chronic respiratory failure with hypoxia/history of Boop/interstitial pulmonary fibrosis Patient was intubated in the ICU.  Extubated on 12/23.  Was on 5 L of oxygen by nasal cannula.  Weaned down to 2 to 3 L. Uses between 2 to 3 L of oxygen at home. Pulmonology to follow-up out patient, continue steroid taper   Community-acquired pneumonia There was concern for pneumonia over and above his chronic lung issues.  He was started on ceftriaxone and azithromycin.  He has completed his 5-day antibiotic course Chest x-ray was done yesterday morning and did not show any appreciable changes compared to previous films.   Hyponatremia Possibly due  to SIADH considering his chronic lung issues.  Sodium levels have improved.  Follow repeat labs with PCP   Lethargy No obvious focal deficits noted on examination.  He is afebrile.  His sodium level is stable.  Continue to monitor for now.   Normocytic anemia No evidence for overt bleeding.,  Follow repeat labs with PCP   Hypomagnesemia Corrected   Coronary artery disease status post CABG Stable.  Echocardiogram showed normal systolic function without any regional wall motion abnormalities.  Continue aspirin beta-blocker.   Goals of care Palliative care following.  Currently DNR.  No plans for reintubation.   Severe protein calorie malnutrition Nutrition Problem: Severe Malnutrition Etiology: chronic illness (CHF, pulmonary fibrosis) Signs/Symptoms: severe fat depletion, severe muscle depletion, percent weight loss (13.9% weight loss in 7 months) Percent weight loss: 13.9 % Interventions: MVI, Tube feeding  Discharge Instructions   Allergies as of 02/14/2022   No Known Allergies      Medication List     STOP taking these medications    furosemide 40 MG tablet Commonly known as: LASIX   spironolactone 25 MG tablet Commonly known as: ALDACTONE       TAKE these medications    acetaminophen 500 MG tablet Commonly known as: TYLENOL Take 1,000 mg by mouth every 6 (six) hours as needed for moderate pain.   aspirin EC 81 MG tablet Take 1 tablet (81 mg total) by mouth daily. Swallow whole. Start taking on: February 15, 2022   brimonidine 0.2 % ophthalmic solution Commonly known as: ALPHAGAN Place 1 drop into the left eye 2 (two) times daily.   carvedilol 3.125 MG tablet Commonly known as: COREG Take 1 tablet (3.125 mg total) by mouth 2 (two) times daily with  a meal.   cyanocobalamin 1000 MCG tablet Take 1 tablet (1,000 mcg total) by mouth daily.   dapagliflozin propanediol 10 MG Tabs tablet Commonly known as: FARXIGA Take 1 tablet (10 mg total) by mouth  daily.   dorzolamide-timolol 2-0.5 % ophthalmic solution Commonly known as: COSOPT Place 1 drop into both eyes 2 (two) times daily.   ezetimibe 10 MG tablet Commonly known as: ZETIA TAKE 1 TABLET(10 MG) BY MOUTH DAILY What changed:  how much to take how to take this when to take this additional instructions   feeding supplement Liqd Take 237 mLs by mouth 2 (two) times daily between meals.   latanoprost 0.005 % ophthalmic solution Commonly known as: XALATAN Place 1 drop into both eyes at bedtime.   magic mouthwash (nystatin, lidocaine, diphenhydrAMINE) suspension Take 5 mLs by mouth 3 (three) times daily as needed for mouth pain.   nitroGLYCERIN 0.4 MG SL tablet Commonly known as: Nitrostat Place 1 tablet (0.4 mg total) under the tongue every 5 (five) minutes as needed for chest pain.   OXYGEN Inhale 3 L into the lungs continuous.   pantoprazole 40 MG tablet Commonly known as: Protonix 40 mg BID x 30 days then once daily after What changed:  how much to take how to take this when to take this additional instructions   predniSONE 20 MG tablet Commonly known as: DELTASONE Take 2 tablets (40 mg total) by mouth daily with breakfast for 7 days, THEN 1.5 tablets (30 mg total) daily with breakfast for 7 days, THEN 1 tablet (20 mg total) daily with breakfast for 7 days, THEN 0.5 tablets (10 mg total) daily with breakfast for 7 days. Start taking on: February 15, 2022   Sodium Fluoride 5000 PPM 1.1 % Pste Generic drug: Sodium Fluoride Apply 1 application. topically in the morning and at bedtime.   triamcinolone cream 0.1 % Commonly known as: KENALOG Apply 1 application topically 2 (two) times daily as needed (eczema).        Follow-up [facility]. Follow up.   Why: [facility]--the home health agency will contact you for the first home visit. Contact information: [place]  [phone]                No Known Allergies  Consultations: Pulmonology  Procedures/Studies: DG CHEST PORT 1 VIEW  Result Date: 02/12/2022 CLINICAL DATA:  In patient. Follow-up exam. Respiratory distress. Pneumonia. EXAM: PORTABLE CHEST 1 VIEW COMPARISON:  02/08/2022 and older exams. FINDINGS: Coarse interstitial and hazy airspace lung opacities persist throughout the left lung, less prominent in the right lung, mostly peripheral, all stable. No new lung abnormalities. Probable small left pleural effusion. No pneumothorax. Endotracheal tube and nasal/orogastric tube have been removed. Left sided PICC, new, has its tip in the lower superior vena cava. IMPRESSION: 1. No change in lung aeration. Persistent interstitial and airspace lung opacities, greatest on the left. 2. Status post extubation.  Well-positioned left-sided PICC. Electronically Signed   By: Lajean Manes M.D.   On: 02/12/2022 10:04   ECHOCARDIOGRAM LIMITED  Result Date: 02/09/2022    ECHOCARDIOGRAM LIMITED REPORT   Patient Name:   Roberto Ponce Date of Exam: 02/09/2022 Medical Rec #:  [id number]           Height:       69.0 in Accession #:    [id number]  Weight:       140.2 lb Date of Birth:  November 09, 1950           BSA:          1.777 m Patient Age:    8 years            BP:           107/60 mmHg Patient Gender: M                   HR:           82 bpm. Exam Location:  Inpatient Procedure: Limited Echo, Limited Color Doppler and Cardiac Doppler Indications:     Congestive Heart Failure I50.9  History:         Patient has prior history of Echocardiogram examinations, most                  recent 07/05/2021. CHF and LVH, CAD and Previous Myocardial                  Infarction, Shock, Mitral Valve Disease; Risk                  Factors:Non-Smoker and Dyslipidemia.  Sonographer:     Greer Pickerel Referring Phys:  [id number] Freddi Starr Diagnosing Phys: [facility]  Sonographer Comments: Echo performed with  patient supine and on artificial respirator. Image acquisition challenging due to respiratory motion. IMPRESSIONS  1. Left ventricular ejection fraction, by estimation, is 60 to 65%. The left ventricle has normal function. The left ventricle has no regional wall motion abnormalities. Left ventricular diastolic parameters are consistent with Grade I diastolic dysfunction (impaired relaxation).  2. Right ventricular systolic function is normal. The right ventricular size is normal.  3. Trivial mitral valve regurgitation.  4. There is mild calcification of the aortic valve. Aortic valve regurgitation is not visualized. Conclusion(s)/Recommendation(s): Compared to prior study 07/05/2021, EF has improved. FINDINGS  Left Ventricle: Left ventricular ejection fraction, by estimation, is 60 to 65%. The left ventricle has normal function. The left ventricle has no regional wall motion abnormalities. Definity contrast agent was given IV to delineate the left ventricular  endocardial borders. There is no left ventricular hypertrophy. Left ventricular diastolic parameters are consistent with Grade I diastolic dysfunction (impaired relaxation). Right Ventricle: The right ventricular size is normal. Right ventricular systolic function is normal. Mitral Valve: Trivial mitral valve regurgitation. Tricuspid Valve: Tricuspid valve regurgitation is trivial. Aortic Valve: There is mild calcification of the aortic valve. Aortic valve regurgitation is not visualized. Additional Comments: Spectral Doppler performed. Color Doppler performed.  LEFT VENTRICLE PLAX 2D LVIDd:         4.90 cm LVIDs:         3.40 cm LV PW:         0.80 cm LV IVS:        0.80 cm LVOT diam:     1.80 cm LV SV:         49 LV SV Index:   28 LVOT Area:     2.54 cm  LEFT ATRIUM         Index LA diam:    4.30 cm 2.42 cm/m  AORTIC VALVE LVOT Vmax:   105.00 cm/s LVOT Vmean:  68.100 cm/s LVOT VTI:    0.193 m  AORTA Ao Root diam: 3.00 cm MITRAL VALVE                 TRICUSPID VALVE  MV Area (PHT): 3.77 cm     TR Peak grad:   29.8 mmHg MV Decel Time: 201 msec     TR Vmax:        273.00 cm/s MR Peak grad: 41.5 mmHg MR Vmax:      322.00 cm/s   SHUNTS MV E velocity: 94.30 cm/s   Systemic VTI:  0.19 m MV A velocity: 116.00 cm/s  Systemic Diam: 1.80 cm MV E/A ratio:  0.81 Mary Scientist, physiological signed by Phineas Inches Signature Date/Time: 02/09/2022/11:37:04 AM    Final (Updated)    DG Abd Portable 1V  Result Date: 02/08/2022 CLINICAL DATA:  Placement of enteric tube EXAM: PORTABLE ABDOMEN - 1 VIEW COMPARISON:  None Available. FINDINGS: Tip of enteric tube is seen in the region of proximal duodenum. Bowel gas pattern in the upper abdomen is unremarkable. Interstitial infiltrates are seen in the lower lung fields, more so on the left side. Surgical clips are seen in right upper quadrant. Metallic sutures are seen in the sternum. IMPRESSION: Tip of enteric tube is seen in the region of proximal duodenum. Electronically Signed   By: Elmer Picker M.D.   On: 02/08/2022 17:33   [place] EKG SITE RITE  Result Date: 02/08/2022 If Site Rite image not attached, placement could not be confirmed due to current cardiac rhythm.  CT Head Wo Contrast  Result Date: 02/08/2022 CLINICAL DATA:  Mental status change EXAM: CT HEAD WITHOUT CONTRAST TECHNIQUE: Contiguous axial images were obtained from the base of the skull through the vertex without intravenous contrast. RADIATION DOSE REDUCTION: This exam was performed according to the departmental dose-optimization program which includes automated exposure control, adjustment of the mA and/or kV according to patient size and/or use of iterative reconstruction technique. COMPARISON:  CT head 02/01/2022 FINDINGS: Brain: Mild atrophy.  Negative for acute infarct, hemorrhage, mass Vascular: Negative for hyperdense vessel Skull: Negative Sinuses/Orbits: Mild mucosal edema paranasal sinuses. Patient is intubated. NG tube in place. No orbital  lesion. Other: None IMPRESSION: Mild atrophy. No acute abnormality. Electronically Signed   By: Franchot Gallo M.D.   On: 02/08/2022 09:36   DG Chest Portable 1 View  Result Date: 02/08/2022 CLINICAL DATA:  Altered mental status EXAM: PORTABLE CHEST 1 VIEW COMPARISON:  09/28/2021 FINDINGS: Endotracheal tube 3.5 cm from carina. NG tube extends into a hiatal hernia above the LEFT hemidiaphragm. There is patchy airspace disease in LEFT lung. RIGHT lung clear. No pneumothorax IMPRESSION: 1. Patchy airspace disease in the LEFT lung representing pneumonia versus asymmetric edema. 2. Endotracheal tube in good position. 3. NG tube in herniated stomach Electronically Signed   By: Suzy Bouchard M.D.   On: 02/08/2022 09:14   CT HEAD WO CONTRAST  Result Date: 02/01/2022 CLINICAL DATA:  Neuro deficit, acute, stroke suspected EXAM: CT HEAD WITHOUT CONTRAST TECHNIQUE: Contiguous axial images were obtained from the base of the skull through the vertex without intravenous contrast. RADIATION DOSE REDUCTION: This exam was performed according to the departmental dose-optimization program which includes automated exposure control, adjustment of the mA and/or kV according to patient size and/or use of iterative reconstruction technique. COMPARISON:  None Available. FINDINGS: Brain: No evidence of acute infarction, hemorrhage, hydrocephalus, extra-axial collection or mass lesion/mass effect. Scattered low-density changes within the periventricular and subcortical white matter compatible with chronic microvascular ischemic change. Mild diffuse cerebral volume loss. Vascular: Atherosclerotic calcifications involving the large vessels of the skull base. No unexpected hyperdense vessel. Skull: Normal. Negative for fracture or focal lesion. Sinuses/Orbits: Chronic appearing  bilateral maxillary sinus disease, more pronounced on the right. Otherwise clear. Other: None. IMPRESSION: 1. No acute intracranial findings. 2. Chronic  microvascular ischemic change and cerebral volume loss. 3. Chronic appearing bilateral maxillary sinus disease. Electronically Signed   By: Davina Poke D.O.   On: 02/01/2022 18:20     Subjective: No acute issues or events overnight, respiratory status back to baseline   Discharge Exam: Vitals:   02/14/22 0730 02/14/22 0924  BP:  (!) 91/50  Pulse:  94  Resp:  15  Temp: (!) 97.2 F (36.2 C) 98 F (36.7 C)  SpO2:  97%   Vitals:   02/14/22 0608 02/14/22 0644 02/14/22 0730 02/14/22 0924  BP: 102/72   (!) 91/50  Pulse:    94  Resp: 18   15  Temp: (!) 95.5 F (35.3 C) (!) 95.6 F (35.3 C) (!) 97.2 F (36.2 C) 98 F (36.7 C)  TempSrc: Rectal Rectal Oral Axillary  SpO2: 99%   97%  Weight:      Height:        General: Pt is alert, awake, not in acute distress Cardiovascular: RRR, S1/S2 +, no rubs, no gallops Respiratory: CTA bilaterally, no wheezing, no rhonchi Abdominal: Soft, NT, ND, bowel sounds + Extremities: no edema, no cyanosis  The results of significant diagnostics from this hospitalization (including imaging, microbiology, ancillary and laboratory) are listed below for reference.     Microbiology: Recent Results (from the past 240 hour(s))  Culture, blood (routine x 2)     Status: None   Collection Time: 02/08/22  8:40 AM   Specimen: BLOOD  Result Value Ref Range Status   Specimen Description BLOOD SITE NOT SPECIFIED  Final   Special Requests   Final    BOTTLES DRAWN AEROBIC AND ANAEROBIC Blood Culture adequate volume   Culture   Final    NO GROWTH 5 DAYS Performed at [facility], [address] [place], [place] [address]    Report Status 02/13/2022 FINAL  Final  Resp panel by RT-PCR (RSV, Flu A&B, Covid)     Status: None   Collection Time: 02/08/22  8:41 AM   Specimen: Nasal Swab  Result Value Ref Range Status   SARS Coronavirus 2 by RT PCR NEGATIVE NEGATIVE Final    Comment: (NOTE) SARS-CoV-2 target nucleic acids are NOT DETECTED.  The  SARS-CoV-2 RNA is generally detectable in upper respiratory specimens during the acute phase of infection. The lowest concentration of SARS-CoV-2 viral copies this assay can detect is 138 copies/mL. A negative result does not preclude SARS-Cov-2 infection and should not be used as the sole basis for treatment or other patient management decisions. A negative result may occur with  improper specimen collection/handling, submission of specimen other than nasopharyngeal swab, presence of viral mutation(s) within the areas targeted by this assay, and inadequate number of viral copies(<138 copies/mL). A negative result must be combined with clinical observations, patient history, and epidemiological information. The expected result is Negative.  Fact Sheet for Patients:  EntrepreneurPulse.com.au  Fact Sheet for Healthcare Providers:  IncredibleEmployment.be  This test is no t yet approved or cleared by the [place] FDA and  has been authorized for detection and/or diagnosis of SARS-CoV-2 by FDA under an Emergency Use Authorization (EUA). This EUA will remain  in effect (meaning this test can be used) for the duration of the COVID-19 declaration under Section 564(b)(1) of the Act, 21 U.S.C.section 360bbb-3(b)(1), unless the authorization is terminated  or revoked  sooner.       Influenza A by PCR NEGATIVE NEGATIVE Final   Influenza B by PCR NEGATIVE NEGATIVE Final    Comment: (NOTE) The Xpert Xpress SARS-CoV-2/FLU/RSV plus assay is intended as an aid in the diagnosis of influenza from Nasopharyngeal swab specimens and should not be used as a sole basis for treatment. Nasal washings and aspirates are unacceptable for Xpert Xpress SARS-CoV-2/FLU/RSV testing.  Fact Sheet for Patients: EntrepreneurPulse.com.au  Fact Sheet for Healthcare Providers: IncredibleEmployment.be  This test is not yet approved or  cleared by the [place] FDA and has been authorized for detection and/or diagnosis of SARS-CoV-2 by FDA under an Emergency Use Authorization (EUA). This EUA will remain in effect (meaning this test can be used) for the duration of the COVID-19 declaration under Section 564(b)(1) of the Act, 21 U.S.C. section 360bbb-3(b)(1), unless the authorization is terminated or revoked.     Resp Syncytial Virus by PCR NEGATIVE NEGATIVE Final    Comment: (NOTE) Fact Sheet for Patients: EntrepreneurPulse.com.au  Fact Sheet for Healthcare Providers: IncredibleEmployment.be  This test is not yet approved or cleared by the [place] FDA and has been authorized for detection and/or diagnosis of SARS-CoV-2 by FDA under an Emergency Use Authorization (EUA). This EUA will remain in effect (meaning this test can be used) for the duration of the COVID-19 declaration under Section 564(b)(1) of the Act, 21 U.S.C. section 360bbb-3(b)(1), unless the authorization is terminated or revoked.  Performed at [facility], [place] [address], [place], [place] [address]   Culture, Respiratory w Gram Stain (tracheal aspirate)     Status: None   Collection Time: 02/08/22 11:11 AM   Specimen: Tracheal Aspirate; Respiratory  Result Value Ref Range Status   Specimen Description TRACHEAL ASPIRATE  Final   Special Requests NONE  Final   Gram Stain   Final    MODERATE WBC PRESENT,BOTH PMN AND MONONUCLEAR FEW GRAM POSITIVE COCCI IN PAIRS IN CLUSTERS    Culture   Final    FEW Normal respiratory flora-no Staph aureus or Pseudomonas seen Performed at [facility], [address], [place], [place] [address]    Report Status 02/11/2022 FINAL  Final  MRSA Next Gen by PCR, Nasal     Status: None   Collection Time: 02/08/22  3:15 PM   Specimen: Nasal Mucosa; Nasal Swab  Result Value Ref Range Status   MRSA by PCR Next Gen NOT DETECTED NOT DETECTED Final    Comment:  (NOTE) The GeneXpert MRSA Assay (FDA approved for NASAL specimens only), is one component of a comprehensive MRSA colonization surveillance program. It is not intended to diagnose MRSA infection nor to guide or monitor treatment for MRSA infections. Test performance is not FDA approved in patients less than 63 years old. Performed at [facility], [place] [address], [place], [place] [address]   Culture, blood (Routine X 2) w Reflex to ID Panel     Status: None   Collection Time: 02/08/22  5:05 PM   Specimen: BLOOD RIGHT HAND  Result Value Ref Range Status   Specimen Description BLOOD RIGHT HAND  Final   Special Requests   Final    BOTTLES DRAWN AEROBIC AND ANAEROBIC Blood Culture results may not be optimal due to an inadequate volume of blood received in culture bottles   Culture   Final    NO GROWTH 5 DAYS Performed at [facility], [place] [address] [place],  [address]  Report Status 02/13/2022 FINAL  Final  Culture, blood (Routine X 2) w Reflex to ID Panel     Status: None   Collection Time: 02/08/22  5:07 PM   Specimen: BLOOD RIGHT HAND  Result Value Ref Range Status   Specimen Description BLOOD RIGHT HAND  Final   Special Requests   Final    BOTTLES DRAWN AEROBIC AND ANAEROBIC Blood Culture results may not be optimal due to an inadequate volume of blood received in culture bottles   Culture   Final    NO GROWTH 5 DAYS Performed at [facility], [place] [address] [place], [place] [address]    Report Status 02/13/2022 FINAL  Final  Urine Culture     Status: None   Collection Time: 02/08/22  5:38 PM   Specimen: Urine, Clean Catch  Result Value Ref Range Status   Specimen Description URINE, CLEAN CATCH  Final   Special Requests NONE  Final   Culture   Final    NO GROWTH Performed at [facility], [place] [address] [place],  [address]    Report Status 02/10/2022 FINAL  Final     Labs: BNP (last 3 results) Recent Labs    06/27/21 0436  07/04/21 2210 02/08/22 0844  BNP 130.6* 89.9 277.8*   Basic Metabolic Panel: Recent Labs  Lab 02/09/22 0251 02/09/22 1639 02/10/22 0250 02/10/22 1700 02/11/22 0315 02/11/22 1700 02/12/22 0305 02/12/22 1822 02/14/22 0125  NA 122* 130* 130*  --  132*  --   --  132* 134*  K 4.1 4.1 4.0  --  4.4  --   --  4.3 4.0  CL 84* 91* 94*  --  89*  --   --  84* 87*  CO2 30 32 32  --  36*  --   --  43* 44*  GLUCOSE 258* 127* 159*  --  105*  --   --  124* 93  BUN 6* 9 14  --  8  --   --  15 16  CREATININE 0.47* 0.42* 0.37*  --  0.39*  --   --  0.46* 0.37*  CALCIUM 8.0* 8.0* 8.2*  --  9.0  --   --  8.9 8.6*  MG 1.5* 2.1 1.9 1.6* 1.8 1.9 1.9  --   --   PHOS 3.1 4.3 3.1 2.8 4.1  --   --   --   --    Liver Function Tests: Recent Labs  Lab 02/08/22 0844  AST 25  ALT 18  ALKPHOS 71  BILITOT 0.5  PROT 7.1  ALBUMIN 3.4*   No results for input(s): "LIPASE", "AMYLASE" in the last 168 hours. Recent Labs  Lab 02/08/22 1437  AMMONIA 36*   CBC: Recent Labs  Lab 02/08/22 0844 02/08/22 0914 02/08/22 1437 02/09/22 0226 02/09/22 0251 02/11/22 0315 02/14/22 0125  WBC 6.5  --  8.0  --  9.3 8.8 11.7*  NEUTROABS 4.2  --   --   --   --   --   --   HGB 13.4   < > 10.6* 11.6* 10.7* 10.5* 11.1*  HCT 41.3   < > 31.8* 34.0* 32.2* 33.7* 37.3*  MCV 81.9  --  80.1  --  78.9* 84.9 88.2  PLT 201  --  158  --  182 146* 151   < > = values in this interval not displayed.   Cardiac Enzymes: No results for input(s): "CKTOTAL", "CKMB", "CKMBINDEX", "TROPONINI"  in the last 168 hours. BNP: Invalid input(s): "POCBNP" CBG: Recent Labs  Lab 02/11/22 0752 02/11/22 1134 02/11/22 1630 02/11/22 2102 02/12/22 0003  GLUCAP 96 123* 105* 121* 98   D-Dimer No results for input(s): "DDIMER" in the last 72 hours. Hgb A1c No results for input(s): "HGBA1C" in the last 72 hours. Lipid Profile No results for input(s): "CHOL", "HDL", "LDLCALC", "TRIG", "CHOLHDL", "LDLDIRECT" in the last 72 hours. Thyroid  function studies Recent Labs    02/14/22 0600  TSH 2.173   Anemia work up No results for input(s): "VITAMINB12", "FOLATE", "FERRITIN", "TIBC", "IRON", "RETICCTPCT" in the last 72 hours. Urinalysis    Component Value Date/Time   COLORURINE YELLOW 02/08/2022 [place] 02/08/2022 1738   LABSPEC 1.008 02/08/2022 1738   PHURINE 7.0 02/08/2022 1738   GLUCOSEU >=500 (A) 02/08/2022 1738   HGBUR NEGATIVE 02/08/2022 1738   BILIRUBINUR NEGATIVE 02/08/2022 1738   KETONESUR NEGATIVE 02/08/2022 1738   PROTEINUR NEGATIVE 02/08/2022 1738   NITRITE NEGATIVE 02/08/2022 1738   LEUKOCYTESUR NEGATIVE 02/08/2022 1738   Sepsis Labs Recent Labs  Lab 02/08/22 1437 02/09/22 0251 02/11/22 0315 02/14/22 0125  WBC 8.0 9.3 8.8 11.7*   Microbiology Recent Results (from the past 240 hour(s))  Culture, blood (routine x 2)     Status: None   Collection Time: 02/08/22  8:40 AM   Specimen: BLOOD  Result Value Ref Range Status   Specimen Description BLOOD SITE NOT SPECIFIED  Final   Special Requests   Final    BOTTLES DRAWN AEROBIC AND ANAEROBIC Blood Culture adequate volume   Culture   Final    NO GROWTH 5 DAYS Performed at [facility], [address] [place], [place] [address]    Report Status 02/13/2022 FINAL  Final  Resp panel by RT-PCR (RSV, Flu A&B, Covid)     Status: None   Collection Time: 02/08/22  8:41 AM   Specimen: Nasal Swab  Result Value Ref Range Status   SARS Coronavirus 2 by RT PCR NEGATIVE NEGATIVE Final    Comment: (NOTE) SARS-CoV-2 target nucleic acids are NOT DETECTED.  The SARS-CoV-2 RNA is generally detectable in upper respiratory specimens during the acute phase of infection. The lowest concentration of SARS-CoV-2 viral copies this assay can detect is 138 copies/mL. A negative result does not preclude SARS-Cov-2 infection and should not be used as the sole basis for treatment or other patient management decisions. A negative result may occur with   improper specimen collection/handling, submission of specimen other than nasopharyngeal swab, presence of viral mutation(s) within the areas targeted by this assay, and inadequate number of viral copies(<138 copies/mL). A negative result must be combined with clinical observations, patient history, and epidemiological information. The expected result is Negative.  Fact Sheet for Patients:  EntrepreneurPulse.com.au  Fact Sheet for Healthcare Providers:  IncredibleEmployment.be  This test is no t yet approved or cleared by the [place] FDA and  has been authorized for detection and/or diagnosis of SARS-CoV-2 by FDA under an Emergency Use Authorization (EUA). This EUA will remain  in effect (meaning this test can be used) for the duration of the COVID-19 declaration under Section 564(b)(1) of the Act, 21 U.S.C.section 360bbb-3(b)(1), unless the authorization is terminated  or revoked sooner.       Influenza A by PCR NEGATIVE NEGATIVE Final   Influenza B by PCR NEGATIVE NEGATIVE Final    Comment: (NOTE) The Xpert Xpress SARS-CoV-2/FLU/RSV plus assay is intended as an aid in  the diagnosis of influenza from Nasopharyngeal swab specimens and should not be used as a sole basis for treatment. Nasal washings and aspirates are unacceptable for Xpert Xpress SARS-CoV-2/FLU/RSV testing.  Fact Sheet for Patients: EntrepreneurPulse.com.au  Fact Sheet for Healthcare Providers: IncredibleEmployment.be  This test is not yet approved or cleared by the [place] FDA and has been authorized for detection and/or diagnosis of SARS-CoV-2 by FDA under an Emergency Use Authorization (EUA). This EUA will remain in effect (meaning this test can be used) for the duration of the COVID-19 declaration under Section 564(b)(1) of the Act, 21 U.S.C. section 360bbb-3(b)(1), unless the authorization is terminated or revoked.      Resp Syncytial Virus by PCR NEGATIVE NEGATIVE Final    Comment: (NOTE) Fact Sheet for Patients: EntrepreneurPulse.com.au  Fact Sheet for Healthcare Providers: IncredibleEmployment.be  This test is not yet approved or cleared by the [place] FDA and has been authorized for detection and/or diagnosis of SARS-CoV-2 by FDA under an Emergency Use Authorization (EUA). This EUA will remain in effect (meaning this test can be used) for the duration of the COVID-19 declaration under Section 564(b)(1) of the Act, 21 U.S.C. section 360bbb-3(b)(1), unless the authorization is terminated or revoked.  Performed at [facility], [place] [address], [place], [place] [address]   Culture, Respiratory w Gram Stain (tracheal aspirate)     Status: None   Collection Time: 02/08/22 11:11 AM   Specimen: Tracheal Aspirate; Respiratory  Result Value Ref Range Status   Specimen Description TRACHEAL ASPIRATE  Final   Special Requests NONE  Final   Gram Stain   Final    MODERATE WBC PRESENT,BOTH PMN AND MONONUCLEAR FEW GRAM POSITIVE COCCI IN PAIRS IN CLUSTERS    Culture   Final    FEW Normal respiratory flora-no Staph aureus or Pseudomonas seen Performed at [facility], [address] [place], [place] [address]    Report Status 02/11/2022 FINAL  Final  MRSA Next Gen by PCR, Nasal     Status: None   Collection Time: 02/08/22  3:15 PM   Specimen: Nasal Mucosa; Nasal Swab  Result Value Ref Range Status   MRSA by PCR Next Gen NOT DETECTED NOT DETECTED Final    Comment: (NOTE) The GeneXpert MRSA Assay (FDA approved for NASAL specimens only), is one component of a comprehensive MRSA colonization surveillance program. It is not intended to diagnose MRSA infection nor to guide or monitor treatment for MRSA infections. Test performance is not FDA approved in patients less than 57 years old. Performed at [facility], [place] [address] [place],  [place] [address]   Culture, blood (Routine X 2) w Reflex to ID Panel     Status: None   Collection Time: 02/08/22  5:05 PM   Specimen: BLOOD RIGHT HAND  Result Value Ref Range Status   Specimen Description BLOOD RIGHT HAND  Final   Special Requests   Final    BOTTLES DRAWN AEROBIC AND ANAEROBIC Blood Culture results may not be optimal due to an inadequate volume of blood received in culture bottles   Culture   Final    NO GROWTH 5 DAYS Performed at [facility], [place] [address], [place], [place] [address]    Report Status 02/13/2022 FINAL  Final  Culture, blood (Routine X 2) w Reflex to ID Panel     Status: None   Collection Time: 02/08/22  5:07 PM   Specimen: BLOOD RIGHT HAND  Result  Value Ref Range Status   Specimen Description BLOOD RIGHT HAND  Final   Special Requests   Final    BOTTLES DRAWN AEROBIC AND ANAEROBIC Blood Culture results may not be optimal due to an inadequate volume of blood received in culture bottles   Culture   Final    NO GROWTH 5 DAYS Performed at [facility], [place] [address] [place], [place] [address]    Report Status 02/13/2022 FINAL  Final  Urine Culture     Status: None   Collection Time: 02/08/22  5:38 PM   Specimen: Urine, Clean Catch  Result Value Ref Range Status   Specimen Description URINE, CLEAN CATCH  Final   Special Requests NONE  Final   Culture   Final    NO GROWTH Performed at [facility], [place] [address] [place], [place] [address]    Report Status 02/10/2022 FINAL  Final     Time coordinating discharge: Over 30 minutes  SIGNED:   Little Ishikawa, DO [facility] 02/14/2022, 1:22 PM Pager   If 7PM-7AM, please contact night-coverage [email or web address]

## 2022-02-14 NOTE — Progress Notes (Signed)
     Referral previously received for Roberto Ponce for goals of care discussion. Noted most recent palliative in-person assessment dated 02/12/2022 at which time it was recommended to follow from a distance/chart check.   Chart reviewed for Recent provider notes, nurse notes, TOC notes, vitals, labs, and imaging and updates received from RN.   At this time patient appears I asked the patient's family a question that I do not think stable at this point.  Plan is for discharge home with home health and palliative care involvement.  Attempting to maximize quality of life.. No plan for in person follow-up at this time, anticipate discharge in next 24 to 48 hours. Please contact the palliative medicine provider on service for any new/urgent needs that require our assistance with this patient.  Thank you for your referral and allowing [facility] to assist in Roberto Ponce care.   Walden Field, NP Palliative Medicine Team Phone: [phone]  NO CHARGE

## 2022-02-15 ENCOUNTER — Telehealth: Payer: Self-pay | Admitting: Internal Medicine

## 2022-02-15 ENCOUNTER — Encounter (HOSPITAL_COMMUNITY): Payer: PPO

## 2022-02-15 NOTE — Telephone Encounter (Signed)
Called pt spouse in regards to spironolactone.  Reports pt was taken off medication while inpatient and was instructed not to take med at discharge.  Wants to know when/if pt should restart medication.   Has not noticed leg swelling, reports pt did have some hand swelling while in the hospital but has since gone down.   Notes UOP has increased.  Does not have recent BP reading last taken while inpatient.  Advised to monitor BP and weight daily.   Will send to MD to address.  Advised pt MD is not in the office this week if pt condition changes before reply from MD to call back.  Expresses understanding.

## 2022-02-15 NOTE — Telephone Encounter (Signed)
Pt c/o medication issue:  1. Name of Medication: spironolactone   2. How are you currently taking this medication (dosage and times per day)? Stopped taking while in hosp.   3. Are you having a reaction (difficulty breathing--STAT)? No  4. What is your medication issue? Pt's wife is requesting call back to discuss if patient should continue staying off of this medication. He was taken off medication in the hospital.

## 2022-02-16 DIAGNOSIS — R55 Syncope and collapse: Secondary | ICD-10-CM | POA: Diagnosis not present

## 2022-02-16 DIAGNOSIS — I469 Cardiac arrest, cause unspecified: Secondary | ICD-10-CM | POA: Diagnosis not present

## 2022-02-19 DEATH — deceased

## 2022-02-20 ENCOUNTER — Encounter (HOSPITAL_COMMUNITY): Payer: PPO

## 2022-02-21 ENCOUNTER — Telehealth: Payer: Self-pay | Admitting: Internal Medicine

## 2022-02-21 LAB — MISC LABCORP TEST (SEND OUT): Labcorp test code: 9985

## 2022-02-21 NOTE — Telephone Encounter (Signed)
Called spouse to inform of MD response.  She reports pt passed away on 2022/06/03 morning.  Would like Dr. Gasper Sells to know how much they appreciate the care he provided.  Will send to MD as an FYI.

## 2022-02-21 NOTE — Telephone Encounter (Signed)
Received work of Roberto Ponce, art passing.  Discussed with his wife to offer our support and condolence.   Roberto Ponce- I know you knew Mr. Bolender for a long time and wanted to let you know as well.  Roberto Ponce, I know you aren't directly part of his team, but you once help [place] send a condolence card for a different patient, didn't know if we still had them but I think it would be a nice gesture if we are still able to do that.  If not, no worries.  Thanks, MAC

## 2022-02-22 ENCOUNTER — Encounter (HOSPITAL_COMMUNITY): Payer: PPO

## 2022-02-23 NOTE — Progress Notes (Signed)
Discharge Progress Report  Patient Details  Name: Roberto Ponce MRN: [id number] Date of Birth: [birth year] Referring Provider:   [name] Pul[facility] Re[facility] Walk Test from 01/19/2022 in [facility] [place]  Referring Provider [name]        Number of Visits: 3  Reason for Discharge:  Early Exit:  Patient passed away  Smoking History:  Social History   Tobacco Use  Smoking Status Never  Smokeless Tobacco Never    Diagnosis:  Pulmonary fibrosis ([place])  ADL [facility]:  Pulmonary Assessment Scores     Row Name 01/19/22 1144         ADL [facility]SD   ADL Phase Entry     SOB Score total 93       CAT Score   CAT Score 21       mMRC Score   mMRC Score 3              Initial Exercise Prescription:  Initial Exercise Prescription - 01/19/22 1400       Date of Initial Exercise RX and Referring Provider   Date 01/19/22    Referring Provider [name]    Expected Discharge Date 03/29/22      Oxygen   Oxygen Continuous    Liters 2    Maintain Oxygen Saturation 88% or higher      NuStep   Level 1    SPM 50    Minutes 15      Prescription Details   Frequency (times per week) 2    Duration Progress to 30 minutes of continuous aerobic without signs/symptoms of physical distress      Intensity   THRR 40-80% of Max Heartrate 60-120    Ratings of Perceived Exertion 11-13    Perceived Dyspnea 0-4      Progression   Progression Continue progressive overload as per policy without signs/symptoms or physical distress.      Resistance Training   Training Prescription Yes    Weight yellow bands    Reps 10-15             Discharge Exercise Prescription (Final Exercise Prescription Changes):  Exercise Prescription Changes - 02/06/22 1200       Response to Exercise   Blood Pressure (Admit) 98/54    Blood Pressure (Exit) 102/60    Heart Rate (Admit) 80 bpm    Heart Rate (Exercise) 85 bpm    Heart Rate (Exit)  71 bpm    Oxygen Saturation (Admit) 95 %    Oxygen Saturation (Exercise) 96 %    Oxygen Saturation (Exit) 98 %    Rating of Perceived Exertion (Exercise) 13    Perceived Dyspnea (Exercise) 1    Duration Progress to 30 minutes of  aerobic without signs/symptoms of physical distress    Intensity THRR unchanged      Progression   Progression Continue to progress workloads to maintain intensity without signs/symptoms of physical distress.      Resistance Training   Training Prescription Yes    Weight yellow bands    Reps 10-15    Time 10 Minutes      Oxygen   Oxygen Continuous    Liters 2      NuStep   Level 1    SPM 50    Minutes 15    METs 1.4      Oxygen   Maintain Oxygen Saturation 88% or higher  Functional Capacity:  6 Minute Walk     Row Name 01/19/22 1456         6 Minute Walk   Phase Initial     Distance 545 feet     Walk Time 6 minutes     # of Rest Breaks 1  2:38-3:20     MPH 1.03     METS 2.19     RPE 13     Perceived Dyspnea  2     VO2 Peak 7.67     Symptoms No     Resting HR 83 bpm     Resting BP 116/60     Resting Oxygen Saturation  100 %     Exercise Oxygen Saturation  during 6 min walk 90 %     Max Ex. HR 104 bpm     Max Ex. BP 138/74     2 Minute Post BP 128/60       Interval HR   1 Minute HR 103     2 Minute HR 103     3 Minute HR 98     4 Minute HR 99     5 Minute HR 104     6 Minute HR 91     2 Minute Post HR 90     Interval Heart Rate? Yes       Interval Oxygen   Interval Oxygen? Yes     Baseline Oxygen Saturation % 95 %     1 Minute Oxygen Saturation % 92 %     1 Minute Liters of Oxygen 2 L     2 Minute Oxygen Saturation % 92 %     2 Minute Liters of Oxygen 2 L     3 Minute Oxygen Saturation % 93 %     3 Minute Liters of Oxygen 2 L     4 Minute Oxygen Saturation % 93 %     4 Minute Liters of Oxygen 2 L     5 Minute Oxygen Saturation % 92 %     5 Minute Liters of Oxygen 2 L     6 Minute Oxygen Saturation  % 90 %     6 Minute Liters of Oxygen 2 L     2 Minute Post Oxygen Saturation % 98 %     2 Minute Post Liters of Oxygen 2 L              Psychological, QOL, Others - Outcomes: PHQ 2/9:    01/19/2022   11:21 AM 08/31/2016    2:44 PM  Depression screen PHQ 2/9  Decreased Interest 1 0  Down, Depressed, Hopeless 1 0  PHQ - 2 Score 2 0  Altered sleeping 0   Tired, decreased energy 0   Change in appetite 2   Feeling bad or failure about yourself  1   Trouble concentrating 0   Moving slowly or fidgety/restless 0   Suicidal thoughts 0   PHQ-9 Score 5   Difficult doing work/chores Somewhat difficult     Quality of Life:   Personal Goals: Goals established at orientation with interventions provided to work toward goal.  Personal Goals and Risk Factors at Admission - 01/19/22 1138       Core Components/Risk Factors/Patient Goals on Admission    Weight Management Yes;Weight Gain    Intervention Weight Management: Develop a combined nutrition and exercise program designed to reach desired caloric intake, while maintaining appropriate intake of nutrient and  fiber, sodium and fats, and appropriate energy expenditure required for the weight goal.;Weight Management: Provide education and appropriate resources to help participant work on and attain dietary goals.    Expected Outcomes Short Term: Continue to assess and modify interventions until short term weight is achieved;Long Term: Adherence to nutrition and physical activity/exercise program aimed toward attainment of established weight goal;Weight Gain: Understanding of general recommendations for a high calorie, high protein meal plan that promotes weight gain by distributing calorie intake throughout the day with the consumption for 4-5 meals, snacks, and/or supplements    Improve shortness of breath with ADL's Yes    Intervention Provide education, individualized exercise plan and daily activity instruction to help decrease symptoms  of SOB with activities of daily living.    Expected Outcomes Long Term: Be able to perform more ADLs without symptoms or delay the onset of symptoms;Short Term: Improve cardiorespiratory fitness to achieve a reduction of symptoms when performing ADLs              Personal Goals Discharge:  Goals and Risk Factor Review     Row Name 02/05/22 1510             Core Components/Risk Factors/Patient Goals Review   Personal Goals Review Weight Management/Obesity;Develop more efficient breathing techniques such as purse lipped breathing and diaphragmatic breathing and practicing self-pacing with activity.;Improve shortness of breath with ADL's       Review Jasan as well as his wife [name] have both attended the stress and energy and breathing technique education classes. Garlin enjoys coming to class to work and increase his energy and endurance. His oxygen saturation has been stable when exercising on 2L. Khyrie is also working closely with our dietician on gaining weight. His weight has increased from 59.9kg to 61.kg. Compton has been exercising on the NuStep and has increased his METS from 1.2 to 1.4. He is also doing a good job with the resistance bands.       Expected Outcomes See admission goals                Exercise Goals and Review:  Exercise Goals     Row Name 01/19/22 1500 01/31/22 1210           Exercise Goals   Increase Physical Activity Yes Yes      Intervention Provide advice, education, support and counseling about physical activity/exercise needs.;Develop an individualized exercise prescription for aerobic and resistive training based on initial evaluation findings, risk stratification, comorbidities and participant's personal goals. Provide advice, education, support and counseling about physical activity/exercise needs.;Develop an individualized exercise prescription for aerobic and resistive training based on initial evaluation findings, risk stratification,  comorbidities and participant's personal goals.      Expected Outcomes Short Term: Attend rehab on a regular basis to increase amount of physical activity.;Long Term: Exercising regularly at least 3-5 days a week.;Long Term: Add in home exercise to make exercise part of routine and to increase amount of physical activity. Short Term: Attend rehab on a regular basis to increase amount of physical activity.;Long Term: Exercising regularly at least 3-5 days a week.;Long Term: Add in home exercise to make exercise part of routine and to increase amount of physical activity.      Increase Strength and Stamina Yes Yes      Intervention Provide advice, education, support and counseling about physical activity/exercise needs.;Develop an individualized exercise prescription for aerobic and resistive training based on initial evaluation findings, risk stratification, comorbidities and  participant's personal goals. Provide advice, education, support and counseling about physical activity/exercise needs.;Develop an individualized exercise prescription for aerobic and resistive training based on initial evaluation findings, risk stratification, comorbidities and participant's personal goals.      Expected Outcomes Short Term: Increase workloads from initial exercise prescription for resistance, speed, and METs.;Short Term: Perform resistance training exercises routinely during rehab and add in resistance training at home;Long Term: Improve cardiorespiratory fitness, muscular endurance and strength as measured by increased METs and functional capacity (6MWT) Short Term: Increase workloads from initial exercise prescription for resistance, speed, and METs.;Short Term: Perform resistance training exercises routinely during rehab and add in resistance training at home;Long Term: Improve cardiorespiratory fitness, muscular endurance and strength as measured by increased METs and functional capacity (6MWT)      Able to understand  and use rate of perceived exertion (RPE) scale Yes Yes      Intervention Provide education and explanation on how to use RPE scale Provide education and explanation on how to use RPE scale      Expected Outcomes Short Term: Able to use RPE daily in rehab to express subjective intensity level;Long Term:  Able to use RPE to guide intensity level when exercising independently Short Term: Able to use RPE daily in rehab to express subjective intensity level;Long Term:  Able to use RPE to guide intensity level when exercising independently      Able to understand and use Dyspnea scale Yes Yes      Intervention Provide education and explanation on how to use Dyspnea scale Provide education and explanation on how to use Dyspnea scale      Expected Outcomes Short Term: Able to use Dyspnea scale daily in rehab to express subjective sense of shortness of breath during exertion;Long Term: Able to use Dyspnea scale to guide intensity level when exercising independently Short Term: Able to use Dyspnea scale daily in rehab to express subjective sense of shortness of breath during exertion;Long Term: Able to use Dyspnea scale to guide intensity level when exercising independently      Knowledge and understanding of Target Heart Rate Range (THRR) Yes Yes      Intervention Provide education and explanation of THRR including how the numbers were predicted and where they are located for reference Provide education and explanation of THRR including how the numbers were predicted and where they are located for reference      Expected Outcomes Short Term: Able to state/look up THRR;Long Term: Able to use THRR to govern intensity when exercising independently;Short Term: Able to use daily as guideline for intensity in rehab Short Term: Able to state/look up THRR;Long Term: Able to use THRR to govern intensity when exercising independently;Short Term: Able to use daily as guideline for intensity in rehab      Understanding of  Exercise Prescription Yes Yes      Intervention Provide education, explanation, and written materials on patient's individual exercise prescription Provide education, explanation, and written materials on patient's individual exercise prescription      Expected Outcomes Short Term: Able to explain program exercise prescription;Long Term: Able to explain home exercise prescription to exercise independently Short Term: Able to explain program exercise prescription;Long Term: Able to explain home exercise prescription to exercise independently               Exercise Goals Re-Evaluation:  Exercise Goals Re-Evaluation     [place] Name 01/31/22 1211             Exercise  Goal Re-Evaluation   Exercise Goals Review Increase Physical Activity;Able to understand and use Dyspnea scale;Understanding of Exercise Prescription;Increase Strength and Stamina;Knowledge and understanding of Target Heart Rate Range (THRR);Able to understand and use rate of perceived exertion (RPE) scale       Comments Juandedios has completed 2 exercise sessions. He exercises on the Nustep for 30 min. Jarron averages 1.3 METs at level 1 on the Nustep. He performs the warmup and cooldown standing/seated dependent on his shortness of breath and fatigue. Dason is very deconditioned. He comes to [facility] in a wheelchair and is transitioned to a rollator. I discussed Emeric getting a rollator with his wife. She agreed. It is too soon to note any discernable progressions. Will continue to monitor and progress as able.       Expected Outcomes Through exercise at rehab and home, the patient will decrease shortness of breath with daily activities and feel confident in carrying out an exercise regimen at home.                Nutrition & Weight - Outcomes:  Pre Biometrics - 01/19/22 1102       Pre Biometrics   Grip Strength 16 kg              Nutrition:  Nutrition Therapy & Goals - 01/25/22 1159       Nutrition Therapy   Diet  Heart Healthy diet    Protein (specify units) 87-109g/day (1.2-1.5g/kg ideal body weight)      Personal Nutrition Goals   Nutrition Goal Patient to identify strategies for weight gain of 0.5-2.0# per week    Personal Goal #2 Patient to limit to '1500mg'$  sodium daily.    Comments Daimion has recent diagnosis of protein calorie malnutrition; his typical adult weight is ~164#. He is down 32# (19.5%) since 05/18/2021. He reports early satiety when eating and significantly reduced appetite. He does have history of heart attack at age 20, CAD, CHF; his wife reports following a low fat/low cholesterol diet up until hospital admission and subsequent weight loss in spring 2023. He does drink up to 1 ensure plus daily (350kcals, 16g protein). He is taking 1000 mcg B12. He has hx of  iron deficieny anemia treated with 4 iron infusions with last infusion 12/05/2021. His wife, [name], was present today and remains very supportive.  Calories needs [phone] (30-35kcals/kg IBW), 1788kcals ([place] 1.5, actual body weight).      Intervention Plan   Intervention Prescribe, educate and counsel regarding individualized specific dietary modifications aiming towards targeted core components such as weight, hypertension, lipid management, diabetes, heart failure and other comorbidities.;Nutrition handout(s) given to patient.    Expected Outcomes Short Term Goal: Understand basic principles of dietary content, such as calories, fat, sodium, cholesterol and nutrients.;Short Term Goal: A plan has been developed with personal nutrition goals set during dietitian appointment.;Long Term Goal: Adherence to prescribed nutrition plan.             Nutrition Discharge:  Nutrition Assessments - 01/25/22 1222       Rate Your Plate Scores   Pre Score 72             Education Questionnaire Score:  Knowledge Questionnaire Score - 01/19/22 1145       Knowledge Questionnaire Score   Pre Score 15/18              Goals reviewed with patient; copy given to patient.

## 2022-02-27 ENCOUNTER — Encounter (HOSPITAL_COMMUNITY): Payer: PPO

## 2022-02-28 ENCOUNTER — Ambulatory Visit: Payer: PPO | Admitting: Pulmonary Disease

## 2022-03-01 ENCOUNTER — Encounter (HOSPITAL_COMMUNITY): Payer: PPO

## 2022-03-06 ENCOUNTER — Encounter (HOSPITAL_COMMUNITY): Payer: PPO

## 2022-03-08 ENCOUNTER — Encounter (HOSPITAL_COMMUNITY): Payer: PPO

## 2022-03-08 ENCOUNTER — Other Ambulatory Visit: Payer: Self-pay | Admitting: Cardiology

## 2022-03-08 DIAGNOSIS — I340 Nonrheumatic mitral (valve) insufficiency: Secondary | ICD-10-CM

## 2022-03-13 ENCOUNTER — Encounter (HOSPITAL_COMMUNITY): Payer: PPO

## 2022-03-14 ENCOUNTER — Other Ambulatory Visit (HOSPITAL_COMMUNITY): Payer: Self-pay

## 2022-03-15 ENCOUNTER — Encounter (HOSPITAL_COMMUNITY): Payer: PPO

## 2022-03-20 ENCOUNTER — Encounter (HOSPITAL_COMMUNITY): Payer: PPO

## 2022-03-22 ENCOUNTER — Encounter (HOSPITAL_COMMUNITY): Payer: PPO

## 2022-03-27 ENCOUNTER — Encounter (HOSPITAL_COMMUNITY): Payer: PPO

## 2022-03-29 ENCOUNTER — Encounter (HOSPITAL_COMMUNITY): Payer: PPO

## 2022-04-03 ENCOUNTER — Encounter (HOSPITAL_COMMUNITY): Payer: PPO

## 2022-04-05 ENCOUNTER — Encounter (HOSPITAL_COMMUNITY): Payer: PPO

## 2022-04-10 ENCOUNTER — Encounter (HOSPITAL_COMMUNITY): Payer: PPO

## 2022-04-12 ENCOUNTER — Encounter (HOSPITAL_COMMUNITY): Payer: PPO

## 2022-04-17 ENCOUNTER — Encounter (HOSPITAL_COMMUNITY): Payer: PPO

## 2022-04-19 ENCOUNTER — Encounter (HOSPITAL_COMMUNITY): Payer: PPO

## 2022-04-24 ENCOUNTER — Encounter (HOSPITAL_COMMUNITY): Payer: PPO

## 2022-04-26 ENCOUNTER — Encounter (HOSPITAL_COMMUNITY): Payer: PPO

## 2022-05-16 ENCOUNTER — Telehealth: Payer: Self-pay | Admitting: Pulmonary Disease

## 2022-05-16 NOTE — Telephone Encounter (Signed)
Dr. Erin Fulling just an [place]. See previous encounter

## 2022-05-16 NOTE — Telephone Encounter (Signed)
Updated patients chart. Routing to Dr. Erin Fulling as Juluis Rainier.

## 2022-05-16 NOTE — Telephone Encounter (Signed)
I called PT to set a FU appt. Wife states he passed 12/28. Pls let Dr. Erin Fulling know and I will ask Mel Almond to set PT as Deceased.

## 2023-09-05 IMAGING — DX DG CHEST 1V PORT
1 series · 1 of 1 positions shown · non-contrast
Comparison: 06/26/2021

CLINICAL DATA: Short of breath, hypoxia

EXAM:
PORTABLE CHEST 1 VIEW

[chest ap]
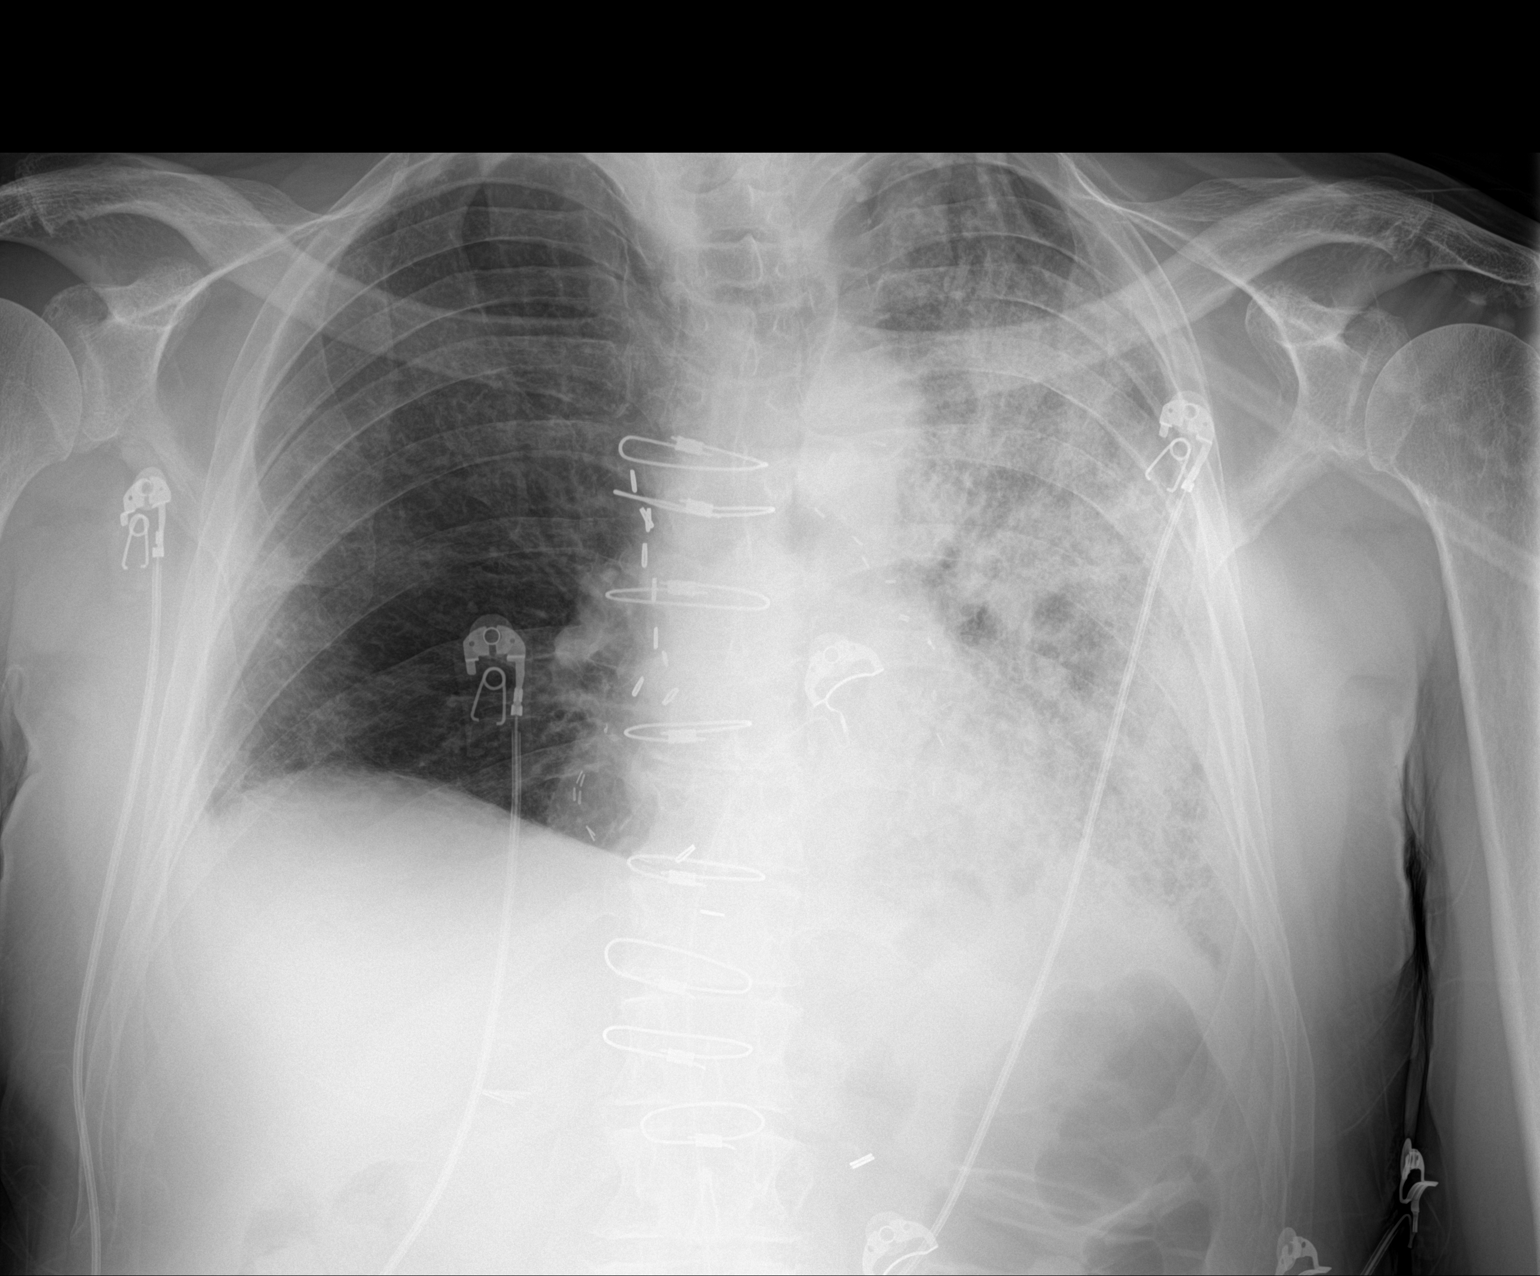

[1 of 1 positions shown; findings below may reference images not displayed]

FINDINGS: Single frontal view of the chest demonstrates stable postsurgical
changes from median sternotomy. Cardiac silhouette is unremarkable.
There is progressive asymmetric bilateral airspace disease, left
greater than right. Small bilateral effusions are suspected. No
pneumothorax. No acute bony abnormalities.
IMPRESSION: 1. Progressive asymmetric bilateral airspace disease, left greater
than right. Findings could reflect infection or asymmetric edema.

## 2023-09-11 IMAGING — CT CT ANGIO CHEST
2 of 7 series · 14 of 46 positions shown · IV contrast (OMNIPAQUE)
Comparison: CT of the chest from June 20, 2021. Chest x-ray of [DATE]
and July 10, 2021.
COMPARISON: CT of the chest from June 20, 2021. Chest x-ray of [DATE]
and July 10, 2021.

Addendum:
CLINICAL DATA: 71-year-old male presents for evaluation of
shortness of breath and suspected pneumomediastinum. Also with known
DVT.

EXAM:
CT ANGIOGRAPHY CHEST WITH CONTRAST
TECHNIQUE: Multidetector CT imaging of the chest was performed using the
standard protocol during bolus administration of intravenous
contrast. Multiplanar CT image reconstructions and MIPs were
obtained to evaluate the vascular anatomy.

[Series 5: thins · axial · 0.79mm/px · z∈[+1482,+1764]mm · 12 of 320 slices shown]
[im 19/320  lung]
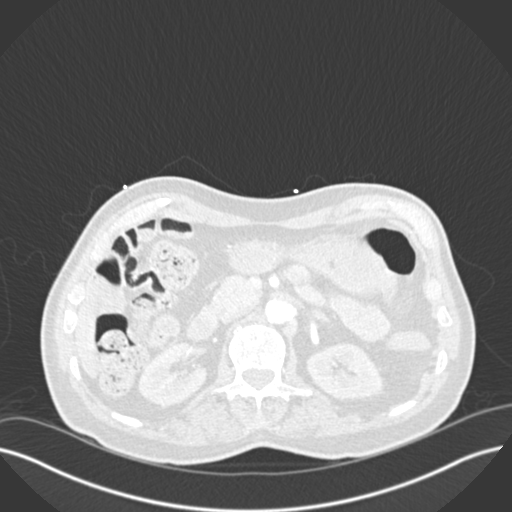
[im 57/320  soft-tissue]
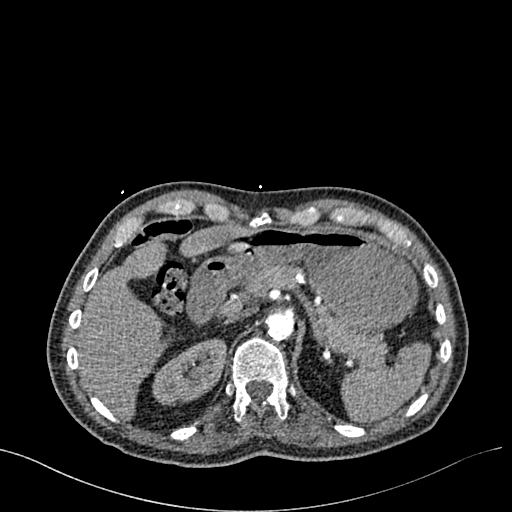
[im 76/320  lung]
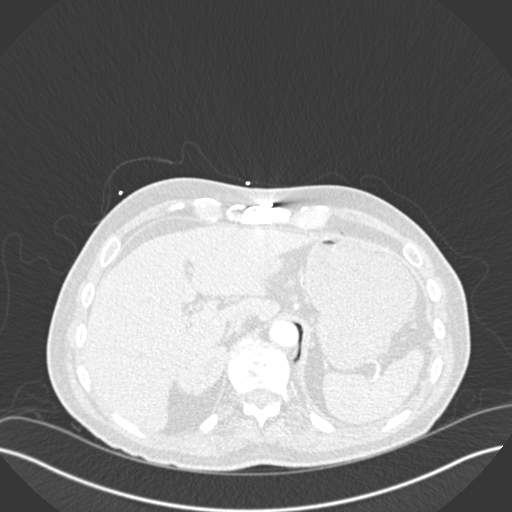
[im 94/320  soft-tissue]
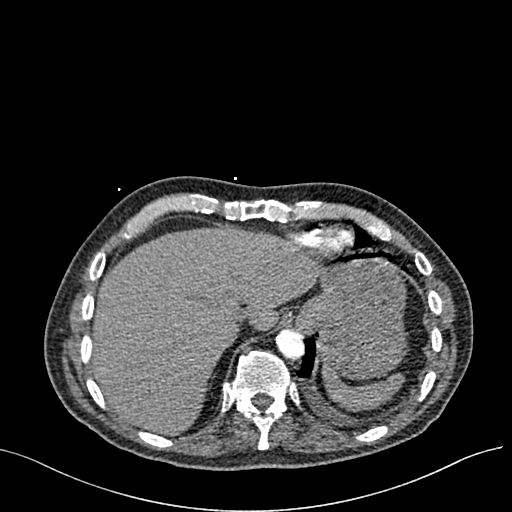
[im 132/320  lung]
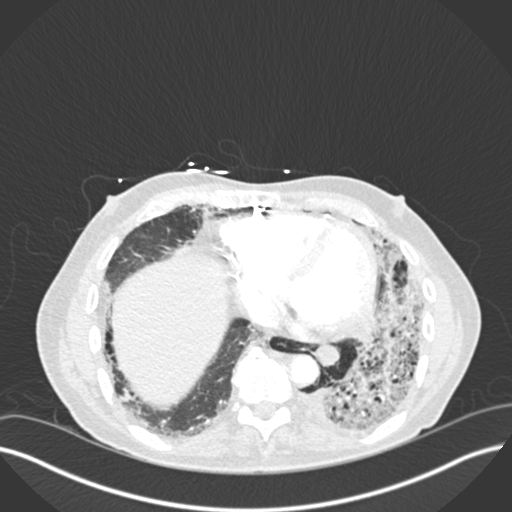
[im 151/320  soft-tissue]
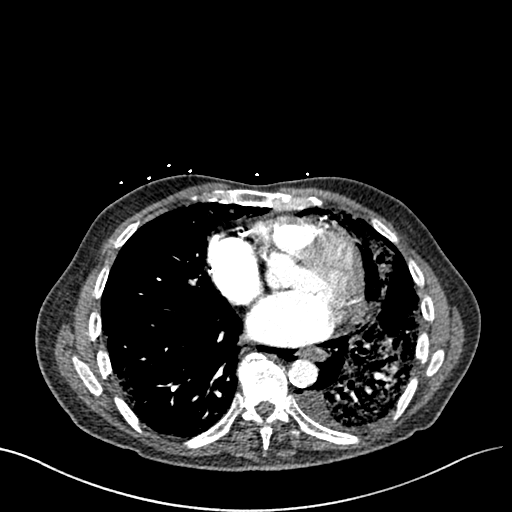
[im 169/320  lung]
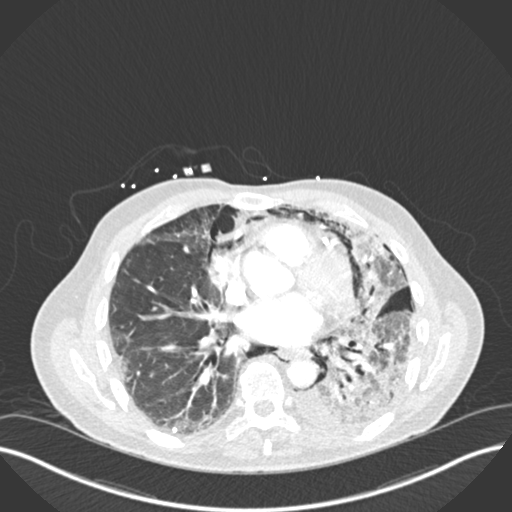
[im 207/320  soft-tissue]
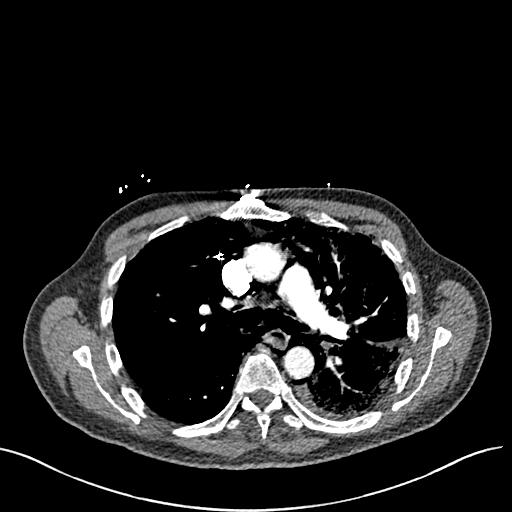
[im 226/320  lung]
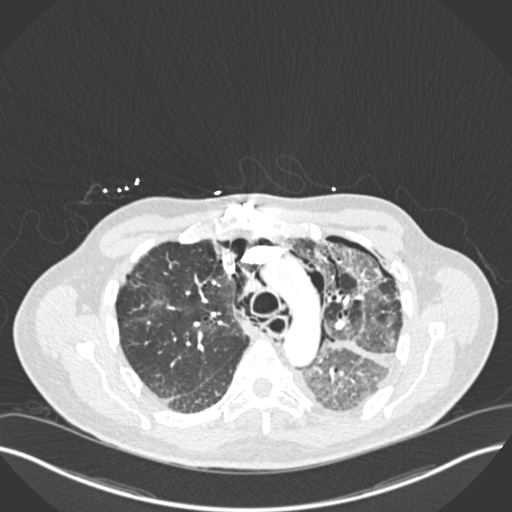
[im 244/320  soft-tissue]
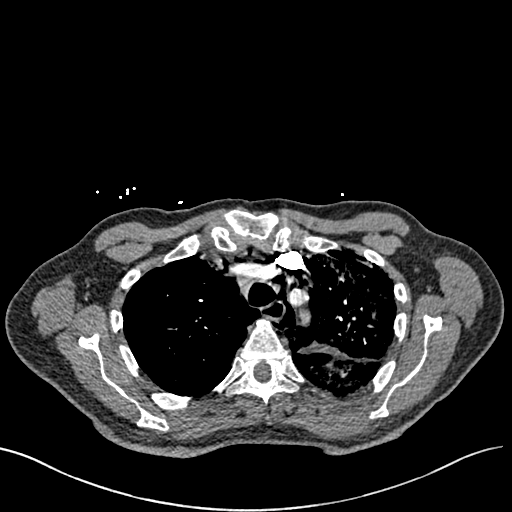
[im 282/320  lung]
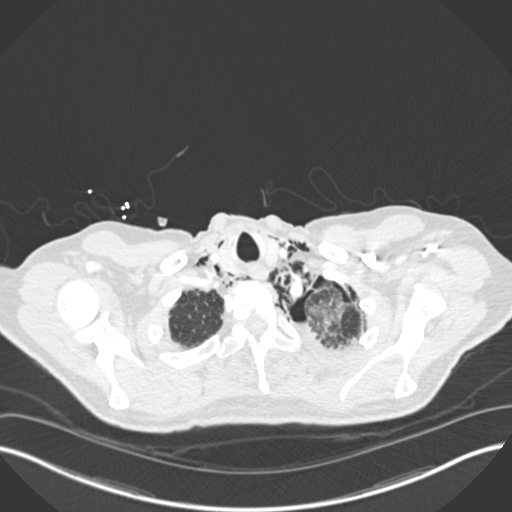
[im 301/320  soft-tissue]
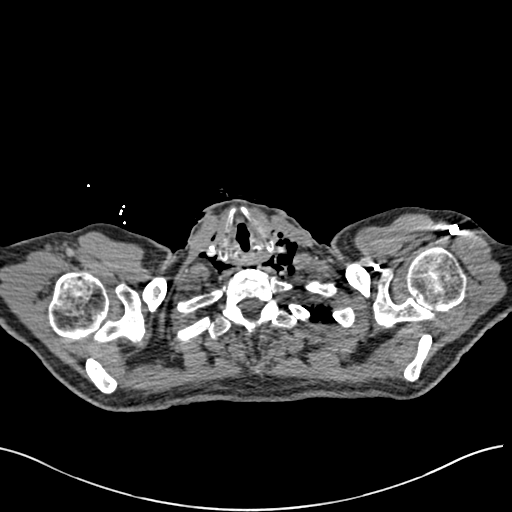

[Series 7: coronal mpr · coronal · 0.71mm/px · 2 of 74 slices shown]
[im 25/74  soft-tissue]
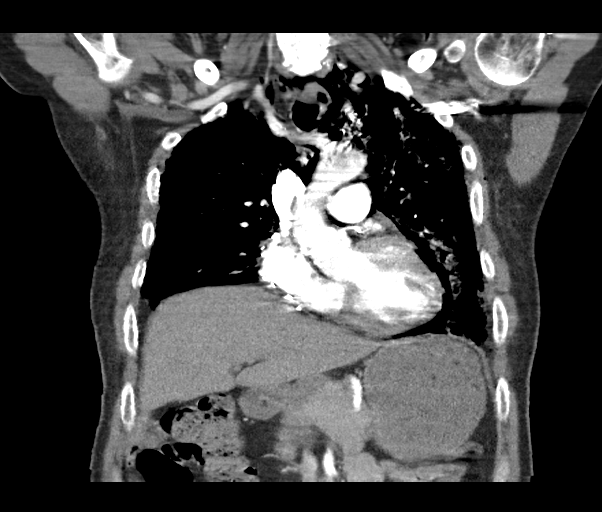
[im 49/74  soft-tissue]
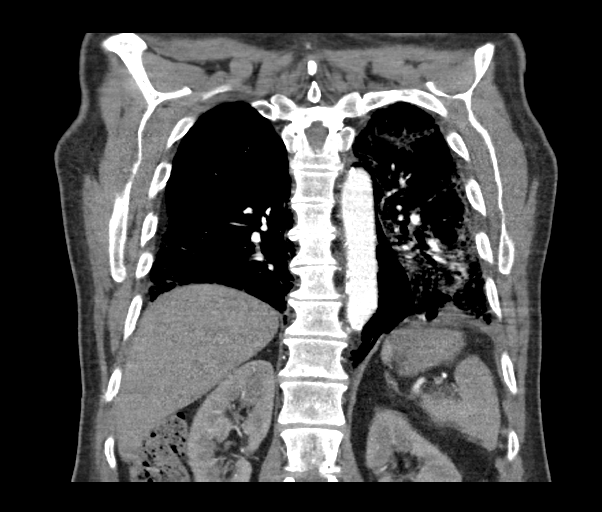

[14 of 46 positions shown; findings below may reference images not displayed]

RADIATION DOSE REDUCTION: This exam was performed according to the
departmental dose-optimization program which includes automated
exposure control, adjustment of the mA and/or kV according to
patient size and/or use of iterative reconstruction technique.

CONTRAST:  65mL OMNIPAQUE IOHEXOL 350 MG/ML SOLN
FINDINGS: Cardiovascular: Calcified and noncalcified aortic atherosclerotic
plaque. No aneurysmal dilation. Normal heart size without
pericardial effusion.

Central pulmonary vasculature is opacified of 410 Hounsfield units,
adequate for assessment and negative for pulmonary embolism to the
segmental level. Subsegmental branches particularly at the lung
bases with limited evaluation due to respiratory motion.

Mediastinum/Nodes: Extensive pneumomediastinum tracking into the low
neck and involving the LEFT greater than RIGHT mediastinal border
and extending along the esophagus and tracking underneath the crus
of the LEFT hemidiaphragm but not extending into the abdomen.

Small associated pneumothorax at the LEFT lung apex and anterior
LEFT chest. No signs of mediastinal shift. No adenopathy in the
chest.

Postoperative changes of prior CABG as before. Median sternotomy
changes.

Lungs/Pleura: Patchy areas of ground-glass and consolidation that
are worse on the LEFT have shown improvement since previous imaging
particularly in the RIGHT chest but also in the LEFT chest.
Pneumothorax appears mildly loculated. There is a small associated
LEFT-sided effusion with resolution of RIGHT-sided pleural fluid.

Upper Abdomen: Pneumomediastinum does not extend into the upper
abdomen but tracks beneath the LEFT diaphragmatic crus. Post
cholecystectomy. Imaged portions of liver, pancreas, spleen, adrenal
glands and kidneys without acute process. No acute gastrointestinal
findings.

Musculoskeletal: Post sternotomy. No acute bone finding. No
destructive bone process. Spinal degenerative changes.

Review of the MIP images confirms the above findings.
IMPRESSION: 1. Negative for pulmonary embolism to the segmental level.
2. Extensive pneumomediastinum tracking into the low neck and
involving the LEFT greater than RIGHT mediastinal border and
extending along the esophagus and underneath the crus of the LEFT
hemidiaphragm but not extending into the abdomen.
3. Etiology for above process is uncertain. Perhaps cough leading to
barotrauma. Esophageal source is another differential consideration.
No mediastinal fluid or focal thickening of the esophagus. This does
not allow for exclusion of esophageal source but is perhaps
reassuring
4. Small associated and partially loculated LEFT-sided pneumothorax
at the apex, anteriorly and with a small loculated component in the
major fissure.
5. Small LEFT-sided pleural effusion slightly diminished with
resolution of RIGHT-sided effusion.
6. Improving airspace disease still with considerable ground-glass
and septal thickening in the LEFT chest and worse at the LEFT lung
base.
7. Aortic atherosclerosis, coronary artery disease and changes of
CABG.

Aortic Atherosclerosis ([id number]-[id number]).

ADDENDUM:
These results were called by telephone at the time of interpretation
on 07/10/2021 at [DATE] to provider Dr. Bagby, Who verbally
acknowledged these results.

*** End of Addendum ***
RADIATION DOSE REDUCTION: This exam was performed according to the
departmental dose-optimization program which includes automated
exposure control, adjustment of the mA and/or kV according to
patient size and/or use of iterative reconstruction technique.

CONTRAST:  65mL OMNIPAQUE IOHEXOL 350 MG/ML SOLN
FINDINGS: Cardiovascular: Calcified and noncalcified aortic atherosclerotic
plaque. No aneurysmal dilation. Normal heart size without
pericardial effusion.

Central pulmonary vasculature is opacified of 410 Hounsfield units,
adequate for assessment and negative for pulmonary embolism to the
segmental level. Subsegmental branches particularly at the lung
bases with limited evaluation due to respiratory motion.

Mediastinum/Nodes: Extensive pneumomediastinum tracking into the low
neck and involving the LEFT greater than RIGHT mediastinal border
and extending along the esophagus and tracking underneath the crus
of the LEFT hemidiaphragm but not extending into the abdomen.

Small associated pneumothorax at the LEFT lung apex and anterior
LEFT chest. No signs of mediastinal shift. No adenopathy in the
chest.

Postoperative changes of prior CABG as before. Median sternotomy
changes.

Lungs/Pleura: Patchy areas of ground-glass and consolidation that
are worse on the LEFT have shown improvement since previous imaging
particularly in the RIGHT chest but also in the LEFT chest.
Pneumothorax appears mildly loculated. There is a small associated
LEFT-sided effusion with resolution of RIGHT-sided pleural fluid.

Upper Abdomen: Pneumomediastinum does not extend into the upper
abdomen but tracks beneath the LEFT diaphragmatic crus. Post
cholecystectomy. Imaged portions of liver, pancreas, spleen, adrenal
glands and kidneys without acute process. No acute gastrointestinal
findings.

Musculoskeletal: Post sternotomy. No acute bone finding. No
destructive bone process. Spinal degenerative changes.

Review of the MIP images confirms the above findings.
IMPRESSION: 1. Negative for pulmonary embolism to the segmental level.
2. Extensive pneumomediastinum tracking into the low neck and
involving the LEFT greater than RIGHT mediastinal border and
extending along the esophagus and underneath the crus of the LEFT
hemidiaphragm but not extending into the abdomen.
3. Etiology for above process is uncertain. Perhaps cough leading to
barotrauma. Esophageal source is another differential consideration.
No mediastinal fluid or focal thickening of the esophagus. This does
not allow for exclusion of esophageal source but is perhaps
reassuring
4. Small associated and partially loculated LEFT-sided pneumothorax
at the apex, anteriorly and with a small loculated component in the
major fissure.
5. Small LEFT-sided pleural effusion slightly diminished with
resolution of RIGHT-sided effusion.
6. Improving airspace disease still with considerable ground-glass
and septal thickening in the LEFT chest and worse at the LEFT lung
base.
7. Aortic atherosclerosis, coronary artery disease and changes of
CABG.

Aortic Atherosclerosis ([id number]-[id number].S).

## 2023-09-12 IMAGING — DX DG CHEST 2V
2 series · 2 of 2 positions shown · non-contrast
Comparison: 07/10/2021.

CLINICAL DATA: Follow-up.  Pneumo mediastinum.

EXAM:
CHEST - 2 VIEW

[chest pa]
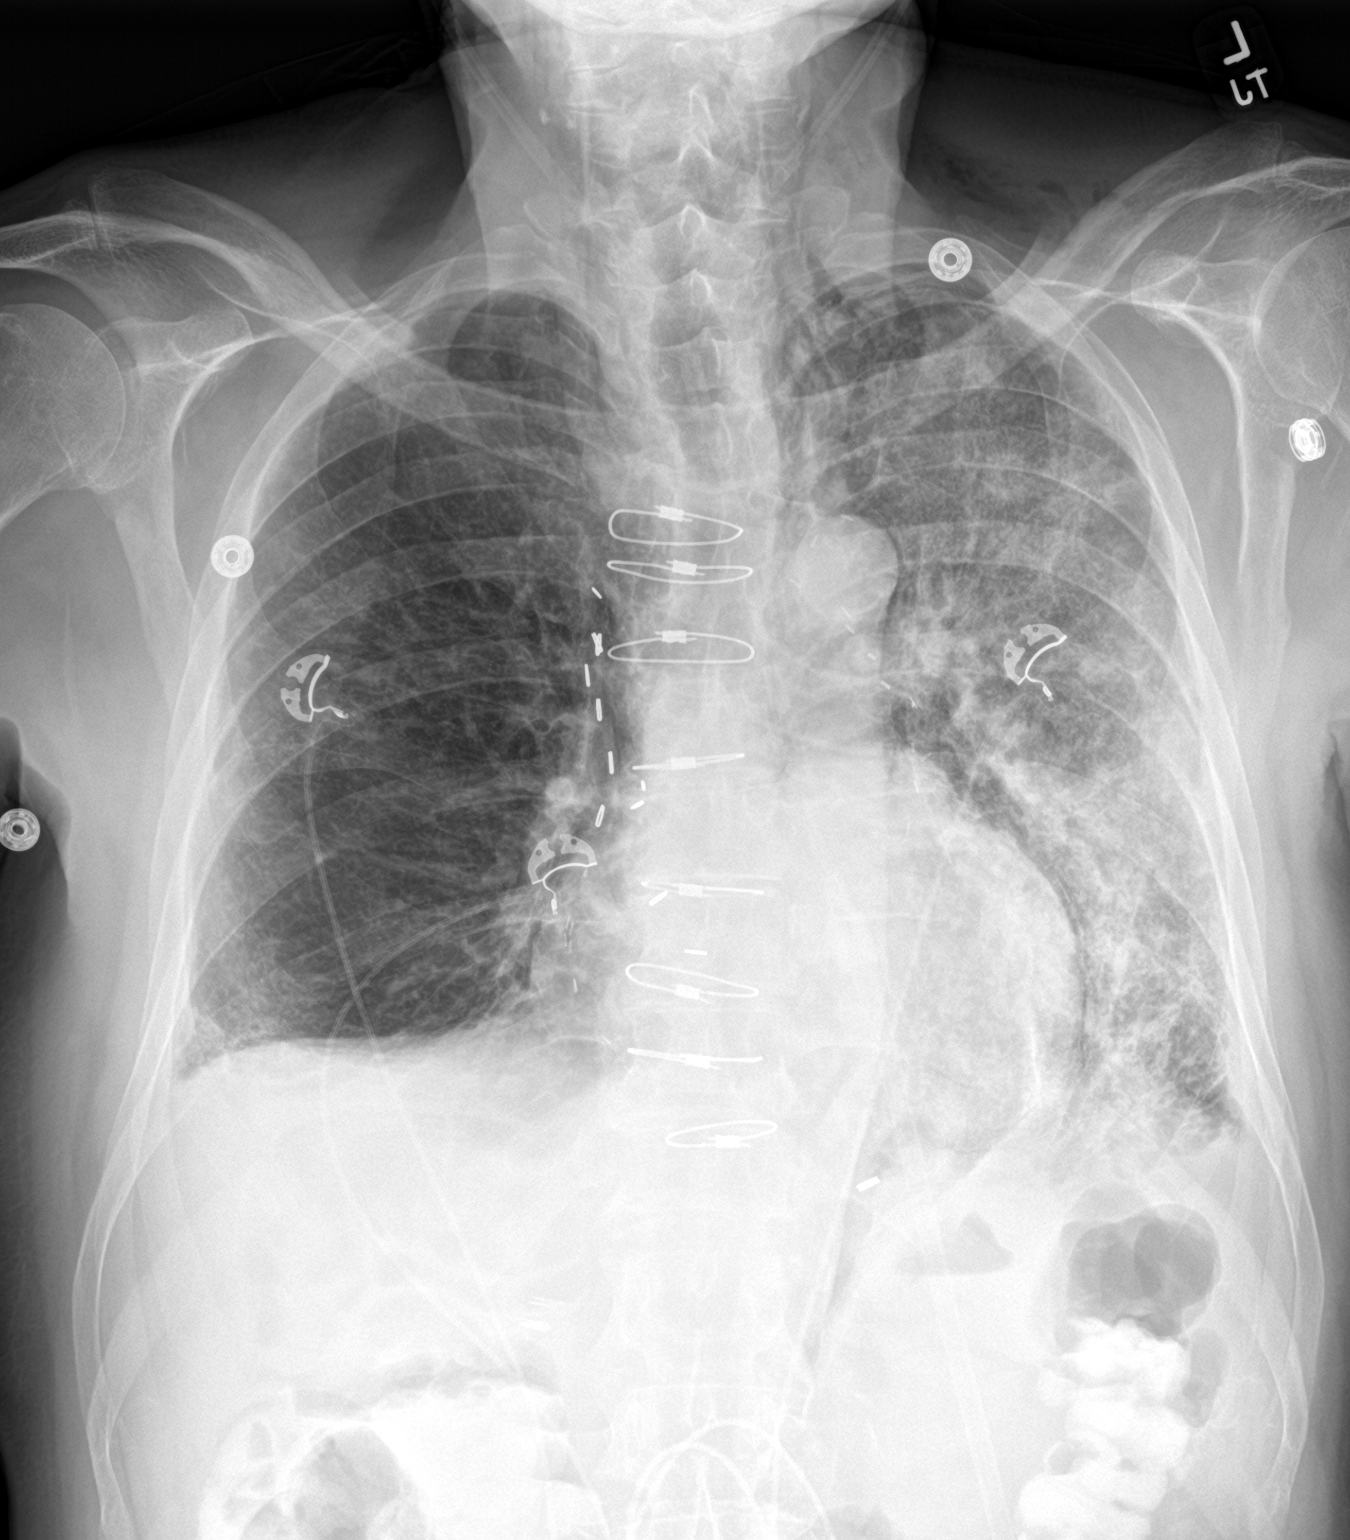

[chest lat]
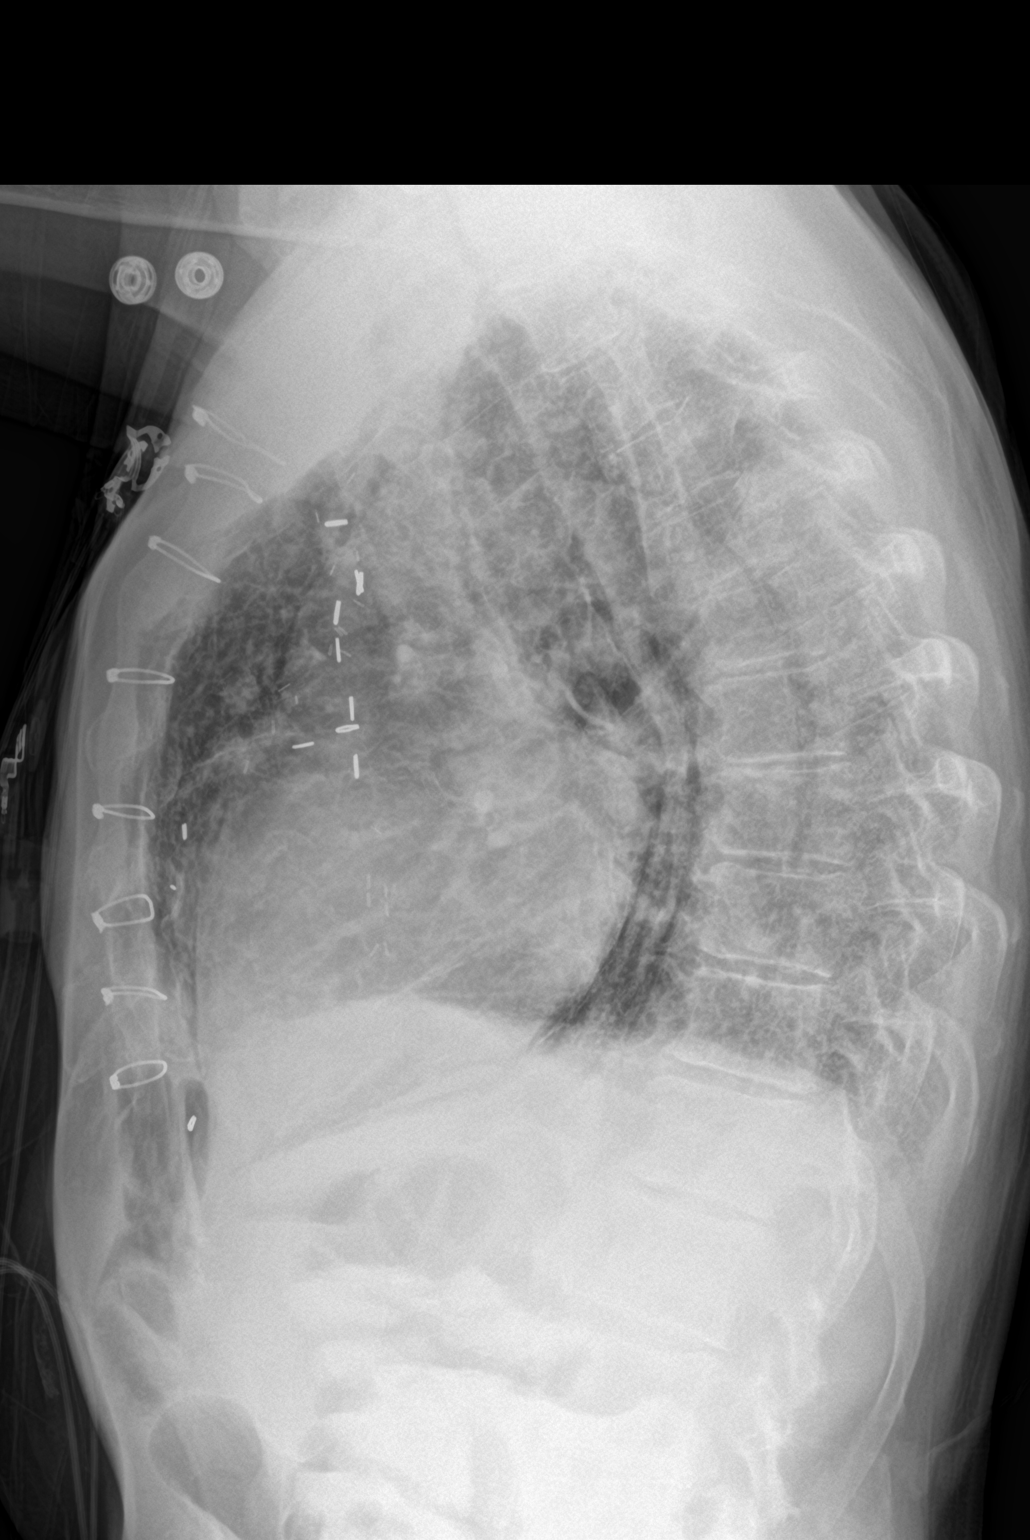

[2 of 2 positions shown; findings below may reference images not displayed]

FINDINGS: Stable cardiomediastinal contours. Pneumomediastinum is again noted
bilaterally. Gas is noted tracking into the lower neck as before.
Unchanged asymmetric opacification throughout the left lung. Mild
peripheral opacities within the right lower lung is also unchanged.
No signs of pneumothorax. Trace left pleural fluid. Unchanged.
IMPRESSION: 1. No change in the appearance of pneumomediastinum compared with
recent chest CT from 07/10/2021.
2. No change in aeration to the left lung.
